# Patient Record
Sex: Male | Born: 1940 | Race: Black or African American | Hispanic: No | Smoking: Never smoker
Health system: Southern US, Community
[De-identification: ages and names within clinical notes are randomized; demographics above are authoritative.]

## PROBLEM LIST (undated history)

## (undated) DIAGNOSIS — N186 End stage renal disease: Secondary | ICD-10-CM

## (undated) DIAGNOSIS — D696 Thrombocytopenia, unspecified: Secondary | ICD-10-CM

## (undated) DIAGNOSIS — I10 Essential (primary) hypertension: Secondary | ICD-10-CM

## (undated) DIAGNOSIS — E119 Type 2 diabetes mellitus without complications: Secondary | ICD-10-CM

## (undated) DIAGNOSIS — C801 Malignant (primary) neoplasm, unspecified: Secondary | ICD-10-CM

## (undated) DIAGNOSIS — N2889 Other specified disorders of kidney and ureter: Secondary | ICD-10-CM

## (undated) DIAGNOSIS — N2581 Secondary hyperparathyroidism of renal origin: Secondary | ICD-10-CM

## (undated) DIAGNOSIS — G4730 Sleep apnea, unspecified: Secondary | ICD-10-CM

## (undated) DIAGNOSIS — M109 Gout, unspecified: Secondary | ICD-10-CM

## (undated) DIAGNOSIS — E782 Mixed hyperlipidemia: Secondary | ICD-10-CM

## (undated) DIAGNOSIS — D649 Anemia, unspecified: Secondary | ICD-10-CM

## (undated) DIAGNOSIS — M1990 Unspecified osteoarthritis, unspecified site: Secondary | ICD-10-CM

## (undated) DIAGNOSIS — E876 Hypokalemia: Secondary | ICD-10-CM

## (undated) DIAGNOSIS — K219 Gastro-esophageal reflux disease without esophagitis: Secondary | ICD-10-CM

## (undated) DIAGNOSIS — N189 Chronic kidney disease, unspecified: Secondary | ICD-10-CM

## (undated) HISTORY — PX: OTHER SURGICAL HISTORY: SHX169

## (undated) HISTORY — DX: Unspecified osteoarthritis, unspecified site: M19.90

## (undated) HISTORY — PX: EYE SURGERY: SHX253

## (undated) HISTORY — PX: APPENDECTOMY: SHX54

## (undated) HISTORY — PX: RETINAL DETACHMENT SURGERY: SHX105

## (undated) HISTORY — PX: TRANSURETHRAL RESECTION OF PROSTATE: SHX73

## (undated) HISTORY — PX: HERNIA REPAIR: SHX51

---

## 2004-10-12 ENCOUNTER — Ambulatory Visit: Payer: Self-pay

## 2005-02-08 ENCOUNTER — Ambulatory Visit: Payer: Self-pay | Admitting: Cardiology

## 2006-09-20 ENCOUNTER — Other Ambulatory Visit: Payer: Self-pay

## 2006-09-26 ENCOUNTER — Ambulatory Visit: Payer: Self-pay | Admitting: Podiatry

## 2008-09-01 ENCOUNTER — Ambulatory Visit: Payer: Self-pay | Admitting: Internal Medicine

## 2008-09-29 ENCOUNTER — Ambulatory Visit: Payer: Self-pay | Admitting: Internal Medicine

## 2010-05-06 ENCOUNTER — Ambulatory Visit: Payer: Self-pay | Admitting: Gastroenterology

## 2010-05-09 LAB — PATHOLOGY REPORT

## 2012-06-06 ENCOUNTER — Ambulatory Visit: Payer: Self-pay | Admitting: Family Medicine

## 2012-10-11 ENCOUNTER — Inpatient Hospital Stay: Payer: Self-pay | Admitting: Surgery

## 2012-10-11 LAB — CBC
MCHC: 32.7 g/dL (ref 32.0–36.0)
MCV: 90 fL (ref 80–100)
Platelet: 107 10*3/uL — ABNORMAL LOW (ref 150–440)
RDW: 15.2 % — ABNORMAL HIGH (ref 11.5–14.5)
WBC: 8.9 10*3/uL (ref 3.8–10.6)

## 2012-10-11 LAB — URINALYSIS, COMPLETE
Bilirubin,UR: NEGATIVE
Glucose,UR: NEGATIVE mg/dL (ref 0–75)
Hyaline Cast: 5
Leukocyte Esterase: NEGATIVE
Ph: 5 (ref 4.5–8.0)
RBC,UR: 5 /HPF (ref 0–5)
Specific Gravity: 1.018 (ref 1.003–1.030)
Squamous Epithelial: 1
WBC UR: 4 /HPF (ref 0–5)

## 2012-10-11 LAB — COMPREHENSIVE METABOLIC PANEL
Albumin: 3.1 g/dL — ABNORMAL LOW (ref 3.4–5.0)
BUN: 38 mg/dL — ABNORMAL HIGH (ref 7–18)
Chloride: 105 mmol/L (ref 98–107)
Co2: 23 mmol/L (ref 21–32)
Creatinine: 3.37 mg/dL — ABNORMAL HIGH (ref 0.60–1.30)
EGFR (African American): 20 — ABNORMAL LOW
Potassium: 4.3 mmol/L (ref 3.5–5.1)
SGPT (ALT): 13 U/L (ref 12–78)
Total Protein: 8.3 g/dL — ABNORMAL HIGH (ref 6.4–8.2)

## 2012-10-11 LAB — LIPASE, BLOOD: Lipase: 84 U/L (ref 73–393)

## 2012-10-12 LAB — BASIC METABOLIC PANEL
BUN: 40 mg/dL — ABNORMAL HIGH (ref 7–18)
Creatinine: 2.84 mg/dL — ABNORMAL HIGH (ref 0.60–1.30)
EGFR (African American): 25 — ABNORMAL LOW
EGFR (Non-African Amer.): 21 — ABNORMAL LOW
Osmolality: 292 (ref 275–301)
Potassium: 4.4 mmol/L (ref 3.5–5.1)

## 2012-10-12 LAB — CBC WITH DIFFERENTIAL/PLATELET
Basophil %: 0.1 %
Eosinophil #: 0 10*3/uL (ref 0.0–0.7)
Eosinophil %: 0 %
HCT: 33.2 % — ABNORMAL LOW (ref 40.0–52.0)
MCH: 28.5 pg (ref 26.0–34.0)
MCHC: 31.5 g/dL — ABNORMAL LOW (ref 32.0–36.0)
MCV: 91 fL (ref 80–100)
Monocyte %: 5.2 %
Neutrophil #: 5.4 10*3/uL (ref 1.4–6.5)
Neutrophil %: 92.8 %
Platelet: 88 10*3/uL — ABNORMAL LOW (ref 150–440)
RBC: 3.67 10*6/uL — ABNORMAL LOW (ref 4.40–5.90)
RDW: 15.1 % — ABNORMAL HIGH (ref 11.5–14.5)
WBC: 5.8 10*3/uL (ref 3.8–10.6)

## 2012-10-12 LAB — MAGNESIUM: Magnesium: 1.3 mg/dL — ABNORMAL LOW

## 2012-10-13 LAB — BASIC METABOLIC PANEL
Anion Gap: 11 (ref 7–16)
BUN: 41 mg/dL — ABNORMAL HIGH (ref 7–18)
Chloride: 109 mmol/L — ABNORMAL HIGH (ref 98–107)
Co2: 19 mmol/L — ABNORMAL LOW (ref 21–32)
EGFR (African American): 26 — ABNORMAL LOW
EGFR (Non-African Amer.): 23 — ABNORMAL LOW
Glucose: 214 mg/dL — ABNORMAL HIGH (ref 65–99)
Osmolality: 294 (ref 275–301)
Potassium: 4.6 mmol/L (ref 3.5–5.1)

## 2012-10-13 LAB — MAGNESIUM: Magnesium: 1.5 mg/dL — ABNORMAL LOW

## 2012-10-13 LAB — CBC WITH DIFFERENTIAL/PLATELET
Basophil #: 0 10*3/uL (ref 0.0–0.1)
Eosinophil %: 0.1 %
MCH: 29.2 pg (ref 26.0–34.0)
MCHC: 32.5 g/dL (ref 32.0–36.0)
MCV: 90 fL (ref 80–100)
Monocyte #: 0.4 x10 3/mm (ref 0.2–1.0)
Platelet: 96 10*3/uL — ABNORMAL LOW (ref 150–440)
WBC: 8.1 10*3/uL (ref 3.8–10.6)

## 2012-10-14 LAB — COMPREHENSIVE METABOLIC PANEL
Alkaline Phosphatase: 84 U/L (ref 50–136)
Anion Gap: 10 (ref 7–16)
BUN: 44 mg/dL — ABNORMAL HIGH (ref 7–18)
Chloride: 111 mmol/L — ABNORMAL HIGH (ref 98–107)
Co2: 20 mmol/L — ABNORMAL LOW (ref 21–32)
Creatinine: 2.54 mg/dL — ABNORMAL HIGH (ref 0.60–1.30)
SGOT(AST): 15 U/L (ref 15–37)

## 2012-10-14 LAB — CBC WITH DIFFERENTIAL/PLATELET
HGB: 10.9 g/dL — ABNORMAL LOW (ref 13.0–18.0)
Lymphocyte #: 0.3 10*3/uL — ABNORMAL LOW (ref 1.0–3.6)
Lymphocyte %: 2.8 %
MCH: 28.2 pg (ref 26.0–34.0)
MCHC: 31.6 g/dL — ABNORMAL LOW (ref 32.0–36.0)
RDW: 15.5 % — ABNORMAL HIGH (ref 11.5–14.5)

## 2012-10-15 LAB — PATHOLOGY REPORT

## 2012-10-17 LAB — CULTURE, BLOOD (SINGLE)

## 2012-12-10 ENCOUNTER — Ambulatory Visit: Payer: Self-pay | Admitting: Internal Medicine

## 2012-12-10 LAB — CBC CANCER CENTER
HCT: 29.6 % — ABNORMAL LOW (ref 40.0–52.0)
HGB: 9.8 g/dL — ABNORMAL LOW (ref 13.0–18.0)
Lymphocytes: 23 %
MCH: 28.2 pg (ref 26.0–34.0)
MCHC: 33 g/dL (ref 32.0–36.0)
MCV: 86 fL (ref 80–100)
Monocytes: 5 %
Platelet: 157 x10 3/mm (ref 150–440)
RBC: 3.45 10*6/uL — ABNORMAL LOW (ref 4.40–5.90)

## 2012-12-10 LAB — IRON AND TIBC
Iron Bind.Cap.(Total): 277 ug/dL (ref 250–450)
Iron Saturation: 21 %
Unbound Iron-Bind.Cap.: 218 ug/dL

## 2012-12-10 LAB — LACTATE DEHYDROGENASE: LDH: 126 U/L (ref 85–241)

## 2012-12-10 LAB — RETICULOCYTES: Reticulocyte: 1.56 %

## 2012-12-10 LAB — FOLATE: Folic Acid: 10.3 ng/mL (ref 3.1–100.0)

## 2012-12-10 LAB — FERRITIN: Ferritin (ARMC): 520 ng/mL — ABNORMAL HIGH (ref 8–388)

## 2012-12-11 LAB — PROT IMMUNOELECTROPHORES(ARMC)

## 2012-12-19 ENCOUNTER — Ambulatory Visit: Payer: Self-pay | Admitting: Internal Medicine

## 2013-01-19 ENCOUNTER — Ambulatory Visit: Payer: Self-pay | Admitting: Internal Medicine

## 2013-03-21 ENCOUNTER — Ambulatory Visit: Payer: Self-pay | Admitting: Internal Medicine

## 2013-03-28 LAB — CBC CANCER CENTER
Basophil %: 0.9 %
Eosinophil #: 0.5 x10 3/mm (ref 0.0–0.7)
Eosinophil %: 7.7 %
HCT: 32.6 % — ABNORMAL LOW (ref 40.0–52.0)
HGB: 11.2 g/dL — ABNORMAL LOW (ref 13.0–18.0)
Lymphocyte #: 1.2 x10 3/mm (ref 1.0–3.6)
Lymphocyte %: 19 %
MCHC: 34.4 g/dL (ref 32.0–36.0)
MCV: 89 fL (ref 80–100)
Monocyte #: 0.6 x10 3/mm (ref 0.2–1.0)
Monocyte %: 9.1 %
Neutrophil %: 63.3 %
RBC: 3.66 10*6/uL — ABNORMAL LOW (ref 4.40–5.90)
RDW: 13.9 % (ref 11.5–14.5)
WBC: 6.2 x10 3/mm (ref 3.8–10.6)

## 2013-04-21 ENCOUNTER — Ambulatory Visit: Payer: Self-pay | Admitting: Internal Medicine

## 2013-06-27 ENCOUNTER — Ambulatory Visit: Payer: Self-pay | Admitting: Oncology

## 2013-06-27 LAB — CBC CANCER CENTER
Basophil #: 0 x10 3/mm (ref 0.0–0.1)
Eosinophil #: 0.3 x10 3/mm (ref 0.0–0.7)
Eosinophil %: 4.3 %
Lymphocyte %: 18.7 %
MCH: 29.8 pg (ref 26.0–34.0)
MCHC: 32.9 g/dL (ref 32.0–36.0)
MCV: 91 fL (ref 80–100)
Neutrophil #: 4.3 x10 3/mm (ref 1.4–6.5)
RBC: 3.4 10*6/uL — ABNORMAL LOW (ref 4.40–5.90)
RDW: 14.4 % (ref 11.5–14.5)

## 2013-07-21 ENCOUNTER — Ambulatory Visit: Payer: Self-pay | Admitting: Oncology

## 2013-09-25 ENCOUNTER — Ambulatory Visit: Payer: Self-pay | Admitting: Internal Medicine

## 2013-09-26 LAB — CBC CANCER CENTER
Basophil #: 0 x10 3/mm (ref 0.0–0.1)
Basophil %: 0.5 %
Eosinophil #: 0.3 x10 3/mm (ref 0.0–0.7)
Eosinophil %: 4 %
HCT: 33.1 % — ABNORMAL LOW (ref 40.0–52.0)
HGB: 10.8 g/dL — AB (ref 13.0–18.0)
LYMPHS ABS: 1.2 x10 3/mm (ref 1.0–3.6)
Lymphocyte %: 19.1 %
MCH: 29.2 pg (ref 26.0–34.0)
MCHC: 32.7 g/dL (ref 32.0–36.0)
MCV: 89 fL (ref 80–100)
MONO ABS: 0.5 x10 3/mm (ref 0.2–1.0)
Monocyte %: 8.6 %
Neutrophil #: 4.2 x10 3/mm (ref 1.4–6.5)
Neutrophil %: 67.8 %
Platelet: 118 x10 3/mm — ABNORMAL LOW (ref 150–440)
RBC: 3.72 10*6/uL — AB (ref 4.40–5.90)
RDW: 14.1 % (ref 11.5–14.5)
WBC: 6.2 x10 3/mm (ref 3.8–10.6)

## 2013-09-26 LAB — FERRITIN: Ferritin (ARMC): 307 ng/mL (ref 8–388)

## 2013-09-26 LAB — IRON AND TIBC
IRON BIND. CAP.(TOTAL): 261 ug/dL (ref 250–450)
IRON SATURATION: 30 %
IRON: 79 ug/dL (ref 65–175)
Unbound Iron-Bind.Cap.: 182 ug/dL

## 2013-10-19 ENCOUNTER — Ambulatory Visit: Payer: Self-pay | Admitting: Internal Medicine

## 2013-12-25 ENCOUNTER — Ambulatory Visit: Payer: Self-pay | Admitting: Internal Medicine

## 2013-12-26 LAB — CBC CANCER CENTER
Basophil #: 0.1 x10 3/mm (ref 0.0–0.1)
Basophil %: 0.9 %
EOS PCT: 7.8 %
Eosinophil #: 0.5 x10 3/mm (ref 0.0–0.7)
HCT: 32.5 % — ABNORMAL LOW (ref 40.0–52.0)
HGB: 10.7 g/dL — AB (ref 13.0–18.0)
LYMPHS ABS: 1.2 x10 3/mm (ref 1.0–3.6)
LYMPHS PCT: 18.3 %
MCH: 29.6 pg (ref 26.0–34.0)
MCHC: 32.9 g/dL (ref 32.0–36.0)
MCV: 90 fL (ref 80–100)
MONOS PCT: 8.9 %
Monocyte #: 0.6 x10 3/mm (ref 0.2–1.0)
Neutrophil #: 4.2 x10 3/mm (ref 1.4–6.5)
Neutrophil %: 64.1 %
Platelet: 142 x10 3/mm — ABNORMAL LOW (ref 150–440)
RBC: 3.61 10*6/uL — ABNORMAL LOW (ref 4.40–5.90)
RDW: 13.9 % (ref 11.5–14.5)
WBC: 6.6 x10 3/mm (ref 3.8–10.6)

## 2013-12-26 LAB — IRON AND TIBC
Iron Bind.Cap.(Total): 234 ug/dL — ABNORMAL LOW (ref 250–450)
Iron Saturation: 33 %
Iron: 77 ug/dL (ref 65–175)
UNBOUND IRON-BIND. CAP.: 157 ug/dL

## 2013-12-26 LAB — FERRITIN: Ferritin (ARMC): 286 ng/mL (ref 8–388)

## 2014-01-19 ENCOUNTER — Ambulatory Visit: Payer: Self-pay | Admitting: Internal Medicine

## 2014-12-11 NOTE — Discharge Summary (Signed)
PATIENT NAME:  [name] [name], [name] [name] MR#:  [id number] DATE OF BIRTH:  01/13/1941  DATE OF ADMISSION:  10/11/2012 DATE OF DISCHARGE:  10/14/2012  DICTATING PHYSICIAN: Harrell Gave A. Lundquist, MD  DISCHARGE DIAGNOSES:  1. Ruptured appendicitis status post laparoscopic appendectomy.  2. History of acute on chronic renal failure.  3. History of umbilical hernia repair.  4. History of transurethral resection of prostate.  5. History of left retinal detachment surgery.  6. History of hypertension.  7. History of morbid obesity.  8. History of gout.  9. History of dyslipidemia.  10. Adult onset diabetes mellitus.  11. History of obstructive sleep apnea.  12. History of pelvic kidney.   DISCHARGE MEDICATIONS: As follows: 1. Metoprolol 50 mg p.o. b.i.d.  2. Glimepiride 2 mg p.o. daily.  3. Dutasteride 0.5 mg p.o. daily.  4. Aspirin 81 p.o. daily. 5. Norvasc 10 mg p.o. daily.  6. Loratadine 10 mg p.o. daily.  7. Lovastatin 20 mg p.o. at bedtime.  8. Vitamin D 2000 units p.o. daily.  9. Febuxostat 40 mg p.o. daily.  10. Victoza 1.8 mcg subcutaneous p.r.n. 11. Vicodin 5/325 mg p.o. q.4 hours  12. Cipro 500 mg p.o. b.i.d.  13. Metronidazole 500 mg p.o. q.8 hours.   INDICATION FOR ADMISSION: As follows: The patient is a pleasant 74 year old male who presented with I believe 4 days of right lower quadrant pain, had a leukocytosis and a CT scan which showed a perforated appendicitis. Thus, he was admitted for management of that.   HOSPITAL COURSE: As follows: The patient was brought to the operating room suite. Laparoscopic appendectomy was performed. Postoperatively, the patient was made n.p.o. As he became hungry, his diet was advanced from clears to regular diet. He also was initially made n.p.o. and given IV fluids. He was again on sliding scale insulin postoperatively and was advanced to his oral hypoglycemics as his diet improved. He also was given initially IV antibiotics, and at the time of  discharge, was tolerating regular diet with good p.o. pain control and was on oral antibiotics.   DISCHARGE INSTRUCTIONS: As follows: The patient is to follow up in approximately 1 week with myself or my colleagues at [facility]. He is to call or return to the ED if has increased pain, nausea, vomiting, redness or drainage from incision.     ____________________________ Glena Norfolk Lundquist, MD cal:OSi D: 10/24/2012 10:50:36 ET T: 10/24/2012 11:13:49 ET JOB#: [id number]  cc: Harrell Gave A. Lundquist, MD, <Dictator> Floyde Parkins MD ELECTRONICALLY SIGNED 10/26/2012 11:09

## 2014-12-11 NOTE — Consult Note (Signed)
PATIENT NAME:  [name] [name], [name] [name] MR#:  [id number] DATE OF BIRTH:  26-Mar-1941  DATE OF CONSULTATION:  10/12/2012  REFERRING PHYSICIAN:  Dr. Molly Maduro  CONSULTING PHYSICIAN:  Ceasar Lund. Anselm Jungling, MD  PRIMARY CARE PHYSICIAN:  Dr. Netty Starring with [facility].    REASON FOR THE MEDICAL CONSULTATION:  Management of diabetes, hypertension, and medical issues.   CHIEF COMPLAINT:  Status post appendectomy.   HISTORY OF PRESENT ILLNESS:  The patient is a 74 year old male with a past medical history of hypertension, hyperlipidemia, diabetes and chronic renal failure was in his usual state of health up to 3 to 4 days ago.  Since then he started having right lower quadrant abdominal pain which was gradually worsening and so severe that he had to come to the Emergency Room yesterday. CT scan of the abdomen and pelvis, which was done in the ER, was finding consistent with appendicitis with rupture.  Surgical consultation recommended.  There is evidence of fluid associated with the finding without definite evidence for focal loculated fluid collection to suggest abscess.  Indeterminate masses within patient's belly and left kidney as described above, likely reactive small bowel ileus.  After having this finding on the CAT scan, patient was admitted for surgical service and he underwent laparoscopic appendectomy today.  Medical consult is called in.  The patient denies any complaint other than mild soreness of the abdomen.    REVIEW OF SYSTEMS:  CONSTITUTIONAL:  Negative for fever, fatigue. Having severe abdominal pain for the last 2 to 3 days, but denies any weight loss or weight gain.  EYES:  No blurring or double vision.  No discharge or redness.  EARS:  No tinnitus, hearing loss.  RESPIRATORY:  No wheezing, shortness of breath or cough.  CARDIOVASCULAR:  No palpitations, chest pain, dizziness or edema of the legs.  ABDOMEN:  Mild abdominal pain, status post laparoscopic appendectomy.  No  diarrhea or vomiting.  SKIN:  No rashes.  JOINTS:  No swelling or pain.  NEUROLOGICAL:  Denies any tingling, numbness, weakness or headache.  PSYCHIATRIC:  Denies any depression, bipolar or insomnia.   PAST MEDICAL HISTORY:  Hypertension, diabetes, hyperlipidemia, gout and chronic renal failure.   PAST SURGICAL HISTORY:  Hernia repair surgery, left toe amputation.   SOCIAL HISTORY:  Denies smoking, drinking alcohol or any drug use.  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   FAMILY HISTORY:  Mother had diabetes.  Father and brother both had leukemia.   HOME MEDICATIONS:  Vitamin D3 2000 international units once a day, Victoza 1.8 mcg subcutaneous once a day as needed, metoprolol 50 mg oral tablet 2 times a day, lovastatin 20 mg oral tablet once a day, loratadine 10 mg oral tablet once a day, glimepiride 4 mg oral tablet tame 0.5 tablet orally once a day, febuxostat 40 mg oral once a day, Enalapril 20 mg oral tablet once a day, budesonide 0.5 mg oral tablet once a day, aspirin enteric-coated 81 mg once a day, amlodipine 10 mg oral tablet once a day.  PHYSICAL EXAMINATION: VITAL SIGNS:  Temperature 98 degrees, pulse 102, respirations 20, blood pressure 144/80 and pulse ox 97% at 1 liter nasal cannula oxygen supplementation.  GENERAL:  The patient is completely alert, oriented to time, place and person.  Does not appear in any acute distress.  He is cooperative with history-taking and physical examination.  HEENT:  Head and neck atraumatic.  Conjunctivae pink.  Oral mucosa moist.  RESPIRATORY:  Bilateral clear and equal air entry.  CARDIOVASCULAR:  S1, S2 present, regular.  No murmur appreciated.  ABDOMEN:  Small dressings present at 3 sites for laparoscopic appendectomy surgery.  Mild tenderness.  No organomegaly.  Bowel sounds present.  SKIN:  No rashes.  LYMPHATIC:  No edema.  NEUROLOGICAL:  Moves all four limbs.  Power 5 out of 5.  No tingling or numbness.   PSYCHIATRIC:  Does not appear in any acute psychiatric illness.     LABORATORY AND IMAGING DATA:  CT scan of the abdomen as mentioned above.  Other lab results:  Glucose 245, BUN 40, creatinine 2.84, which was 3.37 on admission.  Sodium 137, potassium 4.4, chloride 107, bicarb 19, calcium 8.1, magnesium 1.3.  WBC 5.8, hemoglobin 10.5, platelet count 88, MCV 91.  Blood cultures, no growth.  Urinalysis grossly negative with 4 WBCs present in the urine.   ASSESSMENT AND PLAN:  The patient is a 74 year old male with past medical history of hypertension, diabetes, hyperlipidemia and chronic renal failure with pelvic kidney came with acute appendicitis status post appendectomy.  Medical consult for medical issue management.  1.  Diabetes.  The patient is currently nothing by mouth status post surgery.  We will monitor with sliding insulin scale coverage and we may start the home medication once patient starts eating in 1 to 2 days.  2.  Hypertension.  He is on amlodipine, metoprolol.  We held Enalapril due to renal failure.  We will check creatinine in the morning and follow blood pressure.  3.  Hyperlipidemia.  We will continue statin.  4.  Chronic renal failure.  The patient had one pelvic kidney and kidney has some masses also.  We do not have previous renal function for comparison.  We may need to check it.  I spoke to [facility] doctor.  They might have this patient's records from the past and that will help.  Meanwhile, we will recheck the kidney function tomorrow morning.  5.  Hypomagnesemia.  We will replace and recheck in the morning.   CODE STATUS:  FULL CODE.     Primary care physician, Dr. Netty Starring of [facility].  I endorsed the case to Dr. Doy Hutching who is covering for [facility] tonight.   TOTAL TIME SPENT:  50 minutes.    ____________________________ Ceasar Lund Anselm Jungling, MD vgv:ea D: 10/12/2012 23:10:04 ET T: 10/13/2012 02:34:10 ET JOB#: [id number]  cc: Ceasar Lund. Anselm Jungling,  MD, <Dictator> Vaughan Basta MD ELECTRONICALLY SIGNED 10/20/2012 23:06

## 2014-12-11 NOTE — Op Note (Signed)
PATIENT NAME:  [name] [name], [name] [name] MR#:  [id number] DATE OF BIRTH:  [birth year]  DATE OF PROCEDURE:  10/11/2012  PREOPERATIVE DIAGNOSIS: Ruptured appendicitis.   POSTOPERATIVE DIAGNOSIS:  Acute appendicitis rupture.   PROCEDURE PERFORMED:  Laparoscopic appendectomy.   ESTIMATED BLOOD LOSS: 25 mL.   COMPLICATIONS: None.   SPECIMEN: Appendix.   INDICATION FOR SURGERY: The patient is a pleasant 74 year old male who presented with 5 days of right lower quadrant pain and a CT scan which showed ruptured appendix with intra-abdominal phlegmon. He was brought to the operating room for laparoscopic appendectomy.   DETAILS OF PROCEDURE: Informed consent was obtained. The patient brought to the operating room suite. He was induced. Endotracheal tube was placed. General anesthesia was administered. His abdomen was prepped and draped in standard surgical fashion. An upper midline incision was made above his area where there is some firmness likely corresponding to his previous hernia repair. This brought down to the fascia. The fascia was grasped. Fascia was incised. The peritoneum was entered. There were no intraperitoneal adhesions. 2-0 Vicryl stay sutures were placed. Hasson trocar was placed in the abdomen. The abdomen was insufflated. There was a large amount of inflammation with multiple adhesions between adjacent loops of small bowel as well as free purulence. I then placed a left lower quadrant 5 mm trocar and brought the adhesions off the abdominal wall. I encountered multiple pockets of purulence. I then placed a lower abdomen midline trocar 5 mm again and then able to find the appendix. It was severely inflamed, ruptured, not quite at the base, approximately 1 cm from the base. I then placed a right lower quadrant trocar and using a combination of 3 trocars made a hole at the base of the appendix and fired a stapler across the base of the appendix. It was flush with the cecum. I then used a combination of  cautery and  endoscopic staplers to divide mesoappendix. The appendix was then brought out through an Endo Catch bag through the supraumbilical port. I then irrigated the abdomen with 3 to 4 liters of normal saline until everything was clear. I then examined the mesentery as well as the appendiceal stump, both were hemostatic. I then removed my trocars under direct visualization. The supraumbilical Hasson trocar fascia was closed with a figure-of-eight 0 Vicryl suture. The skin was then closed with interrupted deep dermal 4-0 Monocryl. Steri-Strips were placed across the wounds and then Telfa and Tegaderm were used to complete the dressing. The patient was then awoken, extubated and brought to the postanesthesia care unit. There were no immediate complications. Needle, sponge and instrument counts were correct at the end of the procedure.   ____________________________ Glena Norfolk. Briscoe Daniello, MD cal:ja D: 10/11/2012 [id number] ET T: 10/12/2012 11:33:04 ET JOB#: [id number]  cc: Harrell Gave A. Twain Stenseth, MD, <Dictator> Floyde Parkins MD ELECTRONICALLY SIGNED 10/15/2012 8:17

## 2014-12-11 NOTE — H&P (Signed)
Subjective/Chief Complaint progressively worsening abdominal pain   History of Present Illness 71 yobm began having abdominal pain Sunday (5 days ago). It has gotten progressively worse. No fever. Had some nausea one time and vomitied once last PM. Has been eating normally except yesterday and today, when his appetite has decreased. Drinking fluids normally and normal urinary volume by Hx. Urine has turned darker.   Past History HTN CRI (Cr 2.0 this month, followed by [facility]) [place] Hx Gout Dyslipidemia AODM OSA Erectile dysfunction Pelvic (Right) Kidney  s/p UHR - doesn't know about mesh s/p TURP s/p L retinal detachment surgery   ALLERGIES:  No Known Allergies:    Other Allergies cardura    Medications meds not entered for med rec - therefore not done yet - on 11 meds - no anti-coagulants or anti-platelet agents   Family and Social History:  Family History Non-Contributory   Social History negative tobacco, negative ETOH, negative Illicit drugs, married, retired Paediatric nurse of [employer]:  Fever/Chills No   Cough No   Sputum No   Abdominal Pain Yes   Diarrhea No   Constipation No   Nausea/Vomiting Yes   SOB/DOE No   Chest Pain No   Dysuria No   Tolerating PT Yes   Tolerating Diet Yes  Nauseated  Vomiting   Physical Exam:  GEN well developed, obese   HEENT pink conjunctivae, PERRL, hearing intact to voice, moist oral mucosa, Oropharynx clear   NECK supple  thyroid not tender  trachea midline   RESP normal resp effort  clear BS  no use of accessory muscles   CARD regular rate  no murmur  no JVD  no Rub   ABD positive tenderness  exquisite generalized tenderness with rebound and guarding - seems worse on Right   LYMPH negative neck   EXTR negative edema   SKIN normal to palpation, skin turgor good   NEURO cranial nerves intact, negative tremor, follows commands, strength:, motor/sensory function intact    PSYCH alert, A+O to time, place, person, poor insight   Lab Results: Hepatic:  21-Feb-14 14:21   Bilirubin, Total 0.7  Alkaline Phosphatase 62  SGPT (ALT) 13  SGOT (AST)  14  Total Protein, Serum  8.3  Albumin, Serum  3.1  Routine Chem:  21-Feb-14 14:21   Lipase 84 (Result(s) reported on 11 Oct 2012 at 02:50PM.)  Glucose, Serum  257  BUN  38  Creatinine (comp)  3.37  Sodium, Serum 138  Potassium, Serum 4.3  Chloride, Serum 105  CO2, Serum 23  Calcium (Total), Serum 9.2  Osmolality (calc) 294  eGFR (African American)  20  eGFR (Non-African American)  17 (eGFR values <39m/min/1.73 m2 may be an indication of chronic kidney disease (CKD). Calculated eGFR is useful in patients with stable renal function. The eGFR calculation will not be reliable in acutely ill patients when serum creatinine is changing rapidly. It is not useful in  patients on dialysis. The eGFR calculation may not be applicable to patients at the low and high extremes of body sizes, pregnant women, and vegetarians.)  Anion Gap 10  Routine Hem:  21-Feb-14 14:21   WBC (CBC) 8.9  RBC (CBC)  4.10  Hemoglobin (CBC)  12.1  Hematocrit (CBC)  36.9  Platelet Count (CBC)  107 (Result(s) reported on 11 Oct 2012 at 02:46PM.)  MCV 90  MCH 29.4  MCHC 32.7  RDW  15.2   Radiology Results: LabUnknown:  21-Feb-14 16:04, CT Abdomen and Pelvis Without Contrast  PACS Image  CT:  CT Abdomen and Pelvis Without Contrast  REASON FOR EXAM:    (1) severe abdominal pain; (2) severe abdominal pian  COMMENTS:   May transport without cardiac monitor    PROCEDURE: CT  - CT ABDOMEN AND PELVIS W0  - Oct 11 2012  4:04PM     RESULT: CT abdomen pelvis dated 10/11/2012.    Technique: Helical 3 mm sections were obtained from lung bases through   the pubic symphysis status post ministration of oral contrast.    Findings: Areas of increased density project within the lung bases.        Noncontrast evaluation of the liver,  spleen, adrenals, pancreas are     unremarkable. Evaluation of the left kidney demonstrates 3.2 cm masslike   area along the posterior midpole region of the kidney this area is   incompletely characterized on this study demonstrates a Hounsfield units   of 17and may represent a cyst. Further characterization with triphasic   CT and/or MRI [place] ultrasound is recommended. Is no evidence of   abdominal aortic aneurysm.    Evaluation retrocolic gutter region demonstrates fluid within the   paracolic gutterwithout evidence associated of loculated fluid   collections. Fluid is appreciated tracking along the anterior lateral   abdominal wall . A blind-ending tubular focus with small punctate areas   of air is appreciated along the anterior lower abdomen.This has the   appearance of the appendix and measures 0.46 cm in diameter. Within the   proximal portion of this focus a rounded laminated calcified density is   appreciated suspicious for an appendicolith. Small foci of air project     either an adjacent to this structure or possibly along the nondependent   wall of this structure. There is small punctate foci of air within the   surrounding fluid and these findings are concerning for a ruptured   appendix until proven otherwise. There are inflammatory change within the   surrounding mesenteric fat.    A pelvic kidney is identified within the central pelvis. A masslike area   projects laterally within the renal cortex and may simply ribs and a   dromedary hump. A mass cannot be excluded no further evaluation with MRI   the help to further characterize this finding. A second area is   identified along the lower pole region.    IMPRESSION:  Findings consistent with appendicitis as well as rupture.   Surgical consultation recommended. Dr. Reita Cliche of the emergency proximal   these findings at the time of the initial interpretation. There is     evidence of fluid associated with this finding  without definite evidence   of a focal loculated fluid collection to suggest abscess.  2. Indeterminate masses within the patient's pelvic kidney and left   kidney as described above further evaluation recommended.  2. Likely reactive small bowel ileus.        Verified By: Mikki Santee, M.D., MD    Assessment/Admission Diagnosis Ruptured viscus with generalized peritonitis - likely appendix. No fever or WBC, but tachycardic and Cr worse than baseline (3.3 instead of 2.0)   Plan IVF hydration, Ceftriaxone and Clindamycin  Appendectomy and washout   Electronic Signatures: Consuela Mimes (MD)  (Signed 21-Feb-14 17:58)  Authored: CHIEF COMPLAINT and HISTORY, ALLERGIES, Other Allergies, OTHER MEDICATIONS, FAMILY AND SOCIAL HISTORY, REVIEW OF SYSTEMS, PHYSICAL EXAM, LABS, Radiology, ASSESSMENT AND PLAN   Last  Updated: 21-Feb-14 17:58 by Consuela Mimes (MD)

## 2015-06-11 ENCOUNTER — Encounter: Payer: Self-pay | Admitting: *Deleted

## 2015-06-14 ENCOUNTER — Ambulatory Visit: Payer: Medicare Other | Admitting: Certified Registered Nurse Anesthetist

## 2015-06-14 ENCOUNTER — Ambulatory Visit
Admission: RE | Admit: 2015-06-14 | Discharge: 2015-06-14 | Disposition: A | Discharge status: Home / Self Care | Payer: Medicare Other | Source: Ambulatory Visit | Attending: Gastroenterology | Admitting: Gastroenterology

## 2015-06-14 ENCOUNTER — Encounter
Admission: RE | Disposition: A | Discharge status: Home / Self Care | Payer: Self-pay | Source: Ambulatory Visit | Attending: Gastroenterology

## 2015-06-14 ENCOUNTER — Encounter: Payer: Self-pay | Admitting: *Deleted

## 2015-06-14 DIAGNOSIS — N184 Chronic kidney disease, stage 4 (severe): Secondary | ICD-10-CM | POA: Diagnosis not present

## 2015-06-14 DIAGNOSIS — G4730 Sleep apnea, unspecified: Secondary | ICD-10-CM | POA: Insufficient documentation

## 2015-06-14 DIAGNOSIS — M109 Gout, unspecified: Secondary | ICD-10-CM | POA: Insufficient documentation

## 2015-06-14 DIAGNOSIS — Z7982 Long term (current) use of aspirin: Secondary | ICD-10-CM | POA: Insufficient documentation

## 2015-06-14 DIAGNOSIS — E1122 Type 2 diabetes mellitus with diabetic chronic kidney disease: Secondary | ICD-10-CM | POA: Diagnosis not present

## 2015-06-14 DIAGNOSIS — Z79899 Other long term (current) drug therapy: Secondary | ICD-10-CM | POA: Diagnosis not present

## 2015-06-14 DIAGNOSIS — I129 Hypertensive chronic kidney disease with stage 1 through stage 4 chronic kidney disease, or unspecified chronic kidney disease: Secondary | ICD-10-CM | POA: Diagnosis not present

## 2015-06-14 DIAGNOSIS — Z888 Allergy status to other drugs, medicaments and biological substances status: Secondary | ICD-10-CM | POA: Insufficient documentation

## 2015-06-14 DIAGNOSIS — K429 Umbilical hernia without obstruction or gangrene: Secondary | ICD-10-CM | POA: Diagnosis not present

## 2015-06-14 DIAGNOSIS — Z86010 Personal history of colonic polyps: Secondary | ICD-10-CM | POA: Insufficient documentation

## 2015-06-14 DIAGNOSIS — K5730 Diverticulosis of large intestine without perforation or abscess without bleeding: Secondary | ICD-10-CM | POA: Diagnosis not present

## 2015-06-14 DIAGNOSIS — Z1211 Encounter for screening for malignant neoplasm of colon: Secondary | ICD-10-CM | POA: Diagnosis present

## 2015-06-14 DIAGNOSIS — E782 Mixed hyperlipidemia: Secondary | ICD-10-CM | POA: Insufficient documentation

## 2015-06-14 DIAGNOSIS — D124 Benign neoplasm of descending colon: Secondary | ICD-10-CM | POA: Diagnosis not present

## 2015-06-14 HISTORY — DX: Anemia, unspecified: D64.9

## 2015-06-14 HISTORY — DX: Essential (primary) hypertension: I10

## 2015-06-14 HISTORY — PX: COLONOSCOPY WITH PROPOFOL: SHX5780

## 2015-06-14 HISTORY — DX: Gout, unspecified: M10.9

## 2015-06-14 HISTORY — DX: Mixed hyperlipidemia: E78.2

## 2015-06-14 HISTORY — DX: Type 2 diabetes mellitus without complications: E11.9

## 2015-06-14 HISTORY — DX: Sleep apnea, unspecified: G47.30

## 2015-06-14 HISTORY — DX: Chronic kidney disease, unspecified: N18.9

## 2015-06-14 LAB — GLUCOSE, CAPILLARY: Glucose-Capillary: 107 mg/dL — ABNORMAL HIGH (ref 65–99)

## 2015-06-14 SURGERY — COLONOSCOPY WITH PROPOFOL
Anesthesia: General

## 2015-06-14 MED ORDER — SODIUM CHLORIDE 0.9 % IV SOLN
INTRAVENOUS | Status: DC
  Administered 2015-06-14: 11:00:00 via INTRAVENOUS

## 2015-06-14 MED ORDER — PROPOFOL 10 MG/ML IV BOLUS
INTRAVENOUS | Status: DC | PRN
  Administered 2015-06-14: 50 mg via INTRAVENOUS

## 2015-06-14 MED ORDER — FENTANYL CITRATE (PF) 100 MCG/2ML IJ SOLN
INTRAMUSCULAR | Status: DC | PRN
  Administered 2015-06-14: 50 ug via INTRAVENOUS

## 2015-06-14 MED ORDER — LIDOCAINE HCL (CARDIAC) 20 MG/ML IV SOLN
INTRAVENOUS | Status: DC | PRN
Start: 2015-06-14 — End: 2015-06-14
  Administered 2015-06-14: 50 mg via INTRAVENOUS

## 2015-06-14 MED ORDER — PROPOFOL 500 MG/50ML IV EMUL
INTRAVENOUS | Status: DC | PRN
  Administered 2015-06-14: 150 ug/kg/min via INTRAVENOUS

## 2015-06-14 NOTE — Anesthesia Preprocedure Evaluation (Signed)
Anesthesia Evaluation  Patient identified by MRN, date of birth, ID band Patient awake    Reviewed: Allergy & Precautions, H&P , NPO status , Patient's Chart, lab work & pertinent test results, reviewed documented beta blocker date and time   History of Anesthesia Complications Negative for: history of anesthetic complications  Airway Mallampati: IV  TM Distance: >3 FB Neck ROM: full    Dental no notable dental hx. (+) Teeth Intact   Pulmonary neg shortness of breath, sleep apnea , neg COPD, neg recent URI,    Pulmonary exam normal breath sounds clear to auscultation       Cardiovascular Exercise Tolerance: Good hypertension, On Medications (-) angina(-) CAD, (-) Past MI, (-) Cardiac Stents and (-) CABG Normal cardiovascular exam(-) dysrhythmias (-) Valvular Problems/Murmurs Rhythm:regular Rate:Normal     Neuro/Psych negative neurological ROS  negative psych ROS   GI/Hepatic Neg liver ROS, GERD  Controlled,  Endo/Other  negative endocrine ROSdiabetes, Oral Hypoglycemic Agents  Renal/GU CRFRenal disease  negative genitourinary   Musculoskeletal   Abdominal   Peds  Hematology  (+) Blood dyscrasia, anemia ,   Anesthesia Other Findings Past Medical History:   Anemia                                                       Diabetes mellitus without complication (HCC)                 Chronic kidney disease                                         Comment:Stage 4 CKD   Gout                                                         Sleep apnea                                                  Hypertension                                                 Hyperlipidemia, mixed                                        Reproductive/Obstetrics negative OB ROS                             Anesthesia Physical Anesthesia Plan  ASA: III  Anesthesia Plan: General   Post-op Pain Management:    Induction:    Airway Management Planned:   Additional Equipment:   Intra-op Plan:   Post-operative Plan:   Informed Consent: I have reviewed the  patients History and Physical, chart, labs and discussed the procedure including the risks, benefits and alternatives for the proposed anesthesia with the patient or authorized representative who has indicated his/her understanding and acceptance.   Dental Advisory Given  Plan Discussed with: Anesthesiologist, CRNA and Surgeon  Anesthesia Plan Comments:         Anesthesia Quick Evaluation

## 2015-06-14 NOTE — Transfer of Care (Signed)
Immediate Anesthesia Transfer of Care Note  Patient: [name] [name]  Procedure(s) Performed: Procedure(s): COLONOSCOPY WITH PROPOFOL (N/A)  Patient Location: PACU  Anesthesia Type:General  Level of Consciousness: awake, alert , oriented and patient cooperative  Airway & Oxygen Therapy: Patient Spontanous Breathing and Patient connected to nasal cannula oxygen  Post-op Assessment: Report given to RN and Post -op Vital signs reviewed and stable  Post vital signs: Reviewed and stable  Last Vitals:  Filed Vitals:   06/14/15 1206  BP: 116/69  Pulse: 77  Temp: 36 C  Resp: 12    Complications: No apparent anesthesia complications

## 2015-06-14 NOTE — H&P (Signed)
Outpatient short stay form Pre-procedure 06/14/2015 11:20 AM [name] Sails MD  Primary Physician: Dr  [name] [name]  Reason for visit:  Colonoscopy  History of present illness:  Patient is a 74 year old male presenting today for a colonoscopy. His prep well. He takes an 81 mg aspirin no other aspirin products, and no blood thinners. He stopped his aspirin about a week ago. He has a personal history of adenomatous colon polyps.    Current facility-administered medications:  .  0.9 %  sodium chloride infusion, , Intravenous, Continuous, [name] Sails, MD  Prescriptions prior to admission  Medication Sig Dispense Refill Last Dose  . amLODipine (NORVASC) 10 MG tablet Take 10 mg by mouth daily.   06/13/2015 at Unknown time  . aspirin EC 81 MG tablet Take 81 mg by mouth daily.   06/07/2015  . Cholecalciferol 2000 UNITS CAPS Take 2,000 Units by mouth daily.   Past Week at Unknown time  . enalapril (VASOTEC) 20 MG tablet Take 20 mg by mouth daily.   06/13/2015 at Unknown time  . febuxostat (ULORIC) 40 MG tablet Take 40 mg by mouth daily.   06/13/2015 at Unknown time  . finasteride (PROSCAR) 5 MG tablet Take 5 mg by mouth daily.   06/13/2015 at Unknown time  . glimepiride (AMARYL) 4 MG tablet Take 2 mg by mouth daily with breakfast.   06/13/2015 at Unknown time  . hydrALAZINE (APRESOLINE) 100 MG tablet Take 100 mg by mouth 3 (three) times daily.   06/13/2015 at Unknown time  . Liraglutide (VICTOZA [place]) Inject 1.8 mg into the skin daily.   06/13/2015 at Unknown time  . loratadine (CLARITIN) 10 MG tablet Take 10 mg by mouth daily.   Past Week at Unknown time  . lovastatin (MEVACOR) 20 MG tablet Take 20 mg by mouth at bedtime.   06/13/2015 at Unknown time  . metoprolol (LOPRESSOR) 50 MG tablet Take 50 mg by mouth 2 (two) times daily.   06/14/2015 at 0700     Allergies  Allergen Reactions  . Cardura [Doxazosin]      Past Medical History  Diagnosis Date  . Anemia   . Diabetes  mellitus without complication ([place])   . Chronic kidney disease     Stage 4 CKD  . Gout   . Sleep apnea   . Hypertension   . Hyperlipidemia, mixed     Review of systems:      Physical Exam    Heart and lungs: Regular rate and rhythm without rub or gallop, lungs are bilaterally clear    HEENT: Normocephalic atraumatic eyes are anicteric    Other:     Pertinant exam for procedure: Soft protuberant nontender nondistended bowel sounds positive normoactive. There is a midline periumbilical hernia.    Planned proceedures: Colonoscopy and indicated procedures I have discussed the risks benefits and complications of procedures to include not limited to bleeding, infection, perforation and the risk of sedation and the patient wishes to proceed.    [name] Sails, MD Gastroenterology 06/14/2015  11:20 AM

## 2015-06-14 NOTE — Op Note (Signed)
[facility]astroenterology Patient Name: [name] [name] Procedure Date: 06/14/2015 11:36 AM MRN: [id number] Account #: [id number] Date of Birth: [birth year] Admit Type: Outpatient Age: 74 Room: [facility] ENDO ROOM 3 Gender: Male Note Status: Finalized Procedure:         Colonoscopy Indications:       Personal history of colonic polyps Providers:         Lollie Sails, MD Referring MD:      Dion Body (Referring MD) Medicines:         Monitored Anesthesia Care Complications:     No immediate complications. Procedure:         Pre-Anesthesia Assessment:                    - ASA Grade Assessment: III - A patient with severe                     systemic disease.                    After obtaining informed consent, the colonoscope was                     passed under direct vision. Throughout the procedure, the                     patient's blood pressure, pulse, and oxygen saturations                     were monitored continuously. The Colonoscope was                     introduced through the anus and advanced to the the cecum,                     identified by appendiceal orifice and ileocecal valve. The                     colonoscopy was performed without difficulty. The patient                     tolerated the procedure well. The quality of the bowel                     preparation was good. Findings:      A 2 mm polyp was found in the descending colon. The polyp was sessile.       The polyp was removed with a cold biopsy forceps. Resection and       retrieval were complete.      Multiple small to medium diverticula were found in the sigmoid colon, in       the descending colon and in the transverse colon.      Biopsies for histology were taken with a cold forceps from the right       colon and left colon for evaluation of microscopic colitis.      A patchy area of mildly friable (with spontaneous bleeding) mucosa was       found in the proximal  transverse colon, in the ascending colon, in the       proximal ascending colon and in the cecum, possible mucosal-associated       barotrauma. Biopsies were taken with a cold forceps for histology.       Biopsies for histology were taken with a cold forceps  from the right       colon and left colon for evaluation of microscopic colitis.      The digital rectal exam was normal. Impression:        - One 2 mm polyp in the descending colon. Resected and                     retrieved.                    - Diverticulosis in the sigmoid colon, in the descending                     colon and in the transverse colon.                    Dionisio David (with spontaneous bleeding) mucosa in the                     proximal transverse colon, in the ascending colon, in the                     proximal ascending colon and in the cecum. Biopsied.                    - Biopsies were taken with a cold forceps from the right                     colon and left colon for evaluation of microscopic colitis. Recommendation:    - Discharge patient to home.                    - Await pathology results.                    - Telephone GI clinic for pathology results in 1 week. Procedure Code(s): --- Professional ---                    [id number], Colonoscopy, flexible; with biopsy, single or                     multiple Diagnosis Code(s): --- Professional ---                    211.3, Benign neoplasm of colon                    578.9, Hemorrhage of gastrointestinal tract, unspecified                    V12.72, Personal history of colonic polyps                    562.10, Diverticulosis of colon (without mention of                     hemorrhage) CPT copyright 2014 American Medical Association. All rights reserved. The codes documented in this report are preliminary and upon coder review may  be revised to meet current compliance requirements. Lollie Sails, MD 06/14/2015 12:03:58 PM This report has been signed  electronically. Number of Addenda: 0 Note Initiated On: 06/14/2015 11:36 AM Scope Withdrawal Time: 0 hours 10 minutes 42 seconds  Total Procedure Duration: 0 hours 16 minutes 8 seconds       [facility]

## 2015-06-15 NOTE — Anesthesia Postprocedure Evaluation (Signed)
  Anesthesia Post-op Note  Patient: [name] [name]  Procedure(s) Performed: Procedure(s): COLONOSCOPY WITH PROPOFOL (N/A)  Anesthesia type:General  Patient location: PACU  Post pain: Pain level controlled  Post assessment: Post-op Vital signs reviewed, Patient's Cardiovascular Status Stable, Respiratory Function Stable, Patent Airway and No signs of Nausea or vomiting  Post vital signs: Reviewed and stable  Last Vitals:  Filed Vitals:   06/14/15 1230  BP: 143/67  Pulse: 66  Temp:   Resp: 14    Level of consciousness: awake, alert  and patient cooperative  Complications: No apparent anesthesia complications

## 2015-06-16 LAB — SURGICAL PATHOLOGY

## 2015-06-17 ENCOUNTER — Encounter: Payer: Self-pay | Admitting: Gastroenterology

## 2015-08-11 ENCOUNTER — Other Ambulatory Visit: Payer: Self-pay | Admitting: Urology

## 2015-08-11 ENCOUNTER — Other Ambulatory Visit (HOSPITAL_COMMUNITY): Payer: Self-pay | Admitting: Urology

## 2015-08-11 DIAGNOSIS — R972 Elevated prostate specific antigen [PSA]: Secondary | ICD-10-CM

## 2015-09-01 ENCOUNTER — Ambulatory Visit (HOSPITAL_COMMUNITY)
Admission: RE | Admit: 2015-09-01 | Discharge: 2015-09-01 | Disposition: A | Discharge status: Home / Self Care | Payer: Medicare Other | Source: Ambulatory Visit | Attending: Urology | Admitting: Urology

## 2015-09-01 ENCOUNTER — Other Ambulatory Visit (HOSPITAL_COMMUNITY): Payer: Self-pay | Admitting: Urology

## 2015-09-01 DIAGNOSIS — N429 Disorder of prostate, unspecified: Secondary | ICD-10-CM | POA: Diagnosis not present

## 2015-09-01 DIAGNOSIS — R972 Elevated prostate specific antigen [PSA]: Secondary | ICD-10-CM

## 2015-09-01 LAB — POCT I-STAT CREATININE: CREATININE: 2.6 mg/dL — AB (ref 0.61–1.24)

## 2016-05-07 DIAGNOSIS — N184 Chronic kidney disease, stage 4 (severe): Secondary | ICD-10-CM | POA: Insufficient documentation

## 2016-05-07 DIAGNOSIS — N189 Chronic kidney disease, unspecified: Secondary | ICD-10-CM

## 2016-05-07 DIAGNOSIS — D631 Anemia in chronic kidney disease: Secondary | ICD-10-CM

## 2016-05-07 DIAGNOSIS — D696 Thrombocytopenia, unspecified: Secondary | ICD-10-CM | POA: Insufficient documentation

## 2016-05-07 NOTE — Progress Notes (Signed)
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  INTERVAL HISTORY: Patient is a 75 year old male with a long-standing history of chronic renal insufficiency. He previously required Procrit injections, but reports not having any for several years. Routine blood work revealed a declining hemoglobin. Patient also states he is more weak and fatigued than usual. He otherwise feels well. He has no neurologic complaints. He denies any recent fevers or illnesses. He has a good appetite and denies weight loss. He denies any chest pain or shortness of breath. He denies any nausea, vomiting, constipation, or diarrhea. He denies any melena or hematochezia.  He has no urinary complaints. Patient otherwise feels well and offers no further specific complaints.  REVIEW OF SYSTEMS:   Review of Systems  Constitutional: Positive for malaise/fatigue. Negative for fever and weight loss.  Respiratory: Negative.  Negative for cough and shortness of breath.   Cardiovascular: Negative.  Negative for chest pain and leg swelling.  Gastrointestinal: Negative.  Negative for abdominal pain, blood in stool and melena.  Genitourinary: Negative.   Musculoskeletal: Negative.   Neurological: Positive for weakness.  Psychiatric/Behavioral: Negative.  The patient is not nervous/anxious.     As per HPI. Otherwise, a complete review of systems is negative.  PAST MEDICAL HISTORY: Past Medical History:  Diagnosis Date  . Anemia   . Arthritis   . Chronic kidney disease    Stage 4 CKD  . Diabetes mellitus without complication ([place])   . Gout   . Hyperlipidemia, mixed   . Hypertension   . Sleep apnea     PAST SURGICAL HISTORY: Past Surgical History:  Procedure  Laterality Date  . APPENDECTOMY    . Bunion Removal Right   . COLONOSCOPY WITH PROPOFOL N/A 06/14/2015   Procedure: COLONOSCOPY WITH PROPOFOL;  Surgeon: Lollie Sails, MD;  Location: [facility] ENDOSCOPY;  Service: Endoscopy;  Laterality: N/A;  . HERNIA REPAIR    . RETINAL DETACHMENT SURGERY Left   . TRANSURETHRAL RESECTION OF PROSTATE      FAMILY HISTORY: Family History  Problem Relation Age of Onset  . Leukemia Father   . Leukemia Brother     ADVANCED DIRECTIVES (Y/N):  N  HEALTH MAINTENANCE: Social History  Substance Use Topics  . Smoking status: Never Smoker  . Smokeless tobacco: Never Used  . Alcohol use Yes     Colonoscopy:  PAP:  Bone density:  Lipid panel:  Allergies  Allergen Reactions  . Cardura [Doxazosin] Other (See Comments)  . Other Swelling    Fish- facial swelling    Current Outpatient Prescriptions  Medication Sig Dispense Refill  . amLODipine (NORVASC) 10 MG tablet Take 10 mg by mouth daily.    Marland Kitchen aspirin EC 81 MG tablet Take 81 mg by mouth daily.    . Cholecalciferol 2000 UNITS CAPS Take 2,000 Units by mouth daily.    . enalapril (VASOTEC) 20 MG tablet Take 20 mg by mouth daily.    . finasteride (PROSCAR) 5 MG tablet Take 5 mg by mouth daily.    Marland Kitchen glimepiride (AMARYL) 4 MG tablet Take 2 mg by mouth daily with breakfast.    . hydrALAZINE (APRESOLINE) 100 MG tablet Take 100 mg by mouth 3 (three) times daily.    Marland Kitchen loratadine (CLARITIN) 10 MG tablet Take 10  mg by mouth daily.    Marland Kitchen lovastatin (MEVACOR) 20 MG tablet Take 20 mg by mouth at bedtime.    . metoprolol (LOPRESSOR) 50 MG tablet Take 50 mg by mouth 2 (two) times daily.     No current facility-administered medications for this visit.     OBJECTIVE: Vitals:   05/08/16 1052 05/08/16 1101  BP: (!) 185/83 (!) 181/77  Pulse: (!) 58 (!) 54  Resp: 18   Temp: (!) 95.4 F (35.2 C)      Body mass index is 37.31 kg/m.    ECOG FS:1 - Symptomatic but completely ambulatory  General:  Well-developed, well-nourished, no acute distress. Eyes: Pink conjunctiva, anicteric sclera. HEENT: Normocephalic, moist mucous membranes, clear oropharnyx. Lungs: Clear to auscultation bilaterally. Heart: Regular rate and rhythm. No rubs, murmurs, or gallops. Abdomen: Soft, nontender, nondistended. No organomegaly noted, normoactive bowel sounds. Musculoskeletal: No edema, cyanosis, or clubbing. Neuro: Alert, answering all questions appropriately. Cranial nerves grossly intact. Skin: No rashes or petechiae noted. Psych: Normal affect. Lymphatics: No cervical, calvicular, axillary or inguinal LAD.   LAB RESULTS:  Lab Results  Component Value Date   NA 141 10/14/2012   K 4.0 10/14/2012   CL 111 (H) 10/14/2012   CO2 20 (L) 10/14/2012   GLUCOSE 171 (H) 10/14/2012   BUN 44 (H) 10/14/2012   CREATININE 2.60 (H) 09/01/2015   CALCIUM 9.2 10/14/2012   PROT 7.3 10/14/2012   ALBUMIN 2.4 (L) 10/14/2012   AST 15 10/14/2012   ALT 15 10/14/2012   ALKPHOS 84 10/14/2012   BILITOT 0.3 10/14/2012   GFRNONAA 25 (L) 10/14/2012   GFRAA 29 (L) 10/14/2012    Lab Results  Component Value Date   WBC 6.5 05/08/2016   NEUTROABS 4.2 12/26/2013   HGB 10.2 (L) 05/08/2016   HCT 30.3 (L) 05/08/2016   MCV 90.3 05/08/2016   PLT 130 (L) 05/08/2016     STUDIES: No results found.  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  PLAN:    1. 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 The remainder of his laboratory work including iron stores is pending at time of dictation.  Currently, his hemoglobin is greater than 10.0 therefore he does not require additional Procrit today. Return to clinic monthly for repeat laboratory work and consideration of 40,000 units Procrit if his hemoglobin falls below 10.0. Patient will then return to clinic in 4 months for further evaluation. nemia:  Likely secondary to patient's chronic renal insufficiency. 2. Thrombocytopenia: Mild and relatively unchanged. The remainder of his laboratory work is pending from today. No intervention is needed at this time. Return to clinic as above for further evaluation and discussion of his laboratory results.  Patient expressed understanding and was in agreement with this plan. He also understands that He can call clinic at any time with any questions, concerns, or complaints.   Lloyd Huger, MD   05/08/2016 12:33 PM

## 2016-05-08 ENCOUNTER — Inpatient Hospital Stay: Payer: Medicare Other | Attending: Oncology | Admitting: Oncology

## 2016-05-08 ENCOUNTER — Encounter (INDEPENDENT_AMBULATORY_CARE_PROVIDER_SITE_OTHER): Payer: Self-pay

## 2016-05-08 ENCOUNTER — Inpatient Hospital Stay: Payer: Medicare Other

## 2016-05-08 ENCOUNTER — Encounter: Payer: Self-pay | Admitting: Oncology

## 2016-05-08 VITALS — BP 181/77 | HR 54 | Temp 95.4°F | Resp 18 | Ht 69.0 in | Wt 252.6 lb

## 2016-05-08 DIAGNOSIS — N184 Chronic kidney disease, stage 4 (severe): Secondary | ICD-10-CM | POA: Insufficient documentation

## 2016-05-08 DIAGNOSIS — G4730 Sleep apnea, unspecified: Secondary | ICD-10-CM | POA: Insufficient documentation

## 2016-05-08 DIAGNOSIS — M109 Gout, unspecified: Secondary | ICD-10-CM | POA: Diagnosis not present

## 2016-05-08 DIAGNOSIS — E785 Hyperlipidemia, unspecified: Secondary | ICD-10-CM | POA: Insufficient documentation

## 2016-05-08 DIAGNOSIS — I10 Essential (primary) hypertension: Secondary | ICD-10-CM | POA: Diagnosis not present

## 2016-05-08 DIAGNOSIS — D696 Thrombocytopenia, unspecified: Secondary | ICD-10-CM

## 2016-05-08 DIAGNOSIS — Z7984 Long term (current) use of oral hypoglycemic drugs: Secondary | ICD-10-CM | POA: Diagnosis not present

## 2016-05-08 DIAGNOSIS — D631 Anemia in chronic kidney disease: Secondary | ICD-10-CM | POA: Diagnosis not present

## 2016-05-08 DIAGNOSIS — N183 Chronic kidney disease, stage 3 unspecified: Secondary | ICD-10-CM

## 2016-05-08 DIAGNOSIS — Z7982 Long term (current) use of aspirin: Secondary | ICD-10-CM | POA: Insufficient documentation

## 2016-05-08 DIAGNOSIS — M129 Arthropathy, unspecified: Secondary | ICD-10-CM | POA: Diagnosis not present

## 2016-05-08 DIAGNOSIS — I129 Hypertensive chronic kidney disease with stage 1 through stage 4 chronic kidney disease, or unspecified chronic kidney disease: Secondary | ICD-10-CM | POA: Insufficient documentation

## 2016-05-08 DIAGNOSIS — Z79899 Other long term (current) drug therapy: Secondary | ICD-10-CM | POA: Diagnosis not present

## 2016-05-08 DIAGNOSIS — Z806 Family history of leukemia: Secondary | ICD-10-CM | POA: Insufficient documentation

## 2016-05-08 DIAGNOSIS — R5383 Other fatigue: Secondary | ICD-10-CM | POA: Diagnosis not present

## 2016-05-08 DIAGNOSIS — E119 Type 2 diabetes mellitus without complications: Secondary | ICD-10-CM | POA: Diagnosis not present

## 2016-05-08 DIAGNOSIS — R531 Weakness: Secondary | ICD-10-CM | POA: Diagnosis not present

## 2016-05-08 LAB — IRON AND TIBC
IRON: 60 ug/dL (ref 45–182)
Saturation Ratios: 22 % (ref 17.9–39.5)
TIBC: 268 ug/dL (ref 250–450)
UIBC: 208 ug/dL

## 2016-05-08 LAB — LACTATE DEHYDROGENASE: LDH: 143 U/L (ref 98–192)

## 2016-05-08 LAB — CBC
HEMATOCRIT: 30.3 % — AB (ref 40.0–52.0)
Hemoglobin: 10.2 g/dL — ABNORMAL LOW (ref 13.0–18.0)
MCH: 30.3 pg (ref 26.0–34.0)
MCHC: 33.5 g/dL (ref 32.0–36.0)
MCV: 90.3 fL (ref 80.0–100.0)
PLATELETS: 130 10*3/uL — AB (ref 150–440)
RBC: 3.35 MIL/uL — AB (ref 4.40–5.90)
RDW: 14.4 % (ref 11.5–14.5)
WBC: 6.5 10*3/uL (ref 3.8–10.6)

## 2016-05-08 LAB — FERRITIN: FERRITIN: 227 ng/mL (ref 24–336)

## 2016-05-08 LAB — FOLATE: Folate: 15.9 ng/mL (ref >5.9)

## 2016-05-08 LAB — VITAMIN B12: VITAMIN B 12: 194 pg/mL (ref 180–914)

## 2016-05-08 NOTE — Progress Notes (Signed)
Patient here today as new evaluation regarding anemia/thrombocytopenia.  Referred by Dr. Holley Raring.  Patient states he is "tired and run down feeling".  Also has a lot of "nagging headaches".  Also states he has frequent "dizzy spells".   Has SOB with exertion.  Family history of leukemia - father, brother and sister.

## 2016-05-09 LAB — PROTEIN ELECTROPHORESIS, SERUM
A/G RATIO SPE: 1.1 (ref 0.7–1.7)
Albumin ELP: 3.8 g/dL (ref 2.9–4.4)
Alpha-1-Globulin: 0.2 g/dL (ref 0.0–0.4)
Alpha-2-Globulin: 0.6 g/dL (ref 0.4–1.0)
BETA GLOBULIN: 1.1 g/dL (ref 0.7–1.3)
GAMMA GLOBULIN: 1.6 g/dL (ref 0.4–1.8)
Globulin, Total: 3.5 g/dL (ref 2.2–3.9)
Total Protein ELP: 7.3 g/dL (ref 6.0–8.5)

## 2016-05-09 LAB — PLATELET ANTIBODY PROFILE, SERUM
HLA AB SER QL EIA: NEGATIVE
IA/IIA Antibody: NEGATIVE
IB/IX Antibody: NEGATIVE
IIB/IIIA ANTIBODY: NEGATIVE

## 2016-05-09 LAB — HAPTOGLOBIN: Haptoglobin: 110 mg/dL (ref 34–200)

## 2016-05-11 ENCOUNTER — Inpatient Hospital Stay: Payer: Medicare Other

## 2016-06-02 ENCOUNTER — Other Ambulatory Visit: Payer: Self-pay | Admitting: *Deleted

## 2016-06-02 DIAGNOSIS — D696 Thrombocytopenia, unspecified: Secondary | ICD-10-CM

## 2016-06-05 ENCOUNTER — Inpatient Hospital Stay: Payer: Medicare Other | Attending: Oncology

## 2016-06-05 ENCOUNTER — Inpatient Hospital Stay: Payer: Medicare Other

## 2016-06-05 ENCOUNTER — Other Ambulatory Visit: Payer: Self-pay

## 2016-06-05 DIAGNOSIS — D696 Thrombocytopenia, unspecified: Secondary | ICD-10-CM | POA: Diagnosis not present

## 2016-06-05 LAB — CBC WITH DIFFERENTIAL/PLATELET
BASOS ABS: 0 10*3/uL (ref 0–0.1)
BASOS PCT: 1 %
EOS ABS: 0.4 10*3/uL (ref 0–0.7)
EOS PCT: 6 %
HEMATOCRIT: 30.3 % — AB (ref 40.0–52.0)
Hemoglobin: 10.4 g/dL — ABNORMAL LOW (ref 13.0–18.0)
Lymphocytes Relative: 17 %
Lymphs Abs: 1.1 10*3/uL (ref 1.0–3.6)
MCH: 31 pg (ref 26.0–34.0)
MCHC: 34.4 g/dL (ref 32.0–36.0)
MCV: 90.1 fL (ref 80.0–100.0)
MONO ABS: 0.5 10*3/uL (ref 0.2–1.0)
MONOS PCT: 8 %
NEUTROS ABS: 4.6 10*3/uL (ref 1.4–6.5)
Neutrophils Relative %: 68 %
PLATELETS: 115 10*3/uL — AB (ref 150–440)
RBC: 3.36 MIL/uL — ABNORMAL LOW (ref 4.40–5.90)
RDW: 14.2 % (ref 11.5–14.5)
WBC: 6.6 10*3/uL (ref 3.8–10.6)

## 2016-06-28 DIAGNOSIS — D693 Immune thrombocytopenic purpura: Secondary | ICD-10-CM | POA: Insufficient documentation

## 2016-07-03 ENCOUNTER — Inpatient Hospital Stay: Payer: Medicare Other

## 2016-07-03 ENCOUNTER — Inpatient Hospital Stay: Payer: Medicare Other | Attending: Oncology | Admitting: *Deleted

## 2016-07-03 ENCOUNTER — Other Ambulatory Visit: Payer: Self-pay | Admitting: *Deleted

## 2016-07-03 VITALS — BP 146/65 | HR 65

## 2016-07-03 DIAGNOSIS — I129 Hypertensive chronic kidney disease with stage 1 through stage 4 chronic kidney disease, or unspecified chronic kidney disease: Secondary | ICD-10-CM | POA: Insufficient documentation

## 2016-07-03 DIAGNOSIS — D631 Anemia in chronic kidney disease: Secondary | ICD-10-CM

## 2016-07-03 DIAGNOSIS — N183 Chronic kidney disease, stage 3 unspecified: Secondary | ICD-10-CM

## 2016-07-03 DIAGNOSIS — Z79899 Other long term (current) drug therapy: Secondary | ICD-10-CM | POA: Insufficient documentation

## 2016-07-03 DIAGNOSIS — N189 Chronic kidney disease, unspecified: Secondary | ICD-10-CM

## 2016-07-03 LAB — HEMOGLOBIN: Hemoglobin: 10 g/dL — ABNORMAL LOW (ref 13.0–18.0)

## 2016-07-03 MED ORDER — EPOETIN ALFA 40000 UNIT/ML IJ SOLN
40000.0000 [IU] | Freq: Once | INTRAMUSCULAR | Status: AC
  Administered 2016-07-03: 40000 [IU] via SUBCUTANEOUS
  Filled 2016-07-03: qty 1

## 2016-07-03 NOTE — Progress Notes (Signed)
Parameters for Procrit are hold if hemoglobin is greater than 10.  Hemoglobin today is 10.0 and patient reports symptoms increase in fatigue.  Discussed with Dr. Grayland Ormond and [place] for patient to receive Procrit injection today.

## 2016-07-31 ENCOUNTER — Inpatient Hospital Stay: Payer: Medicare Other

## 2016-07-31 ENCOUNTER — Other Ambulatory Visit: Payer: Self-pay

## 2016-07-31 ENCOUNTER — Inpatient Hospital Stay: Payer: Medicare Other | Attending: Oncology

## 2016-07-31 DIAGNOSIS — D696 Thrombocytopenia, unspecified: Secondary | ICD-10-CM | POA: Diagnosis not present

## 2016-07-31 DIAGNOSIS — D631 Anemia in chronic kidney disease: Secondary | ICD-10-CM

## 2016-07-31 DIAGNOSIS — N189 Chronic kidney disease, unspecified: Principal | ICD-10-CM

## 2016-07-31 LAB — CBC WITH DIFFERENTIAL/PLATELET
BASOS ABS: 0 10*3/uL (ref 0–0.1)
Basophils Relative: 1 %
Eosinophils Absolute: 0.1 10*3/uL (ref 0–0.7)
Eosinophils Relative: 3 %
HCT: 32.8 % — ABNORMAL LOW (ref 40.0–52.0)
Hemoglobin: 10.8 g/dL — ABNORMAL LOW (ref 13.0–18.0)
LYMPHS PCT: 22 %
Lymphs Abs: 1.2 10*3/uL (ref 1.0–3.6)
MCH: 29.7 pg (ref 26.0–34.0)
MCHC: 33 g/dL (ref 32.0–36.0)
MCV: 89.9 fL (ref 80.0–100.0)
Monocytes Absolute: 0.4 10*3/uL (ref 0.2–1.0)
Monocytes Relative: 7 %
NEUTROS ABS: 3.7 10*3/uL (ref 1.4–6.5)
Neutrophils Relative %: 67 %
PLATELETS: 129 10*3/uL — AB (ref 150–440)
RBC: 3.64 MIL/uL — AB (ref 4.40–5.90)
RDW: 15 % — AB (ref 11.5–14.5)
WBC: 5.5 10*3/uL (ref 3.8–10.6)

## 2016-08-25 ENCOUNTER — Other Ambulatory Visit: Payer: Self-pay | Admitting: *Deleted

## 2016-08-25 DIAGNOSIS — D649 Anemia, unspecified: Secondary | ICD-10-CM

## 2016-08-28 ENCOUNTER — Inpatient Hospital Stay: Payer: Medicare Other

## 2016-08-28 ENCOUNTER — Inpatient Hospital Stay: Payer: Medicare Other | Attending: Oncology | Admitting: Oncology

## 2016-08-28 VITALS — BP 158/88 | HR 68 | Temp 97.7°F | Resp 18 | Wt 250.2 lb

## 2016-08-28 DIAGNOSIS — Z7982 Long term (current) use of aspirin: Secondary | ICD-10-CM | POA: Diagnosis not present

## 2016-08-28 DIAGNOSIS — E785 Hyperlipidemia, unspecified: Secondary | ICD-10-CM | POA: Insufficient documentation

## 2016-08-28 DIAGNOSIS — G4730 Sleep apnea, unspecified: Secondary | ICD-10-CM | POA: Insufficient documentation

## 2016-08-28 DIAGNOSIS — D696 Thrombocytopenia, unspecified: Secondary | ICD-10-CM | POA: Diagnosis not present

## 2016-08-28 DIAGNOSIS — E1122 Type 2 diabetes mellitus with diabetic chronic kidney disease: Secondary | ICD-10-CM | POA: Insufficient documentation

## 2016-08-28 DIAGNOSIS — N184 Chronic kidney disease, stage 4 (severe): Secondary | ICD-10-CM | POA: Insufficient documentation

## 2016-08-28 DIAGNOSIS — M109 Gout, unspecified: Secondary | ICD-10-CM | POA: Insufficient documentation

## 2016-08-28 DIAGNOSIS — I129 Hypertensive chronic kidney disease with stage 1 through stage 4 chronic kidney disease, or unspecified chronic kidney disease: Secondary | ICD-10-CM | POA: Diagnosis not present

## 2016-08-28 DIAGNOSIS — Z9049 Acquired absence of other specified parts of digestive tract: Secondary | ICD-10-CM | POA: Insufficient documentation

## 2016-08-28 DIAGNOSIS — Z79899 Other long term (current) drug therapy: Secondary | ICD-10-CM | POA: Diagnosis not present

## 2016-08-28 DIAGNOSIS — M129 Arthropathy, unspecified: Secondary | ICD-10-CM | POA: Diagnosis not present

## 2016-08-28 DIAGNOSIS — Z806 Family history of leukemia: Secondary | ICD-10-CM | POA: Insufficient documentation

## 2016-08-28 DIAGNOSIS — D631 Anemia in chronic kidney disease: Secondary | ICD-10-CM | POA: Diagnosis not present

## 2016-08-28 DIAGNOSIS — N183 Chronic kidney disease, stage 3 (moderate): Secondary | ICD-10-CM

## 2016-08-28 DIAGNOSIS — D649 Anemia, unspecified: Secondary | ICD-10-CM

## 2016-08-28 LAB — CBC WITH DIFFERENTIAL/PLATELET
Basophils Absolute: 0.1 10*3/uL (ref 0–0.1)
Basophils Relative: 1 %
EOS PCT: 3 %
Eosinophils Absolute: 0.2 10*3/uL (ref 0–0.7)
HCT: 32.4 % — ABNORMAL LOW (ref 40.0–52.0)
Hemoglobin: 10.7 g/dL — ABNORMAL LOW (ref 13.0–18.0)
LYMPHS PCT: 20 %
Lymphs Abs: 1.3 10*3/uL (ref 1.0–3.6)
MCH: 29.5 pg (ref 26.0–34.0)
MCHC: 33 g/dL (ref 32.0–36.0)
MCV: 89.5 fL (ref 80.0–100.0)
Monocytes Absolute: 0.6 10*3/uL (ref 0.2–1.0)
Monocytes Relative: 9 %
NEUTROS ABS: 4.4 10*3/uL (ref 1.4–6.5)
Neutrophils Relative %: 67 %
PLATELETS: 119 10*3/uL — AB (ref 150–440)
RBC: 3.62 MIL/uL — AB (ref 4.40–5.90)
RDW: 14.7 % — ABNORMAL HIGH (ref 11.5–14.5)
WBC: 6.5 10*3/uL (ref 3.8–10.6)

## 2016-08-28 NOTE — Progress Notes (Signed)
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  INTERVAL HISTORY: Patient returns to clinic today for repeat laboratory work and further evaluation. He currently feels well and is asymptomatic. He does not complain of weakness or fatigue today. He has no neurologic complaints. He denies any recent fevers or illnesses. He has a good appetite and denies weight loss. He denies any chest pain or shortness of breath. He denies any nausea, vomiting, constipation, or diarrhea. He denies any melena or hematochezia.  He has no urinary complaints. Patient offers no specific complaints today.  REVIEW OF SYSTEMS:   Review of Systems  Constitutional: Negative for fever, malaise/fatigue and weight loss.  Respiratory: Negative.  Negative for cough and shortness of breath.   Cardiovascular: Negative.  Negative for chest pain and leg swelling.  Gastrointestinal: Negative.  Negative for abdominal pain, blood in stool and melena.  Genitourinary: Negative.   Musculoskeletal: Negative.   Neurological: Negative.  Negative for weakness.  Psychiatric/Behavioral: Negative.  The patient is not nervous/anxious.     As per HPI. Otherwise, a complete review of systems is negative.  PAST MEDICAL HISTORY: Past Medical History:  Diagnosis Date  . Anemia   . Arthritis   . Chronic kidney disease    Stage 4 CKD  . Diabetes mellitus without complication ([place])   . Gout   . Hyperlipidemia, mixed   . Hypertension   . Sleep apnea     PAST SURGICAL HISTORY: Past Surgical History:  Procedure Laterality Date  . APPENDECTOMY    . Bunion Removal Right   . COLONOSCOPY WITH PROPOFOL N/A 06/14/2015   Procedure: COLONOSCOPY WITH PROPOFOL;  Surgeon: Lollie Sails, MD;  Location: [facility] ENDOSCOPY;  Service: Endoscopy;  Laterality: N/A;  . HERNIA REPAIR    . RETINAL DETACHMENT SURGERY Left   . TRANSURETHRAL RESECTION OF PROSTATE      FAMILY HISTORY: Family History  Problem Relation Age of Onset  . Leukemia Father   . Leukemia Brother     ADVANCED DIRECTIVES (Y/N):  N  HEALTH MAINTENANCE: Social History  Substance Use Topics  . Smoking status: Never Smoker  . Smokeless tobacco: Never Used  . Alcohol use Yes     Colonoscopy:  PAP:  Bone density:  Lipid panel:  Allergies  Allergen Reactions  . Cardura [Doxazosin] Other (See Comments)  . Other Swelling    Fish- facial swelling    Current Outpatient Prescriptions  Medication Sig Dispense Refill  . amLODipine (NORVASC) 10 MG tablet Take 10 mg by mouth daily.    Marland Kitchen aspirin EC 81 MG tablet Take 81 mg by mouth daily.    . Cholecalciferol 2000 UNITS CAPS Take 2,000 Units by mouth daily.    . enalapril (VASOTEC) 20 MG tablet Take 20 mg by mouth daily.    . finasteride (PROSCAR) 5 MG tablet Take 5 mg by mouth daily.    Marland Kitchen glimepiride (AMARYL) 4 MG tablet Take 2 mg by mouth daily with breakfast.    . hydrALAZINE (APRESOLINE) 100 MG tablet Take 100 mg by mouth 3 (three) times daily.    Marland Kitchen loratadine (CLARITIN) 10 MG tablet Take 10 mg by mouth daily.    Marland Kitchen lovastatin (MEVACOR) 20 MG tablet Take 20 mg by mouth at bedtime.    Marland Kitchen  metoprolol (LOPRESSOR) 50 MG tablet Take 50 mg by mouth 2 (two) times daily.     No current facility-administered medications for this visit.     OBJECTIVE: Vitals:   08/28/16 1103  BP: (!) 158/88  Pulse: 68  Resp: 18  Temp: 97.7 F (36.5 C)     Body mass index is 36.95 kg/m.    ECOG FS:1 - Symptomatic but completely ambulatory  General: Well-developed, well-nourished, no acute distress. Eyes: Pink conjunctiva, anicteric sclera. Lungs: Clear to auscultation bilaterally. Heart: Regular rate and rhythm. No rubs, murmurs, or gallops. Abdomen: Soft,  nontender, nondistended. No organomegaly noted, normoactive bowel sounds. Musculoskeletal: No edema, cyanosis, or clubbing. Neuro: Alert, answering all questions appropriately. Cranial nerves grossly intact. Skin: No rashes or petechiae noted. Psych: Normal affect.   LAB RESULTS:  Lab Results  Component Value Date   NA 141 10/14/2012   K 4.0 10/14/2012   CL 111 (H) 10/14/2012   CO2 20 (L) 10/14/2012   GLUCOSE 171 (H) 10/14/2012   BUN 44 (H) 10/14/2012   CREATININE 2.60 (H) 09/01/2015   CALCIUM 9.2 10/14/2012   PROT 7.3 10/14/2012   ALBUMIN 2.4 (L) 10/14/2012   AST 15 10/14/2012   ALT 15 10/14/2012   ALKPHOS 84 10/14/2012   BILITOT 0.3 10/14/2012   GFRNONAA 25 (L) 10/14/2012   GFRAA 29 (L) 10/14/2012    Lab Results  Component Value Date   WBC 6.5 08/28/2016   NEUTROABS 4.4 08/28/2016   HGB 10.7 (L) 08/28/2016   HCT 32.4 (L) 08/28/2016   MCV 89.5 08/28/2016   PLT 119 (L) 08/28/2016   Lab Results  Component Value Date   IRON 60 05/08/2016   TIBC 268 05/08/2016   IRONPCTSAT 22 05/08/2016   Lab Results  Component Value Date   FERRITIN 227 05/08/2016     STUDIES: No results found.  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  PLAN:    1. 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 Patient's hemoglobin is 10.7 today, therefore he does not require additional Procrit. Previously, the remainder of his laboratory work including iron stores was either negative or within normal limits. Patient last received Procrit in November 2017. Return to clinic in 3 months for laboratory work and consideration of Procrit and then in 6 months for further evaluation.  2. Thrombocytopenia: Mild and relatively unchanged. Previously, the remainder of his laboratory work was either negative or within normal limits. No intervention is needed at this time. Patient  does not require bone marrow biopsy. 3. Chronic renal failure: Patient's creatinine appears to be at his baseline. Continue monitor per nephrology.   Patient expressed understanding and was in agreement with this plan. He also understands that He can call clinic at any time with any questions, concerns, or complaints.   Lloyd Huger, MD   08/28/2016 11:24 AM

## 2016-08-28 NOTE — Progress Notes (Signed)
Offers no complaints. Feeling well. 

## 2016-11-27 ENCOUNTER — Inpatient Hospital Stay: Payer: Medicare Other

## 2016-12-05 ENCOUNTER — Other Ambulatory Visit (HOSPITAL_COMMUNITY): Payer: Self-pay | Admitting: Urology

## 2016-12-05 DIAGNOSIS — R972 Elevated prostate specific antigen [PSA]: Secondary | ICD-10-CM

## 2016-12-13 ENCOUNTER — Other Ambulatory Visit (HOSPITAL_COMMUNITY): Payer: Self-pay | Admitting: Urology

## 2016-12-13 ENCOUNTER — Ambulatory Visit (HOSPITAL_COMMUNITY)
Admission: RE | Admit: 2016-12-13 | Discharge: 2016-12-13 | Disposition: A | Discharge status: Home / Self Care | Payer: Medicare Other | Source: Ambulatory Visit | Attending: Urology | Admitting: Urology

## 2016-12-13 DIAGNOSIS — R972 Elevated prostate specific antigen [PSA]: Secondary | ICD-10-CM

## 2016-12-13 DIAGNOSIS — N4289 Other specified disorders of prostate: Secondary | ICD-10-CM | POA: Insufficient documentation

## 2016-12-13 LAB — POCT I-STAT, CHEM 8
BUN: 66 mg/dL — AB (ref 6–20)
CALCIUM ION: 1.33 mmol/L (ref 1.15–1.40)
CHLORIDE: 114 mmol/L — AB (ref 101–111)
CREATININE: 3 mg/dL — AB (ref 0.61–1.24)
GLUCOSE: 127 mg/dL — AB (ref 65–99)
HCT: 30 % — ABNORMAL LOW (ref 39.0–52.0)
Hemoglobin: 10.2 g/dL — ABNORMAL LOW (ref 13.0–17.0)
Potassium: 5 mmol/L (ref 3.5–5.1)
Sodium: 141 mmol/L (ref 135–145)
TCO2: 22 mmol/L (ref 0–100)

## 2016-12-13 MED ORDER — LIDOCAINE HCL 2 % EX GEL: 1.0000 "application " | Freq: Once | CUTANEOUS | Status: DC

## 2016-12-13 MED ORDER — LIDOCAINE HCL 2 % EX GEL
CUTANEOUS | Status: AC
  Filled 2016-12-13: qty 30

## 2016-12-13 MED ORDER — GADOBENATE DIMEGLUMINE 529 MG/ML IV SOLN: 20.0000 mL | Freq: Once | INTRAVENOUS | Status: DC | PRN

## 2017-02-23 NOTE — Progress Notes (Signed)
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  INTERVAL HISTORY: Patient returns to clinic today for repeat laboratory work, further evaluation, and consideration of additional Procrit. He continues to feel well and is asymptomatic. He does not complain of weakness or fatigue today. He has no neurologic complaints. He denies any recent fevers or illnesses. He has a good appetite and denies weight loss. He denies any chest pain or shortness of breath. He denies any nausea, vomiting, constipation, or diarrhea. He denies any melena or hematochezia.  He has no urinary complaints. Patient offers no specific complaints today.  REVIEW OF SYSTEMS:   Review of Systems  Constitutional: Negative for fever, malaise/fatigue and weight loss.  Respiratory: Negative.  Negative for cough and shortness of breath.   Cardiovascular: Negative.  Negative for chest pain and leg swelling.  Gastrointestinal: Negative.  Negative for abdominal pain, blood in stool and melena.  Genitourinary: Negative.   Musculoskeletal: Negative.   Skin: Negative.  Negative for rash.  Neurological: Negative.  Negative for sensory change and weakness.  Psychiatric/Behavioral: Negative.  The patient is not nervous/anxious.     As per HPI. Otherwise, a complete review of systems is negative.  PAST MEDICAL HISTORY: Past Medical History:  Diagnosis Date  . Anemia   . Arthritis   . Chronic kidney disease    Stage 4 CKD  . Diabetes mellitus without complication ([place])   . Gout   . Hyperlipidemia, mixed   . Hypertension   . Sleep apnea     PAST SURGICAL HISTORY: Past Surgical History:  Procedure Laterality Date  . APPENDECTOMY    . Bunion Removal Right   .  COLONOSCOPY WITH PROPOFOL N/A 06/14/2015   Procedure: COLONOSCOPY WITH PROPOFOL;  Surgeon: Lollie Sails, MD;  Location: [facility] ENDOSCOPY;  Service: Endoscopy;  Laterality: N/A;  . HERNIA REPAIR    . RETINAL DETACHMENT SURGERY Left   . TRANSURETHRAL RESECTION OF PROSTATE      FAMILY HISTORY: Family History  Problem Relation Age of Onset  . Leukemia Father   . Leukemia Brother     ADVANCED DIRECTIVES (Y/N):  N  HEALTH MAINTENANCE: Social History  Substance Use Topics  . Smoking status: Never Smoker  . Smokeless tobacco: Never Used  . Alcohol use Yes     Colonoscopy:  PAP:  Bone density:  Lipid panel:  Allergies  Allergen Reactions  . Cardura [Doxazosin] Other (See Comments)  . Other Swelling    Fish- facial swelling    Current Outpatient Prescriptions  Medication Sig Dispense Refill  . amLODipine (NORVASC) 10 MG tablet Take 10 mg by mouth daily.    Marland Kitchen aspirin EC 81 MG tablet Take 81 mg by mouth daily.    . Cholecalciferol 2000 UNITS CAPS Take 2,000 Units by mouth daily.    . enalapril (VASOTEC) 20 MG tablet Take 20 mg by mouth daily.    . finasteride (PROSCAR) 5 MG tablet Take 5 mg by mouth daily.    Marland Kitchen glimepiride (AMARYL) 4 MG tablet Take 2 mg by mouth daily with breakfast.    . hydrALAZINE (APRESOLINE) 100 MG tablet Take 100 mg by mouth 3 (three) times daily.    Marland Kitchen loratadine (CLARITIN) 10 MG tablet Take 10 mg by mouth daily.    Marland Kitchen lovastatin (  MEVACOR) 20 MG tablet Take 20 mg by mouth at bedtime.    . metoprolol (LOPRESSOR) 50 MG tablet Take 50 mg by mouth 2 (two) times daily.     No current facility-administered medications for this visit.     OBJECTIVE: Vitals:   02/26/17 1021  BP: (!) 166/68  Pulse: 68  Resp: 20  Temp: (!) 96 F (35.6 C)     Body mass index is 36.9 kg/m.    ECOG FS:0 - Asymptomatic  General: Well-developed, well-nourished, no acute distress. Eyes: Pink conjunctiva, anicteric sclera. Lungs: Clear to auscultation bilaterally. Heart:  Regular rate and rhythm. No rubs, murmurs, or gallops. Abdomen: Soft, nontender, nondistended. No organomegaly noted, normoactive bowel sounds. Musculoskeletal: No edema, cyanosis, or clubbing. Neuro: Alert, answering all questions appropriately. Cranial nerves grossly intact. Skin: No rashes or petechiae noted. Psych: Normal affect.   LAB RESULTS:  Lab Results  Component Value Date   NA 141 12/13/2016   K 5.0 12/13/2016   CL 114 (H) 12/13/2016   CO2 20 (L) 10/14/2012   GLUCOSE 127 (H) 12/13/2016   BUN 66 (H) 12/13/2016   CREATININE 3.00 (H) 12/13/2016   CALCIUM 9.2 10/14/2012   PROT 7.3 10/14/2012   ALBUMIN 2.4 (L) 10/14/2012   AST 15 10/14/2012   ALT 15 10/14/2012   ALKPHOS 84 10/14/2012   BILITOT 0.3 10/14/2012   GFRNONAA 25 (L) 10/14/2012   GFRAA 29 (L) 10/14/2012    Lab Results  Component Value Date   WBC 5.9 02/26/2017   NEUTROABS 4.0 02/26/2017   HGB 9.7 (L) 02/26/2017   HCT 28.6 (L) 02/26/2017   MCV 89.7 02/26/2017   PLT 131 (L) 02/26/2017   Lab Results  Component Value Date   IRON 60 05/08/2016   TIBC 268 05/08/2016   IRONPCTSAT 22 05/08/2016   Lab Results  Component Value Date   FERRITIN 227 05/08/2016     STUDIES: No results found.  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  PLAN:    1. 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 Patient's hemoglobin is 9.7 today, therefore he Will receive 40,000 units subcutaneous Procrit today. Previously, the remainder of his laboratory work including iron stores was either negative or within normal limits. Patient last received Procrit in November 2017. It appears patient only requires injections every 6 months, therefore return to clinic in 6 months for repeat laboratory work and further evaluation.  2. Thrombocytopenia: Mild and relatively unchanged. Previously, the remainder of his  laboratory work was either negative or within normal limits. No intervention is needed at this time. Patient does not require bone marrow biopsy. 3. Chronic renal failure: Patient's creatinine appears to be at his baseline. Continue monitor per nephrology.  4. Hypertension: Patient's blood pressure is elevated today. Continue current medications and monitoring by PCP.  Patient expressed understanding and was in agreement with this plan. He also understands that He can call clinic at any time with any questions, concerns, or complaints.   Lloyd Huger, MD   02/26/2017 10:44 AM

## 2017-02-26 ENCOUNTER — Other Ambulatory Visit: Payer: Self-pay | Admitting: *Deleted

## 2017-02-26 ENCOUNTER — Inpatient Hospital Stay: Payer: Medicare Other | Attending: Oncology

## 2017-02-26 ENCOUNTER — Inpatient Hospital Stay: Payer: Medicare Other

## 2017-02-26 ENCOUNTER — Inpatient Hospital Stay (HOSPITAL_BASED_OUTPATIENT_CLINIC_OR_DEPARTMENT_OTHER): Payer: Medicare Other | Admitting: Oncology

## 2017-02-26 VITALS — BP 166/68 | HR 68 | Temp 96.0°F | Resp 20 | Wt 249.9 lb

## 2017-02-26 DIAGNOSIS — I129 Hypertensive chronic kidney disease with stage 1 through stage 4 chronic kidney disease, or unspecified chronic kidney disease: Secondary | ICD-10-CM | POA: Diagnosis not present

## 2017-02-26 DIAGNOSIS — D696 Thrombocytopenia, unspecified: Secondary | ICD-10-CM

## 2017-02-26 DIAGNOSIS — D631 Anemia in chronic kidney disease: Secondary | ICD-10-CM

## 2017-02-26 DIAGNOSIS — N183 Chronic kidney disease, stage 3 unspecified: Secondary | ICD-10-CM

## 2017-02-26 DIAGNOSIS — N184 Chronic kidney disease, stage 4 (severe): Secondary | ICD-10-CM | POA: Diagnosis not present

## 2017-02-26 DIAGNOSIS — Z79899 Other long term (current) drug therapy: Secondary | ICD-10-CM

## 2017-02-26 DIAGNOSIS — E785 Hyperlipidemia, unspecified: Secondary | ICD-10-CM | POA: Insufficient documentation

## 2017-02-26 DIAGNOSIS — Z7982 Long term (current) use of aspirin: Secondary | ICD-10-CM

## 2017-02-26 DIAGNOSIS — E119 Type 2 diabetes mellitus without complications: Secondary | ICD-10-CM

## 2017-02-26 DIAGNOSIS — D649 Anemia, unspecified: Secondary | ICD-10-CM

## 2017-02-26 DIAGNOSIS — G4730 Sleep apnea, unspecified: Secondary | ICD-10-CM | POA: Diagnosis not present

## 2017-02-26 DIAGNOSIS — Z7984 Long term (current) use of oral hypoglycemic drugs: Secondary | ICD-10-CM | POA: Diagnosis not present

## 2017-02-26 LAB — CBC WITH DIFFERENTIAL/PLATELET
Basophils Absolute: 0 10*3/uL (ref 0–0.1)
Basophils Relative: 1 %
EOS ABS: 0.5 10*3/uL (ref 0–0.7)
Eosinophils Relative: 9 %
HCT: 28.6 % — ABNORMAL LOW (ref 40.0–52.0)
HEMOGLOBIN: 9.7 g/dL — AB (ref 13.0–18.0)
LYMPHS ABS: 1 10*3/uL (ref 1.0–3.6)
LYMPHS PCT: 17 %
MCH: 30.6 pg (ref 26.0–34.0)
MCHC: 34.1 g/dL (ref 32.0–36.0)
MCV: 89.7 fL (ref 80.0–100.0)
MONOS PCT: 6 %
Monocytes Absolute: 0.4 10*3/uL (ref 0.2–1.0)
NEUTROS PCT: 67 %
Neutro Abs: 4 10*3/uL (ref 1.4–6.5)
Platelets: 131 10*3/uL — ABNORMAL LOW (ref 150–440)
RBC: 3.18 MIL/uL — ABNORMAL LOW (ref 4.40–5.90)
RDW: 14 % (ref 11.5–14.5)
WBC: 5.9 10*3/uL (ref 3.8–10.6)

## 2017-02-26 LAB — FERRITIN: Ferritin: 173 ng/mL (ref 24–336)

## 2017-02-26 LAB — IRON AND TIBC
IRON: 52 ug/dL (ref 45–182)
Saturation Ratios: 21 % (ref 17.9–39.5)
TIBC: 244 ug/dL — AB (ref 250–450)
UIBC: 192 ug/dL

## 2017-02-26 MED ORDER — EPOETIN ALFA 40000 UNIT/ML IJ SOLN
40000.0000 [IU] | Freq: Once | INTRAMUSCULAR | Status: AC
  Administered 2017-02-26: 40000 [IU] via SUBCUTANEOUS

## 2017-02-26 NOTE — Progress Notes (Signed)
Patient denies any concerns today.  

## 2017-03-19 ENCOUNTER — Encounter
Admission: RE | Admit: 2017-03-19 | Discharge: 2017-03-19 | Disposition: A | Discharge status: Home / Self Care | Payer: Medicare Other | Source: Ambulatory Visit | Attending: Urology | Admitting: Urology

## 2017-03-19 DIAGNOSIS — R001 Bradycardia, unspecified: Secondary | ICD-10-CM | POA: Insufficient documentation

## 2017-03-19 DIAGNOSIS — Z01810 Encounter for preprocedural cardiovascular examination: Secondary | ICD-10-CM | POA: Insufficient documentation

## 2017-03-19 DIAGNOSIS — I10 Essential (primary) hypertension: Secondary | ICD-10-CM | POA: Insufficient documentation

## 2017-03-19 HISTORY — DX: Gastro-esophageal reflux disease without esophagitis: K21.9

## 2017-03-19 NOTE — Pre-Procedure Instructions (Signed)
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  INTERVAL HISTORY: Patient returns to clinic today for repeat laboratory work, further evaluation, and consideration of additional Procrit. He continues to feel well and is asymptomatic. He does not complain of weakness or fatigue today. He has no neurologic complaints. He denies any recent fevers or illnesses. He has a good appetite and denies weight loss. He denies any chest pain or shortness of breath. He denies any nausea, vomiting, constipation, or diarrhea. He denies any melena or hematochezia.  He has no urinary complaints. Patient offers no specific complaints today.  REVIEW OF SYSTEMS:   Review of Systems  Constitutional: Negative for fever, malaise/fatigue and weight loss.  Respiratory: Negative.  Negative for cough and shortness of breath.   Cardiovascular: Negative.  Negative for chest pain and leg swelling.  Gastrointestinal: Negative.  Negative for abdominal pain, blood in stool and melena.  Genitourinary: Negative.   Musculoskeletal: Negative.   Skin: Negative.  Negative for rash.  Neurological: Negative.  Negative for sensory change and weakness.  Psychiatric/Behavioral: Negative.  The patient is not nervous/anxious.     As per HPI. Otherwise, a complete review of systems is negative.  PAST MEDICAL HISTORY:     Past Medical History:  Diagnosis Date  . Anemia   . Arthritis   . Chronic kidney disease    Stage 4 CKD  . Diabetes mellitus without complication ([place])   . Gout   . Hyperlipidemia, mixed   . Hypertension   .  Sleep apnea     PAST SURGICAL HISTORY:      Past Surgical History:  Procedure Laterality Date  . APPENDECTOMY    . Bunion Removal Right   . COLONOSCOPY WITH PROPOFOL N/A 06/14/2015   Procedure: COLONOSCOPY WITH PROPOFOL;  Surgeon: Lollie Sails, MD;  Location: [facility] ENDOSCOPY;  Service: Endoscopy;  Laterality: N/A;  . HERNIA REPAIR    . RETINAL DETACHMENT SURGERY Left   . TRANSURETHRAL RESECTION OF PROSTATE      FAMILY HISTORY:      Family History  Problem Relation Age of Onset  . Leukemia Father   . Leukemia Brother     ADVANCED DIRECTIVES (Y/N):  N  HEALTH MAINTENANCE:     Social History  Substance Use Topics  . Smoking status: Never Smoker  . Smokeless tobacco: Never Used  . Alcohol use Yes                Colonoscopy:             PAP:             Bone density:             Lipid panel:       Allergies  Allergen Reactions  . Cardura [Doxazosin] Other (See Comments)  . Other Swelling    Fish- facial swelling          Current Outpatient Prescriptions  Medication Sig Dispense Refill  . amLODipine (NORVASC) 10 MG tablet Take 10 mg by mouth daily.    Marland Kitchen aspirin EC 81 MG tablet Take 81 mg by mouth daily.    Marland Kitchen  Cholecalciferol 2000 UNITS CAPS Take 2,000 Units by mouth daily.    . enalapril (VASOTEC) 20 MG tablet Take 20 mg by mouth daily.    . finasteride (PROSCAR) 5 MG tablet Take 5 mg by mouth daily.    Marland Kitchen glimepiride (AMARYL) 4 MG tablet Take 2 mg by mouth daily with breakfast.    . hydrALAZINE (APRESOLINE) 100 MG tablet Take 100 mg by mouth 3 (three) times daily.    Marland Kitchen loratadine (CLARITIN) 10 MG tablet Take 10 mg by mouth daily.    Marland Kitchen lovastatin (MEVACOR) 20 MG tablet Take 20 mg by mouth at bedtime.    . metoprolol (LOPRESSOR) 50 MG tablet Take 50 mg by mouth 2 (two) times daily.     No current facility-administered medications for this visit.     OBJECTIVE:    Vitals:   02/26/17 1021  BP: (!) 166/68    Pulse: 68  Resp: 20  Temp: (!) 96 F (35.6 C)     Body mass index is 36.9 kg/m.    ECOG FS:0 - Asymptomatic  General: Well-developed, well-nourished, no acute distress. Eyes: Pink conjunctiva, anicteric sclera. Lungs: Clear to auscultation bilaterally. Heart: Regular rate and rhythm. No rubs, murmurs, or gallops. Abdomen: Soft, nontender, nondistended. No organomegaly noted, normoactive bowel sounds. Musculoskeletal: No edema, cyanosis, or clubbing. Neuro: Alert, answering all questions appropriately. Cranial nerves grossly intact. Skin: No rashes or petechiae noted. Psych: Normal affect.   LAB RESULTS:  RecentLabs       Lab Results  Component Value Date   NA 141 12/13/2016   K 5.0 12/13/2016   CL 114 (H) 12/13/2016   CO2 20 (L) 10/14/2012   GLUCOSE 127 (H) 12/13/2016   BUN 66 (H) 12/13/2016   CREATININE 3.00 (H) 12/13/2016   CALCIUM 9.2 10/14/2012   PROT 7.3 10/14/2012   ALBUMIN 2.4 (L) 10/14/2012   AST 15 10/14/2012   ALT 15 10/14/2012   ALKPHOS 84 10/14/2012   BILITOT 0.3 10/14/2012   GFRNONAA 25 (L) 10/14/2012   GFRAA 29 (L) 10/14/2012      RecentLabs       Lab Results  Component Value Date   WBC 5.9 02/26/2017   NEUTROABS 4.0 02/26/2017   HGB 9.7 (L) 02/26/2017   HCT 28.6 (L) 02/26/2017   MCV 89.7 02/26/2017   PLT 131 (L) 02/26/2017     RecentLabs       Lab Results  Component Value Date   IRON 60 05/08/2016   TIBC 268 05/08/2016   IRONPCTSAT 22 05/08/2016     RecentLabs       Lab Results  Component Value Date   FERRITIN 227 05/08/2016       STUDIES: ImagingResults  No results found.    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  PLAN:    1. 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 Patient's hemoglobin is 9.7 today, therefore he Will receive 40,000 units  subcutaneous Procrit today. Previously, the remainder of his laboratory work including iron stores was either negative or within normal limits. Patient last received Procrit in November 2017. It appears patient only requires injections every 6 months, therefore return to clinic in 6 months for repeat laboratory work and further evaluation.  2. Thrombocytopenia: Mild and relatively unchanged. Previously, the remainder of his laboratory work was either negative or within normal limits. No intervention is needed at this time. Patient does not require  bone marrow biopsy. 3. Chronic renal failure: Patient's creatinine appears to be at his baseline. Continue monitor per nephrology.  4. Hypertension: Patient's blood pressure is elevated today. Continue current medications and monitoring by PCP.  Patient expressed understanding and was in agreement with this plan. He also understands that He can call clinic at any time with any questions, concerns, or complaints.   Lloyd Huger, MD   02/26/2017 10:44 AM        Electronically signed by Lloyd Huger, MD at 02/26/2017 10:46 AM      Office Visit on 02/26/2017        Detailed Report

## 2017-03-19 NOTE — Patient Instructions (Addendum)
  Your procedure is scheduled on: 03-27-17 TUESDAY Report to Same Day Surgery 2nd floor medical mall [facility] Entrance-take elevator on left to 2nd floor.  Check in with surgery information desk.) To find out your arrival time please call [phone] between 1PM - 3PM on 03-26-17 MONDAY  Remember: Instructions that are not followed completely may result in serious medical risk, up to and including death, or upon the discretion of your surgeon and anesthesiologist your surgery may need to be rescheduled.    _x___ 1. Do not eat food or drink liquids after midnight. No gum chewing or hard candies.     __x__ 2. No Alcohol for 24 hours before or after surgery.   __x__3. No Smoking for 24 prior to surgery.   ____  4. Bring all medications with you on the day of surgery if instructed.    __x__ 5. Notify your doctor if there is any change in your medical condition     (cold, fever, infections).     Do not wear jewelry, make-up, hairpins, clips or nail polish.  Do not wear lotions, powders, or perfumes. You may wear deodorant.  Do not shave 48 hours prior to surgery. Men may shave face and neck.  Do not bring valuables to the hospital.    [facility] is not responsible for any belongings or valuables.               Contacts, dentures or bridgework may not be worn into surgery.  Leave your suitcase in the car. After surgery it may be brought to your room.  For patients admitted to the hospital, discharge time is determined by your treatment team.   Patients discharged the day of surgery will not be allowed to drive home.  You will need someone to drive you home and stay with you the night of your procedure.    Please read over the following fact sheets that you were given:     _x___ Hardy WITH A SMALL SIP OF WATER. These include:  1. HYDRALAZINE  2. METOPROLOL  3.  4.  5.  6.  _X___Fleets enema or Magnesium Citrate as directed-DO  FLEETS ENEMA AT [place] 1 HOUR PRIOR TO ARRIVAL TIME TO HOSPITAL   ____ Use CHG Soap or sage wipes as directed on instruction sheet   ____ Use inhalers on the day of surgery and bring to hospital day of surgery  ____ Stop Metformin and Janumet 2 days prior to surgery.    ____ Take 1/2 of usual insulin dose the night before surgery and none on the morning surgery.   _x___ Follow recommendations from Cardiologist, Pulmonologist or PCP regarding stopping Aspirin, Coumadin, Pllavix ,Eliquis, Effient, or Pradaxa, and Pletal-STOP ASPIRIN NOW  X____Stop Anti-inflammatories such as Advil, Aleve, Ibuprofen, Motrin, Naproxen, Naprosyn, Goodies powders or aspirin products NOW-OK to take Tylenol   ____ Stop supplements until after surgery.   ____ Bring C-Pap to the hospital.

## 2017-03-20 ENCOUNTER — Encounter
Admission: RE | Admit: 2017-03-20 | Discharge: 2017-03-20 | Disposition: A | Discharge status: Home / Self Care | Payer: Medicare Other | Source: Ambulatory Visit | Attending: Urology | Admitting: Urology

## 2017-03-20 DIAGNOSIS — I10 Essential (primary) hypertension: Secondary | ICD-10-CM | POA: Diagnosis not present

## 2017-03-20 DIAGNOSIS — Z01810 Encounter for preprocedural cardiovascular examination: Secondary | ICD-10-CM | POA: Diagnosis not present

## 2017-03-20 DIAGNOSIS — R001 Bradycardia, unspecified: Secondary | ICD-10-CM | POA: Diagnosis not present

## 2017-03-26 MED ORDER — GENTAMICIN SULFATE 40 MG/ML IJ SOLN
80.0000 mg | Freq: Once | INTRAVENOUS | Status: DC
  Filled 2017-03-26: qty 2

## 2017-03-27 ENCOUNTER — Ambulatory Visit
Admission: RE | Admit: 2017-03-27 | Discharge: 2017-03-27 | Disposition: A | Discharge status: Home / Self Care | Payer: Medicare Other | Source: Ambulatory Visit | Attending: Urology | Admitting: Urology

## 2017-03-27 ENCOUNTER — Ambulatory Visit: Payer: Medicare Other | Admitting: Anesthesiology

## 2017-03-27 ENCOUNTER — Encounter
Admission: RE | Disposition: A | Discharge status: Home / Self Care | Payer: Self-pay | Source: Ambulatory Visit | Attending: Urology

## 2017-03-27 ENCOUNTER — Encounter: Payer: Self-pay | Admitting: *Deleted

## 2017-03-27 DIAGNOSIS — Z888 Allergy status to other drugs, medicaments and biological substances status: Secondary | ICD-10-CM | POA: Insufficient documentation

## 2017-03-27 DIAGNOSIS — E1122 Type 2 diabetes mellitus with diabetic chronic kidney disease: Secondary | ICD-10-CM | POA: Insufficient documentation

## 2017-03-27 DIAGNOSIS — K219 Gastro-esophageal reflux disease without esophagitis: Secondary | ICD-10-CM | POA: Insufficient documentation

## 2017-03-27 DIAGNOSIS — Z7982 Long term (current) use of aspirin: Secondary | ICD-10-CM | POA: Diagnosis not present

## 2017-03-27 DIAGNOSIS — M109 Gout, unspecified: Secondary | ICD-10-CM | POA: Insufficient documentation

## 2017-03-27 DIAGNOSIS — Z7984 Long term (current) use of oral hypoglycemic drugs: Secondary | ICD-10-CM | POA: Insufficient documentation

## 2017-03-27 DIAGNOSIS — E785 Hyperlipidemia, unspecified: Secondary | ICD-10-CM | POA: Insufficient documentation

## 2017-03-27 DIAGNOSIS — G4730 Sleep apnea, unspecified: Secondary | ICD-10-CM | POA: Insufficient documentation

## 2017-03-27 DIAGNOSIS — Z79899 Other long term (current) drug therapy: Secondary | ICD-10-CM | POA: Diagnosis not present

## 2017-03-27 DIAGNOSIS — M1990 Unspecified osteoarthritis, unspecified site: Secondary | ICD-10-CM | POA: Insufficient documentation

## 2017-03-27 DIAGNOSIS — N411 Chronic prostatitis: Secondary | ICD-10-CM | POA: Insufficient documentation

## 2017-03-27 DIAGNOSIS — I129 Hypertensive chronic kidney disease with stage 1 through stage 4 chronic kidney disease, or unspecified chronic kidney disease: Secondary | ICD-10-CM | POA: Diagnosis not present

## 2017-03-27 DIAGNOSIS — N184 Chronic kidney disease, stage 4 (severe): Secondary | ICD-10-CM | POA: Diagnosis not present

## 2017-03-27 DIAGNOSIS — R972 Elevated prostate specific antigen [PSA]: Secondary | ICD-10-CM | POA: Insufficient documentation

## 2017-03-27 HISTORY — PX: PROSTATE BIOPSY: SHX241

## 2017-03-27 LAB — GLUCOSE, CAPILLARY
Glucose-Capillary: 124 mg/dL — ABNORMAL HIGH (ref 65–99)
Glucose-Capillary: 108 mg/dL — ABNORMAL HIGH (ref 65–99)

## 2017-03-27 SURGERY — BIOPSY, PROSTATE
Anesthesia: General | Site: Prostate | Wound class: Dirty or Infected

## 2017-03-27 MED ORDER — FAMOTIDINE 20 MG PO TABS
20.0000 mg | ORAL_TABLET | Freq: Once | ORAL | Status: AC
  Administered 2017-03-27: 20 mg via ORAL

## 2017-03-27 MED ORDER — PROPOFOL 10 MG/ML IV BOLUS
INTRAVENOUS | Status: DC | PRN
  Administered 2017-03-27: 160 mg via INTRAVENOUS

## 2017-03-27 MED ORDER — FENTANYL CITRATE (PF) 100 MCG/2ML IJ SOLN: 25.0000 ug | INTRAMUSCULAR | Status: DC | PRN

## 2017-03-27 MED ORDER — CIPROFLOXACIN HCL 500 MG PO TABS: 500.0000 mg | ORAL_TABLET | Freq: Two times a day (BID) | ORAL | 0 refills | Status: DC

## 2017-03-27 MED ORDER — MIDAZOLAM HCL 2 MG/2ML IJ SOLN
INTRAMUSCULAR | Status: AC
  Filled 2017-03-27: qty 2

## 2017-03-27 MED ORDER — GLYCOPYRROLATE 0.2 MG/ML IJ SOLN
0.2000 mg | Freq: Once | INTRAMUSCULAR | Status: AC
  Administered 2017-03-27: 0.2 mg via INTRAVENOUS

## 2017-03-27 MED ORDER — ONDANSETRON HCL 4 MG/2ML IJ SOLN: 4.0000 mg | Freq: Once | INTRAMUSCULAR | Status: DC | PRN

## 2017-03-27 MED ORDER — GENTAMICIN IN SALINE 1.6-0.9 MG/ML-% IV SOLN
80.0000 mg | Freq: Once | INTRAVENOUS | Status: AC
Start: 2017-03-27 — End: 2017-03-27
  Administered 2017-03-27: 80 mg via INTRAVENOUS
  Filled 2017-03-27: qty 50

## 2017-03-27 MED ORDER — ONDANSETRON HCL 4 MG/2ML IJ SOLN
INTRAMUSCULAR | Status: DC | PRN
  Administered 2017-03-27: 4 mg via INTRAVENOUS

## 2017-03-27 MED ORDER — EPHEDRINE SULFATE 50 MG/ML IJ SOLN
INTRAMUSCULAR | Status: DC | PRN
  Administered 2017-03-27: 10 mg via INTRAVENOUS

## 2017-03-27 MED ORDER — FAMOTIDINE 20 MG PO TABS
ORAL_TABLET | ORAL | Status: AC
  Administered 2017-03-27: 20 mg via ORAL
  Filled 2017-03-27: qty 1

## 2017-03-27 MED ORDER — ONDANSETRON HCL 4 MG/2ML IJ SOLN
INTRAMUSCULAR | Status: AC
Start: 2017-03-27 — End: ?
  Filled 2017-03-27: qty 2

## 2017-03-27 MED ORDER — LIDOCAINE HCL (CARDIAC) 20 MG/ML IV SOLN
INTRAVENOUS | Status: DC | PRN
  Administered 2017-03-27: 65 mg via INTRAVENOUS

## 2017-03-27 MED ORDER — FLEET ENEMA 7-19 GM/118ML RE ENEM: 1.0000 | ENEMA | Freq: Once | RECTAL | Status: DC

## 2017-03-27 MED ORDER — SODIUM CHLORIDE 0.9 % IV SOLN
INTRAVENOUS | Status: DC
  Administered 2017-03-27 (×2): via INTRAVENOUS

## 2017-03-27 MED ORDER — FENTANYL CITRATE (PF) 100 MCG/2ML IJ SOLN
INTRAMUSCULAR | Status: AC
  Filled 2017-03-27: qty 2

## 2017-03-27 MED ORDER — MIDAZOLAM HCL 2 MG/2ML IJ SOLN
INTRAMUSCULAR | Status: DC | PRN
  Administered 2017-03-27: 2 mg via INTRAVENOUS

## 2017-03-27 MED ORDER — GLYCOPYRROLATE 0.2 MG/ML IJ SOLN
INTRAMUSCULAR | Status: AC
  Filled 2017-03-27: qty 1

## 2017-03-27 MED ORDER — LACTATED RINGERS IV SOLN: INTRAVENOUS | Status: DC | PRN

## 2017-03-27 MED ORDER — FENTANYL CITRATE (PF) 100 MCG/2ML IJ SOLN
INTRAMUSCULAR | Status: DC | PRN
Start: 2017-03-27 — End: 2017-03-27
  Administered 2017-03-27: 25 ug via INTRAVENOUS
  Administered 2017-03-27: 50 ug via INTRAVENOUS
  Administered 2017-03-27: 25 ug via INTRAVENOUS

## 2017-03-27 MED ORDER — PROPOFOL 10 MG/ML IV BOLUS
INTRAVENOUS | Status: AC
  Filled 2017-03-27: qty 20

## 2017-03-27 SURGICAL SUPPLY — 22 items
BAG URO DRAIN 2000ML W/SPOUT (MISCELLANEOUS) IMPLANT
BLADE CLIPPER SURG (BLADE) IMPLANT
DRAPE SHEET LG 3/4 BI-LAMINATE (DRAPES) IMPLANT
DRSG TELFA 3X8 NADH (GAUZE/BANDAGES/DRESSINGS) ×3 IMPLANT
DRSG TELFA 4X3 1S NADH ST (GAUZE/BANDAGES/DRESSINGS) IMPLANT
GAUZE SPONGE 4X4 12PLY STRL (GAUZE/BANDAGES/DRESSINGS) IMPLANT
GLOVE BIO SURGEON STRL SZ 6.5 (GLOVE) ×2 IMPLANT
GLOVE BIO SURGEON STRL SZ7 (GLOVE) ×3 IMPLANT
GLOVE BIO SURGEONS STRL SZ 6.5 (GLOVE) ×1
GUIDE NEEDLE ENDOCAV 16-18 CVR (NEEDLE) ×3 IMPLANT
INST BIOPSY MAXCORE 18GX25 (NEEDLE) ×3 IMPLANT
KIT RM TURNOVER CYSTO AR (KITS) ×3 IMPLANT
NDL DEFLUX 3.7X23X350 (MISCELLANEOUS)
NDL SAFETY 18GX1.5 (NEEDLE) ×3 IMPLANT
NDL SAFETY 22GX1.5 (NEEDLE) IMPLANT
NEEDLE DEFLUX 3.7X23X350 (MISCELLANEOUS) IMPLANT
NEEDLE GUIDE BIOPSY 644068 (NEEDLE) ×3 IMPLANT
SYRINGE 10CC LL (SYRINGE) IMPLANT
SYRINGE IRR TOOMEY STRL 70CC (SYRINGE) IMPLANT
SYS ULTRASOUND URONAV MRI FUS (MISCELLANEOUS) ×3
SYSTEM ULTRASND URONAV MRI FUS (MISCELLANEOUS) ×1 IMPLANT
WATER STERILE IRR 1000ML POUR (IV SOLUTION) IMPLANT

## 2017-03-27 NOTE — Anesthesia Preprocedure Evaluation (Signed)
Anesthesia Evaluation  Patient identified by MRN, date of birth, ID band Patient awake    Reviewed: Allergy & Precautions, H&P , NPO status , Patient's Chart, lab work & pertinent test results, reviewed documented beta blocker date and time   Airway Mallampati: II  TM Distance: >3 FB Neck ROM: full    Dental  (+) Teeth Intact   Pulmonary neg pulmonary ROS, sleep apnea ,    Pulmonary exam normal        Cardiovascular Exercise Tolerance: Good hypertension, negative cardio ROS Normal cardiovascular exam Rate:Normal     Neuro/Psych negative neurological ROS  negative psych ROS   GI/Hepatic negative GI ROS, Neg liver ROS, GERD  Medicated,  Endo/Other  negative endocrine ROSdiabetes  Renal/GU CRFRenal diseasenegative Renal ROS  negative genitourinary   Musculoskeletal   Abdominal   Peds  Hematology negative hematology ROS (+) anemia ,   Anesthesia Other Findings   Reproductive/Obstetrics negative OB ROS                             Anesthesia Physical Anesthesia Plan  ASA: III  Anesthesia Plan: General LMA   Post-op Pain Management:    Induction:   PONV Risk Score and Plan: 3 and Ondansetron, Dexamethasone, Midazolam and Propofol infusion  Airway Management Planned:   Additional Equipment:   Intra-op Plan:   Post-operative Plan:   Informed Consent: I have reviewed the patients History and Physical, chart, labs and discussed the procedure including the risks, benefits and alternatives for the proposed anesthesia with the patient or authorized representative who has indicated his/her understanding and acceptance.     Plan Discussed with: CRNA  Anesthesia Plan Comments:         Anesthesia Quick Evaluation

## 2017-03-27 NOTE — Discharge Instructions (Signed)
Transrectal Ultrasound-Guided Biopsy °A transrectal ultrasound-guided biopsy is a procedure to remove samples of tissue from your prostate using ultrasound images to guide the procedure. The procedure is usually done to evaluate the prostate gland of men who have an elevated prostate-specific antigen (PSA). PSA is a blood test to screen for prostate cancer. The biopsy samples are taken to check for prostate cancer. °Tell a health care provider about: °· Any allergies you have. °· All medicines you are taking, including vitamins, herbs, eye drops, creams, and over-the-counter medicines. °· Any problems you or family members have had with anesthetic medicines. °· Any blood disorders you have. °· Any surgeries you have had. °· Any medical conditions you have. °What are the risks? °Generally, this is a safe procedure. However, as with any procedure, problems can occur. Possible problems include: °· Infection of your prostate. °· Bleeding from your rectum or blood in your urine. °· Difficulty urinating. °· Nerve damage (this is usually temporary). °· Damage to surrounding structures such as blood vessels, organs, and muscles, which would require other procedures. ° °What happens before the procedure? °· Do not eat or drink anything after midnight on the night before the procedure or as directed by your health care provider. °· Take medicines only as directed by your health care provider. °· Your health care provider may have you stop taking certain medicines 5-7 days before the procedure. °· You will be given an enema before the procedure. During an enema, a liquid is injected into your rectum to clear out waste. °· You may have lab tests the day of your procedure. °· Plan to have someone take you home after the procedure. °What happens during the procedure? °· You will be given medicine to help you relax (sedative) before the procedure. An IV tube will be inserted into one of your veins and used to give fluids and  medicine. °· You will be given antibiotic medicine to reduce the risk of an infection. °· You will be placed on your side for the procedure. °· A probe with lubricated gel will be placed into your rectum, and images will be taken of your prostate and surrounding structures. °· Numbing medicine will be injected into the prostate before the biopsy samples are taken. °· A biopsy needle will then be inserted and guided to your prostate with the use of the ultrasound images. °· Samples of prostate tissue will be taken, and the needle will then be removed. °· The biopsy samples will be sent to a lab to be analyzed. Results are usually back in 2-3 days. °What happens after the procedure? °· You will be taken to a recovery area where you will be monitored. °· You may have some discomfort in the rectal area. You will be given pain medicines to control this. °· You may be allowed to go home the same day, or you may need to stay in the hospital overnight. °This information is not intended to replace advice given to you by your health care provider. Make sure you discuss any questions you have with your health care provider. °Document Released: 12/22/2013 Document Revised: 01/13/2016 Document Reviewed: 03/26/2013 °Elsevier Interactive Patient Education © 2018 [employer] ° °

## 2017-03-27 NOTE — H&P (Signed)
Date of Initial H&P: 03/26/17  History reviewed, patient examined, no change in status, stable for surgery.

## 2017-03-27 NOTE — Op Note (Signed)
Preoperative diagnosis: 1. Elevated PSA                                             2.  PI-RADS  grade 3 lesion left lateral mid peripheral gland                                               Postoperative diagnosis: Same  Procedure: Targeted transrectal ultrasound prostate biopsy with MRI ultrasound fusion using Uro-Nav    Surgeon: Otelia Limes. Yves Dill MD  Anesthesia: General  Indications:See the history and physical. After informed consent the above procedure(s) were requested     Technique and findings: After adequate general anesthesia been obtained patient was placed into lateral decubitus position. The transrectal ultrasound probe was advanced into the rectal canal and prostate gland was imaged. Transverse and sagittal images were acquired. The ultrasound images were overlaid with the MRI images. The PI-RADS 3 lesion was identified and 3 core biopsies obtained from this lesion. At this point standard 12 core biopsies were obtained. Transrectal ultrasound probe was removed. The procedure was then terminated. Estimated blood loss was less than 10 cc. The patient was then transferred to the recovery room in stable condition.

## 2017-03-27 NOTE — Transfer of Care (Signed)
Immediate Anesthesia Transfer of Care Note  Patient: [name] [name]  Procedure(s) Performed: Procedure(s): PROSTATE BIOPSY-URO NAV (N/A)  Patient Location: PACU  Anesthesia Type:General  Level of Consciousness: sedated  Airway & Oxygen Therapy: Patient Spontanous Breathing and Patient connected to face mask oxygen  Post-op Assessment: Report given to RN and Post -op Vital signs reviewed and stable  Post vital signs: Reviewed and stable  Last Vitals:  Vitals:   03/27/17 1151 03/27/17 1412  BP: (!) 177/81   Pulse: 63 (P) 60  Resp: 18   Temp: (!) 36.3 C (!) (P) 36.1 C    Last Pain:  Vitals:   03/27/17 1151  TempSrc: Tympanic  PainSc: 0-No pain         Complications: No apparent anesthesia complications

## 2017-03-27 NOTE — Anesthesia Procedure Notes (Signed)
Procedure Name: LMA Insertion Date/Time: 03/27/2017 1:10 PM Performed by: Connie Lasater Pre-anesthesia Checklist: Patient identified, Emergency Drugs available, Suction available, Patient being monitored and Timeout performed Patient Re-evaluated:Patient Re-evaluated prior to induction Oxygen Delivery Method: Circle system utilized Preoxygenation: Pre-oxygenation with 100% oxygen Induction Type: IV induction Ventilation: Mask ventilation without difficulty LMA: LMA inserted LMA Size: 5.0 Placement Confirmation: positive ETCO2 Tube secured with: Tape Dental Injury: Teeth and Oropharynx as per pre-operative assessment

## 2017-03-27 NOTE — OR Nursing (Signed)
All specimens taken during procedure were properly labeled and left the OR with Dr.Wolff.

## 2017-03-27 NOTE — Anesthesia Post-op Follow-up Note (Signed)
Anesthesia QCDR form completed.        

## 2017-03-27 NOTE — H&P (Signed)
[name] [name], [name] [name] NO.:  [id number]  MEDICAL RECORD NO.:  [id number]  LOCATION:                                 FACILITY:  PHYSICIAN:  Maryan Puls          DATE OF BIRTH:  1941/04/26  DATE OF ADMISSION:  03/27/2017 DATE OF DISCHARGE:                            HISTORY AND PHYSICAL   SAME DAY SURGERY:  March 27, 2017.  CHIEF COMPLAINT:  Elevated PSA.  HISTORY OF PRESENT ILLNESS:  [name] [name] is a 76 year old African American male with an elevated PSA of 15.7 ng/mL on November 28, 2016.  He was evaluated with prostate MRI scan on December 13, 2016, which indicated a PI-RADS grade 3, 5 x 8 mm lesion in the left lateral mid peripheral gland.  The patient comes in now for targeted biopsy using the [employer] system.  He has a history of 2 prior negative biopsies.  ALLERGIES:  THE PATIENT ALLERGIC TO CARDURA.  CURRENT MEDICATIONS:  Glimepiride, vitamin D, metoprolol, amlodipine, Uloric, aspirin, lovastatin, enalapril, Claritin, hydralazine, and finasteride.  PAST SURGICAL HISTORY: 1. Appendectomy. 2. Colonoscopy. 3. Inguinal herniorrhaphy. 4. Repair of retinal detachment. 5. TUMT.  PAST MEDICAL HISTORY: 1. Stage 4 chronic renal disease. 2. Anemia. 3. Arthritis. 4. Diabetes. 5. Gout. 6. Hyperlipidemia. 7. Hypertension. 8. Sleep apnea.  REVIEW OF SYSTEMS:  The patient denies chest pain, shortness of breath, or stroke.  He denies history of coronary artery disease.  SOCIAL HISTORY:  The patient had tobacco use or alcohol use.  FAMILY HISTORY:  Father died with leukemia.  He also has a brother with leukemia.  PHYSICAL EXAMINATION:  GENERAL:  Obese African American male in no distress. HEENT:  Sclerae were clear. NECK:  Supple.  No palpable cervical adenopathy. LUNGS:  Clear to auscultation. CARDIOVASCULAR:  Regular rhythm and rate without audible murmurs. ABDOMEN:  Soft, nontender abdomen. GU:  Uncircumcised testes, smooth, nontender. RECTAL:  40  g, smooth, nontender prostate. NEUROMUSCULAR:  Alert and oriented x3.  IMPRESSION: 1. Elevated prostate specific antigen. 2. PI-RADS class 3 left mid peripheral gland lesion on MRI.  PLAN:  Targeted biopsy using [employer] system.    ______________________________ Maryan Puls   ______________________________ Maryan Puls    MW/MEDQ  D:  03/26/2017  T:  03/26/2017  Job:  [id number]

## 2017-03-28 ENCOUNTER — Encounter: Payer: Self-pay | Admitting: Urology

## 2017-04-12 NOTE — Anesthesia Postprocedure Evaluation (Signed)
Anesthesia Post Note  Patient: [name] [name]  Procedure(s) Performed: Procedure(s) (LRB): PROSTATE BIOPSY-URO NAV (N/A)  Patient location during evaluation: PACU Anesthesia Type: General Level of consciousness: awake and alert Pain management: pain level controlled Vital Signs Assessment: post-procedure vital signs reviewed and stable Respiratory status: spontaneous breathing, nonlabored ventilation, respiratory function stable and patient connected to nasal cannula oxygen Cardiovascular status: blood pressure returned to baseline and stable Postop Assessment: no signs of nausea or vomiting Anesthetic complications: no     Last Vitals:  Vitals:   03/27/17 1540 03/27/17 1619  BP: (!) 140/95 (!) 149/82  Pulse: 70 69  Resp: 16 17  Temp: (!) 35.7 C   SpO2: 99% 100%    Last Pain:  Vitals:   03/28/17 0815  TempSrc:   PainSc: 0-No pain                 Molli Barrows

## 2017-08-26 NOTE — Progress Notes (Signed)
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  INTERVAL HISTORY: Patient returns to clinic today for repeat laboratory work, further evaluation, and consideration of additional Procrit. He continues to feel well and is asymptomatic. He does not complain of weakness or fatigue today. He has no neurologic complaints. He denies any recent fevers or illnesses. He has a good appetite and denies weight loss. He denies any chest pain or shortness of breath. He denies any nausea, vomiting, constipation, or diarrhea. He denies any melena or hematochezia.  He has no urinary complaints. Patient offers no specific complaints today.  REVIEW OF SYSTEMS:   Review of Systems  Constitutional: Negative for fever, malaise/fatigue and weight loss.  Respiratory: Negative.  Negative for cough and shortness of breath.   Cardiovascular: Negative.  Negative for chest pain and leg swelling.  Gastrointestinal: Negative.  Negative for abdominal pain, blood in stool and melena.  Genitourinary: Negative.  Negative for hematuria.  Musculoskeletal: Negative.   Skin: Negative.  Negative for rash.  Neurological: Negative.  Negative for sensory change and weakness.  Psychiatric/Behavioral: Negative.  The patient is not nervous/anxious.     As per HPI. Otherwise, a complete review of systems is negative.  PAST MEDICAL HISTORY: Past Medical History:  Diagnosis Date  . Anemia   . Arthritis   . Chronic kidney disease    Stage 4 CKD  . Diabetes mellitus without complication ([place])   . GERD (gastroesophageal reflux disease)    h/o  . Gout   . Hyperlipidemia, mixed   . Hypertension   . Sleep apnea    h/o no cpap    PAST SURGICAL HISTORY: Past Surgical  History:  Procedure Laterality Date  . APPENDECTOMY    . Bunion Removal Right   . COLONOSCOPY WITH PROPOFOL N/A 06/14/2015   Procedure: COLONOSCOPY WITH PROPOFOL;  Surgeon: Lollie Sails, MD;  Location: [facility] ENDOSCOPY;  Service: Endoscopy;  Laterality: N/A;  . HERNIA REPAIR    . PROSTATE BIOPSY N/A 03/27/2017   Procedure: PROSTATE BIOPSY-URO NAV;  Surgeon: Royston Cowper, MD;  Location: [facility] ORS;  Service: Urology;  Laterality: N/A;  . RETINAL DETACHMENT SURGERY Left   . TRANSURETHRAL RESECTION OF PROSTATE      FAMILY HISTORY: Family History  Problem Relation Age of Onset  . Leukemia Father   . Leukemia Brother     ADVANCED DIRECTIVES (Y/N):  N  HEALTH MAINTENANCE: Social History   Tobacco Use  . Smoking status: Never Smoker  . Smokeless tobacco: Never Used  Substance Use Topics  . Alcohol use: Yes    Comment: occ  . Drug use: No     Colonoscopy:  PAP:  Bone density:  Lipid panel:  Allergies  Allergen Reactions  . Cardura [Doxazosin] Other (See Comments)    unknown  . Shellfish Allergy Swelling    Current Outpatient Medications  Medication Sig Dispense Refill  . acetaminophen (TYLENOL) 500 MG tablet Take 500 mg by mouth every 8 (eight) hours as needed for mild pain or headache.    Marland Kitchen amLODipine (NORVASC) 10 MG tablet Take 10 mg by mouth at bedtime.     Marland Kitchen aspirin EC 81 MG tablet Take 81 mg by mouth daily.    . bicalutamide (CASODEX) 50 MG tablet Take 50 mg by  mouth daily.     . calcitRIOL (ROCALTROL) 0.25 MCG capsule Take 0.25 mcg by mouth daily.     . Cholecalciferol 2000 UNITS CAPS Take 2,000 Units by mouth daily.    . enalapril (VASOTEC) 20 MG tablet Take 20 mg by mouth at bedtime.     . finasteride (PROSCAR) 5 MG tablet Take 5 mg by mouth at bedtime.     . fluticasone (FLONASE) 50 MCG/ACT nasal spray Place 1 spray into both nostrils at bedtime.    Marland Kitchen glimepiride (AMARYL) 4 MG tablet Take 2 mg by mouth daily with breakfast.    . glucose blood (ONE TOUCH  ULTRA TEST) test strip Use as directed. Check CBG's fasting daily. Dx: E11.22, N18.4    . hydrALAZINE (APRESOLINE) 100 MG tablet Take 100 mg by mouth 3 (three) times daily.    . Insulin Pen Needle (NOVOTWIST) 32G X 5 MM MISC Use 1 Units once daily. Dx: E11.9    . loratadine (CLARITIN) 10 MG tablet Take 10 mg by mouth daily as needed for allergies.     Marland Kitchen lovastatin (MEVACOR) 20 MG tablet Take 20 mg by mouth at bedtime.    . metoprolol (LOPRESSOR) 50 MG tablet Take 50 mg by mouth 2 (two) times daily.    . sodium bicarbonate 650 MG tablet Take 1,300 mg by mouth 2 (two) times daily.    Marland Kitchen trolamine salicylate (ASPERCREME) 10 % cream Apply 1 application topically as needed for muscle pain.    Marland Kitchen ULORIC 40 MG tablet Take 40 mg by mouth daily.     . ciprofloxacin (CIPRO) 500 MG tablet Take 1 tablet (500 mg total) by mouth 2 (two) times daily. (Patient not taking: Reported on 08/27/2017) 6 tablet 0   No current facility-administered medications for this visit.     OBJECTIVE: Vitals:   08/27/17 1101  BP: (!) 171/86  Pulse: 64  Resp: 18  Temp: 97.9 F (36.6 C)  SpO2: 99%     Body mass index is 36.29 kg/m.    ECOG FS:0 - Asymptomatic  General: Well-developed, well-nourished, no acute distress. Eyes: Pink conjunctiva, anicteric sclera. Lungs: Clear to auscultation bilaterally. Heart: Regular rate and rhythm. No rubs, murmurs, or gallops. Abdomen: Soft, nontender, nondistended. No organomegaly noted, normoactive bowel sounds. Musculoskeletal: No edema, cyanosis, or clubbing. Neuro: Alert, answering all questions appropriately. Cranial nerves grossly intact. Skin: No rashes or petechiae noted. Psych: Normal affect.   LAB RESULTS:  Lab Results  Component Value Date   NA 141 12/13/2016   K 5.0 12/13/2016   CL 114 (H) 12/13/2016   CO2 20 (L) 10/14/2012   GLUCOSE 127 (H) 12/13/2016   BUN 66 (H) 12/13/2016   CREATININE 3.00 (H) 12/13/2016   CALCIUM 9.2 10/14/2012   PROT 7.3 10/14/2012    ALBUMIN 2.4 (L) 10/14/2012   AST 15 10/14/2012   ALT 15 10/14/2012   ALKPHOS 84 10/14/2012   BILITOT 0.3 10/14/2012   GFRNONAA 25 (L) 10/14/2012   GFRAA 29 (L) 10/14/2012    Lab Results  Component Value Date   WBC 6.2 08/27/2017   NEUTROABS 4.1 08/27/2017   HGB 9.1 (L) 08/27/2017   HCT 27.2 (L) 08/27/2017   MCV 92.3 08/27/2017   PLT 122 (L) 08/27/2017   Lab Results  Component Value Date   IRON 52 02/26/2017   TIBC 244 (L) 02/26/2017   IRONPCTSAT 21 02/26/2017   Lab Results  Component Value Date   FERRITIN 173 02/26/2017     STUDIES:  No results found.  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  PLAN:    1. 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  Proceed with 40,000 units subcutaneous Procrit today.  Previously the remainder of his laboratory work, including iron stores, was either negative or within normal limits.  Patient was receiving Procrit injections approximately every 6 months, but given his worsening renal function and declining hemoglobin he likely will benefit from more frequent injections.  Return to clinic in 3 months for laboratory work and Procrit only if his hemoglobin is below 10.0.  Patient will then return to clinic in 6 months for laboratory work and further evaluation.   2. Thrombocytopenia: Mild and relatively unchanged. Previously, the remainder of his laboratory work was either negative or within normal limits. No intervention is needed at this time. Patient does not require bone marrow biopsy. 3. Chronic renal failure: Patient's creatinine appears to be slightly worse.  He reports that dialysis has been discussed, but not necessary at this point.  Continue follow-up with nephrology as scheduled.   4. Hypertension: Patient's blood pressure is elevated today. Continue current medications and monitoring by PCP.  Okay to proceed with Procrit.  Patient  expressed understanding and was in agreement with this plan. He also understands that He can call clinic at any time with any questions, concerns, or complaints.   Lloyd Huger, MD   08/27/2017 12:56 PM

## 2017-08-27 ENCOUNTER — Inpatient Hospital Stay: Payer: Medicare Other

## 2017-08-27 ENCOUNTER — Inpatient Hospital Stay (HOSPITAL_BASED_OUTPATIENT_CLINIC_OR_DEPARTMENT_OTHER): Payer: Medicare Other | Admitting: Oncology

## 2017-08-27 ENCOUNTER — Inpatient Hospital Stay: Payer: Medicare Other | Attending: Oncology

## 2017-08-27 VITALS — BP 171/70

## 2017-08-27 VITALS — BP 171/86 | HR 64 | Temp 97.9°F | Resp 18 | Wt 238.7 lb

## 2017-08-27 DIAGNOSIS — N183 Chronic kidney disease, stage 3 (moderate): Secondary | ICD-10-CM | POA: Insufficient documentation

## 2017-08-27 DIAGNOSIS — D631 Anemia in chronic kidney disease: Secondary | ICD-10-CM

## 2017-08-27 DIAGNOSIS — I10 Essential (primary) hypertension: Secondary | ICD-10-CM | POA: Diagnosis not present

## 2017-08-27 DIAGNOSIS — D696 Thrombocytopenia, unspecified: Secondary | ICD-10-CM | POA: Diagnosis not present

## 2017-08-27 DIAGNOSIS — D649 Anemia, unspecified: Secondary | ICD-10-CM

## 2017-08-27 LAB — CBC WITH DIFFERENTIAL/PLATELET
Basophils Absolute: 0 10*3/uL (ref 0–0.1)
Basophils Relative: 1 %
EOS ABS: 0.5 10*3/uL (ref 0–0.7)
Eosinophils Relative: 8 %
HEMATOCRIT: 27.2 % — AB (ref 40.0–52.0)
HEMOGLOBIN: 9.1 g/dL — AB (ref 13.0–18.0)
LYMPHS ABS: 1 10*3/uL (ref 1.0–3.6)
LYMPHS PCT: 17 %
MCH: 30.8 pg (ref 26.0–34.0)
MCHC: 33.3 g/dL (ref 32.0–36.0)
MCV: 92.3 fL (ref 80.0–100.0)
Monocytes Absolute: 0.5 10*3/uL (ref 0.2–1.0)
Monocytes Relative: 8 %
NEUTROS PCT: 66 %
Neutro Abs: 4.1 10*3/uL (ref 1.4–6.5)
Platelets: 122 10*3/uL — ABNORMAL LOW (ref 150–440)
RBC: 2.95 MIL/uL — AB (ref 4.40–5.90)
RDW: 14.3 % (ref 11.5–14.5)
WBC: 6.2 10*3/uL (ref 3.8–10.6)

## 2017-08-27 MED ORDER — EPOETIN ALFA 40000 UNIT/ML IJ SOLN
40000.0000 [IU] | Freq: Once | INTRAMUSCULAR | Status: AC
  Administered 2017-08-27: 40000 [IU] via SUBCUTANEOUS
  Filled 2017-08-27: qty 1

## 2017-08-27 NOTE — Progress Notes (Signed)
MD approves patient getting Procrit today with BP 171/70

## 2017-11-26 ENCOUNTER — Inpatient Hospital Stay: Payer: Medicare Other

## 2017-11-26 ENCOUNTER — Other Ambulatory Visit: Payer: Self-pay

## 2017-11-26 ENCOUNTER — Inpatient Hospital Stay: Payer: Medicare Other | Attending: Oncology

## 2017-11-26 VITALS — BP 159/70

## 2017-11-26 DIAGNOSIS — D631 Anemia in chronic kidney disease: Secondary | ICD-10-CM | POA: Insufficient documentation

## 2017-11-26 DIAGNOSIS — D696 Thrombocytopenia, unspecified: Secondary | ICD-10-CM

## 2017-11-26 DIAGNOSIS — N183 Chronic kidney disease, stage 3 unspecified: Secondary | ICD-10-CM

## 2017-11-26 LAB — IRON AND TIBC
Iron: 56 ug/dL (ref 45–182)
Saturation Ratios: 24 % (ref 17.9–39.5)
TIBC: 235 ug/dL — AB (ref 250–450)
UIBC: 179 ug/dL

## 2017-11-26 LAB — CBC WITH DIFFERENTIAL/PLATELET
BASOS ABS: 0 10*3/uL (ref 0–0.1)
BASOS PCT: 1 %
EOS ABS: 0.2 10*3/uL (ref 0–0.7)
EOS PCT: 4 %
HCT: 26 % — ABNORMAL LOW (ref 40.0–52.0)
HEMOGLOBIN: 8.9 g/dL — AB (ref 13.0–18.0)
Lymphocytes Relative: 21 %
Lymphs Abs: 1.3 10*3/uL (ref 1.0–3.6)
MCH: 31.1 pg (ref 26.0–34.0)
MCHC: 34.4 g/dL (ref 32.0–36.0)
MCV: 90.5 fL (ref 80.0–100.0)
Monocytes Absolute: 0.6 10*3/uL (ref 0.2–1.0)
Monocytes Relative: 10 %
NEUTROS PCT: 64 %
Neutro Abs: 3.8 10*3/uL (ref 1.4–6.5)
PLATELETS: 125 10*3/uL — AB (ref 150–440)
RBC: 2.87 MIL/uL — ABNORMAL LOW (ref 4.40–5.90)
RDW: 14.7 % — ABNORMAL HIGH (ref 11.5–14.5)
WBC: 5.9 10*3/uL (ref 3.8–10.6)

## 2017-11-26 LAB — FERRITIN: Ferritin: 192 ng/mL (ref 24–336)

## 2017-11-26 MED ORDER — EPOETIN ALFA 40000 UNIT/ML IJ SOLN
40000.0000 [IU] | Freq: Once | INTRAMUSCULAR | Status: AC
  Administered 2017-11-26: 40000 [IU] via SUBCUTANEOUS

## 2017-11-26 NOTE — Progress Notes (Unsigned)
Patient is here for injection, checked bp it was 163/66 rechecked and it was 159/70.

## 2018-02-24 NOTE — Progress Notes (Deleted)
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  INTERVAL HISTORY: Patient returns to clinic today for repeat laboratory work, further evaluation, and consideration of additional Procrit. He continues to feel well and is asymptomatic. He does not complain of weakness or fatigue today. He has no neurologic complaints. He denies any recent fevers or illnesses. He has a good appetite and denies weight loss. He denies any chest pain or shortness of breath. He denies any nausea, vomiting, constipation, or diarrhea. He denies any melena or hematochezia.  He has no urinary complaints. Patient offers no specific complaints today.  REVIEW OF SYSTEMS:   Review of Systems  Constitutional: Negative for fever, malaise/fatigue and weight loss.  Respiratory: Negative.  Negative for cough and shortness of breath.   Cardiovascular: Negative.  Negative for chest pain and leg swelling.  Gastrointestinal: Negative.  Negative for abdominal pain, blood in stool and melena.  Genitourinary: Negative.  Negative for hematuria.  Musculoskeletal: Negative.   Skin: Negative.  Negative for rash.  Neurological: Negative.  Negative for sensory change and weakness.  Psychiatric/Behavioral: Negative.  The patient is not nervous/anxious.     As per HPI. Otherwise, a complete review of systems is negative.  PAST MEDICAL HISTORY: Past Medical History:  Diagnosis Date  . Anemia   . Arthritis   . Chronic kidney disease    Stage 4 CKD  . Diabetes mellitus without complication ([place])   . GERD (gastroesophageal reflux disease)    h/o  . Gout   . Hyperlipidemia, mixed   . Hypertension   . Sleep apnea    h/o no cpap    PAST SURGICAL HISTORY: Past Surgical  History:  Procedure Laterality Date  . APPENDECTOMY    . Bunion Removal Right   . COLONOSCOPY WITH PROPOFOL N/A 06/14/2015   Procedure: COLONOSCOPY WITH PROPOFOL;  Surgeon: Lollie Sails, MD;  Location: [facility] ENDOSCOPY;  Service: Endoscopy;  Laterality: N/A;  . HERNIA REPAIR    . PROSTATE BIOPSY N/A 03/27/2017   Procedure: PROSTATE BIOPSY-URO NAV;  Surgeon: Royston Cowper, MD;  Location: [facility] ORS;  Service: Urology;  Laterality: N/A;  . RETINAL DETACHMENT SURGERY Left   . TRANSURETHRAL RESECTION OF PROSTATE      FAMILY HISTORY: Family History  Problem Relation Age of Onset  . Leukemia Father   . Leukemia Brother     ADVANCED DIRECTIVES (Y/N):  N  HEALTH MAINTENANCE: Social History   Tobacco Use  . Smoking status: Never Smoker  . Smokeless tobacco: Never Used  Substance Use Topics  . Alcohol use: Yes    Comment: occ  . Drug use: No     Colonoscopy:  PAP:  Bone density:  Lipid panel:  Allergies  Allergen Reactions  . Cardura [Doxazosin] Other (See Comments)    unknown  . Shellfish Allergy Swelling    Current Outpatient Medications  Medication Sig Dispense Refill  . acetaminophen (TYLENOL) 500 MG tablet Take 500 mg by mouth every 8 (eight) hours as needed for mild pain or headache.    Marland Kitchen amLODipine (NORVASC) 10 MG tablet Take 10 mg by mouth at bedtime.     Marland Kitchen aspirin EC 81 MG tablet Take 81 mg by mouth daily.    . bicalutamide (CASODEX) 50 MG tablet Take 50 mg by  mouth daily.     . calcitRIOL (ROCALTROL) 0.25 MCG capsule Take 0.25 mcg by mouth daily.     . Cholecalciferol 2000 UNITS CAPS Take 2,000 Units by mouth daily.    . ciprofloxacin (CIPRO) 500 MG tablet Take 1 tablet (500 mg total) by mouth 2 (two) times daily. (Patient not taking: Reported on 08/27/2017) 6 tablet 0  . enalapril (VASOTEC) 20 MG tablet Take 20 mg by mouth at bedtime.     . finasteride (PROSCAR) 5 MG tablet Take 5 mg by mouth at bedtime.     . fluticasone (FLONASE) 50 MCG/ACT nasal spray  Place 1 spray into both nostrils at bedtime.    Marland Kitchen glimepiride (AMARYL) 4 MG tablet Take 2 mg by mouth daily with breakfast.    . glucose blood (ONE TOUCH ULTRA TEST) test strip Use as directed. Check CBG's fasting daily. Dx: E11.22, N18.4    . hydrALAZINE (APRESOLINE) 100 MG tablet Take 100 mg by mouth 3 (three) times daily.    . Insulin Pen Needle (NOVOTWIST) 32G X 5 MM MISC Use 1 Units once daily. Dx: E11.9    . loratadine (CLARITIN) 10 MG tablet Take 10 mg by mouth daily as needed for allergies.     Marland Kitchen lovastatin (MEVACOR) 20 MG tablet Take 20 mg by mouth at bedtime.    . metoprolol (LOPRESSOR) 50 MG tablet Take 50 mg by mouth 2 (two) times daily.    . sodium bicarbonate 650 MG tablet Take 1,300 mg by mouth 2 (two) times daily.    Marland Kitchen trolamine salicylate (ASPERCREME) 10 % cream Apply 1 application topically as needed for muscle pain.    Marland Kitchen ULORIC 40 MG tablet Take 40 mg by mouth daily.      No current facility-administered medications for this visit.     OBJECTIVE: There were no vitals filed for this visit.   There is no height or weight on file to calculate BMI.    ECOG FS:0 - Asymptomatic  General: Well-developed, well-nourished, no acute distress. Eyes: Pink conjunctiva, anicteric sclera. Lungs: Clear to auscultation bilaterally. Heart: Regular rate and rhythm. No rubs, murmurs, or gallops. Abdomen: Soft, nontender, nondistended. No organomegaly noted, normoactive bowel sounds. Musculoskeletal: No edema, cyanosis, or clubbing. Neuro: Alert, answering all questions appropriately. Cranial nerves grossly intact. Skin: No rashes or petechiae noted. Psych: Normal affect.   LAB RESULTS:  Lab Results  Component Value Date   NA 141 12/13/2016   K 5.0 12/13/2016   CL 114 (H) 12/13/2016   CO2 20 (L) 10/14/2012   GLUCOSE 127 (H) 12/13/2016   BUN 66 (H) 12/13/2016   CREATININE 3.00 (H) 12/13/2016   CALCIUM 9.2 10/14/2012   PROT 7.3 10/14/2012   ALBUMIN 2.4 (L) 10/14/2012   AST 15  10/14/2012   ALT 15 10/14/2012   ALKPHOS 84 10/14/2012   BILITOT 0.3 10/14/2012   GFRNONAA 25 (L) 10/14/2012   GFRAA 29 (L) 10/14/2012    Lab Results  Component Value Date   WBC 5.9 11/26/2017   NEUTROABS 3.8 11/26/2017   HGB 8.9 (L) 11/26/2017   HCT 26.0 (L) 11/26/2017   MCV 90.5 11/26/2017   PLT 125 (L) 11/26/2017   Lab Results  Component Value Date   IRON 56 11/26/2017   TIBC 235 (L) 11/26/2017   IRONPCTSAT 24 11/26/2017   Lab Results  Component Value Date   FERRITIN 192 11/26/2017     STUDIES: No results found.  ASSESSMENT: Thrombocytopenia, anemia secondary to stage III chronic renal  failure.  PLAN:    1. 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  Proceed with 40,000 units subcutaneous Procrit today.  Previously the remainder of his laboratory work, including iron stores, was either negative or within normal limits.  Patient was receiving Procrit injections approximately every 6 months, but given his worsening renal function and declining hemoglobin he likely will benefit from more frequent injections.  Return to clinic in 3 months for laboratory work and Procrit only if his hemoglobin is below 10.0.  Patient will then return to clinic in 6 months for laboratory work and further evaluation.   2. Thrombocytopenia: Mild and relatively unchanged. Previously, the remainder of his laboratory work was either negative or within normal limits. No intervention is needed at this time. Patient does not require bone marrow biopsy. 3. Chronic renal failure: Patient's creatinine appears to be slightly worse.  He reports that dialysis has been discussed, but not necessary at this point.  Continue follow-up with nephrology as scheduled.   4. Hypertension: Patient's blood pressure is elevated today. Continue current medications and monitoring by PCP.  Okay to proceed with Procrit.  Patient expressed understanding and was in  agreement with this plan. He also understands that He can call clinic at any time with any questions, concerns, or complaints.   Lloyd Huger, MD   02/24/2018 7:41 AM

## 2018-02-25 ENCOUNTER — Inpatient Hospital Stay: Payer: Medicare Other | Admitting: Oncology

## 2018-02-25 ENCOUNTER — Inpatient Hospital Stay: Payer: Medicare Other

## 2018-03-03 NOTE — Progress Notes (Signed)
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  INTERVAL HISTORY: Patient returns to clinic today for routine evaluation, repeat laboratory work, and consideration of additional Procrit.  He continues to feel well and remains asymptomatic.  He denies any weakness or fatigue. He has no neurologic complaints. He denies any recent fevers or illnesses. He has a good appetite and denies weight loss. He denies any chest pain or shortness of breath. He denies any nausea, vomiting, constipation, or diarrhea. He denies any melena or hematochezia.  He has no urinary complaints.  Patient feels at his baseline offers no specific complaints today.  REVIEW OF SYSTEMS:   Review of Systems  Constitutional: Negative.  Negative for fever, malaise/fatigue and weight loss.  Respiratory: Negative.  Negative for cough and shortness of breath.   Cardiovascular: Negative.  Negative for chest pain and leg swelling.  Gastrointestinal: Negative.  Negative for abdominal pain, blood in stool and melena.  Genitourinary: Negative.  Negative for hematuria.  Musculoskeletal: Negative.  Negative for back pain.  Skin: Negative.  Negative for rash.  Neurological: Negative.  Negative for sensory change, focal weakness, weakness and headaches.  Psychiatric/Behavioral: Negative.  The patient is not nervous/anxious.     As per HPI. Otherwise, a complete review of systems is negative.  PAST MEDICAL HISTORY: Past Medical History:  Diagnosis Date  . Anemia   . Arthritis   . Chronic kidney disease    Stage 4 CKD  . Diabetes mellitus without complication ([place])   . GERD (gastroesophageal reflux disease)    h/o  . Gout   . Hyperlipidemia, mixed   . Hypertension     . Sleep apnea    h/o no cpap    PAST SURGICAL HISTORY: Past Surgical History:  Procedure Laterality Date  . APPENDECTOMY    . Bunion Removal Right   . COLONOSCOPY WITH PROPOFOL N/A 06/14/2015   Procedure: COLONOSCOPY WITH PROPOFOL;  Surgeon: Lollie Sails, MD;  [facility];  Service: Endoscopy;  Laterality: N/A;  . HERNIA REPAIR    . PROSTATE BIOPSY N/A 03/27/2017   Procedure: PROSTATE BIOPSY-URO NAV;  Surgeon: Royston Cowper, MD;  Location: [facility] ORS;  Service: Urology;  Laterality: N/A;  . RETINAL DETACHMENT SURGERY Left   . TRANSURETHRAL RESECTION OF PROSTATE      FAMILY HISTORY: Family History  Problem Relation Age of Onset  . Leukemia Father   . Leukemia Brother     ADVANCED DIRECTIVES (Y/N):  N  HEALTH MAINTENANCE: Social History   Tobacco Use  . Smoking status: Never Smoker  . Smokeless tobacco: Never Used  Substance Use Topics  . Alcohol use: Yes    Comment: occ  . Drug use: No     Colonoscopy:  PAP:  Bone density:  Lipid panel:  Allergies  Allergen Reactions  . Cardura [Doxazosin] Other (See Comments)    unknown  . Shellfish Allergy Swelling    Current Outpatient Medications  Medication Sig Dispense Refill  . acetaminophen (TYLENOL) 500 MG tablet Take 500 mg by mouth every 8 (eight) hours as needed for mild pain or headache.    Marland Kitchen amLODipine (NORVASC) 10 MG tablet Take 10 mg by mouth at bedtime.     Marland Kitchen aspirin EC 81 MG tablet Take 81 mg by mouth  daily.    . bicalutamide (CASODEX) 50 MG tablet Take 50 mg by mouth daily.     . calcitRIOL (ROCALTROL) 0.25 MCG capsule Take 0.25 mcg by mouth daily.     . Cholecalciferol 2000 UNITS CAPS Take 2,000 Units by mouth daily.    . enalapril (VASOTEC) 20 MG tablet Take 20 mg by mouth at bedtime.     . finasteride (PROSCAR) 5 MG tablet Take 5 mg by mouth at bedtime.     . fluticasone (FLONASE) 50 MCG/ACT nasal spray Place 1 spray into both nostrils at bedtime.    Marland Kitchen glimepiride (AMARYL) 4 MG  tablet Take 2 mg by mouth daily with breakfast.    . glucose blood (ONE TOUCH ULTRA TEST) test strip Use as directed. Check CBG's fasting daily. Dx: E11.22, N18.4    . hydrALAZINE (APRESOLINE) 100 MG tablet Take 100 mg by mouth 3 (three) times daily.    . Insulin Pen Needle (NOVOTWIST) 32G X 5 MM MISC Use 1 Units once daily. Dx: E11.9    . loratadine (CLARITIN) 10 MG tablet Take 10 mg by mouth daily as needed for allergies.     Marland Kitchen lovastatin (MEVACOR) 20 MG tablet Take 20 mg by mouth at bedtime.    . metoprolol (LOPRESSOR) 50 MG tablet Take 50 mg by mouth 2 (two) times daily.    . sodium bicarbonate 650 MG tablet Take 1,300 mg by mouth 2 (two) times daily.    Marland Kitchen trolamine salicylate (ASPERCREME) 10 % cream Apply 1 application topically as needed for muscle pain.    Marland Kitchen ULORIC 40 MG tablet Take 40 mg by mouth daily.     . ciprofloxacin (CIPRO) 500 MG tablet Take 1 tablet (500 mg total) by mouth 2 (two) times daily. (Patient not taking: Reported on 08/27/2017) 6 tablet 0   No current facility-administered medications for this visit.     OBJECTIVE: Vitals:   03/06/18 0935  BP: (!) 160/72  Pulse: 62  Resp: 18  Temp: (!) 96.4 F (35.8 C)     Body mass index is 38.27 kg/m.    ECOG FS:0 - Asymptomatic  General: Well-developed, well-nourished, no acute distress. Eyes: Pink conjunctiva, anicteric sclera. HEENT: Normocephalic, moist mucous membranes. Lungs: Clear to auscultation bilaterally. Heart: Regular rate and rhythm. No rubs, murmurs, or gallops. Abdomen: Soft, nontender, nondistended. No organomegaly noted, normoactive bowel sounds. Musculoskeletal: No edema, cyanosis, or clubbing. Neuro: Alert, answering all questions appropriately. Cranial nerves grossly intact. Skin: No rashes or petechiae noted. Psych: Normal affect.  LAB RESULTS:  Lab Results  Component Value Date   NA 141 12/13/2016   K 5.0 12/13/2016   CL 114 (H) 12/13/2016   CO2 20 (L) 10/14/2012   GLUCOSE 127 (H)  12/13/2016   BUN 66 (H) 12/13/2016   CREATININE 3.00 (H) 12/13/2016   CALCIUM 9.2 10/14/2012   PROT 7.3 10/14/2012   ALBUMIN 2.4 (L) 10/14/2012   AST 15 10/14/2012   ALT 15 10/14/2012   ALKPHOS 84 10/14/2012   BILITOT 0.3 10/14/2012   GFRNONAA 25 (L) 10/14/2012   GFRAA 29 (L) 10/14/2012    Lab Results  Component Value Date   WBC 6.3 03/06/2018   NEUTROABS 4.0 03/06/2018   HGB 9.1 (L) 03/06/2018   HCT 26.7 (L) 03/06/2018   MCV 92.5 03/06/2018   PLT 125 (L) 03/06/2018   Lab Results  Component Value Date   IRON 56 03/06/2018   TIBC 223 (L) 03/06/2018   IRONPCTSAT 25 03/06/2018  Lab Results  Component Value Date   FERRITIN 169 03/06/2018     STUDIES: No results found.  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  PLAN:    1. 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  Iron stores from March 06, 2018 continue to be within normal limits.  Previously the remainder of his laboratory work was either negative or within normal limits.  Proceed with 40,000 units subcutaneous Procrit today.  Given his worsening renal function, patient will likely require more frequent Procrit injections.  Return to clinic in 3 months for laboratory work and Procrit only if his hemoglobin is below 10.0.  Patient will then return to clinic in 6 months for further evaluation and continuation of treatment. 2. Thrombocytopenia: Patient's platelet count is 125 which is approximately his baseline.  No intervention is needed.  Patient does not require bone marrow biopsy.  Follow-up as above.   3. Chronic renal failure: Patient's creatinine appears to be getting worse and he has close follow-up with nephrology.  He reports that dialysis has been discussed, but not necessary at this point.   4. Hypertension: Patient's blood pressure remains chronically elevated.  Continue current medications and  monitoring by PCP.  Proceed with Procrit as above.  Patient expressed understanding and was in agreement with this plan. He also understands that He can call clinic at any time with any questions, concerns, or complaints.   Lloyd Huger, MD   03/10/2018 10:08 AM

## 2018-03-05 ENCOUNTER — Other Ambulatory Visit: Payer: Self-pay | Admitting: *Deleted

## 2018-03-05 DIAGNOSIS — D649 Anemia, unspecified: Secondary | ICD-10-CM

## 2018-03-06 ENCOUNTER — Inpatient Hospital Stay: Payer: Medicare Other | Attending: Oncology

## 2018-03-06 ENCOUNTER — Inpatient Hospital Stay (HOSPITAL_BASED_OUTPATIENT_CLINIC_OR_DEPARTMENT_OTHER): Payer: Medicare Other | Admitting: Oncology

## 2018-03-06 ENCOUNTER — Encounter: Payer: Self-pay | Admitting: Oncology

## 2018-03-06 ENCOUNTER — Inpatient Hospital Stay: Payer: Medicare Other

## 2018-03-06 ENCOUNTER — Other Ambulatory Visit: Payer: Self-pay

## 2018-03-06 VITALS — BP 160/72 | HR 62 | Temp 96.4°F | Resp 18 | Wt 251.7 lb

## 2018-03-06 DIAGNOSIS — N183 Chronic kidney disease, stage 3 unspecified: Secondary | ICD-10-CM

## 2018-03-06 DIAGNOSIS — D631 Anemia in chronic kidney disease: Secondary | ICD-10-CM | POA: Diagnosis not present

## 2018-03-06 DIAGNOSIS — D696 Thrombocytopenia, unspecified: Secondary | ICD-10-CM | POA: Insufficient documentation

## 2018-03-06 DIAGNOSIS — I129 Hypertensive chronic kidney disease with stage 1 through stage 4 chronic kidney disease, or unspecified chronic kidney disease: Secondary | ICD-10-CM | POA: Insufficient documentation

## 2018-03-06 DIAGNOSIS — D649 Anemia, unspecified: Secondary | ICD-10-CM

## 2018-03-06 LAB — CBC WITH DIFFERENTIAL/PLATELET
BASOS ABS: 0 10*3/uL (ref 0–0.1)
BASOS PCT: 1 %
Eosinophils Absolute: 0.5 10*3/uL (ref 0–0.7)
Eosinophils Relative: 8 %
HEMATOCRIT: 26.7 % — AB (ref 40.0–52.0)
Hemoglobin: 9.1 g/dL — ABNORMAL LOW (ref 13.0–18.0)
Lymphocytes Relative: 20 %
Lymphs Abs: 1.3 10*3/uL (ref 1.0–3.6)
MCH: 31.7 pg (ref 26.0–34.0)
MCHC: 34.3 g/dL (ref 32.0–36.0)
MCV: 92.5 fL (ref 80.0–100.0)
Monocytes Absolute: 0.5 10*3/uL (ref 0.2–1.0)
Monocytes Relative: 8 %
NEUTROS ABS: 4 10*3/uL (ref 1.4–6.5)
NEUTROS PCT: 63 %
Platelets: 125 10*3/uL — ABNORMAL LOW (ref 150–440)
RBC: 2.88 MIL/uL — AB (ref 4.40–5.90)
RDW: 13.9 % (ref 11.5–14.5)
WBC: 6.3 10*3/uL (ref 3.8–10.6)

## 2018-03-06 LAB — IRON AND TIBC
Iron: 56 ug/dL (ref 45–182)
Saturation Ratios: 25 % (ref 17.9–39.5)
TIBC: 223 ug/dL — ABNORMAL LOW (ref 250–450)
UIBC: 167 ug/dL

## 2018-03-06 LAB — FERRITIN: FERRITIN: 169 ng/mL (ref 24–336)

## 2018-03-06 MED ORDER — EPOETIN ALFA 40000 UNIT/ML IJ SOLN
40000.0000 [IU] | Freq: Once | INTRAMUSCULAR | Status: AC
  Administered 2018-03-06: 40000 [IU] via SUBCUTANEOUS
  Filled 2018-03-06: qty 1

## 2018-03-06 NOTE — Progress Notes (Signed)
BP is 160/72, MD approves to proceed with Procrit

## 2018-03-06 NOTE — Progress Notes (Signed)
Patient here for follow up. No concerns voiced.  °

## 2018-06-06 ENCOUNTER — Inpatient Hospital Stay: Payer: Medicare Other | Attending: Oncology

## 2018-06-06 ENCOUNTER — Inpatient Hospital Stay: Payer: Medicare Other

## 2018-06-06 ENCOUNTER — Other Ambulatory Visit: Payer: Self-pay

## 2018-06-06 VITALS — BP 137/66 | HR 60

## 2018-06-06 DIAGNOSIS — N183 Chronic kidney disease, stage 3 unspecified: Secondary | ICD-10-CM

## 2018-06-06 DIAGNOSIS — D631 Anemia in chronic kidney disease: Secondary | ICD-10-CM | POA: Diagnosis present

## 2018-06-06 LAB — CBC WITH DIFFERENTIAL/PLATELET
Abs Immature Granulocytes: 0.02 10*3/uL (ref 0.00–0.07)
BASOS ABS: 0.1 10*3/uL (ref 0.0–0.1)
BASOS PCT: 1 %
EOS ABS: 0.6 10*3/uL — AB (ref 0.0–0.5)
EOS PCT: 9 %
HCT: 24.9 % — ABNORMAL LOW (ref 39.0–52.0)
Hemoglobin: 8.1 g/dL — ABNORMAL LOW (ref 13.0–17.0)
IMMATURE GRANULOCYTES: 0 %
LYMPHS ABS: 1.6 10*3/uL (ref 0.7–4.0)
Lymphocytes Relative: 24 %
MCH: 30.1 pg (ref 26.0–34.0)
MCHC: 32.5 g/dL (ref 30.0–36.0)
MCV: 92.6 fL (ref 80.0–100.0)
Monocytes Absolute: 0.4 10*3/uL (ref 0.1–1.0)
Monocytes Relative: 7 %
NEUTROS PCT: 59 %
NRBC: 0 % (ref 0.0–0.2)
Neutro Abs: 3.8 10*3/uL (ref 1.7–7.7)
PLATELETS: 113 10*3/uL — AB (ref 150–400)
RBC: 2.69 MIL/uL — AB (ref 4.22–5.81)
RDW: 13.6 % (ref 11.5–15.5)
WBC: 6.4 10*3/uL (ref 4.0–10.5)

## 2018-06-06 LAB — FERRITIN: Ferritin: 316 ng/mL (ref 24–336)

## 2018-06-06 LAB — IRON AND TIBC
Iron: 56 ug/dL (ref 45–182)
SATURATION RATIOS: 24 % (ref 17.9–39.5)
TIBC: 237 ug/dL — ABNORMAL LOW (ref 250–450)
UIBC: 181 ug/dL

## 2018-06-06 MED ORDER — EPOETIN ALFA 40000 UNIT/ML IJ SOLN
40000.0000 [IU] | Freq: Once | INTRAMUSCULAR | Status: AC
  Administered 2018-06-06: 40000 [IU] via SUBCUTANEOUS

## 2018-06-17 ENCOUNTER — Other Ambulatory Visit: Payer: Self-pay

## 2018-06-17 ENCOUNTER — Emergency Department: Payer: Medicare Other

## 2018-06-17 ENCOUNTER — Emergency Department
Admission: EM | Admit: 2018-06-17 | Discharge: 2018-06-17 | Disposition: A | Discharge status: Home / Self Care | Payer: Medicare Other | Attending: Emergency Medicine | Admitting: Emergency Medicine

## 2018-06-17 DIAGNOSIS — R079 Chest pain, unspecified: Secondary | ICD-10-CM | POA: Diagnosis present

## 2018-06-17 DIAGNOSIS — Z7982 Long term (current) use of aspirin: Secondary | ICD-10-CM | POA: Diagnosis not present

## 2018-06-17 DIAGNOSIS — Z79899 Other long term (current) drug therapy: Secondary | ICD-10-CM | POA: Diagnosis not present

## 2018-06-17 DIAGNOSIS — N184 Chronic kidney disease, stage 4 (severe): Secondary | ICD-10-CM | POA: Diagnosis not present

## 2018-06-17 DIAGNOSIS — Z7984 Long term (current) use of oral hypoglycemic drugs: Secondary | ICD-10-CM | POA: Diagnosis not present

## 2018-06-17 DIAGNOSIS — E1122 Type 2 diabetes mellitus with diabetic chronic kidney disease: Secondary | ICD-10-CM | POA: Insufficient documentation

## 2018-06-17 DIAGNOSIS — R0789 Other chest pain: Secondary | ICD-10-CM | POA: Diagnosis not present

## 2018-06-17 DIAGNOSIS — I129 Hypertensive chronic kidney disease with stage 1 through stage 4 chronic kidney disease, or unspecified chronic kidney disease: Secondary | ICD-10-CM | POA: Insufficient documentation

## 2018-06-17 LAB — BASIC METABOLIC PANEL
Anion gap: 7 (ref 5–15)
BUN: 76 mg/dL — ABNORMAL HIGH (ref 8–23)
CO2: 21 mmol/L — ABNORMAL LOW (ref 22–32)
Calcium: 11.3 mg/dL — ABNORMAL HIGH (ref 8.9–10.3)
Chloride: 112 mmol/L — ABNORMAL HIGH (ref 98–111)
Creatinine, Ser: 5.56 mg/dL — ABNORMAL HIGH (ref 0.61–1.24)
GFR calc Af Amer: 10 mL/min — ABNORMAL LOW (ref >60)
GFR, EST NON AFRICAN AMERICAN: 9 mL/min — AB (ref >60)
Glucose, Bld: 186 mg/dL — ABNORMAL HIGH (ref 70–99)
POTASSIUM: 4.2 mmol/L (ref 3.5–5.1)
Sodium: 140 mmol/L (ref 135–145)

## 2018-06-17 LAB — CBC
HCT: 30.5 % — ABNORMAL LOW (ref 39.0–52.0)
HEMOGLOBIN: 9.6 g/dL — AB (ref 13.0–17.0)
MCH: 30 pg (ref 26.0–34.0)
MCHC: 31.5 g/dL (ref 30.0–36.0)
MCV: 95.3 fL (ref 80.0–100.0)
Platelets: 115 10*3/uL — ABNORMAL LOW (ref 150–400)
RBC: 3.2 MIL/uL — ABNORMAL LOW (ref 4.22–5.81)
RDW: 15.7 % — ABNORMAL HIGH (ref 11.5–15.5)
WBC: 5.8 10*3/uL (ref 4.0–10.5)
nRBC: 0 % (ref 0.0–0.2)

## 2018-06-17 LAB — TROPONIN I: Troponin I: <0.03 ng/mL (ref <0.03)

## 2018-06-17 MED ORDER — FAMOTIDINE 20 MG PO TABS
20.0000 mg | ORAL_TABLET | Freq: Once | ORAL | Status: AC
  Administered 2018-06-17: 20 mg via ORAL
  Filled 2018-06-17: qty 1

## 2018-06-17 NOTE — ED Notes (Signed)
Pt called out c/o chest pain in the center of his chest. Pt stated there was a sharp pain in center of chest that subsided quickly. This RN performed a repeat EKG and MD Aynor notified. This RN will continue to monitor.

## 2018-06-17 NOTE — ED Notes (Signed)
ED Provider at bedside. 

## 2018-06-17 NOTE — Discharge Instructions (Addendum)
Return to the ER immediately for new, worsening, or persistent chest pain especially pain that does not resolve within a few minutes, difficulty breathing, weakness or lightheadedness, or any other new or worsening symptoms that concern you.  Follow-up with your primary care and kidney doctors within the next 1 to 2 weeks.

## 2018-06-17 NOTE — ED Provider Notes (Addendum)
[facility] Emergency Department Provider Note ____________________________________________   First MD Initiated Contact with Patient 06/17/18 1512     (approximate)  I have reviewed the triage vital signs and the nursing notes.   HISTORY  Chief Complaint Chest Pain    HPI Elisandro Jarrett is a 77 y.o. male with PMH as noted below including chronic kidney disease but no significant cardiac history who presents with chest pain, acute onset this morning around 10 AM when the patient was sitting, nonexertional, described as a pounding, and now resolved.  He states he has been somewhat short of breath over the last month but denies any acute shortness of breath, nausea or vomiting, or lightheadedness.  He reports some tingling in the left arm.  Past Medical History:  Diagnosis Date  . Anemia   . Arthritis   . Chronic kidney disease    Stage 4 CKD  . Diabetes mellitus without complication ([place])   . GERD (gastroesophageal reflux disease)    h/o  . Gout   . Hyperlipidemia, mixed   . Hypertension   . Sleep apnea    h/o no cpap    Patient Active Problem List   Diagnosis Date Noted  . Thrombocytopenia ([place]) 05/07/2016  . Anemia of chronic kidney failure 05/07/2016    Past Surgical History:  Procedure Laterality Date  . APPENDECTOMY    . Bunion Removal Right   . COLONOSCOPY WITH PROPOFOL N/A 06/14/2015   Procedure: COLONOSCOPY WITH PROPOFOL;  Surgeon: Lollie Sails, MD;  Location: [facility] ENDOSCOPY;  Service: Endoscopy;  Laterality: N/A;  . HERNIA REPAIR    . PROSTATE BIOPSY N/A 03/27/2017   Procedure: PROSTATE BIOPSY-URO NAV;  Surgeon: Royston Cowper, MD;  Location: [facility] ORS;  Service: Urology;  Laterality: N/A;  . RETINAL DETACHMENT SURGERY Left   . TRANSURETHRAL RESECTION OF PROSTATE      Prior to Admission medications   Medication Sig Start Date End Date Taking? Authorizing Provider  acetaminophen (TYLENOL) 500 MG tablet Take 500 mg by mouth  every 8 (eight) hours as needed for mild pain or headache.    [provider]  amLODipine (NORVASC) 10 MG tablet Take 10 mg by mouth at bedtime.     [provider]  aspirin EC 81 MG tablet Take 81 mg by mouth daily.    [provider]  bicalutamide (CASODEX) 50 MG tablet Take 50 mg by mouth daily.     [provider]  calcitRIOL (ROCALTROL) 0.25 MCG capsule Take 0.25 mcg by mouth daily.  03/01/17   [provider]  Cholecalciferol 2000 UNITS CAPS Take 2,000 Units by mouth daily.    [provider]  ciprofloxacin (CIPRO) 500 MG tablet Take 1 tablet (500 mg total) by mouth 2 (two) times daily. Patient not taking: Reported on 08/27/2017 03/27/17   Royston Cowper, MD  enalapril (VASOTEC) 20 MG tablet Take 20 mg by mouth at bedtime.     [provider]  finasteride (PROSCAR) 5 MG tablet Take 5 mg by mouth at bedtime.     [provider]  fluticasone (FLONASE) 50 MCG/ACT nasal spray Place 1 spray into both nostrils at bedtime.    [provider]  glimepiride (AMARYL) 4 MG tablet Take 2 mg by mouth daily with breakfast.    [provider]  glucose blood (ONE TOUCH ULTRA TEST) test strip Use as directed. Check CBG's fasting daily. Dx: E11.22, N18.4 07/26/16   [provider]  hydrALAZINE (  APRESOLINE) 100 MG tablet Take 100 mg by mouth 3 (three) times daily.    [provider]  Insulin Pen Needle (NOVOTWIST) 32G X 5 MM MISC Use 1 Units once daily. Dx: E11.9 05/25/14   [provider]  loratadine (CLARITIN) 10 MG tablet Take 10 mg by mouth daily as needed for allergies.     [provider]  lovastatin (MEVACOR) 20 MG tablet Take 20 mg by mouth at bedtime.    [provider]  metoprolol (LOPRESSOR) 50 MG tablet Take 50 mg by mouth 2 (two) times daily.    [provider]  sodium bicarbonate 650 MG tablet Take 1,300 mg by mouth 2 (two) times daily.    [provider]  trolamine salicylate (ASPERCREME) 10 % cream Apply 1 application topically as needed for muscle pain.    [provider]  ULORIC 40 MG tablet Take 40 mg by mouth daily.  03/09/17   [provider]    Allergies Cardura [doxazosin] and Shellfish allergy  Family History  Problem Relation Age of Onset  . Leukemia Father   . Leukemia Brother     Social History Social History   Tobacco Use  . Smoking status: Never Smoker  . Smokeless tobacco: Never Used  Substance Use Topics  . Alcohol use: Yes    Comment: occ  . Drug use: No    Review of Systems  Constitutional: No fever. Eyes: No redness. ENT: No sore throat. Cardiovascular: Positive for chest pain. Respiratory: Positive for shortness of breath. Gastrointestinal: No vomiting.  Genitourinary: Negative for flank pain.  Musculoskeletal: Negative for back pain. Skin: Negative for rash. Neurological: Negative for headache.   ____________________________________________   PHYSICAL EXAM:  VITAL SIGNS: ED Triage Vitals  Enc Vitals Group     BP 06/17/18 1132 (!) 150/67     Pulse Rate 06/17/18 1132 70     Resp 06/17/18 1132 18     Temp 06/17/18 1132 97.9 F (36.6 C)     Temp src --      SpO2 06/17/18 1132 100 %     Weight 06/17/18 1130 228 lb (103.4 kg)     Height 06/17/18 1130 5\' 8"  (1.727 m)     Head Circumference --      Peak Flow --      Pain Score 06/17/18 1130 0     Pain Loc --      Pain Edu? --      Excl. in [place]? --     Constitutional: Alert and oriented.  Relatively well appearing and in no acute distress. Eyes: Conjunctivae are normal.  Head: Atraumatic. Nose: No congestion/rhinnorhea. Mouth/Throat: Mucous membranes are moist.   Neck: Normal range of motion.  Cardiovascular: Normal rate, regular rhythm. Grossly normal heart sounds.  Good peripheral circulation. Respiratory: Normal respiratory effort.  No retractions. Lungs CTAB. Gastrointestinal: No distention.  Musculoskeletal:  No lower extremity edema.  Extremities warm and well perfused.  Neurologic:  Normal speech and language.  Motor and sensory intact in all extremities.  No gross focal neurologic deficits are appreciated.  Skin:  Skin is warm and dry. No rash noted. Psychiatric: Mood and affect are normal. Speech and behavior are normal.  ____________________________________________   LABS (all labs ordered are listed, but only abnormal results are displayed)  Labs Reviewed  BASIC METABOLIC PANEL - Abnormal; Notable for the following components:      Result Value   Chloride 112 (*)    CO2 21 (*)  Glucose, Bld 186 (*)    BUN 76 (*)    Creatinine, Ser 5.56 (*)    Calcium 11.3 (*)    GFR calc non Af Amer 9 (*)    GFR calc Af Amer 10 (*)    All other components within normal limits  CBC - Abnormal; Notable for the following components:   RBC 3.20 (*)    Hemoglobin 9.6 (*)    HCT 30.5 (*)    RDW 15.7 (*)    Platelets 115 (*)    All other components within normal limits  TROPONIN I  TROPONIN I   ____________________________________________  EKG  ED ECG REPORT I, Arta Silence, the attending physician, personally viewed and interpreted this ECG.  Date: 06/17/2018 EKG Time: 1125 Rate: 68 Rhythm: normal sinus rhythm QRS Axis: normal Intervals: normal ST/T Wave abnormalities: normal Narrative Interpretation: no evidence of acute ischemia; no significant change compared to EKG of 03/20/2017  ED ECG REPORT I, Arta Silence, the attending physician, personally viewed and interpreted this ECG.  Date: 06/17/2018 EKG Time: 1603 Rate: 72 Rhythm: normal sinus rhythm QRS Axis: normal Intervals: normal ST/T Wave abnormalities: normal Narrative Interpretation: no evidence of acute ischemia; no dynamic changes when compared to EKG of 1125 today  ____________________________________________  RADIOLOGY  CXR: No focal infiltrate or other acute  abnormality  ____________________________________________   PROCEDURES  Procedure(s) performed: No  Procedures  Critical Care performed: No ____________________________________________   INITIAL IMPRESSION / ASSESSMENT AND PLAN / ED COURSE  Pertinent labs & imaging results that were available during my care of the patient were reviewed by me and considered in my medical decision making (see chart for details).  77 year old male with history of kidney disease and anemia as well as thrombocytopenia presents with atypical and nonexertional chest pain since this morning, which has now resolved.  He reports some subacute shortness of breath due to a sinus infection for which he has been treated.  He denies other acute symptoms.  I reviewed the past medical records in Epic; the patient has had no recent prior ED visits or admissions.  I do not see any record of her recent echocardiogram or stress test.  On exam the patient's vital signs are normal.  He is relatively well appearing.  The exam is otherwise unremarkable.  There is no peripheral edema and the lungs are clear.  His EKG is normal and is unchanged from prior.  Overall the presentation is not consistent with ACS.  I suspect most likely musculoskeletal pain, GERD, neuropathic pain, or other benign etiology.  Given that the symptoms resolved spontaneously and especially with the normal vital signs there is no clinical evidence for aortic dissection or other vascular cause.  Similarly there is no clinical evidence for DVT or PE.    On labs obtained from triage the patient's renal function has worsened from the most recent labs I have access to a year ago, with GFR going from approximately 20 to 10.  However this is likely a chronic process and there is no evidence of acute renal insufficiency.  First troponin is negative.  Will obtain a second troponin.  I anticipate discharge home with close PMD follow-up if it is  negative.  ----------------------------------------- 4:28 PM on 06/17/2018 -----------------------------------------  On further history, the patient states that he saw his kidney doctor within the last week and had labs drawn in order to monitor his kidney function.  Therefore the somewhat worsened creatinine is likely being monitored and does not  appear to be of clinical significance today.  The patient had another episode of the pain while in the ED although it lasted just a few seconds and has now resolved again.  He points to the epigastric region, and I suspect that it may actually be GERD.  Repeat EKG obtained during this time shows no change, and the second troponin is negative.  Overall at this time there is no evidence of ACS or other concerning cause of the patient's atypical pain.  He very strongly would like to go home and based on the current work-up and presentation, he is safe for discharge at this time.  I instructed him to follow-up closely with his primary care and kidney specialists.  I gave him thorough return precautions, and he expressed understanding.  ____________________________________________   FINAL CLINICAL IMPRESSION(S) / ED DIAGNOSES  Final diagnoses:  Atypical chest pain      NEW MEDICATIONS STARTED DURING THIS VISIT:  New Prescriptions   No medications on file     Note:  This document was prepared using Dragon voice recognition software and may include unintentional dictation errors.      Arta Silence, MD 06/17/18 [phone]

## 2018-06-17 NOTE — ED Triage Notes (Signed)
Pt c/o chest pain since this morning with numbness in left arm.

## 2018-08-30 ENCOUNTER — Other Ambulatory Visit: Payer: Self-pay

## 2018-08-30 DIAGNOSIS — D631 Anemia in chronic kidney disease: Secondary | ICD-10-CM

## 2018-08-30 DIAGNOSIS — N183 Chronic kidney disease, stage 3 (moderate): Principal | ICD-10-CM

## 2018-08-30 NOTE — Progress Notes (Signed)
c 

## 2018-08-31 NOTE — Progress Notes (Signed)
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  INTERVAL HISTORY: Patient returns to clinic today for repeat laboratory work, further evaluation, and consideration of Retacrit.  He continues to feel well and remains asymptomatic.  He does not complain of any weakness or fatigue today.  He has no neurologic complaints. He denies any recent fevers or illnesses. He has a good appetite and denies weight loss. He denies any chest pain or shortness of breath. He denies any nausea, vomiting, constipation, or diarrhea. He denies any melena or hematochezia.  He has no urinary complaints.  Patient feels at his baseline offers no specific complaints today.  REVIEW OF SYSTEMS:   Review of Systems  Constitutional: Negative.  Negative for fever, malaise/fatigue and weight loss.  Respiratory: Negative.  Negative for cough and shortness of breath.   Cardiovascular: Negative.  Negative for chest pain and leg swelling.  Gastrointestinal: Negative.  Negative for abdominal pain, blood in stool and melena.  Genitourinary: Negative.  Negative for hematuria.  Musculoskeletal: Negative.  Negative for back pain.  Skin: Negative.  Negative for rash.  Neurological: Negative.  Negative for sensory change, focal weakness, weakness and headaches.  Psychiatric/Behavioral: Negative.  The patient is not nervous/anxious.     As per HPI. Otherwise, a complete review of systems is negative.  PAST MEDICAL HISTORY: Past Medical History:  Diagnosis Date  . Anemia   . Arthritis   . Chronic kidney disease    Stage 4 CKD  . Diabetes mellitus without complication ([place])   . GERD (gastroesophageal reflux disease)    h/o  . Gout   . Hyperlipidemia, mixed   .  Hypertension   . Sleep apnea    h/o no cpap    PAST SURGICAL HISTORY: Past Surgical History:  Procedure Laterality Date  . APPENDECTOMY    . Bunion Removal Right   . COLONOSCOPY WITH PROPOFOL N/A 06/14/2015   Procedure: COLONOSCOPY WITH PROPOFOL;  Surgeon: Lollie Sails, MD;  Location: [facility] ENDOSCOPY;  Service: Endoscopy;  Laterality: N/A;  . HERNIA REPAIR    . PROSTATE BIOPSY N/A 03/27/2017   Procedure: PROSTATE BIOPSY-URO NAV;  Surgeon: Royston Cowper, MD;  Location: [facility] ORS;  Service: Urology;  Laterality: N/A;  . RETINAL DETACHMENT SURGERY Left   . TRANSURETHRAL RESECTION OF PROSTATE      FAMILY HISTORY: Family History  Problem Relation Age of Onset  . Leukemia Father   . Leukemia Brother     ADVANCED DIRECTIVES (Y/N):  N  HEALTH MAINTENANCE: Social History   Tobacco Use  . Smoking status: Never Smoker  . Smokeless tobacco: Never Used  Substance Use Topics  . Alcohol use: Yes    Comment: occ  . Drug use: No     Colonoscopy:  PAP:  Bone density:  Lipid panel:  Allergies  Allergen Reactions  . Cardura [Doxazosin] Other (See Comments)    unknown  . Shellfish Allergy Swelling    Current Outpatient Medications  Medication Sig Dispense Refill  . acetaminophen (TYLENOL) 500 MG tablet Take 500 mg by mouth every 8 (eight) hours as needed for mild pain or headache.    Marland Kitchen amLODipine (NORVASC) 10 MG tablet Take 10 mg by mouth at bedtime.     Marland Kitchen aspirin EC 81 MG tablet Take 81  mg by mouth daily.    . bicalutamide (CASODEX) 50 MG tablet Take 50 mg by mouth daily.     . calcitRIOL (ROCALTROL) 0.25 MCG capsule Take 0.25 mcg by mouth daily.     . Cholecalciferol 2000 UNITS CAPS Take 2,000 Units by mouth daily.    . ciprofloxacin (CIPRO) 500 MG tablet Take 1 tablet (500 mg total) by mouth 2 (two) times daily. 6 tablet 0  . enalapril (VASOTEC) 20 MG tablet Take 20 mg by mouth at bedtime.     . finasteride (PROSCAR) 5 MG tablet Take 5 mg by mouth at bedtime.     .  fluticasone (FLONASE) 50 MCG/ACT nasal spray Place 1 spray into both nostrils at bedtime.    Marland Kitchen glimepiride (AMARYL) 4 MG tablet Take 2 mg by mouth daily with breakfast.    . glucose blood (ONE TOUCH ULTRA TEST) test strip Use as directed. Check CBG's fasting daily. Dx: E11.22, N18.4    . hydrALAZINE (APRESOLINE) 100 MG tablet Take 100 mg by mouth 3 (three) times daily.    . Insulin Pen Needle (NOVOTWIST) 32G X 5 MM MISC Use 1 Units once daily. Dx: E11.9    . loratadine (CLARITIN) 10 MG tablet Take 10 mg by mouth daily as needed for allergies.     Marland Kitchen lovastatin (MEVACOR) 20 MG tablet Take 20 mg by mouth at bedtime.    . metoprolol (LOPRESSOR) 50 MG tablet Take 50 mg by mouth 2 (two) times daily.    . sodium bicarbonate 650 MG tablet Take 1,300 mg by mouth 2 (two) times daily.    Marland Kitchen trolamine salicylate (ASPERCREME) 10 % cream Apply 1 application topically as needed for muscle pain.    Marland Kitchen ULORIC 40 MG tablet Take 40 mg by mouth daily.      No current facility-administered medications for this visit.     OBJECTIVE: Vitals:   09/05/18 1028  BP: (!) 153/74  Pulse: 63  Resp: 18  Temp: 97.6 F (36.4 C)     Body mass index is 34.96 kg/m.    ECOG FS:0 - Asymptomatic  General: Well-developed, well-nourished, no acute distress. Eyes: Pink conjunctiva, anicteric sclera. HEENT: Normocephalic, moist mucous membranes. Lungs: Clear to auscultation bilaterally. Heart: Regular rate and rhythm. No rubs, murmurs, or gallops. Abdomen: Soft, nontender, nondistended. No organomegaly noted, normoactive bowel sounds. Musculoskeletal: No edema, cyanosis, or clubbing. Neuro: Alert, answering all questions appropriately. Cranial nerves grossly intact. Skin: No rashes or petechiae noted. Psych: Normal affect.  LAB RESULTS:  Lab Results  Component Value Date   NA 140 06/17/2018   K 4.2 06/17/2018   CL 112 (H) 06/17/2018   CO2 21 (L) 06/17/2018   GLUCOSE 186 (H) 06/17/2018   BUN 76 (H) 06/17/2018    CREATININE 5.56 (H) 06/17/2018   CALCIUM 11.3 (H) 06/17/2018   PROT 7.3 10/14/2012   ALBUMIN 2.4 (L) 10/14/2012   AST 15 10/14/2012   ALT 15 10/14/2012   ALKPHOS 84 10/14/2012   BILITOT 0.3 10/14/2012   GFRNONAA 9 (L) 06/17/2018   GFRAA 10 (L) 06/17/2018    Lab Results  Component Value Date   WBC 5.1 09/05/2018   NEUTROABS 3.2 09/05/2018   HGB 7.6 (L) 09/05/2018   HCT 24.2 (L) 09/05/2018   MCV 94.9 09/05/2018   PLT 112 (L) 09/05/2018   Lab Results  Component Value Date   IRON 32 (L) 09/05/2018   TIBC 205 (L) 09/05/2018   IRONPCTSAT 16 (L) 09/05/2018   Lab  Results  Component Value Date   FERRITIN 194 09/05/2018     STUDIES: No results found.  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  PLAN:    1. 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  His iron stores are trending down as well, but he does not yet need IV Feraheme.  Previously the remainder of his laboratory work was either negative or within normal limits.  Proceed with 40,000 units subcutaneous Retacrit today.  Patient appears to require more frequent reticulocyte injections, therefore will return to clinic every 6 weeks for laboratory work and treatment if his hemoglobin remains below 10.0.  Patient will then return to clinic in 6 months with repeat laboratory work and further evaluation.   2. Thrombocytopenia: Patient's platelet count is 112.  No intervention is needed.  Patient does not require bone marrow biopsy.  Follow-up as above.   3. Chronic renal failure: Patient's creatinine is significantly worse.  Continue close follow-up with nephrology.  He has not initiated dialysis yet. 4. Hypertension: Patient's blood pressure remains chronically elevated.  Continue current medications and monitoring by PCP.  Proceed with Retacrit as above.  Patient expressed understanding and was in agreement with this plan. He  also understands that He can call clinic at any time with any questions, concerns, or complaints.   Lloyd Huger, MD   09/06/2018 6:53 AM

## 2018-09-04 ENCOUNTER — Encounter: Payer: Self-pay | Admitting: Oncology

## 2018-09-04 ENCOUNTER — Other Ambulatory Visit: Payer: Self-pay | Admitting: *Deleted

## 2018-09-04 ENCOUNTER — Other Ambulatory Visit: Payer: Self-pay | Admitting: Oncology

## 2018-09-05 ENCOUNTER — Inpatient Hospital Stay (HOSPITAL_BASED_OUTPATIENT_CLINIC_OR_DEPARTMENT_OTHER): Payer: Medicare Other | Admitting: Oncology

## 2018-09-05 ENCOUNTER — Encounter: Payer: Self-pay | Admitting: Oncology

## 2018-09-05 ENCOUNTER — Other Ambulatory Visit: Payer: Self-pay

## 2018-09-05 ENCOUNTER — Inpatient Hospital Stay: Payer: Medicare Other | Attending: Oncology

## 2018-09-05 ENCOUNTER — Inpatient Hospital Stay: Payer: Medicare Other

## 2018-09-05 VITALS — BP 153/74 | HR 63 | Temp 97.6°F | Resp 18 | Wt 229.9 lb

## 2018-09-05 DIAGNOSIS — N183 Chronic kidney disease, stage 3 unspecified: Secondary | ICD-10-CM

## 2018-09-05 DIAGNOSIS — D631 Anemia in chronic kidney disease: Secondary | ICD-10-CM

## 2018-09-05 DIAGNOSIS — D696 Thrombocytopenia, unspecified: Secondary | ICD-10-CM | POA: Insufficient documentation

## 2018-09-05 DIAGNOSIS — I129 Hypertensive chronic kidney disease with stage 1 through stage 4 chronic kidney disease, or unspecified chronic kidney disease: Secondary | ICD-10-CM | POA: Diagnosis not present

## 2018-09-05 LAB — CBC WITH DIFFERENTIAL/PLATELET
Abs Immature Granulocytes: 0.01 10*3/uL (ref 0.00–0.07)
BASOS PCT: 1 %
Basophils Absolute: 0 10*3/uL (ref 0.0–0.1)
EOS ABS: 0.4 10*3/uL (ref 0.0–0.5)
EOS PCT: 7 %
HCT: 24.2 % — ABNORMAL LOW (ref 39.0–52.0)
Hemoglobin: 7.6 g/dL — ABNORMAL LOW (ref 13.0–17.0)
Immature Granulocytes: 0 %
LYMPHS PCT: 20 %
Lymphs Abs: 1 10*3/uL (ref 0.7–4.0)
MCH: 29.8 pg (ref 26.0–34.0)
MCHC: 31.4 g/dL (ref 30.0–36.0)
MCV: 94.9 fL (ref 80.0–100.0)
MONO ABS: 0.5 10*3/uL (ref 0.1–1.0)
Monocytes Relative: 10 %
Neutro Abs: 3.2 10*3/uL (ref 1.7–7.7)
Neutrophils Relative %: 62 %
PLATELETS: 112 10*3/uL — AB (ref 150–400)
RBC: 2.55 MIL/uL — AB (ref 4.22–5.81)
RDW: 14.5 % (ref 11.5–15.5)
WBC: 5.1 10*3/uL (ref 4.0–10.5)
nRBC: 0 % (ref 0.0–0.2)

## 2018-09-05 LAB — IRON AND TIBC
IRON: 32 ug/dL — AB (ref 45–182)
Saturation Ratios: 16 % — ABNORMAL LOW (ref 17.9–39.5)
TIBC: 205 ug/dL — AB (ref 250–450)
UIBC: 173 ug/dL

## 2018-09-05 LAB — FERRITIN: FERRITIN: 194 ng/mL (ref 24–336)

## 2018-09-05 MED ORDER — EPOETIN ALFA-EPBX 40000 UNIT/ML IJ SOLN
40000.0000 [IU] | Freq: Once | INTRAMUSCULAR | Status: AC
  Administered 2018-09-05: 40000 [IU] via SUBCUTANEOUS
  Filled 2018-09-05: qty 1

## 2018-09-05 NOTE — Progress Notes (Signed)
Patient denies any concerns today.  

## 2018-10-17 ENCOUNTER — Inpatient Hospital Stay: Payer: Medicare Other | Attending: Oncology

## 2018-10-17 ENCOUNTER — Inpatient Hospital Stay: Payer: Medicare Other

## 2018-10-17 VITALS — BP 156/75 | HR 60

## 2018-10-17 DIAGNOSIS — D631 Anemia in chronic kidney disease: Secondary | ICD-10-CM | POA: Diagnosis present

## 2018-10-17 DIAGNOSIS — N183 Chronic kidney disease, stage 3 (moderate): Secondary | ICD-10-CM | POA: Insufficient documentation

## 2018-10-17 LAB — CBC WITH DIFFERENTIAL/PLATELET
Abs Immature Granulocytes: 0.01 10*3/uL (ref 0.00–0.07)
Basophils Absolute: 0 10*3/uL (ref 0.0–0.1)
Basophils Relative: 1 %
EOS ABS: 0.4 10*3/uL (ref 0.0–0.5)
EOS PCT: 6 %
HEMATOCRIT: 26.3 % — AB (ref 39.0–52.0)
HEMOGLOBIN: 8.2 g/dL — AB (ref 13.0–17.0)
Immature Granulocytes: 0 %
LYMPHS ABS: 1.4 10*3/uL (ref 0.7–4.0)
Lymphocytes Relative: 23 %
MCH: 29.3 pg (ref 26.0–34.0)
MCHC: 31.2 g/dL (ref 30.0–36.0)
MCV: 93.9 fL (ref 80.0–100.0)
MONO ABS: 0.5 10*3/uL (ref 0.1–1.0)
Monocytes Relative: 9 %
NRBC: 0 % (ref 0.0–0.2)
Neutro Abs: 3.6 10*3/uL (ref 1.7–7.7)
Neutrophils Relative %: 61 %
Platelets: 113 10*3/uL — ABNORMAL LOW (ref 150–400)
RBC: 2.8 MIL/uL — ABNORMAL LOW (ref 4.22–5.81)
RDW: 15.3 % (ref 11.5–15.5)
WBC: 5.9 10*3/uL (ref 4.0–10.5)

## 2018-10-17 LAB — IRON AND TIBC
IRON: 40 ug/dL — AB (ref 45–182)
Saturation Ratios: 19 % (ref 17.9–39.5)
TIBC: 206 ug/dL — AB (ref 250–450)
UIBC: 166 ug/dL

## 2018-10-17 LAB — FERRITIN: Ferritin: 158 ng/mL (ref 24–336)

## 2018-10-17 MED ORDER — EPOETIN ALFA-EPBX 40000 UNIT/ML IJ SOLN
40000.0000 [IU] | Freq: Once | INTRAMUSCULAR | Status: AC
  Administered 2018-10-17: 40000 [IU] via SUBCUTANEOUS
  Filled 2018-10-17: qty 1

## 2018-11-27 ENCOUNTER — Other Ambulatory Visit: Payer: Self-pay

## 2018-11-28 ENCOUNTER — Inpatient Hospital Stay: Payer: Medicare Other | Attending: Oncology

## 2018-11-28 ENCOUNTER — Other Ambulatory Visit: Payer: Self-pay

## 2018-11-28 ENCOUNTER — Inpatient Hospital Stay: Payer: Medicare Other

## 2018-11-28 VITALS — BP 160/68 | HR 62

## 2018-11-28 DIAGNOSIS — N183 Chronic kidney disease, stage 3 unspecified: Secondary | ICD-10-CM

## 2018-11-28 DIAGNOSIS — D631 Anemia in chronic kidney disease: Secondary | ICD-10-CM | POA: Insufficient documentation

## 2018-11-28 LAB — CBC WITH DIFFERENTIAL/PLATELET
Abs Immature Granulocytes: 0.01 10*3/uL (ref 0.00–0.07)
Basophils Absolute: 0 10*3/uL (ref 0.0–0.1)
Basophils Relative: 1 %
Eosinophils Absolute: 0.3 10*3/uL (ref 0.0–0.5)
Eosinophils Relative: 4 %
HCT: 28.2 % — ABNORMAL LOW (ref 39.0–52.0)
Hemoglobin: 8.7 g/dL — ABNORMAL LOW (ref 13.0–17.0)
Immature Granulocytes: 0 %
Lymphocytes Relative: 22 %
Lymphs Abs: 1.3 10*3/uL (ref 0.7–4.0)
MCH: 29.3 pg (ref 26.0–34.0)
MCHC: 30.9 g/dL (ref 30.0–36.0)
MCV: 94.9 fL (ref 80.0–100.0)
Monocytes Absolute: 0.5 10*3/uL (ref 0.1–1.0)
Monocytes Relative: 8 %
Neutro Abs: 3.8 10*3/uL (ref 1.7–7.7)
Neutrophils Relative %: 65 %
Platelets: 98 10*3/uL — ABNORMAL LOW (ref 150–400)
RBC: 2.97 MIL/uL — ABNORMAL LOW (ref 4.22–5.81)
RDW: 15.3 % (ref 11.5–15.5)
WBC: 5.8 10*3/uL (ref 4.0–10.5)
nRBC: 0 % (ref 0.0–0.2)

## 2018-11-28 LAB — FERRITIN: Ferritin: 183 ng/mL (ref 24–336)

## 2018-11-28 LAB — IRON AND TIBC
Iron: 53 ug/dL (ref 45–182)
Saturation Ratios: 25 % (ref 17.9–39.5)
TIBC: 215 ug/dL — ABNORMAL LOW (ref 250–450)
UIBC: 162 ug/dL

## 2018-11-28 MED ORDER — EPOETIN ALFA-EPBX 40000 UNIT/ML IJ SOLN
40000.0000 [IU] | Freq: Once | INTRAMUSCULAR | Status: AC
  Administered 2018-11-28: 40000 [IU] via SUBCUTANEOUS
  Filled 2018-11-28: qty 1

## 2019-01-09 ENCOUNTER — Inpatient Hospital Stay: Payer: Medicare Other

## 2019-01-09 ENCOUNTER — Inpatient Hospital Stay: Payer: Medicare Other | Attending: Oncology

## 2019-03-06 ENCOUNTER — Inpatient Hospital Stay: Payer: Medicare Other

## 2019-03-06 ENCOUNTER — Inpatient Hospital Stay: Payer: Medicare Other | Admitting: Oncology

## 2019-03-23 NOTE — Progress Notes (Signed)
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  INTERVAL HISTORY: Patient returns to clinic today for repeat laboratory work, further evaluation, and consideration of Retacrit.  He continues to feel well and remains asymptomatic.  He denies any weakness or fatigue. He has no neurologic complaints. He denies any recent fevers or illnesses. He has a good appetite and denies weight loss.  He denies any chest pain, shortness of breath, cough, or hemoptysis.  He denies any nausea, vomiting, constipation, or diarrhea. He denies any melena or hematochezia.  He has no urinary complaints.  Patient feels at his baseline offers no specific complaints today.  REVIEW OF SYSTEMS:   Review of Systems  Constitutional: Negative.  Negative for fever, malaise/fatigue and weight loss.  Respiratory: Negative.  Negative for cough and shortness of breath.   Cardiovascular: Negative.  Negative for chest pain and leg swelling.  Gastrointestinal: Negative.  Negative for abdominal pain, blood in stool and melena.  Genitourinary: Negative.  Negative for hematuria.  Musculoskeletal: Negative.  Negative for back pain.  Skin: Negative.  Negative for rash.  Neurological: Negative.  Negative for sensory change, focal weakness, weakness and headaches.  Psychiatric/Behavioral: Negative.  The patient is not nervous/anxious.     As per HPI. Otherwise, a complete review of systems is negative.  PAST MEDICAL HISTORY: Past Medical History:  Diagnosis Date  . Anemia   . Arthritis   . Chronic kidney disease    Stage 4 CKD  . Diabetes mellitus without complication ([place])   . GERD (gastroesophageal reflux disease)    h/o  . Gout   . Hyperlipidemia, mixed   . Hypertension    . Sleep apnea    h/o no cpap    PAST SURGICAL HISTORY: Past Surgical History:  Procedure Laterality Date  . APPENDECTOMY    . Bunion Removal Right   . COLONOSCOPY WITH PROPOFOL N/A 06/14/2015   Procedure: COLONOSCOPY WITH PROPOFOL;  Surgeon: Lollie Sails, MD;  Location: [facility] ENDOSCOPY;  Service: Endoscopy;  Laterality: N/A;  . HERNIA REPAIR    . PROSTATE BIOPSY N/A 03/27/2017   Procedure: PROSTATE BIOPSY-URO NAV;  Surgeon: Royston Cowper, MD;  Location: [facility] ORS;  Service: Urology;  Laterality: N/A;  . RETINAL DETACHMENT SURGERY Left   . TRANSURETHRAL RESECTION OF PROSTATE      FAMILY HISTORY: Family History  Problem Relation Age of Onset  . Leukemia Father   . Leukemia Brother     ADVANCED DIRECTIVES (Y/N):  N  HEALTH MAINTENANCE: Social History   Tobacco Use  . Smoking status: Never Smoker  . Smokeless tobacco: Never Used  Substance Use Topics  . Alcohol use: Yes    Comment: occ  . Drug use: No     Colonoscopy:  PAP:  Bone density:  Lipid panel:  Allergies  Allergen Reactions  . Cardura [Doxazosin] Other (See Comments)    unknown  . Shellfish Allergy Swelling    Current Outpatient Medications  Medication Sig Dispense Refill  . acetaminophen (TYLENOL) 500 MG tablet Take 500 mg by mouth every 8 (eight) hours as needed for mild pain or headache.    Marland Kitchen amLODipine (NORVASC) 10 MG tablet Take 10 mg by mouth at bedtime.     Marland Kitchen aspirin EC 81 MG tablet Take 81 mg by mouth  daily.    . bicalutamide (CASODEX) 50 MG tablet Take 50 mg by mouth daily.     . calcitRIOL (ROCALTROL) 0.25 MCG capsule Take 0.25 mcg by mouth daily.     . Cholecalciferol 2000 UNITS CAPS Take 2,000 Units by mouth daily.    . ciprofloxacin (CIPRO) 500 MG tablet Take 1 tablet (500 mg total) by mouth 2 (two) times daily. 6 tablet 0  . enalapril (VASOTEC) 20 MG tablet Take 20 mg by mouth at bedtime.     . finasteride (PROSCAR) 5 MG tablet Take 5 mg by mouth at bedtime.     . fluticasone  (FLONASE) 50 MCG/ACT nasal spray Place 1 spray into both nostrils at bedtime.    Marland Kitchen glimepiride (AMARYL) 4 MG tablet Take 2 mg by mouth daily with breakfast.    . glucose blood (ONE TOUCH ULTRA TEST) test strip Use as directed. Check CBG's fasting daily. Dx: E11.22, N18.4    . hydrALAZINE (APRESOLINE) 100 MG tablet Take 100 mg by mouth 3 (three) times daily.    . Insulin Pen Needle (NOVOTWIST) 32G X 5 MM MISC Use 1 Units once daily. Dx: E11.9    . loratadine (CLARITIN) 10 MG tablet Take 10 mg by mouth daily as needed for allergies.     Marland Kitchen lovastatin (MEVACOR) 20 MG tablet Take 20 mg by mouth at bedtime.    . metoprolol (LOPRESSOR) 50 MG tablet Take 50 mg by mouth 2 (two) times daily.    . sodium bicarbonate 650 MG tablet Take 1,300 mg by mouth 2 (two) times daily.    Marland Kitchen trolamine salicylate (ASPERCREME) 10 % cream Apply 1 application topically as needed for muscle pain.    Marland Kitchen ULORIC 40 MG tablet Take 40 mg by mouth daily.      No current facility-administered medications for this visit.     OBJECTIVE: Vitals:   03/28/19 0852  BP: 138/66  Pulse: 64  Temp: (!) 97 F (36.1 C)     Body mass index is 30.52 kg/m.    ECOG FS:0 - Asymptomatic  General: Well-developed, well-nourished, no acute distress. Eyes: Pink conjunctiva, anicteric sclera. HEENT: Normocephalic, moist mucous membranes. Lungs: Clear to auscultation bilaterally. Heart: Regular rate and rhythm. No rubs, murmurs, or gallops. Abdomen: Soft, nontender, nondistended. No organomegaly noted, normoactive bowel sounds. Musculoskeletal: No edema, cyanosis, or clubbing. Neuro: Alert, answering all questions appropriately. Cranial nerves grossly intact. Skin: No rashes or petechiae noted. Psych: Normal affect.  LAB RESULTS:  Lab Results  Component Value Date   NA 140 06/17/2018   K 4.2 06/17/2018   CL 112 (H) 06/17/2018   CO2 21 (L) 06/17/2018   GLUCOSE 186 (H) 06/17/2018   BUN 76 (H) 06/17/2018   CREATININE 5.56 (H)  06/17/2018   CALCIUM 11.3 (H) 06/17/2018   PROT 7.3 10/14/2012   ALBUMIN 2.4 (L) 10/14/2012   AST 15 10/14/2012   ALT 15 10/14/2012   ALKPHOS 84 10/14/2012   BILITOT 0.3 10/14/2012   GFRNONAA 9 (L) 06/17/2018   GFRAA 10 (L) 06/17/2018    Lab Results  Component Value Date   WBC 6.0 03/28/2019   NEUTROABS 3.5 03/28/2019   HGB 7.6 (L) 03/28/2019   HCT 23.8 (L) 03/28/2019   MCV 95.2 03/28/2019   PLT 122 (L) 03/28/2019   Lab Results  Component Value Date   IRON 45 03/28/2019   TIBC 226 (L) 03/28/2019   IRONPCTSAT 20 03/28/2019   Lab Results  Component Value Date   FERRITIN  238 03/28/2019     STUDIES: No results found.  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  PLAN:    1. Anemia secondary to chronic renal failure: Patient's hemoglobin remained significantly decreased at 7.6, but his iron stores continue to be within normal limits.  He does not require IV Feraheme, but will benefit from 40,000 units subcutaneous Retacrit given his chronic renal insufficiency that appears to be getting worse.  Patient will return to clinic every 4 weeks for laboratory work and reticulocyte rate if his hemoglobin falls below 10.0.  He would then return to clinic in 4 months for further evaluation and continuation of treatment. 2. Thrombocytopenia: Platelet count decreased, but stable at 122.  No intervention is needed.  Patient does not require bone marrow biopsy.  Follow-up as above.   3. Chronic renal failure: Patient's creatinine continues to get worse.  Continue close follow-up with nephrology.  He has not initiated dialysis yet. 4. Hypertension: Patient's blood pressure is within normal limits today.  Patient expressed understanding and was in agreement with this plan. He also understands that He can call clinic at any time with any questions, concerns, or complaints.   Lloyd Huger, MD   03/29/2019 7:30 AM

## 2019-03-27 ENCOUNTER — Other Ambulatory Visit: Payer: Self-pay

## 2019-03-28 ENCOUNTER — Inpatient Hospital Stay: Payer: Medicare Other | Attending: Oncology

## 2019-03-28 ENCOUNTER — Encounter: Payer: Self-pay | Admitting: Oncology

## 2019-03-28 ENCOUNTER — Inpatient Hospital Stay: Payer: Medicare Other

## 2019-03-28 ENCOUNTER — Other Ambulatory Visit: Payer: Self-pay

## 2019-03-28 ENCOUNTER — Inpatient Hospital Stay (HOSPITAL_BASED_OUTPATIENT_CLINIC_OR_DEPARTMENT_OTHER): Payer: Medicare Other | Admitting: Oncology

## 2019-03-28 VITALS — BP 138/66 | HR 64 | Temp 97.0°F | Ht 72.0 in | Wt 225.0 lb

## 2019-03-28 DIAGNOSIS — D638 Anemia in other chronic diseases classified elsewhere: Secondary | ICD-10-CM | POA: Insufficient documentation

## 2019-03-28 DIAGNOSIS — E782 Mixed hyperlipidemia: Secondary | ICD-10-CM | POA: Insufficient documentation

## 2019-03-28 DIAGNOSIS — D631 Anemia in chronic kidney disease: Secondary | ICD-10-CM | POA: Diagnosis present

## 2019-03-28 DIAGNOSIS — E1122 Type 2 diabetes mellitus with diabetic chronic kidney disease: Secondary | ICD-10-CM | POA: Insufficient documentation

## 2019-03-28 DIAGNOSIS — N183 Chronic kidney disease, stage 3 unspecified: Secondary | ICD-10-CM

## 2019-03-28 DIAGNOSIS — N189 Chronic kidney disease, unspecified: Secondary | ICD-10-CM | POA: Insufficient documentation

## 2019-03-28 DIAGNOSIS — N184 Chronic kidney disease, stage 4 (severe): Secondary | ICD-10-CM | POA: Insufficient documentation

## 2019-03-28 DIAGNOSIS — Z6834 Body mass index (BMI) 34.0-34.9, adult: Secondary | ICD-10-CM | POA: Insufficient documentation

## 2019-03-28 DIAGNOSIS — I10 Essential (primary) hypertension: Secondary | ICD-10-CM | POA: Insufficient documentation

## 2019-03-28 DIAGNOSIS — E119 Type 2 diabetes mellitus without complications: Secondary | ICD-10-CM | POA: Insufficient documentation

## 2019-03-28 DIAGNOSIS — Z8739 Personal history of other diseases of the musculoskeletal system and connective tissue: Secondary | ICD-10-CM | POA: Insufficient documentation

## 2019-03-28 LAB — IRON AND TIBC
Iron: 45 ug/dL (ref 45–182)
Saturation Ratios: 20 % (ref 17.9–39.5)
TIBC: 226 ug/dL — ABNORMAL LOW (ref 250–450)
UIBC: 181 ug/dL

## 2019-03-28 LAB — CBC WITH DIFFERENTIAL/PLATELET
Abs Immature Granulocytes: 0.01 10*3/uL (ref 0.00–0.07)
Basophils Absolute: 0 10*3/uL (ref 0.0–0.1)
Basophils Relative: 1 %
Eosinophils Absolute: 0.4 10*3/uL (ref 0.0–0.5)
Eosinophils Relative: 7 %
HCT: 23.8 % — ABNORMAL LOW (ref 39.0–52.0)
Hemoglobin: 7.6 g/dL — ABNORMAL LOW (ref 13.0–17.0)
Immature Granulocytes: 0 %
Lymphocytes Relative: 25 %
Lymphs Abs: 1.5 10*3/uL (ref 0.7–4.0)
MCH: 30.4 pg (ref 26.0–34.0)
MCHC: 31.9 g/dL (ref 30.0–36.0)
MCV: 95.2 fL (ref 80.0–100.0)
Monocytes Absolute: 0.5 10*3/uL (ref 0.1–1.0)
Monocytes Relative: 8 %
Neutro Abs: 3.5 10*3/uL (ref 1.7–7.7)
Neutrophils Relative %: 59 %
Platelets: 122 10*3/uL — ABNORMAL LOW (ref 150–400)
RBC: 2.5 MIL/uL — ABNORMAL LOW (ref 4.22–5.81)
RDW: 14.4 % (ref 11.5–15.5)
WBC: 6 10*3/uL (ref 4.0–10.5)
nRBC: 0 % (ref 0.0–0.2)

## 2019-03-28 LAB — FERRITIN: Ferritin: 238 ng/mL (ref 24–336)

## 2019-03-28 MED ORDER — EPOETIN ALFA-EPBX 40000 UNIT/ML IJ SOLN
40000.0000 [IU] | Freq: Once | INTRAMUSCULAR | Status: AC
  Administered 2019-03-28: 40000 [IU] via SUBCUTANEOUS
  Filled 2019-03-28: qty 1

## 2019-03-28 NOTE — Progress Notes (Signed)
Patient stated that he had been doing well with no complaints. 

## 2019-05-05 ENCOUNTER — Inpatient Hospital Stay: Payer: Medicare Other | Attending: Oncology

## 2019-05-05 ENCOUNTER — Inpatient Hospital Stay: Payer: Medicare Other

## 2019-05-23 ENCOUNTER — Other Ambulatory Visit: Payer: Self-pay

## 2019-05-26 ENCOUNTER — Inpatient Hospital Stay: Payer: Medicare Other

## 2019-05-26 ENCOUNTER — Other Ambulatory Visit: Payer: Self-pay

## 2019-05-26 ENCOUNTER — Inpatient Hospital Stay: Payer: Medicare Other | Attending: Oncology

## 2019-05-26 VITALS — BP 132/66

## 2019-05-26 DIAGNOSIS — N183 Chronic kidney disease, stage 3 unspecified: Secondary | ICD-10-CM | POA: Diagnosis not present

## 2019-05-26 DIAGNOSIS — N184 Chronic kidney disease, stage 4 (severe): Secondary | ICD-10-CM

## 2019-05-26 DIAGNOSIS — D631 Anemia in chronic kidney disease: Secondary | ICD-10-CM | POA: Diagnosis present

## 2019-05-26 LAB — CBC WITH DIFFERENTIAL/PLATELET
Abs Immature Granulocytes: 0.02 10*3/uL (ref 0.00–0.07)
Basophils Absolute: 0 10*3/uL (ref 0.0–0.1)
Basophils Relative: 1 %
Eosinophils Absolute: 0.3 10*3/uL (ref 0.0–0.5)
Eosinophils Relative: 5 %
HCT: 27 % — ABNORMAL LOW (ref 39.0–52.0)
Hemoglobin: 8.4 g/dL — ABNORMAL LOW (ref 13.0–17.0)
Immature Granulocytes: 0 %
Lymphocytes Relative: 26 %
Lymphs Abs: 1.5 10*3/uL (ref 0.7–4.0)
MCH: 29.9 pg (ref 26.0–34.0)
MCHC: 31.1 g/dL (ref 30.0–36.0)
MCV: 96.1 fL (ref 80.0–100.0)
Monocytes Absolute: 0.5 10*3/uL (ref 0.1–1.0)
Monocytes Relative: 8 %
Neutro Abs: 3.5 10*3/uL (ref 1.7–7.7)
Neutrophils Relative %: 60 %
Platelets: 122 10*3/uL — ABNORMAL LOW (ref 150–400)
RBC: 2.81 MIL/uL — ABNORMAL LOW (ref 4.22–5.81)
RDW: 13.9 % (ref 11.5–15.5)
WBC: 5.8 10*3/uL (ref 4.0–10.5)
nRBC: 0 % (ref 0.0–0.2)

## 2019-05-26 LAB — IRON AND TIBC
Iron: 46 ug/dL (ref 45–182)
Saturation Ratios: 21 % (ref 17.9–39.5)
TIBC: 215 ug/dL — ABNORMAL LOW (ref 250–450)
UIBC: 169 ug/dL

## 2019-05-26 LAB — FERRITIN: Ferritin: 150 ng/mL (ref 24–336)

## 2019-05-26 MED ORDER — EPOETIN ALFA-EPBX 40000 UNIT/ML IJ SOLN
40000.0000 [IU] | Freq: Once | INTRAMUSCULAR | Status: AC
  Administered 2019-05-26: 40000 [IU] via SUBCUTANEOUS
  Filled 2019-05-26: qty 1

## 2019-06-14 DIAGNOSIS — N2581 Secondary hyperparathyroidism of renal origin: Secondary | ICD-10-CM | POA: Insufficient documentation

## 2019-06-14 DIAGNOSIS — R809 Proteinuria, unspecified: Secondary | ICD-10-CM | POA: Insufficient documentation

## 2019-06-20 ENCOUNTER — Other Ambulatory Visit: Payer: Self-pay

## 2019-06-23 ENCOUNTER — Inpatient Hospital Stay: Payer: Medicare Other | Attending: Oncology

## 2019-06-23 ENCOUNTER — Inpatient Hospital Stay: Payer: Medicare Other

## 2019-06-23 ENCOUNTER — Other Ambulatory Visit: Payer: Self-pay

## 2019-06-23 VITALS — BP 149/66 | HR 66

## 2019-06-23 DIAGNOSIS — D631 Anemia in chronic kidney disease: Secondary | ICD-10-CM | POA: Diagnosis present

## 2019-06-23 DIAGNOSIS — N183 Chronic kidney disease, stage 3 unspecified: Secondary | ICD-10-CM | POA: Diagnosis not present

## 2019-06-23 DIAGNOSIS — N184 Chronic kidney disease, stage 4 (severe): Secondary | ICD-10-CM

## 2019-06-23 LAB — CBC WITH DIFFERENTIAL/PLATELET
Abs Immature Granulocytes: 0.01 10*3/uL (ref 0.00–0.07)
Basophils Absolute: 0 10*3/uL (ref 0.0–0.1)
Basophils Relative: 1 %
Eosinophils Absolute: 0.3 10*3/uL (ref 0.0–0.5)
Eosinophils Relative: 5 %
HCT: 29.6 % — ABNORMAL LOW (ref 39.0–52.0)
Hemoglobin: 9.4 g/dL — ABNORMAL LOW (ref 13.0–17.0)
Immature Granulocytes: 0 %
Lymphocytes Relative: 24 %
Lymphs Abs: 1.6 10*3/uL (ref 0.7–4.0)
MCH: 30.5 pg (ref 26.0–34.0)
MCHC: 31.8 g/dL (ref 30.0–36.0)
MCV: 96.1 fL (ref 80.0–100.0)
Monocytes Absolute: 0.5 10*3/uL (ref 0.1–1.0)
Monocytes Relative: 7 %
Neutro Abs: 4.2 10*3/uL (ref 1.7–7.7)
Neutrophils Relative %: 63 %
Platelets: 141 10*3/uL — ABNORMAL LOW (ref 150–400)
RBC: 3.08 MIL/uL — ABNORMAL LOW (ref 4.22–5.81)
RDW: 14 % (ref 11.5–15.5)
WBC: 6.6 10*3/uL (ref 4.0–10.5)
nRBC: 0 % (ref 0.0–0.2)

## 2019-06-23 LAB — IRON AND TIBC
Iron: 61 ug/dL (ref 45–182)
Saturation Ratios: 27 % (ref 17.9–39.5)
TIBC: 223 ug/dL — ABNORMAL LOW (ref 250–450)
UIBC: 162 ug/dL

## 2019-06-23 LAB — FERRITIN: Ferritin: 126 ng/mL (ref 24–336)

## 2019-06-23 MED ORDER — EPOETIN ALFA-EPBX 40000 UNIT/ML IJ SOLN
40000.0000 [IU] | Freq: Once | INTRAMUSCULAR | Status: AC
  Administered 2019-06-23: 11:00:00 40000 [IU] via SUBCUTANEOUS
  Filled 2019-06-23: qty 1

## 2019-07-27 NOTE — Progress Notes (Signed)
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  INTERVAL HISTORY: Patient returns to clinic today for repeat laboratory work, further evaluation, and continuation of Retacrit.  He continues to feel well and remains asymptomatic.  He denies any weakness or fatigue.  He has no neurologic complaints. He denies any recent fevers or illnesses. He has a good appetite and denies weight loss.  He denies any chest pain, shortness of breath, cough, or hemoptysis.  He denies any nausea, vomiting, constipation, or diarrhea. He denies any melena or hematochezia.  He has no urinary complaints.  Patient feels at his baseline and offers no specific complaints today.  REVIEW OF SYSTEMS:   Review of Systems  Constitutional: Negative.  Negative for fever, malaise/fatigue and weight loss.  Respiratory: Negative.  Negative for cough and shortness of breath.   Cardiovascular: Negative.  Negative for chest pain and leg swelling.  Gastrointestinal: Negative.  Negative for abdominal pain, blood in stool and melena.  Genitourinary: Negative.  Negative for hematuria.  Musculoskeletal: Negative.  Negative for back pain.  Skin: Negative.  Negative for rash.  Neurological: Negative.  Negative for sensory change, focal weakness, weakness and headaches.  Psychiatric/Behavioral: Negative.  The patient is not nervous/anxious.     As per HPI. Otherwise, a complete review of systems is negative.  PAST MEDICAL HISTORY: Past Medical History:  Diagnosis Date  . Anemia   . Arthritis   . Chronic kidney disease    Stage 4 CKD  . Diabetes mellitus without complication ([place])   . GERD (gastroesophageal reflux disease)    h/o  . Gout   . Hyperlipidemia, mixed   .  Hypertension   . Sleep apnea    h/o no cpap    PAST SURGICAL HISTORY: Past Surgical History:  Procedure Laterality Date  . APPENDECTOMY    . Bunion Removal Right   . COLONOSCOPY WITH PROPOFOL N/A 06/14/2015   Procedure: COLONOSCOPY WITH PROPOFOL;  Surgeon: Lollie Sails, MD;  Location: [facility] ENDOSCOPY;  Service: Endoscopy;  Laterality: N/A;  . HERNIA REPAIR    . PROSTATE BIOPSY N/A 03/27/2017   Procedure: PROSTATE BIOPSY-URO NAV;  Surgeon: Royston Cowper, MD;  Location: [facility] ORS;  Service: Urology;  Laterality: N/A;  . RETINAL DETACHMENT SURGERY Left   . TRANSURETHRAL RESECTION OF PROSTATE      FAMILY HISTORY: Family History  Problem Relation Age of Onset  . Leukemia Father   . Leukemia Brother     ADVANCED DIRECTIVES (Y/N):  N  HEALTH MAINTENANCE: Social History   Tobacco Use  . Smoking status: Never Smoker  . Smokeless tobacco: Never Used  Substance Use Topics  . Alcohol use: Yes    Comment: occ  . Drug use: No     Colonoscopy:  PAP:  Bone density:  Lipid panel:  Allergies  Allergen Reactions  . Doxazosin Other (See Comments)    unknown Other reaction(s): Other (See Comments), Other (See Comments), Unknown unknown unknown  . Shellfish Allergy Swelling    Current Outpatient Medications  Medication Sig Dispense Refill  . acetaminophen (TYLENOL) 500 MG tablet Take 500 mg by mouth every 8 (eight) hours as needed for mild pain or headache.    Marland Kitchen amLODipine (NORVASC) 10 MG tablet Take 10 mg by mouth at bedtime.     Marland Kitchen  aspirin EC 81 MG tablet Take 81 mg by mouth daily.    . bicalutamide (CASODEX) 50 MG tablet Take 50 mg by mouth daily.     . calcitRIOL (ROCALTROL) 0.25 MCG capsule Take 0.25 mcg by mouth daily.     . Cholecalciferol 2000 UNITS CAPS Take 2,000 Units by mouth daily.    . ciprofloxacin (CIPRO) 500 MG tablet Take 1 tablet (500 mg total) by mouth 2 (two) times daily. 6 tablet 0  . enalapril (VASOTEC) 20 MG tablet Take 20 mg by mouth at bedtime.      . finasteride (PROSCAR) 5 MG tablet Take 5 mg by mouth at bedtime.     . fluticasone (FLONASE) 50 MCG/ACT nasal spray Place 1 spray into both nostrils at bedtime.    Marland Kitchen glimepiride (AMARYL) 4 MG tablet Take 2 mg by mouth daily with breakfast.    . glucose blood (ONE TOUCH ULTRA TEST) test strip Use as directed. Check CBG's fasting daily. Dx: E11.22, N18.4    . glucose blood test strip USE TO CHECK FASTING BLOOD SUGAR ONCE DAILY    . hydrALAZINE (APRESOLINE) 100 MG tablet Take 100 mg by mouth 3 (three) times daily.    Marland Kitchen loratadine (CLARITIN) 10 MG tablet Take 10 mg by mouth daily as needed for allergies.     Marland Kitchen lovastatin (MEVACOR) 20 MG tablet Take 20 mg by mouth at bedtime.    . metoprolol (LOPRESSOR) 50 MG tablet Take 50 mg by mouth 2 (two) times daily.    . sodium bicarbonate 650 MG tablet Take 1,300 mg by mouth 2 (two) times daily.    Marland Kitchen trolamine salicylate (ASPERCREME) 10 % cream Apply 1 application topically as needed for muscle pain.    . Insulin Pen Needle (NOVOTWIST) 32G X 5 MM MISC Use 1 Units once daily. Dx: E11.9    . ULORIC 40 MG tablet Take 40 mg by mouth daily.      No current facility-administered medications for this visit.     OBJECTIVE: Vitals:   07/28/19 1005  BP: (!) 161/70  Pulse: 66  Resp: 18  Temp: (!) 96.6 F (35.9 C)  SpO2: 100%     Body mass index is 30.43 kg/m.    ECOG FS:0 - Asymptomatic  General: Well-developed, well-nourished, no acute distress. Eyes: Pink conjunctiva, anicteric sclera. HEENT: Normocephalic, moist mucous membranes. Lungs: Clear to auscultation bilaterally. Heart: Regular rate and rhythm. No rubs, murmurs, or gallops. Abdomen: Soft, nontender, nondistended. No organomegaly noted, normoactive bowel sounds. Musculoskeletal: No edema, cyanosis, or clubbing. Neuro: Alert, answering all questions appropriately. Cranial nerves grossly intact. Skin: No rashes or petechiae noted. Psych: Normal affect.  LAB RESULTS:  Lab Results   Component Value Date   NA 140 06/17/2018   K 4.2 06/17/2018   CL 112 (H) 06/17/2018   CO2 21 (L) 06/17/2018   GLUCOSE 186 (H) 06/17/2018   BUN 76 (H) 06/17/2018   CREATININE 5.56 (H) 06/17/2018   CALCIUM 11.3 (H) 06/17/2018   PROT 7.3 10/14/2012   ALBUMIN 2.4 (L) 10/14/2012   AST 15 10/14/2012   ALT 15 10/14/2012   ALKPHOS 84 10/14/2012   BILITOT 0.3 10/14/2012   GFRNONAA 9 (L) 06/17/2018   GFRAA 10 (L) 06/17/2018    Lab Results  Component Value Date   WBC 6.1 07/28/2019   NEUTROABS 3.7 07/28/2019   HGB 8.9 (L) 07/28/2019   HCT 29.4 (L) 07/28/2019   MCV 97.0 07/28/2019   PLT 121 (L) 07/28/2019  Lab Results  Component Value Date   IRON 70 07/28/2019   TIBC 211 (L) 07/28/2019   IRONPCTSAT 33 07/28/2019   Lab Results  Component Value Date   FERRITIN 154 07/28/2019     STUDIES: No results found.  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  PLAN:    1. 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  Iron stores continue to be within normal limits.  He does not require additional Feraheme today.  Proceed with 40,000 units subcutaneous Retacrit.  Return to clinic every 4 weeks for laboratory work and Retacrit if his hemoglobin remains below 10.0.  Patient will then return to clinic in 4 months for repeat laboratory work and further evaluation.   2. Thrombocytopenia: Chronic and unchanged.  No intervention is needed.  Patient does not require bone marrow biopsy.  Follow-up as above.   3. Chronic renal failure: Chronic and unchanged.  Continue close follow-up with nephrology.  He has not initiated dialysis yet. 4. Hypertension: Patient's blood pressure is mildly elevated.  Proceed with treatment as above.  Patient expressed understanding and was in agreement with this plan. He also understands that He can call clinic at any time with any questions, concerns, or complaints.   Lloyd Huger, MD   07/28/2019 2:45 PM

## 2019-07-28 ENCOUNTER — Inpatient Hospital Stay: Payer: Medicare Other

## 2019-07-28 ENCOUNTER — Other Ambulatory Visit: Payer: Self-pay

## 2019-07-28 ENCOUNTER — Encounter: Payer: Self-pay | Admitting: Oncology

## 2019-07-28 ENCOUNTER — Inpatient Hospital Stay (HOSPITAL_BASED_OUTPATIENT_CLINIC_OR_DEPARTMENT_OTHER): Payer: Medicare Other | Admitting: Oncology

## 2019-07-28 ENCOUNTER — Inpatient Hospital Stay: Payer: Medicare Other | Attending: Oncology

## 2019-07-28 VITALS — BP 161/70 | HR 66 | Temp 96.6°F | Resp 18 | Wt 224.4 lb

## 2019-07-28 DIAGNOSIS — N183 Chronic kidney disease, stage 3 unspecified: Secondary | ICD-10-CM

## 2019-07-28 DIAGNOSIS — N184 Chronic kidney disease, stage 4 (severe): Secondary | ICD-10-CM

## 2019-07-28 DIAGNOSIS — D631 Anemia in chronic kidney disease: Secondary | ICD-10-CM | POA: Insufficient documentation

## 2019-07-28 LAB — CBC WITH DIFFERENTIAL/PLATELET
Abs Immature Granulocytes: 0.01 10*3/uL (ref 0.00–0.07)
Basophils Absolute: 0 10*3/uL (ref 0.0–0.1)
Basophils Relative: 1 %
Eosinophils Absolute: 0.3 10*3/uL (ref 0.0–0.5)
Eosinophils Relative: 5 %
HCT: 29.4 % — ABNORMAL LOW (ref 39.0–52.0)
Hemoglobin: 8.9 g/dL — ABNORMAL LOW (ref 13.0–17.0)
Immature Granulocytes: 0 %
Lymphocytes Relative: 25 %
Lymphs Abs: 1.5 10*3/uL (ref 0.7–4.0)
MCH: 29.4 pg (ref 26.0–34.0)
MCHC: 30.3 g/dL (ref 30.0–36.0)
MCV: 97 fL (ref 80.0–100.0)
Monocytes Absolute: 0.5 10*3/uL (ref 0.1–1.0)
Monocytes Relative: 8 %
Neutro Abs: 3.7 10*3/uL (ref 1.7–7.7)
Neutrophils Relative %: 61 %
Platelets: 121 10*3/uL — ABNORMAL LOW (ref 150–400)
RBC: 3.03 MIL/uL — ABNORMAL LOW (ref 4.22–5.81)
RDW: 14.6 % (ref 11.5–15.5)
WBC: 6.1 10*3/uL (ref 4.0–10.5)
nRBC: 0 % (ref 0.0–0.2)

## 2019-07-28 LAB — FERRITIN: Ferritin: 154 ng/mL (ref 24–336)

## 2019-07-28 LAB — IRON AND TIBC
Iron: 70 ug/dL (ref 45–182)
Saturation Ratios: 33 % (ref 17.9–39.5)
TIBC: 211 ug/dL — ABNORMAL LOW (ref 250–450)
UIBC: 141 ug/dL

## 2019-07-28 MED ORDER — EPOETIN ALFA-EPBX 40000 UNIT/ML IJ SOLN
40000.0000 [IU] | Freq: Once | INTRAMUSCULAR | Status: AC
  Administered 2019-07-28: 40000 [IU] via SUBCUTANEOUS
  Filled 2019-07-28: qty 1

## 2019-07-28 NOTE — Progress Notes (Signed)
Patient here for a follow-up with no concerns but does complain of runny nose and sinuses pressure.

## 2019-08-01 ENCOUNTER — Other Ambulatory Visit: Payer: Self-pay | Admitting: Urology

## 2019-08-01 DIAGNOSIS — R972 Elevated prostate specific antigen [PSA]: Secondary | ICD-10-CM

## 2019-08-06 ENCOUNTER — Other Ambulatory Visit: Payer: Self-pay | Admitting: Urology

## 2019-08-06 ENCOUNTER — Other Ambulatory Visit: Payer: Self-pay

## 2019-08-06 ENCOUNTER — Ambulatory Visit
Admission: RE | Admit: 2019-08-06 | Discharge: 2019-08-06 | Disposition: A | Discharge status: Home / Self Care | Payer: Medicare Other | Source: Ambulatory Visit | Attending: Urology | Admitting: Urology

## 2019-08-06 DIAGNOSIS — R972 Elevated prostate specific antigen [PSA]: Secondary | ICD-10-CM | POA: Diagnosis present

## 2019-08-06 LAB — POCT I-STAT CREATININE
Creatinine, Ser: 4 mg/dL — ABNORMAL HIGH (ref 0.61–1.24)
Creatinine, Ser: 4.1 mg/dL — ABNORMAL HIGH (ref 0.61–1.24)

## 2019-08-22 NOTE — H&P (Signed)
[name] [name], [facility] MEDICAL RECORD WU:[id number] ACCOUNT [id number] DATE OF BIRTH:Dec 17, 1940 FACILITY: [facility] LOCATION:  PHYSICIAN:Guerry Covington Farrel Conners, MD  HISTORY AND PHYSICAL  DATE OF ADMISSION:  09/10/2018  CHIEF COMPLAINT:  Elevated and rising PSA.  HISTORY OF PRESENT ILLNESS:  The patient is a 79 year old African-American male with an elevated PSA of 42.8 ng/mL on 12/02.  He underwent MRI scan on 12/16, which indicated a 66.6 cc prostate with a 1.4 cm PI-RADS 4 lesion of the left posterior zone and  a 2.9 cm PI-RADS 3 lesion on the left central zone area.  He comes in now for UroNav fusion biopsy of the prostate.  The patient underwent image-guided fusion biopsy with the UroNav system back in 2018 and all biopsies were benign.  ALLERGIES:  PROQUIN AND CARDURA.  CURRENT MEDICATIONS:  Included enalapril, lovastatin, hydralazine, amlodipine, glimepiride, and finasteride.  PAST SURGICAL HISTORY: 1.  Appendectomy. 2.  Colonoscopy. 3.  Inguinal herniorrhaphy. 4.  Repair of retinal detachment. 5.  Transurethral microwave thermotherapy.  PAST AND CURRENT MEDICAL CONDITIONS: 1.  Stage IV chronic renal disease. 2.  Anemia. 3.  Arthritis. 4.  Diabetes. 5.  Gout. 6.  Hyperlipidemia. 7.  Hypertension. 8.  Sleep apnea. 9.  BPH with lower urinary tract symptoms.  REVIEW OF SYSTEMS:  The patient denies chest pain, shortness of breath, or stroke.  He also denies history of heart disease.  SOCIAL HISTORY:  The patient denied tobacco or alcohol use.  FAMILY HISTORY:  Father died of leukemia.  He has a brother with leukemia.  There is no family history of prostate cancer.  PHYSICAL EXAMINATION: VITAL SIGNS:  Height 5 feet 11 inches, weight 226 pounds, BMI 32. GENERAL:  A well-nourished African-American male in no distress. HEENT:  Sclerae were clear.  Pupils are equally round, reactive to light and accommodation.  Extraocular movements are intact. NECK:  No palpable cervical  adenopathy. PULMONARY:  Lungs clear to auscultation. CARDIOVASCULAR:  Regular rhythm and rate without audible murmurs. ABDOMEN:  Soft, nontender abdomen. GENITOURINARY:  He was circumcised.  Testes were smooth, nontender. RECTAL:  40 g, smooth, nontender prostate. NEUROMUSCULAR:  Alert and oriented x3.  IMPRESSION: 1.  Elevated PSA. 2.  PI-RADS 4 and PI-RADS 3 lesions on MRI scan.  PLAN:  UroNav fusion biopsy of the prostate.  CN/NUANCE  D:08/20/2019 T:08/20/2019 JOB:[id number]

## 2019-08-25 ENCOUNTER — Inpatient Hospital Stay: Payer: Medicare Other | Attending: Oncology

## 2019-08-25 ENCOUNTER — Inpatient Hospital Stay: Payer: Medicare Other

## 2019-08-25 ENCOUNTER — Other Ambulatory Visit: Payer: Self-pay

## 2019-08-25 VITALS — BP 143/66 | HR 63 | Resp 17

## 2019-08-25 DIAGNOSIS — N184 Chronic kidney disease, stage 4 (severe): Secondary | ICD-10-CM

## 2019-08-25 DIAGNOSIS — D631 Anemia in chronic kidney disease: Secondary | ICD-10-CM | POA: Insufficient documentation

## 2019-08-25 DIAGNOSIS — N183 Chronic kidney disease, stage 3 unspecified: Secondary | ICD-10-CM

## 2019-08-25 LAB — CBC WITH DIFFERENTIAL/PLATELET
Abs Immature Granulocytes: 0.01 10*3/uL (ref 0.00–0.07)
Basophils Absolute: 0 10*3/uL (ref 0.0–0.1)
Basophils Relative: 1 %
Eosinophils Absolute: 0.3 10*3/uL (ref 0.0–0.5)
Eosinophils Relative: 5 %
HCT: 30.9 % — ABNORMAL LOW (ref 39.0–52.0)
Hemoglobin: 9.1 g/dL — ABNORMAL LOW (ref 13.0–17.0)
Immature Granulocytes: 0 %
Lymphocytes Relative: 22 %
Lymphs Abs: 1.2 10*3/uL (ref 0.7–4.0)
MCH: 28.7 pg (ref 26.0–34.0)
MCHC: 29.4 g/dL — ABNORMAL LOW (ref 30.0–36.0)
MCV: 97.5 fL (ref 80.0–100.0)
Monocytes Absolute: 0.4 10*3/uL (ref 0.1–1.0)
Monocytes Relative: 6 %
Neutro Abs: 3.9 10*3/uL (ref 1.7–7.7)
Neutrophils Relative %: 66 %
Platelets: 134 10*3/uL — ABNORMAL LOW (ref 150–400)
RBC: 3.17 MIL/uL — ABNORMAL LOW (ref 4.22–5.81)
RDW: 14.8 % (ref 11.5–15.5)
WBC: 5.8 10*3/uL (ref 4.0–10.5)
nRBC: 0 % (ref 0.0–0.2)

## 2019-08-25 LAB — IRON AND TIBC
Iron: 68 ug/dL (ref 45–182)
Saturation Ratios: 32 % (ref 17.9–39.5)
TIBC: 215 ug/dL — ABNORMAL LOW (ref 250–450)
UIBC: 147 ug/dL

## 2019-08-25 LAB — FERRITIN: Ferritin: 127 ng/mL (ref 24–336)

## 2019-08-25 MED ORDER — EPOETIN ALFA-EPBX 40000 UNIT/ML IJ SOLN
40000.0000 [IU] | Freq: Once | INTRAMUSCULAR | Status: AC
  Administered 2019-08-25: 40000 [IU] via SUBCUTANEOUS
  Filled 2019-08-25: qty 1

## 2019-09-08 ENCOUNTER — Encounter
Admission: RE | Admit: 2019-09-08 | Discharge: 2019-09-08 | Disposition: A | Discharge status: Home / Self Care | Payer: Medicare Other | Source: Ambulatory Visit | Attending: Urology | Admitting: Urology

## 2019-09-08 ENCOUNTER — Other Ambulatory Visit: Payer: Self-pay

## 2019-09-08 NOTE — Patient Instructions (Addendum)
Your procedure is scheduled on: Thursday 09/11/19.  Report to DAY SURGERY DEPARTMENT LOCATED ON 2ND FLOOR MEDICAL MALL ENTRANCE. To find out your arrival time please call [phone] between 1PM - 3PM on Wednesday 09/10/19   Remember: Instructions that are not followed completely may result in serious medical risk, up to and including death, or upon the discretion of your surgeon and anesthesiologist your surgery may need to be rescheduled.      _X__ 1. Do not eat food after midnight the night before your procedure.                 No gum chewing or hard candies. You may drink SUGAR FREE clear liquids up to 2 hours                 before you are scheduled to arrive for your surgery- DO NOT drink clear                 liquids within 2 hours of the start of your surgery.                   __X__2.  On the morning of surgery brush your teeth with toothpaste and water, you may rinse your mouth with mouthwash if you wish.  Do not swallow any toothpaste or mouthwash.      _X__ 3.  No Alcohol for 24 hours before or after surgery.    __X__4.  Notify your doctor if there is any change in your medical condition      (cold, fever, infections).       Do not wear jewelry, make-up, hairpins, clips or nail polish. Do not wear lotions, powders, or perfumes.  Do not shave 48 hours prior to surgery. Men may shave face and neck. Do not bring valuables to the hospital.     [facility] is not responsible for any belongings or valuables.   Contacts, dentures/partials or body piercings may not be worn into surgery. Bring a case for your contacts, glasses or hearing aids, a denture cup will be supplied.     Patients discharged the day of surgery will not be allowed to drive home.     __X__ Take these medicines the morning of surgery with A SIP OF WATER:     1. hydrALAZINE (APRESOLINE) 100 MG tablet  2. metoprolol (LOPRESSOR) 50 MG tablet  3. sodium bicarbonate 650 MG tablet  4. ULORIC 40 MG  tablet     __X__ Fleet Enema (as directed)     __X__ Stop Blood Thinners: Aspirin. You have already stopped taking this.    __X__ Stop Anti-inflammatories 7 days before surgery such as Advil, Ibuprofen, Motrin, BC or Goodies Powder, Naprosyn, Naproxen, Aleve, Aspirin, Meloxicam. May take Tylenol if needed for pain or discomfort.     __X__ Don't start taking any new herbal supplements before your surgery.

## 2019-09-09 ENCOUNTER — Encounter
Admission: RE | Admit: 2019-09-09 | Discharge: 2019-09-09 | Disposition: A | Discharge status: Home / Self Care | Payer: Medicare Other | Source: Ambulatory Visit | Attending: Urology | Admitting: Urology

## 2019-09-09 ENCOUNTER — Other Ambulatory Visit: Admission: RE | Admit: 2019-09-09 | Payer: Medicare Other | Source: Ambulatory Visit

## 2019-09-09 ENCOUNTER — Other Ambulatory Visit: Payer: Medicare Other

## 2019-09-09 DIAGNOSIS — Z01818 Encounter for other preprocedural examination: Secondary | ICD-10-CM | POA: Diagnosis not present

## 2019-09-09 DIAGNOSIS — E118 Type 2 diabetes mellitus with unspecified complications: Secondary | ICD-10-CM | POA: Insufficient documentation

## 2019-09-09 DIAGNOSIS — I10 Essential (primary) hypertension: Secondary | ICD-10-CM | POA: Diagnosis not present

## 2019-09-09 DIAGNOSIS — Z20822 Contact with and (suspected) exposure to covid-19: Secondary | ICD-10-CM | POA: Diagnosis not present

## 2019-09-09 LAB — SARS CORONAVIRUS 2 (TAT 6-24 HRS): SARS Coronavirus 2: NEGATIVE

## 2019-09-10 MED ORDER — CEFAZOLIN SODIUM-DEXTROSE 1-4 GM/50ML-% IV SOLN
1.0000 g | Freq: Once | INTRAVENOUS | Status: AC
  Administered 2019-09-11: 09:00:00 1 g via INTRAVENOUS

## 2019-09-10 MED ORDER — GENTAMICIN SULFATE 40 MG/ML IJ SOLN
80.0000 mg | Freq: Once | INTRAVENOUS | Status: DC
  Filled 2019-09-10: qty 2

## 2019-09-11 ENCOUNTER — Ambulatory Visit: Payer: Medicare Other | Admitting: Anesthesiology

## 2019-09-11 ENCOUNTER — Encounter
Admission: RE | Disposition: A | Discharge status: Home / Self Care | Payer: Self-pay | Source: Home / Self Care | Attending: Urology

## 2019-09-11 ENCOUNTER — Other Ambulatory Visit: Payer: Self-pay

## 2019-09-11 ENCOUNTER — Encounter: Payer: Self-pay | Admitting: Urology

## 2019-09-11 ENCOUNTER — Ambulatory Visit
Admission: RE | Admit: 2019-09-11 | Discharge: 2019-09-11 | Disposition: A | Discharge status: Home / Self Care | Payer: Medicare Other | Attending: Urology | Admitting: Urology

## 2019-09-11 DIAGNOSIS — R972 Elevated prostate specific antigen [PSA]: Secondary | ICD-10-CM | POA: Diagnosis present

## 2019-09-11 DIAGNOSIS — M1990 Unspecified osteoarthritis, unspecified site: Secondary | ICD-10-CM | POA: Insufficient documentation

## 2019-09-11 DIAGNOSIS — E785 Hyperlipidemia, unspecified: Secondary | ICD-10-CM | POA: Insufficient documentation

## 2019-09-11 DIAGNOSIS — M109 Gout, unspecified: Secondary | ICD-10-CM | POA: Diagnosis not present

## 2019-09-11 DIAGNOSIS — N184 Chronic kidney disease, stage 4 (severe): Secondary | ICD-10-CM | POA: Insufficient documentation

## 2019-09-11 DIAGNOSIS — Z806 Family history of leukemia: Secondary | ICD-10-CM | POA: Insufficient documentation

## 2019-09-11 DIAGNOSIS — E782 Mixed hyperlipidemia: Secondary | ICD-10-CM | POA: Diagnosis not present

## 2019-09-11 DIAGNOSIS — K219 Gastro-esophageal reflux disease without esophagitis: Secondary | ICD-10-CM | POA: Diagnosis not present

## 2019-09-11 DIAGNOSIS — E1122 Type 2 diabetes mellitus with diabetic chronic kidney disease: Secondary | ICD-10-CM | POA: Insufficient documentation

## 2019-09-11 DIAGNOSIS — C61 Malignant neoplasm of prostate: Secondary | ICD-10-CM | POA: Insufficient documentation

## 2019-09-11 DIAGNOSIS — D631 Anemia in chronic kidney disease: Secondary | ICD-10-CM | POA: Insufficient documentation

## 2019-09-11 DIAGNOSIS — I129 Hypertensive chronic kidney disease with stage 1 through stage 4 chronic kidney disease, or unspecified chronic kidney disease: Secondary | ICD-10-CM | POA: Diagnosis not present

## 2019-09-11 DIAGNOSIS — N401 Enlarged prostate with lower urinary tract symptoms: Secondary | ICD-10-CM | POA: Insufficient documentation

## 2019-09-11 DIAGNOSIS — Z888 Allergy status to other drugs, medicaments and biological substances status: Secondary | ICD-10-CM | POA: Diagnosis not present

## 2019-09-11 DIAGNOSIS — Z79899 Other long term (current) drug therapy: Secondary | ICD-10-CM | POA: Diagnosis not present

## 2019-09-11 HISTORY — PX: PROSTATE BIOPSY: SHX241

## 2019-09-11 LAB — GLUCOSE, CAPILLARY: Glucose-Capillary: 98 mg/dL (ref 70–99)

## 2019-09-11 SURGERY — BIOPSY, PROSTATE
Anesthesia: General | Site: Prostate

## 2019-09-11 MED ORDER — PROPOFOL 500 MG/50ML IV EMUL
INTRAVENOUS | Status: DC | PRN
  Administered 2019-09-11: 150 ug/kg/min via INTRAVENOUS

## 2019-09-11 MED ORDER — ONDANSETRON HCL 4 MG/2ML IJ SOLN: 4.0000 mg | Freq: Once | INTRAMUSCULAR | Status: DC | PRN

## 2019-09-11 MED ORDER — FAMOTIDINE 20 MG PO TABS
ORAL_TABLET | ORAL | Status: AC
  Administered 2019-09-11: 20 mg via ORAL
  Filled 2019-09-11: qty 1

## 2019-09-11 MED ORDER — ACETAMINOPHEN 325 MG PO TABS: 325.0000 mg | ORAL_TABLET | ORAL | Status: DC | PRN

## 2019-09-11 MED ORDER — SULFAMETHOXAZOLE-TRIMETHOPRIM 800-160 MG PO TABS: 1.0000 | ORAL_TABLET | Freq: Two times a day (BID) | ORAL | 0 refills | Status: DC

## 2019-09-11 MED ORDER — SODIUM CHLORIDE 0.9 % IV SOLN
INTRAVENOUS | Status: DC
  Administered 2019-09-11: 08:00:00 50 mL/h via INTRAVENOUS

## 2019-09-11 MED ORDER — FAMOTIDINE 20 MG PO TABS: 20.0000 mg | ORAL_TABLET | Freq: Once | ORAL | Status: AC

## 2019-09-11 MED ORDER — GENTAMICIN IN SALINE 1.6-0.9 MG/ML-% IV SOLN
80.0000 mg | INTRAVENOUS | Status: AC
  Administered 2019-09-11: 80 mg via INTRAVENOUS
  Filled 2019-09-11: qty 50

## 2019-09-11 MED ORDER — HYDROCODONE-ACETAMINOPHEN 7.5-325 MG PO TABS
1.0000 | ORAL_TABLET | Freq: Once | ORAL | Status: DC | PRN
  Filled 2019-09-11: qty 1

## 2019-09-11 MED ORDER — PROPOFOL 500 MG/50ML IV EMUL
INTRAVENOUS | Status: AC
  Filled 2019-09-11: qty 50

## 2019-09-11 MED ORDER — ACETAMINOPHEN 160 MG/5ML PO SOLN
325.0000 mg | ORAL | Status: DC | PRN
  Filled 2019-09-11: qty 20.3

## 2019-09-11 MED ORDER — PHENYLEPHRINE HCL (PRESSORS) 10 MG/ML IV SOLN
INTRAVENOUS | Status: DC | PRN
  Administered 2019-09-11: 100 ug via INTRAVENOUS

## 2019-09-11 MED ORDER — CEFAZOLIN SODIUM-DEXTROSE 1-4 GM/50ML-% IV SOLN
INTRAVENOUS | Status: AC
  Filled 2019-09-11: qty 50

## 2019-09-11 MED ORDER — FLEET ENEMA 7-19 GM/118ML RE ENEM: 1.0000 | ENEMA | Freq: Once | RECTAL | Status: DC

## 2019-09-11 SURGICAL SUPPLY — 10 items
COVER MAYO STAND REUSABLE (DRAPES) ×3 IMPLANT
COVER WAND RF STERILE (DRAPES) ×3 IMPLANT
GLOVE BIO SURGEON STRL SZ7 (GLOVE) ×6 IMPLANT
GUIDE NDL ENDOCAV 16-18 CVR (NEEDLE) IMPLANT
GUIDE NEEDLE ENDOCAV 16-18 CVR (NEEDLE) IMPLANT
INST BIOPSY MAXCORE 18GX25 (NEEDLE) ×3 IMPLANT
NDL GUIDE BIOPSY 644068 (NEEDLE) IMPLANT
NEEDLE GUIDE BIOPSY 644068 (NEEDLE) IMPLANT
SURGILUBE 2OZ TUBE FLIPTOP (MISCELLANEOUS) ×3 IMPLANT
TOWEL OR 17X26 4PK STRL BLUE (TOWEL DISPOSABLE) ×3 IMPLANT

## 2019-09-11 NOTE — Discharge Instructions (Addendum)
Transrectal Ultrasound-Guided Prostate Biopsy, Care After This sheet gives you information about how to care for yourself after your procedure. Your doctor may also give you more specific instructions. If you have problems or questions, contact your doctor. What can I expect after the procedure? After the procedure, it is common to have:  Pain and discomfort in your butt, especially while sitting.  Pink-colored pee (urine), due to small amounts of blood in the pee.  Burning while peeing (urinating).  Blood in your poop (stool).  Bleeding from your butt.  Blood in your semen. Follow these instructions at home: Medicines  Take over-the-counter and prescription medicines only as told by your doctor.  If you were prescribed antibiotic medicine, take it as told by your doctor. Do not stop taking the antibiotic even if you start to feel better. Activity   Do not drive for 24 hours if you were given a medicine to help you relax (sedative) during your procedure.  Return to your normal activities as told by your doctor. Ask your doctor what activities are safe for you.  Ask your doctor when it is okay for you to have sex.  Do not lift anything that is heavier than 10 lb (4.5 kg), or the limit that you are told, until your doctor says that it is safe. General instructions   Drink enough water to keep your pee pale yellow.  Watch your pee, poop, and semen for new bleeding or bleeding that gets worse.  Keep all follow-up visits as told by your doctor. This is important. Contact a doctor if you:  Have blood clots in your pee or poop.  Notice that your pee smells bad or unusual.  Have very bad belly pain.  Have trouble peeing.  Notice that your lower belly feels firm.  Have blood in your pee for more than 2 weeks after the procedure.  Have blood in your semen for more than 2 months after the procedure.  Have problems getting an erection.  Feel sick to your stomach  (nauseous).  Throw up (vomit).  Have new or worse bleeding in your pee, poop, or semen. Get help right away if you:  Have a fever or chills.  Have bright red pee.  Have very bad pain that does not get better with medicine.  Cannot pee. Summary  After this procedure, it is common to have pain and discomfort around your butt, especially while sitting.  You may have blood in your pee and poop.  It is common to have blood in your semen for 1-2 months.  If you were prescribed antibiotic medicine, take it as told by your doctor. Do not stop taking the antibiotic even if you start to feel better.  Get help right away if you have a fever or chills. This information is not intended to replace advice given to you by your health care provider. Make sure you discuss any questions you have with your health care provider. Document Revised: 11/27/2018 Document Reviewed: 06/05/2017 Elsevier Patient Education  2020 [employer]   AMBULATORY SURGERY  DISCHARGE INSTRUCTIONS   1) The drugs that you were given will stay in your system until tomorrow so for the next 24 hours you should not:  A) Drive an automobile B) Make any legal decisions C) Drink any alcoholic beverage   2) You may resume regular meals tomorrow.  Today it is better to start with liquids and gradually work up to solid foods.  You may eat anything you prefer,   but it is better to start with liquids, then soup and crackers, and gradually work up to solid foods.   3) Please notify your doctor immediately if you have any unusual bleeding, trouble breathing, redness and pain at the surgery site, drainage, fever, or pain not relieved by medication.    4) Additional Instructions:        Please contact your physician with any problems or Same Day Surgery at [phone], Monday through Friday 6 am to 4 pm, or [place] [facility] [place] Main number at [phone]. 

## 2019-09-11 NOTE — Anesthesia Preprocedure Evaluation (Signed)
Anesthesia Evaluation  Patient identified by MRN, date of birth, ID band Patient awake    Reviewed: Allergy & Precautions, H&P , NPO status , reviewed documented beta blocker date and time   Airway Mallampati: II  TM Distance: >3 FB Neck ROM: full    Dental  (+) Missing   Pulmonary neg sleep apnea,  Denies SA, states no longer an issue for him   Pulmonary exam normal        Cardiovascular hypertension, Normal cardiovascular exam     Neuro/Psych    GI/Hepatic GERD  Controlled,  Endo/Other  diabetes  Renal/GU Renal disease     Musculoskeletal  (+) Arthritis ,   Abdominal   Peds  Hematology  (+) Blood dyscrasia, anemia ,   Anesthesia Other Findings Past Medical History: No date: Anemia No date: Arthritis No date: Chronic kidney disease     Comment:  Stage 4 CKD No date: Diabetes mellitus without complication (HCC) No date: GERD (gastroesophageal reflux disease)     Comment:  h/o No date: Gout No date: Hyperlipidemia, mixed No date: Hypertension No date: Sleep apnea     Comment:  h/o no cpap Past Surgical History: No date: APPENDECTOMY No date: Bunion Removal; Right 06/14/2015: COLONOSCOPY WITH PROPOFOL; N/A     Comment:  Procedure: COLONOSCOPY WITH PROPOFOL;  Surgeon: Lollie Sails, MD;  Location: [facility] ENDOSCOPY;  Service:               Endoscopy;  Laterality: N/A; No date: HERNIA REPAIR 03/27/2017: PROSTATE BIOPSY; N/A     Comment:  Procedure: PROSTATE BIOPSY-URO NAV;  Surgeon: Royston Cowper, MD;  Location: [facility] ORS;  Service: Urology;                Laterality: N/A; No date: RETINAL DETACHMENT SURGERY; Left No date: TRANSURETHRAL RESECTION OF PROSTATE BMI    Body Mass Index: 29.84 kg/m     Reproductive/Obstetrics                             Anesthesia Physical Anesthesia Plan  ASA: III  Anesthesia Plan: General and General LMA    Post-op Pain Management:    Induction: Intravenous  PONV Risk Score and Plan: Ondansetron, Treatment may vary due to age or medical condition and TIVA  Airway Management Planned: Nasal Cannula, Natural Airway and LMA  Additional Equipment:   Intra-op Plan:   Post-operative Plan:   Informed Consent: I have reviewed the patients History and Physical, chart, labs and discussed the procedure including the risks, benefits and alternatives for the proposed anesthesia with the patient or authorized representative who has indicated his/her understanding and acceptance.     Dental Advisory Given  Plan Discussed with: CRNA  Anesthesia Plan Comments: (LMA vs [place])        Anesthesia Quick Evaluation

## 2019-09-11 NOTE — Transfer of Care (Signed)
Immediate Anesthesia Transfer of Care Note  Patient: [name] [name]  Procedure(s) Performed: PROSTATE BIOPSY Pearson Forster (N/A Prostate)  Patient Location: PACU  Anesthesia Type:General  Level of Consciousness: awake and drowsy  Airway & Oxygen Therapy: Patient Spontanous Breathing and Patient connected to nasal cannula oxygen  Post-op Assessment: Report given to RN and Post -op Vital signs reviewed and stable  Post vital signs: Reviewed and stable  Last Vitals:  Vitals Value Taken Time  BP 123/67 09/11/19 0916  Temp 35.9 C 09/11/19 0915  Pulse 68 09/11/19 0918  Resp 20 09/11/19 0918  SpO2 100 % 09/11/19 0918  Vitals shown include unvalidated device data.  Last Pain:  Vitals:   09/11/19 0730  TempSrc: Tympanic  PainSc: 0-No pain         Complications: No apparent anesthesia complications

## 2019-09-11 NOTE — H&P (Signed)
Date of Initial H&P: 08/20/19  History reviewed, patient examined, no change in status, stable for surgery.

## 2019-09-11 NOTE — Op Note (Addendum)
Preoperative diagnosis: Elevated PSA  Postoperative diagnosis: Same  Procedure: Image guided fusion Uronav transrectal ultrasound guided biopsy of the prostate  Surgeon: Otelia Limes. Yves Dill MD  Anesthesia: General  Indications:See the history and physical. After informed consent the above procedure(s) were requested     Technique and findings: After adequate general anesthesia been obtained patient was placed into lateral decubitus position.  Finger sweep indicated clear rectal vault.  The ultrasound probe was then placed and images acquired.  The ultrasound images were then fused with the MRI images.  Region of interest #1 was identified and 3 core biopsies taken.  Region of interest #2 was identified and 5 core biopsies taken.  At this point standard 12 core systematic biopsies were performed.  The ultrasound probe was removed.  Blood loss was minimal.  The procedure was then terminated and patient transferred to the recovery room in stable condition.

## 2019-09-11 NOTE — Anesthesia Postprocedure Evaluation (Signed)
Anesthesia Post Note  Patient: Nicholad Kautzman  Procedure(s) Performed: PROSTATE BIOPSY Pearson Forster (N/A Prostate)  Patient location during evaluation: PACU Anesthesia Type: General Level of consciousness: awake and alert Pain management: pain level controlled Vital Signs Assessment: post-procedure vital signs reviewed and stable Respiratory status: spontaneous breathing, nonlabored ventilation and respiratory function stable Cardiovascular status: blood pressure returned to baseline and stable Postop Assessment: no apparent nausea or vomiting Anesthetic complications: no     Last Vitals:  Vitals:   09/11/19 0945 09/11/19 0957  BP: (!) 143/63 (!) 149/87  Pulse: (!) 57 61  Resp: 13 16  Temp: (!) 36.3 C (!) 36.4 C  SpO2: 100% 100%    Last Pain:  Vitals:   09/11/19 0957  TempSrc: Temporal  PainSc: 0-No pain                 Alphonsus Sias

## 2019-09-12 LAB — SURGICAL PATHOLOGY

## 2019-09-22 ENCOUNTER — Inpatient Hospital Stay: Payer: Medicare Other

## 2019-09-22 ENCOUNTER — Inpatient Hospital Stay: Payer: Medicare Other | Attending: Oncology

## 2019-09-29 ENCOUNTER — Other Ambulatory Visit: Payer: Self-pay | Admitting: Urology

## 2019-09-29 DIAGNOSIS — C61 Malignant neoplasm of prostate: Secondary | ICD-10-CM

## 2019-10-07 ENCOUNTER — Ambulatory Visit
Admission: RE | Admit: 2019-10-07 | Discharge: 2019-10-07 | Disposition: A | Discharge status: Home / Self Care | Payer: Medicare Other | Source: Ambulatory Visit | Attending: Urology | Admitting: Urology

## 2019-10-07 ENCOUNTER — Encounter
Admission: RE | Admit: 2019-10-07 | Discharge: 2019-10-07 | Disposition: A | Discharge status: Home / Self Care | Payer: Medicare Other | Source: Ambulatory Visit | Attending: Urology | Admitting: Urology

## 2019-10-07 ENCOUNTER — Other Ambulatory Visit: Payer: Self-pay | Admitting: Urology

## 2019-10-07 ENCOUNTER — Other Ambulatory Visit: Payer: Self-pay

## 2019-10-07 DIAGNOSIS — C61 Malignant neoplasm of prostate: Secondary | ICD-10-CM | POA: Insufficient documentation

## 2019-10-07 MED ORDER — TECHNETIUM TC 99M MEDRONATE IV KIT
20.0000 | PACK | Freq: Once | INTRAVENOUS | Status: AC | PRN
  Administered 2019-10-07: 22.508 via INTRAVENOUS

## 2019-10-20 ENCOUNTER — Inpatient Hospital Stay: Payer: Medicare Other

## 2019-10-20 ENCOUNTER — Inpatient Hospital Stay: Payer: Medicare Other | Attending: Oncology

## 2019-10-20 DIAGNOSIS — N184 Chronic kidney disease, stage 4 (severe): Secondary | ICD-10-CM | POA: Insufficient documentation

## 2019-10-20 DIAGNOSIS — D631 Anemia in chronic kidney disease: Secondary | ICD-10-CM | POA: Insufficient documentation

## 2019-10-21 ENCOUNTER — Other Ambulatory Visit: Payer: Self-pay | Admitting: Urology

## 2019-10-21 DIAGNOSIS — R93429 Abnormal radiologic findings on diagnostic imaging of unspecified kidney: Secondary | ICD-10-CM

## 2019-10-31 ENCOUNTER — Ambulatory Visit
Admission: RE | Admit: 2019-10-31 | Discharge: 2019-10-31 | Disposition: A | Discharge status: Home / Self Care | Payer: Medicare Other | Source: Ambulatory Visit | Attending: Urology | Admitting: Urology

## 2019-10-31 ENCOUNTER — Other Ambulatory Visit: Payer: Self-pay

## 2019-10-31 DIAGNOSIS — R93429 Abnormal radiologic findings on diagnostic imaging of unspecified kidney: Secondary | ICD-10-CM | POA: Diagnosis not present

## 2019-11-17 ENCOUNTER — Inpatient Hospital Stay: Payer: Medicare Other

## 2019-11-17 VITALS — BP 137/65 | HR 57

## 2019-11-17 DIAGNOSIS — N184 Chronic kidney disease, stage 4 (severe): Secondary | ICD-10-CM

## 2019-11-17 DIAGNOSIS — D631 Anemia in chronic kidney disease: Secondary | ICD-10-CM | POA: Diagnosis present

## 2019-11-17 DIAGNOSIS — N183 Chronic kidney disease, stage 3 unspecified: Secondary | ICD-10-CM

## 2019-11-17 LAB — CBC WITH DIFFERENTIAL/PLATELET
Abs Immature Granulocytes: 0.02 10*3/uL (ref 0.00–0.07)
Basophils Absolute: 0 10*3/uL (ref 0.0–0.1)
Basophils Relative: 1 %
Eosinophils Absolute: 0.5 10*3/uL (ref 0.0–0.5)
Eosinophils Relative: 8 %
HCT: 24.6 % — ABNORMAL LOW (ref 39.0–52.0)
Hemoglobin: 7.7 g/dL — ABNORMAL LOW (ref 13.0–17.0)
Immature Granulocytes: 0 %
Lymphocytes Relative: 24 %
Lymphs Abs: 1.4 10*3/uL (ref 0.7–4.0)
MCH: 29.3 pg (ref 26.0–34.0)
MCHC: 31.3 g/dL (ref 30.0–36.0)
MCV: 93.5 fL (ref 80.0–100.0)
Monocytes Absolute: 0.5 10*3/uL (ref 0.1–1.0)
Monocytes Relative: 8 %
Neutro Abs: 3.4 10*3/uL (ref 1.7–7.7)
Neutrophils Relative %: 59 %
Platelets: 103 10*3/uL — ABNORMAL LOW (ref 150–400)
RBC: 2.63 MIL/uL — ABNORMAL LOW (ref 4.22–5.81)
RDW: 14.6 % (ref 11.5–15.5)
WBC: 5.7 10*3/uL (ref 4.0–10.5)
nRBC: 0 % (ref 0.0–0.2)

## 2019-11-17 LAB — IRON AND TIBC
Iron: 64 ug/dL (ref 45–182)
Saturation Ratios: 34 % (ref 17.9–39.5)
TIBC: 190 ug/dL — ABNORMAL LOW (ref 250–450)
UIBC: 126 ug/dL

## 2019-11-17 LAB — FERRITIN: Ferritin: 238 ng/mL (ref 24–336)

## 2019-11-17 MED ORDER — EPOETIN ALFA-EPBX 40000 UNIT/ML IJ SOLN
40000.0000 [IU] | Freq: Once | INTRAMUSCULAR | Status: AC
  Administered 2019-11-17: 40000 [IU] via SUBCUTANEOUS
  Filled 2019-11-17: qty 1

## 2019-11-20 NOTE — Progress Notes (Deleted)
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  INTERVAL HISTORY: Patient returns to clinic today for repeat laboratory work, further evaluation, and continuation of Retacrit.  He continues to feel well and remains asymptomatic.  He denies any weakness or fatigue.  He has no neurologic complaints. He denies any recent fevers or illnesses. He has a good appetite and denies weight loss.  He denies any chest pain, shortness of breath, cough, or hemoptysis.  He denies any nausea, vomiting, constipation, or diarrhea. He denies any melena or hematochezia.  He has no urinary complaints.  Patient feels at his baseline and offers no specific complaints today.  REVIEW OF SYSTEMS:   Review of Systems  Constitutional: Negative.  Negative for fever, malaise/fatigue and weight loss.  Respiratory: Negative.  Negative for cough and shortness of breath.   Cardiovascular: Negative.  Negative for chest pain and leg swelling.  Gastrointestinal: Negative.  Negative for abdominal pain, blood in stool and melena.  Genitourinary: Negative.  Negative for hematuria.  Musculoskeletal: Negative.  Negative for back pain.  Skin: Negative.  Negative for rash.  Neurological: Negative.  Negative for sensory change, focal weakness, weakness and headaches.  Psychiatric/Behavioral: Negative.  The patient is not nervous/anxious.     As per HPI. Otherwise, a complete review of systems is negative.  PAST MEDICAL HISTORY: Past Medical History:  Diagnosis Date  . Anemia   . Arthritis   . Chronic kidney disease    Stage 4 CKD  . Diabetes mellitus without complication ([place])   . GERD  (gastroesophageal reflux disease)    h/o  . Gout   . Hyperlipidemia, mixed   . Hypertension   . Sleep apnea    h/o no cpap    PAST SURGICAL HISTORY: Past Surgical History:  Procedure Laterality Date  . APPENDECTOMY    . Bunion Removal Right   . COLONOSCOPY WITH PROPOFOL N/A 06/14/2015   Procedure: COLONOSCOPY WITH PROPOFOL;  Surgeon: Lollie Sails, MD;  Location: [facility] ENDOSCOPY;  Service: Endoscopy;  Laterality: N/A;  . HERNIA REPAIR    . PROSTATE BIOPSY N/A 03/27/2017   Procedure: PROSTATE BIOPSY-URO NAV;  Surgeon: Royston Cowper, MD;  Location: [facility] ORS;  Service: Urology;  Laterality: N/A;  . PROSTATE BIOPSY N/A 09/11/2019   Procedure: PROSTATE BIOPSY Pearson Forster;  Surgeon: Royston Cowper, MD;  Location: [facility] ORS;  Service: Urology;  Laterality: N/A;  . RETINAL DETACHMENT SURGERY Left   . TRANSURETHRAL RESECTION OF PROSTATE      FAMILY HISTORY: Family History  Problem Relation Age of Onset  . Leukemia Father   . Leukemia Brother     ADVANCED DIRECTIVES (Y/N):  N  HEALTH MAINTENANCE: Social History   Tobacco Use  . Smoking status: Never Smoker  . Smokeless tobacco: Never Used  Substance Use Topics  . Alcohol use: Yes    Comment: occ  . Drug use: No     Colonoscopy:  PAP:  Bone density:  Lipid panel:  Allergies  Allergen Reactions  . Ciprofloxacin   . Doxazosin Other (See Comments)    unknown  . Shellfish Allergy Swelling    Current Outpatient Medications  Medication Sig Dispense Refill  . acetaminophen (TYLENOL) 500 MG tablet Take 1,000 mg  by mouth every 6 (six) hours as needed for moderate pain or headache.     Marland Kitchen amLODipine (NORVASC) 10 MG tablet Take 10 mg by mouth at bedtime.     Marland Kitchen aspirin EC 81 MG tablet Take 81 mg by mouth daily.    . enalapril (VASOTEC) 20 MG tablet Take 20 mg by mouth 2 (two) times daily.     . finasteride (PROSCAR) 5 MG tablet Take 5 mg by mouth at bedtime.     . fluticasone (FLONASE) 50 MCG/ACT nasal spray Place 2  sprays into both nostrils daily as needed for allergies.     Marland Kitchen glimepiride (AMARYL) 4 MG tablet Take 4 mg by mouth daily with breakfast.     . glucose blood (ONE TOUCH ULTRA TEST) test strip Use as directed. Check CBG's fasting daily. Dx: E11.22, N18.4    . glucose blood test strip USE TO CHECK FASTING BLOOD SUGAR ONCE DAILY    . hydrALAZINE (APRESOLINE) 100 MG tablet Take 100 mg by mouth 3 (three) times daily.    . Insulin Pen Needle (NOVOTWIST) 32G X 5 MM MISC Use 1 Units once daily. Dx: E11.9    . loratadine (CLARITIN) 10 MG tablet Take 10 mg by mouth daily as needed for allergies.     Marland Kitchen lovastatin (MEVACOR) 20 MG tablet Take 20 mg by mouth at bedtime.    . metoprolol (LOPRESSOR) 50 MG tablet Take 50 mg by mouth 2 (two) times daily.    . sodium bicarbonate 650 MG tablet Take 650 mg by mouth 2 (two) times daily.     Marland Kitchen sulfamethoxazole-trimethoprim (BACTRIM DS) 800-160 MG tablet Take 1 tablet by mouth 2 (two) times daily. 14 tablet 0  . trolamine salicylate (ASPERCREME) 10 % cream Apply 1 application topically as needed for muscle pain.    Marland Kitchen ULORIC 40 MG tablet Take 40 mg by mouth daily.      No current facility-administered medications for this visit.    OBJECTIVE: There were no vitals filed for this visit.   There is no height or weight on file to calculate BMI.    ECOG FS:0 - Asymptomatic  General: Well-developed, well-nourished, no acute distress. Eyes: Pink conjunctiva, anicteric sclera. HEENT: Normocephalic, moist mucous membranes. Lungs: Clear to auscultation bilaterally. Heart: Regular rate and rhythm. No rubs, murmurs, or gallops. Abdomen: Soft, nontender, nondistended. No organomegaly noted, normoactive bowel sounds. Musculoskeletal: No edema, cyanosis, or clubbing. Neuro: Alert, answering all questions appropriately. Cranial nerves grossly intact. Skin: No rashes or petechiae noted. Psych: Normal affect.  LAB RESULTS:  Lab Results  Component Value Date   NA 140  06/17/2018   K 4.2 06/17/2018   CL 112 (H) 06/17/2018   CO2 21 (L) 06/17/2018   GLUCOSE 186 (H) 06/17/2018   BUN 76 (H) 06/17/2018   CREATININE 4.10 (H) 08/06/2019   CALCIUM 11.3 (H) 06/17/2018   PROT 7.3 10/14/2012   ALBUMIN 2.4 (L) 10/14/2012   AST 15 10/14/2012   ALT 15 10/14/2012   ALKPHOS 84 10/14/2012   BILITOT 0.3 10/14/2012   GFRNONAA 9 (L) 06/17/2018   GFRAA 10 (L) 06/17/2018    Lab Results  Component Value Date   WBC 5.7 11/17/2019   NEUTROABS 3.4 11/17/2019   HGB 7.7 (L) 11/17/2019   HCT 24.6 (L) 11/17/2019   MCV 93.5 11/17/2019   PLT 103 (L) 11/17/2019   Lab Results  Component Value Date   IRON 64 11/17/2019   TIBC 190 (L) 11/17/2019   IRONPCTSAT  34 11/17/2019   Lab Results  Component Value Date   FERRITIN 238 11/17/2019     STUDIES: MR ABDOMEN WO CONTRAST  Result Date: 10/31/2019 CLINICAL DATA:  Indeterminate left renal lesion on recent noncontrast CT. Chronic kidney disease, stage IV. EXAM: MRI ABDOMEN WITHOUT CONTRAST TECHNIQUE: Multiplanar multisequence MR imaging was performed without the administration of intravenous contrast. COMPARISON:  None. FINDINGS: Lower chest: No acute findings. Hepatobiliary: No hepatic masses in visualized portion of liver. A tiny sub-cm benign fluid attenuation cyst is seen in the posterior right hepatic lobe. Gallbladder sludge is noted, however there is no evidence of cholecystitis or biliary ductal dilatation. Pancreas:  No mass or inflammatory changes. Spleen:  Within normal limits in size and appearance. Adrenals/Urinary Tract: Normal adrenal glands. Ectopic location of the right kidney is seen in the right pelvis. Multiple small simple appearing cysts are seen within the right kidney. A tiny subcapsular T1 hyperintense hemorrhagic cyst is also noted in the upper pole the right kidney. No evidence of right renal mass or hydronephrosis. The left kidney is normal in position and multiple small simple appearing cysts are seen.  A few T1 hyperintense hemorrhagic cysts are also noted. A lesion is seen in the anterior midpole of the left kidney which measures 3.4 x 2.7 cm and shows heterogeneous T1 hyperintensity and T2 hypointensity. Given T1 hyperintensity, this likely represents a complex hemorrhagic cyst, although a papillary renal cell carcinoma cannot be excluded. No evidence of hydronephrosis. Stomach/Bowel: Visualized portion unremarkable. Vascular/Lymphatic: No pathologically enlarged lymph nodes identified. No abdominal aortic aneurysm. Other: A moderate midline epigastric ventral hernia is seen containing only omental fat. No evidence of herniated bowel loops. Musculoskeletal:  No suspicious bone lesions identified. IMPRESSION: 1. Multiple small benign bilateral renal cysts. 2. 3.4 cm lesion in midpole of left kidney, suspicious for complex hemorrhagic cyst, although a solid mass such as a papillary renal cell carcinoma cannot be excluded on this unenhanced exam. Recommend continued followup with renal protocol abdomen MRI in 6 months. 3. Right pelvic kidney. 4. Moderate midline epigastric ventral hernia containing only omental fat. No evidence of herniated bowel loops. Electronically Signed   By: Marlaine Hind M.D.   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  PLAN:    1. 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  Iron stores continue to be within normal limits.  He does not require additional Feraheme today.  Proceed with 40,000 units subcutaneous Retacrit.  Return to clinic every 4 weeks for laboratory work and Retacrit if his hemoglobin remains below 10.0.  Patient will then return to clinic in 4 months for repeat laboratory work and further evaluation.   2. Thrombocytopenia: Chronic and unchanged.  No intervention is needed.  Patient does not require bone marrow biopsy.  Follow-up as above.   3. Chronic renal  failure: Chronic and unchanged.  Continue close follow-up with nephrology.  He has not initiated dialysis yet. 4. Hypertension: Patient's blood pressure is mildly elevated.  Proceed with treatment as above.  Patient expressed understanding and was in agreement with this plan. He also understands that He can call clinic at any time with any questions, concerns, or complaints.   Lloyd Huger, MD   11/20/2019 1:32 PM

## 2019-11-26 ENCOUNTER — Inpatient Hospital Stay: Payer: Medicare Other

## 2019-11-26 ENCOUNTER — Inpatient Hospital Stay: Payer: Medicare Other | Admitting: Oncology

## 2019-12-12 ENCOUNTER — Encounter: Payer: Self-pay | Admitting: Oncology

## 2019-12-12 ENCOUNTER — Other Ambulatory Visit: Payer: Self-pay

## 2019-12-12 NOTE — Progress Notes (Signed)
Patient states he is not having any pain or concerns for his pre assessment.

## 2019-12-13 NOTE — Progress Notes (Signed)
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  INTERVAL HISTORY: Patient returns to clinic today for further evaluation, repeat laboratory work, and continuation of Retacrit.  He continues to feel well and remains asymptomatic.  He does not complain of any weakness or fatigue.  He has chronic joint pain from arthritis and occasional constipation.  He has no neurologic complaints. He denies any recent fevers or illnesses. He has a good appetite and denies weight loss.  He denies any chest pain, shortness of breath, cough, or hemoptysis.  He denies any nausea, vomiting, or diarrhea. He denies any melena or hematochezia.  He has no urinary complaints.  Patient offers no further specific complaints today.  REVIEW OF SYSTEMS:   Review of Systems  Constitutional: Negative.  Negative for fever, malaise/fatigue and weight loss.  Respiratory: Negative.  Negative for cough and shortness of breath.   Cardiovascular: Negative.  Negative for chest pain and leg swelling.  Gastrointestinal: Positive for constipation. Negative for abdominal pain, blood in stool and melena.  Genitourinary: Negative.  Negative for hematuria.  Musculoskeletal: Positive for joint pain. Negative for back pain.  Skin: Negative.  Negative for rash.  Neurological: Negative.  Negative for sensory change, focal weakness, weakness and headaches.  Psychiatric/Behavioral: Negative.  The patient is not nervous/anxious.     As per HPI. Otherwise, a complete review of systems is negative.  PAST MEDICAL HISTORY: Past Medical History:  Diagnosis Date  . Anemia   . Arthritis   . Chronic kidney disease     Stage 4 CKD  . Diabetes mellitus without complication ([place])   . GERD (gastroesophageal reflux disease)    h/o  . Gout   . Hyperlipidemia, mixed   . Hypertension   . Sleep apnea    h/o no cpap    PAST SURGICAL HISTORY: Past Surgical History:  Procedure Laterality Date  . APPENDECTOMY    . Bunion Removal Right   . COLONOSCOPY WITH PROPOFOL N/A 06/14/2015   Procedure: COLONOSCOPY WITH PROPOFOL;  Surgeon: Lollie Sails, MD;  Location: [facility] ENDOSCOPY;  Service: Endoscopy;  Laterality: N/A;  . HERNIA REPAIR    . PROSTATE BIOPSY N/A 03/27/2017   Procedure: PROSTATE BIOPSY-URO NAV;  Surgeon: Royston Cowper, MD;  Location: [facility] ORS;  Service: Urology;  Laterality: N/A;  . PROSTATE BIOPSY N/A 09/11/2019   Procedure: PROSTATE BIOPSY Pearson Forster;  Surgeon: Royston Cowper, MD;  Location: [facility] ORS;  Service: Urology;  Laterality: N/A;  . RETINAL DETACHMENT SURGERY Left   . TRANSURETHRAL RESECTION OF PROSTATE      FAMILY HISTORY: Family History  Problem Relation Age of Onset  . Leukemia Father   . Leukemia Brother     ADVANCED DIRECTIVES (Y/N):  N  HEALTH MAINTENANCE: Social History   Tobacco Use  . Smoking status: Never Smoker  . Smokeless tobacco: Never Used  Substance Use Topics  . Alcohol use: Yes    Comment: occ  . Drug use: No     Colonoscopy:  PAP:  Bone density:  Lipid panel:  Allergies  Allergen Reactions  . Ciprofloxacin   . Doxazosin Other (See Comments)    unknown  . Shellfish Allergy Swelling    Current Outpatient Medications  Medication Sig  Dispense Refill  . acetaminophen (TYLENOL) 500 MG tablet Take 1,000 mg by mouth every 6 (six) hours as needed for moderate pain or headache.     Marland Kitchen amLODipine (NORVASC) 10 MG tablet Take 10 mg by mouth at bedtime.     Marland Kitchen aspirin EC 81 MG tablet Take 81 mg by mouth daily.    . bicalutamide (CASODEX) 50 MG tablet Take 50 mg by mouth daily.    . enalapril (VASOTEC) 20 MG tablet Take 20 mg by mouth 2 (two) times  daily.     . finasteride (PROSCAR) 5 MG tablet Take 5 mg by mouth at bedtime.     . fluticasone (FLONASE) 50 MCG/ACT nasal spray Place 2 sprays into both nostrils daily as needed for allergies.     Marland Kitchen glimepiride (AMARYL) 4 MG tablet Take 4 mg by mouth daily with breakfast.     . glucose blood (ONE TOUCH ULTRA TEST) test strip Use as directed. Check CBG's fasting daily. Dx: E11.22, N18.4    . glucose blood test strip USE TO CHECK FASTING BLOOD SUGAR ONCE DAILY    . hydrALAZINE (APRESOLINE) 100 MG tablet Take 100 mg by mouth 3 (three) times daily.    . Insulin Pen Needle (NOVOTWIST) 32G X 5 MM MISC Use 1 Units once daily. Dx: E11.9    . loratadine (CLARITIN) 10 MG tablet Take 10 mg by mouth daily as needed for allergies.     Marland Kitchen lovastatin (MEVACOR) 20 MG tablet Take 20 mg by mouth at bedtime.    Marland Kitchen LUPRON DEPOT, 58-MONTH, 45 MG injection Inject 45 mg into the muscle every 6 (six) months.    . metoprolol (LOPRESSOR) 50 MG tablet Take 50 mg by mouth 2 (two) times daily.    . sodium bicarbonate 650 MG tablet Take 650 mg by mouth 2 (two) times daily.     Marland Kitchen sulfamethoxazole-trimethoprim (BACTRIM DS) 800-160 MG tablet Take 1 tablet by mouth 2 (two) times daily. 14 tablet 0  . trolamine salicylate (ASPERCREME) 10 % cream Apply 1 application topically as needed for muscle pain.    Marland Kitchen ULORIC 40 MG tablet Take 40 mg by mouth daily.      No current facility-administered medications for this visit.    OBJECTIVE: Vitals:   12/15/19 1011  BP: (!) 154/69  Pulse: 60  Resp: 20  Temp: (!) 96 F (35.6 C)  SpO2: 100%     Body mass index is 29.74 kg/m.    ECOG FS:0 - Asymptomatic  General: Well-developed, well-nourished, no acute distress. Eyes: Pink conjunctiva, anicteric sclera. HEENT: Normocephalic, moist mucous membranes. Lungs: No audible wheezing or coughing. Heart: Regular rate and rhythm. Abdomen: Soft, nontender, no obvious distention. Musculoskeletal: No edema, cyanosis, or clubbing. Neuro:  Alert, answering all questions appropriately. Cranial nerves grossly intact. Skin: No rashes or petechiae noted. Psych: Normal affect.  LAB RESULTS:  Lab Results  Component Value Date   NA 140 06/17/2018   K 4.2 06/17/2018   CL 112 (H) 06/17/2018   CO2 21 (L) 06/17/2018   GLUCOSE 186 (H) 06/17/2018   BUN 76 (H) 06/17/2018   CREATININE 4.10 (H) 08/06/2019   CALCIUM 11.3 (H) 06/17/2018   PROT 7.3 10/14/2012   ALBUMIN 2.4 (L) 10/14/2012   AST 15 10/14/2012   ALT 15 10/14/2012   ALKPHOS 84 10/14/2012   BILITOT 0.3 10/14/2012   GFRNONAA 9 (L) 06/17/2018   GFRAA 10 (L) 06/17/2018    Lab Results  Component Value Date  WBC 6.6 12/15/2019   NEUTROABS 3.9 12/15/2019   HGB 8.5 (L) 12/15/2019   HCT 26.4 (L) 12/15/2019   MCV 96.0 12/15/2019   PLT 133 (L) 12/15/2019   Lab Results  Component Value Date   IRON 64 11/17/2019   TIBC 190 (L) 11/17/2019   IRONPCTSAT 34 11/17/2019   Lab Results  Component Value Date   FERRITIN 238 11/17/2019     STUDIES: No results found.  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  PLAN:    1. 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  Iron stores continue to be within normal limits today.  Proceed with 40,000 units subcutaneous Retacrit.  Return to clinic every 4 weeks for laboratory work and Retacrit if his hemoglobin remains below 10.0.  Patient would then return to clinic in 4 months for further evaluation and continuation of treatment.   2. Thrombocytopenia: Mildly improved.  Patient platelet count is 133 today. 3. Chronic renal failure: Chronic and unchanged.  Continue close follow-up with nephrology.  He has not initiated dialysis yet. 4. Hypertension: Chronic and unchanged.  Proceed with treatment as above. 5.  Constipation: Recommended OTC stool softeners.   Patient expressed understanding and was in agreement with this plan. He  also understands that He can call clinic at any time with any questions, concerns, or complaints.   Lloyd Huger, MD   12/15/2019 10:49 AM

## 2019-12-15 ENCOUNTER — Other Ambulatory Visit: Payer: Self-pay

## 2019-12-15 ENCOUNTER — Encounter: Payer: Self-pay | Admitting: Oncology

## 2019-12-15 ENCOUNTER — Inpatient Hospital Stay: Payer: Medicare Other | Attending: Oncology | Admitting: Oncology

## 2019-12-15 ENCOUNTER — Inpatient Hospital Stay: Payer: Medicare Other

## 2019-12-15 VITALS — BP 154/69 | HR 60 | Temp 96.0°F | Resp 20 | Wt 219.3 lb

## 2019-12-15 DIAGNOSIS — N183 Chronic kidney disease, stage 3 unspecified: Secondary | ICD-10-CM | POA: Diagnosis not present

## 2019-12-15 DIAGNOSIS — D696 Thrombocytopenia, unspecified: Secondary | ICD-10-CM | POA: Diagnosis not present

## 2019-12-15 DIAGNOSIS — N184 Chronic kidney disease, stage 4 (severe): Secondary | ICD-10-CM | POA: Diagnosis not present

## 2019-12-15 DIAGNOSIS — D631 Anemia in chronic kidney disease: Secondary | ICD-10-CM | POA: Diagnosis not present

## 2019-12-15 LAB — FERRITIN: Ferritin: 203 ng/mL (ref 24–336)

## 2019-12-15 LAB — CBC WITH DIFFERENTIAL/PLATELET
Abs Immature Granulocytes: 0.02 10*3/uL (ref 0.00–0.07)
Basophils Absolute: 0.1 10*3/uL (ref 0.0–0.1)
Basophils Relative: 1 %
Eosinophils Absolute: 0.5 10*3/uL (ref 0.0–0.5)
Eosinophils Relative: 8 %
HCT: 26.4 % — ABNORMAL LOW (ref 39.0–52.0)
Hemoglobin: 8.5 g/dL — ABNORMAL LOW (ref 13.0–17.0)
Immature Granulocytes: 0 %
Lymphocytes Relative: 23 %
Lymphs Abs: 1.5 10*3/uL (ref 0.7–4.0)
MCH: 30.9 pg (ref 26.0–34.0)
MCHC: 32.2 g/dL (ref 30.0–36.0)
MCV: 96 fL (ref 80.0–100.0)
Monocytes Absolute: 0.6 10*3/uL (ref 0.1–1.0)
Monocytes Relative: 8 %
Neutro Abs: 3.9 10*3/uL (ref 1.7–7.7)
Neutrophils Relative %: 60 %
Platelets: 133 10*3/uL — ABNORMAL LOW (ref 150–400)
RBC: 2.75 MIL/uL — ABNORMAL LOW (ref 4.22–5.81)
RDW: 14.9 % (ref 11.5–15.5)
WBC: 6.6 10*3/uL (ref 4.0–10.5)
nRBC: 0 % (ref 0.0–0.2)

## 2019-12-15 LAB — IRON AND TIBC
Iron: 71 ug/dL (ref 45–182)
Saturation Ratios: 34 % (ref 17.9–39.5)
TIBC: 210 ug/dL — ABNORMAL LOW (ref 250–450)
UIBC: 139 ug/dL

## 2019-12-15 MED ORDER — EPOETIN ALFA-EPBX 40000 UNIT/ML IJ SOLN
40000.0000 [IU] | Freq: Once | INTRAMUSCULAR | Status: AC
  Administered 2019-12-15: 40000 [IU] via SUBCUTANEOUS
  Filled 2019-12-15: qty 1

## 2020-01-12 ENCOUNTER — Inpatient Hospital Stay: Payer: Medicare Other

## 2020-01-12 ENCOUNTER — Other Ambulatory Visit: Payer: Self-pay

## 2020-01-12 ENCOUNTER — Inpatient Hospital Stay: Payer: Medicare Other | Attending: Oncology

## 2020-01-12 VITALS — BP 147/56 | HR 62

## 2020-01-12 DIAGNOSIS — N184 Chronic kidney disease, stage 4 (severe): Secondary | ICD-10-CM | POA: Diagnosis present

## 2020-01-12 DIAGNOSIS — D631 Anemia in chronic kidney disease: Secondary | ICD-10-CM | POA: Insufficient documentation

## 2020-01-12 DIAGNOSIS — N183 Chronic kidney disease, stage 3 unspecified: Secondary | ICD-10-CM

## 2020-01-12 LAB — CBC WITH DIFFERENTIAL/PLATELET
Abs Immature Granulocytes: 0.01 10*3/uL (ref 0.00–0.07)
Basophils Absolute: 0 10*3/uL (ref 0.0–0.1)
Basophils Relative: 1 %
Eosinophils Absolute: 0.5 10*3/uL (ref 0.0–0.5)
Eosinophils Relative: 8 %
HCT: 25.7 % — ABNORMAL LOW (ref 39.0–52.0)
Hemoglobin: 8.2 g/dL — ABNORMAL LOW (ref 13.0–17.0)
Immature Granulocytes: 0 %
Lymphocytes Relative: 27 %
Lymphs Abs: 1.6 10*3/uL (ref 0.7–4.0)
MCH: 31.3 pg (ref 26.0–34.0)
MCHC: 31.9 g/dL (ref 30.0–36.0)
MCV: 98.1 fL (ref 80.0–100.0)
Monocytes Absolute: 0.4 10*3/uL (ref 0.1–1.0)
Monocytes Relative: 7 %
Neutro Abs: 3.4 10*3/uL (ref 1.7–7.7)
Neutrophils Relative %: 57 %
Platelets: 143 10*3/uL — ABNORMAL LOW (ref 150–400)
RBC: 2.62 MIL/uL — ABNORMAL LOW (ref 4.22–5.81)
RDW: 14.6 % (ref 11.5–15.5)
WBC: 5.9 10*3/uL (ref 4.0–10.5)
nRBC: 0 % (ref 0.0–0.2)

## 2020-01-12 LAB — IRON AND TIBC
Iron: 50 ug/dL (ref 45–182)
Saturation Ratios: 25 % (ref 17.9–39.5)
TIBC: 204 ug/dL — ABNORMAL LOW (ref 250–450)
UIBC: 154 ug/dL

## 2020-01-12 LAB — FERRITIN: Ferritin: 159 ng/mL (ref 24–336)

## 2020-01-12 MED ORDER — EPOETIN ALFA-EPBX 40000 UNIT/ML IJ SOLN
40000.0000 [IU] | Freq: Once | INTRAMUSCULAR | Status: AC
  Administered 2020-01-12: 40000 [IU] via SUBCUTANEOUS
  Filled 2020-01-12: qty 1

## 2020-02-09 ENCOUNTER — Inpatient Hospital Stay: Payer: Medicare Other | Attending: Oncology

## 2020-02-09 ENCOUNTER — Inpatient Hospital Stay: Payer: Medicare Other

## 2020-02-09 ENCOUNTER — Other Ambulatory Visit: Payer: Self-pay

## 2020-02-09 VITALS — BP 143/62 | HR 64

## 2020-02-09 DIAGNOSIS — N184 Chronic kidney disease, stage 4 (severe): Secondary | ICD-10-CM | POA: Insufficient documentation

## 2020-02-09 DIAGNOSIS — D631 Anemia in chronic kidney disease: Secondary | ICD-10-CM | POA: Diagnosis present

## 2020-02-09 LAB — CBC WITH DIFFERENTIAL/PLATELET
Abs Immature Granulocytes: 0.01 10*3/uL (ref 0.00–0.07)
Basophils Absolute: 0 10*3/uL (ref 0.0–0.1)
Basophils Relative: 1 %
Eosinophils Absolute: 0.4 10*3/uL (ref 0.0–0.5)
Eosinophils Relative: 6 %
HCT: 27.2 % — ABNORMAL LOW (ref 39.0–52.0)
Hemoglobin: 8.6 g/dL — ABNORMAL LOW (ref 13.0–17.0)
Immature Granulocytes: 0 %
Lymphocytes Relative: 25 %
Lymphs Abs: 1.5 10*3/uL (ref 0.7–4.0)
MCH: 30.6 pg (ref 26.0–34.0)
MCHC: 31.6 g/dL (ref 30.0–36.0)
MCV: 96.8 fL (ref 80.0–100.0)
Monocytes Absolute: 0.5 10*3/uL (ref 0.1–1.0)
Monocytes Relative: 9 %
Neutro Abs: 3.6 10*3/uL (ref 1.7–7.7)
Neutrophils Relative %: 59 %
Platelets: 130 10*3/uL — ABNORMAL LOW (ref 150–400)
RBC: 2.81 MIL/uL — ABNORMAL LOW (ref 4.22–5.81)
RDW: 14.1 % (ref 11.5–15.5)
WBC: 6 10*3/uL (ref 4.0–10.5)
nRBC: 0 % (ref 0.0–0.2)

## 2020-02-09 LAB — FERRITIN: Ferritin: 139 ng/mL (ref 24–336)

## 2020-02-09 LAB — IRON AND TIBC
Iron: 57 ug/dL (ref 45–182)
Saturation Ratios: 28 % (ref 17.9–39.5)
TIBC: 206 ug/dL — ABNORMAL LOW (ref 250–450)
UIBC: 149 ug/dL

## 2020-02-09 MED ORDER — EPOETIN ALFA-EPBX 40000 UNIT/ML IJ SOLN
40000.0000 [IU] | Freq: Once | INTRAMUSCULAR | Status: AC
  Administered 2020-02-09: 40000 [IU] via SUBCUTANEOUS
  Filled 2020-02-09: qty 1

## 2020-03-08 ENCOUNTER — Inpatient Hospital Stay: Payer: Medicare Other | Attending: Oncology

## 2020-03-08 ENCOUNTER — Inpatient Hospital Stay: Payer: Medicare Other

## 2020-04-02 NOTE — Progress Notes (Signed)
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  INTERVAL HISTORY: Patient returns to clinic today for repeat laboratory, further evaluation, and continuation of Retacrit.  He continues to have chronic joint pain from his arthritis, but otherwise feels well.  He denies any weakness or fatigue.  He has no neurologic complaints. He denies any recent fevers or illnesses. He has a good appetite and denies weight loss.  He denies any chest pain, shortness of breath, cough, or hemoptysis.  He denies any nausea, vomiting, or diarrhea. He denies any melena or hematochezia.  He has no urinary complaints.  Patient offers no further specific complaints today.  REVIEW OF SYSTEMS:   Review of Systems  Constitutional: Negative.  Negative for fever, malaise/fatigue and weight loss.  Respiratory: Negative.  Negative for cough and shortness of breath.   Cardiovascular: Negative.  Negative for chest pain and leg swelling.  Gastrointestinal: Negative.  Negative for abdominal pain, blood in stool, constipation and melena.  Genitourinary: Negative.  Negative for hematuria.  Musculoskeletal: Positive for joint pain. Negative for back pain.  Skin: Negative.  Negative for rash.  Neurological: Negative.  Negative for sensory change, focal weakness, weakness and headaches.  Psychiatric/Behavioral: Negative.  The patient is not nervous/anxious.     As per HPI. Otherwise, a complete review of systems is negative.  PAST MEDICAL HISTORY: Past Medical History:  Diagnosis Date  . Anemia   . Arthritis   . Chronic kidney disease    Stage 4 CKD  . Diabetes mellitus without complication ([place])    . GERD (gastroesophageal reflux disease)    h/o  . Gout   . Hyperlipidemia, mixed   . Hypertension   . Sleep apnea    h/o no cpap    PAST SURGICAL HISTORY: Past Surgical History:  Procedure Laterality Date  . APPENDECTOMY    . Bunion Removal Right   . COLONOSCOPY WITH PROPOFOL N/A 06/14/2015   Procedure: COLONOSCOPY WITH PROPOFOL;  Surgeon: Lollie Sails, MD;  Location: [facility] ENDOSCOPY;  Service: Endoscopy;  Laterality: N/A;  . HERNIA REPAIR    . PROSTATE BIOPSY N/A 03/27/2017   Procedure: PROSTATE BIOPSY-URO NAV;  Surgeon: Royston Cowper, MD;  Location: [facility] ORS;  Service: Urology;  Laterality: N/A;  . PROSTATE BIOPSY N/A 09/11/2019   Procedure: PROSTATE BIOPSY Pearson Forster;  Surgeon: Royston Cowper, MD;  Location: [facility] ORS;  Service: Urology;  Laterality: N/A;  . RETINAL DETACHMENT SURGERY Left   . TRANSURETHRAL RESECTION OF PROSTATE      FAMILY HISTORY: Family History  Problem Relation Age of Onset  . Leukemia Father   . Leukemia Brother     ADVANCED DIRECTIVES (Y/N):  N  HEALTH MAINTENANCE: Social History   Tobacco Use  . Smoking status: Never Smoker  . Smokeless tobacco: Never Used  Vaping Use  . Vaping Use: Never used  Substance Use Topics  . Alcohol use: Yes    Comment: occ  . Drug use: No     Colonoscopy:  PAP:  Bone density:  Lipid panel:  Allergies  Allergen Reactions  . Ciprofloxacin   . Doxazosin Other (See Comments)    unknown  . Shellfish Allergy Swelling    Current Outpatient Medications  Medication Sig  Dispense Refill  . acetaminophen (TYLENOL) 500 MG tablet Take 1,000 mg by mouth every 6 (six) hours as needed for moderate pain or headache.     Marland Kitchen amLODipine (NORVASC) 10 MG tablet Take 10 mg by mouth at bedtime.     Marland Kitchen aspirin EC 81 MG tablet Take 81 mg by mouth daily.    . bicalutamide (CASODEX) 50 MG tablet Take 50 mg by mouth daily.    . enalapril (VASOTEC) 20 MG tablet Take 20 mg by mouth 2 (two) times daily.     .  finasteride (PROSCAR) 5 MG tablet Take 5 mg by mouth at bedtime.     . fluticasone (FLONASE) 50 MCG/ACT nasal spray Place 2 sprays into both nostrils daily as needed for allergies.     Marland Kitchen glimepiride (AMARYL) 4 MG tablet Take 4 mg by mouth daily with breakfast.     . glucose blood (ONE TOUCH ULTRA TEST) test strip Use as directed. Check CBG's fasting daily. Dx: E11.22, N18.4    . glucose blood test strip USE TO CHECK FASTING BLOOD SUGAR ONCE DAILY    . hydrALAZINE (APRESOLINE) 100 MG tablet Take 100 mg by mouth 3 (three) times daily.    . Insulin Pen Needle (NOVOTWIST) 32G X 5 MM MISC Use 1 Units once daily. Dx: E11.9    . loratadine (CLARITIN) 10 MG tablet Take 10 mg by mouth daily as needed for allergies.     Marland Kitchen lovastatin (MEVACOR) 20 MG tablet Take 20 mg by mouth at bedtime.    Marland Kitchen LUPRON DEPOT, 73-MONTH, 45 MG injection Inject 45 mg into the muscle every 6 (six) months.    . metoprolol (LOPRESSOR) 50 MG tablet Take 50 mg by mouth 2 (two) times daily.    . sodium bicarbonate 650 MG tablet Take 650 mg by mouth 2 (two) times daily.     Marland Kitchen sulfamethoxazole-trimethoprim (BACTRIM DS) 800-160 MG tablet Take 1 tablet by mouth 2 (two) times daily. 14 tablet 0  . trolamine salicylate (ASPERCREME) 10 % cream Apply 1 application topically as needed for muscle pain.    Marland Kitchen ULORIC 40 MG tablet Take 40 mg by mouth daily.      No current facility-administered medications for this visit.    OBJECTIVE: Vitals:   04/05/20 1041  BP: (!) 135/59  Pulse: (!) 52  Resp: 20  Temp: (!) 97.5 F (36.4 C)  SpO2: 100%     Body mass index is 29 kg/m.    ECOG FS:0 - Asymptomatic  General: Well-developed, well-nourished, no acute distress. Eyes: Pink conjunctiva, anicteric sclera. HEENT: Normocephalic, moist mucous membranes. Lungs: No audible wheezing or coughing. Heart: Regular rate and rhythm. Abdomen: Soft, nontender, no obvious distention. Musculoskeletal: No edema, cyanosis, or clubbing. Neuro: Alert,  answering all questions appropriately. Cranial nerves grossly intact. Skin: No rashes or petechiae noted. Psych: Normal affect.  LAB RESULTS:  Lab Results  Component Value Date   NA 140 06/17/2018   K 4.2 06/17/2018   CL 112 (H) 06/17/2018   CO2 21 (L) 06/17/2018   GLUCOSE 186 (H) 06/17/2018   BUN 76 (H) 06/17/2018   CREATININE 4.10 (H) 08/06/2019   CALCIUM 11.3 (H) 06/17/2018   PROT 7.3 10/14/2012   ALBUMIN 2.4 (L) 10/14/2012   AST 15 10/14/2012   ALT 15 10/14/2012   ALKPHOS 84 10/14/2012   BILITOT 0.3 10/14/2012   GFRNONAA 9 (L) 06/17/2018   GFRAA 10 (L) 06/17/2018    Lab Results  Component Value Date  WBC 6.5 04/05/2020   NEUTROABS 3.7 04/05/2020   HGB 8.1 (L) 04/05/2020   HCT 25.0 (L) 04/05/2020   MCV 91.6 04/05/2020   PLT 119 (L) 04/05/2020   Lab Results  Component Value Date   IRON 65 04/05/2020   TIBC 227 (L) 04/05/2020   IRONPCTSAT 29 04/05/2020   Lab Results  Component Value Date   FERRITIN 212 04/05/2020     STUDIES: No results found.  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  PLAN:    1. 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  Iron stores continue to be within normal limits.  Previously it was noted when patient missed his Retacrit injection his hemoglobin will drop below 8.0.  Proceed with 40,000 units subcutaneous Retacrit.  Return to clinic for every 4 weeks for laboratory work and Retacrit.  Patient will then return to clinic in 4 months for repeat laboratory work, further evaluation, and continuation of treatment. 2. Thrombocytopenia: Chronic and unchanged.  Patient's platelet count is 119. 3. Chronic renal failure: Chronic and unchanged.  Continue close follow-up with nephrology.  He has not initiated dialysis yet. 4. Hypertension: Mildly improved.  Proceed with Retacrit as above. 5.  Constipation: Patient does not complain of this  today.  Continue OTC stool softeners as needed. 6.  Joint pain: Continue follow-up with primary care as indicated.   Patient expressed understanding and was in agreement with this plan. He also understands that He can call clinic at any time with any questions, concerns, or complaints.   Lloyd Huger, MD   04/05/2020 12:36 PM

## 2020-04-05 ENCOUNTER — Inpatient Hospital Stay: Payer: Medicare Other | Attending: Oncology

## 2020-04-05 ENCOUNTER — Encounter: Payer: Self-pay | Admitting: Oncology

## 2020-04-05 ENCOUNTER — Inpatient Hospital Stay (HOSPITAL_BASED_OUTPATIENT_CLINIC_OR_DEPARTMENT_OTHER): Payer: Medicare Other | Admitting: Oncology

## 2020-04-05 ENCOUNTER — Inpatient Hospital Stay: Payer: Medicare Other

## 2020-04-05 VITALS — BP 135/59 | HR 52 | Temp 97.5°F | Resp 20 | Wt 213.8 lb

## 2020-04-05 DIAGNOSIS — D631 Anemia in chronic kidney disease: Secondary | ICD-10-CM | POA: Insufficient documentation

## 2020-04-05 DIAGNOSIS — N184 Chronic kidney disease, stage 4 (severe): Secondary | ICD-10-CM | POA: Diagnosis not present

## 2020-04-05 DIAGNOSIS — N183 Chronic kidney disease, stage 3 unspecified: Secondary | ICD-10-CM

## 2020-04-05 LAB — CBC WITH DIFFERENTIAL/PLATELET
Abs Immature Granulocytes: 0.02 10*3/uL (ref 0.00–0.07)
Basophils Absolute: 0 10*3/uL (ref 0.0–0.1)
Basophils Relative: 1 %
Eosinophils Absolute: 0.5 10*3/uL (ref 0.0–0.5)
Eosinophils Relative: 7 %
HCT: 25 % — ABNORMAL LOW (ref 39.0–52.0)
Hemoglobin: 8.1 g/dL — ABNORMAL LOW (ref 13.0–17.0)
Immature Granulocytes: 0 %
Lymphocytes Relative: 27 %
Lymphs Abs: 1.7 10*3/uL (ref 0.7–4.0)
MCH: 29.7 pg (ref 26.0–34.0)
MCHC: 32.4 g/dL (ref 30.0–36.0)
MCV: 91.6 fL (ref 80.0–100.0)
Monocytes Absolute: 0.5 10*3/uL (ref 0.1–1.0)
Monocytes Relative: 8 %
Neutro Abs: 3.7 10*3/uL (ref 1.7–7.7)
Neutrophils Relative %: 57 %
Platelets: 119 10*3/uL — ABNORMAL LOW (ref 150–400)
RBC: 2.73 MIL/uL — ABNORMAL LOW (ref 4.22–5.81)
RDW: 14.3 % (ref 11.5–15.5)
WBC: 6.5 10*3/uL (ref 4.0–10.5)
nRBC: 0 % (ref 0.0–0.2)

## 2020-04-05 LAB — IRON AND TIBC
Iron: 65 ug/dL (ref 45–182)
Saturation Ratios: 29 % (ref 17.9–39.5)
TIBC: 227 ug/dL — ABNORMAL LOW (ref 250–450)
UIBC: 162 ug/dL

## 2020-04-05 LAB — FERRITIN: Ferritin: 212 ng/mL (ref 24–336)

## 2020-04-05 MED ORDER — EPOETIN ALFA-EPBX 40000 UNIT/ML IJ SOLN
40000.0000 [IU] | Freq: Once | INTRAMUSCULAR | Status: AC
  Administered 2020-04-05: 40000 [IU] via SUBCUTANEOUS
  Filled 2020-04-05: qty 1

## 2020-04-05 NOTE — Progress Notes (Signed)
Patient states he feels very weak. But denies any other concerns or pain at this time.

## 2020-05-03 ENCOUNTER — Other Ambulatory Visit: Payer: Self-pay | Admitting: Urology

## 2020-05-03 ENCOUNTER — Other Ambulatory Visit (HOSPITAL_COMMUNITY): Payer: Self-pay | Admitting: Urology

## 2020-05-03 DIAGNOSIS — N281 Cyst of kidney, acquired: Secondary | ICD-10-CM

## 2020-05-06 ENCOUNTER — Inpatient Hospital Stay: Payer: Medicare Other | Attending: Oncology

## 2020-05-06 ENCOUNTER — Other Ambulatory Visit: Payer: Self-pay

## 2020-05-06 ENCOUNTER — Inpatient Hospital Stay: Payer: Medicare Other

## 2020-05-06 VITALS — BP 131/50 | HR 61

## 2020-05-06 DIAGNOSIS — N183 Chronic kidney disease, stage 3 unspecified: Secondary | ICD-10-CM

## 2020-05-06 DIAGNOSIS — N184 Chronic kidney disease, stage 4 (severe): Secondary | ICD-10-CM

## 2020-05-06 DIAGNOSIS — D631 Anemia in chronic kidney disease: Secondary | ICD-10-CM | POA: Insufficient documentation

## 2020-05-06 LAB — CBC WITH DIFFERENTIAL/PLATELET
Abs Immature Granulocytes: 0.03 10*3/uL (ref 0.00–0.07)
Basophils Absolute: 0 10*3/uL (ref 0.0–0.1)
Basophils Relative: 0 %
Eosinophils Absolute: 0.5 10*3/uL (ref 0.0–0.5)
Eosinophils Relative: 7 %
HCT: 22.6 % — ABNORMAL LOW (ref 39.0–52.0)
Hemoglobin: 7.6 g/dL — ABNORMAL LOW (ref 13.0–17.0)
Immature Granulocytes: 0 %
Lymphocytes Relative: 27 %
Lymphs Abs: 1.9 10*3/uL (ref 0.7–4.0)
MCH: 31.1 pg (ref 26.0–34.0)
MCHC: 33.6 g/dL (ref 30.0–36.0)
MCV: 92.6 fL (ref 80.0–100.0)
Monocytes Absolute: 0.5 10*3/uL (ref 0.1–1.0)
Monocytes Relative: 8 %
Neutro Abs: 4.1 10*3/uL (ref 1.7–7.7)
Neutrophils Relative %: 58 %
Platelets: 143 10*3/uL — ABNORMAL LOW (ref 150–400)
RBC: 2.44 MIL/uL — ABNORMAL LOW (ref 4.22–5.81)
RDW: 14.6 % (ref 11.5–15.5)
WBC: 7 10*3/uL (ref 4.0–10.5)
nRBC: 0 % (ref 0.0–0.2)

## 2020-05-06 LAB — FERRITIN: Ferritin: 224 ng/mL (ref 24–336)

## 2020-05-06 LAB — IRON AND TIBC
Iron: 73 ug/dL (ref 45–182)
Saturation Ratios: 33 % (ref 17.9–39.5)
TIBC: 221 ug/dL — ABNORMAL LOW (ref 250–450)
UIBC: 148 ug/dL

## 2020-05-06 MED ORDER — EPOETIN ALFA-EPBX 40000 UNIT/ML IJ SOLN
40000.0000 [IU] | Freq: Once | INTRAMUSCULAR | Status: AC
  Administered 2020-05-06: 40000 [IU] via SUBCUTANEOUS
  Filled 2020-05-06: qty 1

## 2020-05-21 ENCOUNTER — Ambulatory Visit: Payer: Medicare Other

## 2020-05-24 ENCOUNTER — Other Ambulatory Visit: Payer: Self-pay | Admitting: Urology

## 2020-05-24 ENCOUNTER — Other Ambulatory Visit: Payer: Self-pay

## 2020-05-24 ENCOUNTER — Ambulatory Visit
Admission: RE | Admit: 2020-05-24 | Discharge: 2020-05-24 | Disposition: A | Discharge status: Home / Self Care | Payer: Medicare Other | Source: Ambulatory Visit | Attending: Urology | Admitting: Urology

## 2020-05-24 DIAGNOSIS — N281 Cyst of kidney, acquired: Secondary | ICD-10-CM

## 2020-06-10 ENCOUNTER — Inpatient Hospital Stay: Payer: Medicare Other

## 2020-06-10 ENCOUNTER — Inpatient Hospital Stay: Payer: Medicare Other | Attending: Oncology

## 2020-07-08 ENCOUNTER — Inpatient Hospital Stay: Payer: Medicare Other | Attending: Oncology

## 2020-07-08 ENCOUNTER — Inpatient Hospital Stay: Payer: Medicare Other

## 2020-07-08 ENCOUNTER — Other Ambulatory Visit: Payer: Self-pay

## 2020-07-08 VITALS — BP 137/69 | HR 60

## 2020-07-08 DIAGNOSIS — D631 Anemia in chronic kidney disease: Secondary | ICD-10-CM | POA: Diagnosis present

## 2020-07-08 DIAGNOSIS — N184 Chronic kidney disease, stage 4 (severe): Secondary | ICD-10-CM | POA: Insufficient documentation

## 2020-07-08 DIAGNOSIS — N183 Chronic kidney disease, stage 3 unspecified: Secondary | ICD-10-CM

## 2020-07-08 LAB — IRON AND TIBC
Iron: 42 ug/dL — ABNORMAL LOW (ref 45–182)
Saturation Ratios: 18 % (ref 17.9–39.5)
TIBC: 239 ug/dL — ABNORMAL LOW (ref 250–450)
UIBC: 197 ug/dL

## 2020-07-08 LAB — CBC WITH DIFFERENTIAL/PLATELET
Abs Immature Granulocytes: 0 10*3/uL (ref 0.00–0.07)
Basophils Absolute: 0 10*3/uL (ref 0.0–0.1)
Basophils Relative: 1 %
Eosinophils Absolute: 0.3 10*3/uL (ref 0.0–0.5)
Eosinophils Relative: 5 %
HCT: 22.8 % — ABNORMAL LOW (ref 39.0–52.0)
Hemoglobin: 7.5 g/dL — ABNORMAL LOW (ref 13.0–17.0)
Immature Granulocytes: 0 %
Lymphocytes Relative: 30 %
Lymphs Abs: 2.1 10*3/uL (ref 0.7–4.0)
MCH: 30.5 pg (ref 26.0–34.0)
MCHC: 32.9 g/dL (ref 30.0–36.0)
MCV: 92.7 fL (ref 80.0–100.0)
Monocytes Absolute: 0.5 10*3/uL (ref 0.1–1.0)
Monocytes Relative: 7 %
Neutro Abs: 4 10*3/uL (ref 1.7–7.7)
Neutrophils Relative %: 57 %
Platelets: 125 10*3/uL — ABNORMAL LOW (ref 150–400)
RBC: 2.46 MIL/uL — ABNORMAL LOW (ref 4.22–5.81)
RDW: 13.5 % (ref 11.5–15.5)
WBC: 7 10*3/uL (ref 4.0–10.5)
nRBC: 0 % (ref 0.0–0.2)

## 2020-07-08 LAB — FERRITIN: Ferritin: 186 ng/mL (ref 24–336)

## 2020-07-08 MED ORDER — EPOETIN ALFA-EPBX 40000 UNIT/ML IJ SOLN
40000.0000 [IU] | Freq: Once | INTRAMUSCULAR | Status: AC
  Administered 2020-07-08: 40000 [IU] via SUBCUTANEOUS
  Filled 2020-07-08: qty 1

## 2020-07-31 NOTE — Progress Notes (Deleted)
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  INTERVAL HISTORY: Patient returns to clinic today for repeat laboratory, further evaluation, and continuation of Retacrit.  He continues to have chronic joint pain from his arthritis, but otherwise feels well.  He denies any weakness or fatigue.  He has no neurologic complaints. He denies any recent fevers or illnesses. He has a good appetite and denies weight loss.  He denies any chest pain, shortness of breath, cough, or hemoptysis.  He denies any nausea, vomiting, or diarrhea. He denies any melena or hematochezia.  He has no urinary complaints.  Patient offers no further specific complaints today.  REVIEW OF SYSTEMS:   Review of Systems  Constitutional: Negative.  Negative for fever, malaise/fatigue and weight loss.  Respiratory: Negative.  Negative for cough and shortness of breath.   Cardiovascular: Negative.  Negative for chest pain and leg swelling.  Gastrointestinal: Negative.  Negative for abdominal pain, blood in stool, constipation and melena.  Genitourinary: Negative.  Negative for hematuria.  Musculoskeletal: Positive for joint pain. Negative for back pain.  Skin: Negative.  Negative for rash.  Neurological: Negative.  Negative for sensory change, focal weakness, weakness and headaches.  Psychiatric/Behavioral: Negative.  The patient is not nervous/anxious.     As per HPI. Otherwise, a complete review of systems is negative.  PAST MEDICAL HISTORY: Past Medical History:  Diagnosis Date  . Anemia   . Arthritis   . Chronic kidney disease    Stage 4 CKD  . Diabetes mellitus without complication ([place])    . GERD (gastroesophageal reflux disease)    h/o  . Gout   . Hyperlipidemia, mixed   . Hypertension   . Sleep apnea    h/o no cpap    PAST SURGICAL HISTORY: Past Surgical History:  Procedure Laterality Date  . APPENDECTOMY    . Bunion Removal Right   . COLONOSCOPY WITH PROPOFOL N/A 06/14/2015   Procedure: COLONOSCOPY WITH PROPOFOL;  Surgeon: Lollie Sails, MD;  Location[facility] ENDOSCOPY;  Service: Endoscopy;  Laterality: N/A;  . HERNIA REPAIR    . PROSTATE BIOPSY N/A 03/27/2017   Procedure: PROSTATE BIOPSY-URO NAV;  Surgeon: Royston Cowper, MD;  Location: [facility] ORS;  Service: Urology;  Laterality: N/A;  . PROSTATE BIOPSY N/A 09/11/2019   Procedure: PROSTATE BIOPSY Pearson Forster;  Surgeon: Royston Cowper, MD;  Location: [facility] ORS;  Service: Urology;  Laterality: N/A;  . RETINAL DETACHMENT SURGERY Left   . TRANSURETHRAL RESECTION OF PROSTATE      FAMILY HISTORY: Family History  Problem Relation Age of Onset  . Leukemia Father   . Leukemia Brother     ADVANCED DIRECTIVES (Y/N):  N  HEALTH MAINTENANCE: Social History   Tobacco Use  . Smoking status: Never Smoker  . Smokeless tobacco: Never Used  Vaping Use  . Vaping Use: Never used  Substance Use Topics  . Alcohol use: Yes    Comment: occ  . Drug use: No     Colonoscopy:  PAP:  Bone density:  Lipid panel:  Allergies  Allergen Reactions  . Ciprofloxacin   . Doxazosin Other (See Comments)    unknown  . Shellfish Allergy Swelling    Current Outpatient Medications  Medication Sig  Dispense Refill  . acetaminophen (TYLENOL) 500 MG tablet Take 1,000 mg by mouth every 6 (six) hours as needed for moderate pain or headache.     Marland Kitchen amLODipine (NORVASC) 10 MG tablet Take 10 mg by mouth at bedtime.     Marland Kitchen aspirin EC 81 MG tablet Take 81 mg by mouth daily.    . bicalutamide (CASODEX) 50 MG tablet Take 50 mg by mouth daily.    . enalapril (VASOTEC) 20 MG tablet Take 20 mg by mouth 2 (two) times daily.     .  finasteride (PROSCAR) 5 MG tablet Take 5 mg by mouth at bedtime.     . fluticasone (FLONASE) 50 MCG/ACT nasal spray Place 2 sprays into both nostrils daily as needed for allergies.     Marland Kitchen glimepiride (AMARYL) 4 MG tablet Take 4 mg by mouth daily with breakfast.     . glucose blood (ONE TOUCH ULTRA TEST) test strip Use as directed. Check CBG's fasting daily. Dx: E11.22, N18.4    . glucose blood test strip USE TO CHECK FASTING BLOOD SUGAR ONCE DAILY    . hydrALAZINE (APRESOLINE) 100 MG tablet Take 100 mg by mouth 3 (three) times daily.    . Insulin Pen Needle (NOVOTWIST) 32G X 5 MM MISC Use 1 Units once daily. Dx: E11.9    . loratadine (CLARITIN) 10 MG tablet Take 10 mg by mouth daily as needed for allergies.     Marland Kitchen lovastatin (MEVACOR) 20 MG tablet Take 20 mg by mouth at bedtime.    Marland Kitchen LUPRON DEPOT, 64-MONTH, 45 MG injection Inject 45 mg into the muscle every 6 (six) months.    . metoprolol (LOPRESSOR) 50 MG tablet Take 50 mg by mouth 2 (two) times daily.    . sodium bicarbonate 650 MG tablet Take 650 mg by mouth 2 (two) times daily.     Marland Kitchen sulfamethoxazole-trimethoprim (BACTRIM DS) 800-160 MG tablet Take 1 tablet by mouth 2 (two) times daily. 14 tablet 0  . trolamine salicylate (ASPERCREME) 10 % cream Apply 1 application topically as needed for muscle pain.    Marland Kitchen ULORIC 40 MG tablet Take 40 mg by mouth daily.      No current facility-administered medications for this visit.    OBJECTIVE: There were no vitals filed for this visit.   There is no height or weight on file to calculate BMI.    ECOG FS:0 - Asymptomatic  General: Well-developed, well-nourished, no acute distress. Eyes: Pink conjunctiva, anicteric sclera. HEENT: Normocephalic, moist mucous membranes. Lungs: No audible wheezing or coughing. Heart: Regular rate and rhythm. Abdomen: Soft, nontender, no obvious distention. Musculoskeletal: No edema, cyanosis, or clubbing. Neuro: Alert, answering all questions appropriately. Cranial nerves  grossly intact. Skin: No rashes or petechiae noted. Psych: Normal affect.  LAB RESULTS:  Lab Results  Component Value Date   NA 140 06/17/2018   K 4.2 06/17/2018   CL 112 (H) 06/17/2018   CO2 21 (L) 06/17/2018   GLUCOSE 186 (H) 06/17/2018   BUN 76 (H) 06/17/2018   CREATININE 4.10 (H) 08/06/2019   CALCIUM 11.3 (H) 06/17/2018   PROT 7.3 10/14/2012   ALBUMIN 2.4 (L) 10/14/2012   AST 15 10/14/2012   ALT 15 10/14/2012   ALKPHOS 84 10/14/2012   BILITOT 0.3 10/14/2012   GFRNONAA 9 (L) 06/17/2018   GFRAA 10 (L) 06/17/2018    Lab Results  Component Value Date   WBC 7.0 07/08/2020   NEUTROABS 4.0 07/08/2020   HGB 7.5 (L) 07/08/2020  HCT 22.8 (L) 07/08/2020   MCV 92.7 07/08/2020   PLT 125 (L) 07/08/2020   Lab Results  Component Value Date   IRON 42 (L) 07/08/2020   TIBC 239 (L) 07/08/2020   IRONPCTSAT 18 07/08/2020   Lab Results  Component Value Date   FERRITIN 186 07/08/2020     STUDIES: No results found.  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  PLAN:    1. 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  Iron stores continue to be within normal limits.  Previously it was noted when patient missed his Retacrit injection his hemoglobin will drop below 8.0.  Proceed with 40,000 units subcutaneous Retacrit.  Return to clinic for every 4 weeks for laboratory work and Retacrit.  Patient will then return to clinic in 4 months for repeat laboratory work, further evaluation, and continuation of treatment. 2. Thrombocytopenia: Chronic and unchanged.  Patient's platelet count is 119. 3. Chronic renal failure: Chronic and unchanged.  Continue close follow-up with nephrology.  He has not initiated dialysis yet. 4. Hypertension: Mildly improved.  Proceed with Retacrit as above. 5.  Constipation: Patient does not complain of this today.  Continue OTC stool softeners as needed. 6.   Joint pain: Continue follow-up with primary care as indicated.   Patient expressed understanding and was in agreement with this plan. He also understands that He can call clinic at any time with any questions, concerns, or complaints.   Lloyd Huger, MD   07/31/2020 8:56 AM

## 2020-08-05 ENCOUNTER — Inpatient Hospital Stay: Payer: Medicare Other

## 2020-08-05 ENCOUNTER — Other Ambulatory Visit: Payer: Self-pay | Admitting: *Deleted

## 2020-08-05 ENCOUNTER — Inpatient Hospital Stay: Payer: Medicare Other | Admitting: Oncology

## 2020-08-05 DIAGNOSIS — N183 Chronic kidney disease, stage 3 unspecified: Secondary | ICD-10-CM

## 2020-11-25 ENCOUNTER — Telehealth: Payer: Self-pay | Admitting: Oncology

## 2020-11-29 ENCOUNTER — Other Ambulatory Visit: Payer: Self-pay

## 2020-11-29 ENCOUNTER — Encounter: Payer: Self-pay | Admitting: Oncology

## 2020-11-29 ENCOUNTER — Inpatient Hospital Stay: Payer: Medicare Other | Attending: Oncology

## 2020-11-29 ENCOUNTER — Inpatient Hospital Stay: Payer: Medicare Other | Admitting: Oncology

## 2020-11-29 ENCOUNTER — Ambulatory Visit: Payer: Medicare Other

## 2020-11-29 ENCOUNTER — Inpatient Hospital Stay: Payer: Medicare Other

## 2020-11-29 VITALS — BP 145/69 | HR 68 | Temp 97.2°F | Resp 18 | Wt 198.0 lb

## 2020-11-29 DIAGNOSIS — D631 Anemia in chronic kidney disease: Secondary | ICD-10-CM

## 2020-11-29 DIAGNOSIS — N184 Chronic kidney disease, stage 4 (severe): Secondary | ICD-10-CM | POA: Diagnosis not present

## 2020-11-29 DIAGNOSIS — N183 Chronic kidney disease, stage 3 unspecified: Secondary | ICD-10-CM | POA: Diagnosis not present

## 2020-11-29 DIAGNOSIS — D696 Thrombocytopenia, unspecified: Secondary | ICD-10-CM | POA: Diagnosis not present

## 2020-11-29 LAB — CBC
HCT: 22.4 % — ABNORMAL LOW (ref 39.0–52.0)
Hemoglobin: 7.2 g/dL — ABNORMAL LOW (ref 13.0–17.0)
MCH: 30.3 pg (ref 26.0–34.0)
MCHC: 32.1 g/dL (ref 30.0–36.0)
MCV: 94.1 fL (ref 80.0–100.0)
Platelets: 114 10*3/uL — ABNORMAL LOW (ref 150–400)
RBC: 2.38 MIL/uL — ABNORMAL LOW (ref 4.22–5.81)
RDW: 14.1 % (ref 11.5–15.5)
WBC: 6.4 10*3/uL (ref 4.0–10.5)
nRBC: 0 % (ref 0.0–0.2)

## 2020-11-29 MED ORDER — EPOETIN ALFA-EPBX 40000 UNIT/ML IJ SOLN
40000.0000 [IU] | Freq: Once | INTRAMUSCULAR | Status: AC
  Administered 2020-11-29: 40000 [IU] via SUBCUTANEOUS
  Filled 2020-11-29: qty 1

## 2020-11-29 NOTE — Progress Notes (Signed)
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  INTERVAL HISTORY: Patient returns to clinic today for repeat laboratory, further evaluation, and continuation of Retacrit.  He was last seen in clinic on 04/05/2020.  He last received Retacrit on 07/08/2020.  He has missed several appointments.   He is followed by nephrology Dr. 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  His kidney function appears to be worsening and he is considering peritoneal dialysis.  Most recent lab work from 11/25/2020 shows EGFR of 13 with urine protein creatinine ratio of 0.7.  Hemoglobin 7.4, secondary hyperparathyroidism with PTH of 235, phosphorus 4.1 calcium 10.2 and serum bicarbonate of 21.  Today [name] [name] is doing well.  He reports stable energy levels.  He is currently taking care of his wife who has dementia.  He reports missing appointments with [place] and with his nephrologist secondary to choosing to spend his money to care for his wife.  He reports he knows he will need to start dialysis soon.  He has occasional days where he feels "sluggish".  Reports changing his diet to try to improve his blood sugars for his diabetes and blood pressure.   REVIEW OF SYSTEMS:   Review of Systems  Constitutional: Positive for malaise/fatigue. Negative for fever and weight loss.  Respiratory: Negative.  Negative for cough and shortness of breath.   Cardiovascular: Negative.  Negative for chest pain and leg swelling.  Gastrointestinal: Negative.  Negative for abdominal pain, blood in stool, constipation and melena.  Genitourinary: Negative.  Negative for hematuria.   Musculoskeletal: Positive for joint pain. Negative for back pain.  Skin: Negative.  Negative for rash.  Neurological: Negative.  Negative for sensory change, focal weakness, weakness and headaches.  Psychiatric/Behavioral: Negative.  The patient is not nervous/anxious.     As per HPI. Otherwise, a complete review of systems is negative.  PAST MEDICAL HISTORY: Past Medical History:  Diagnosis Date  . Anemia   . Arthritis   . Chronic kidney disease    Stage 4 CKD  . Diabetes mellitus without complication ([place])   . GERD (gastroesophageal reflux disease)    h/o  . Gout   . Hyperlipidemia, mixed   . Hypertension   . Sleep apnea    h/o no cpap    PAST SURGICAL HISTORY: Past Surgical History:  Procedure Laterality Date  . APPENDECTOMY    . Bunion Removal Right   . COLONOSCOPY WITH PROPOFOL N/A 06/14/2015   Procedure: COLONOSCOPY WITH PROPOFOL;  Surgeon: Lollie Sails, MD;  Location[facility] ENDOSCOPY;  Service: Endoscopy;  Laterality: N/A;  . HERNIA REPAIR    . PROSTATE BIOPSY N/A 03/27/2017   Procedure: PROSTATE BIOPSY-URO NAV;  Surgeon: Royston Cowper, MD;  Location: [facility] ORS;  Service: Urology;  Laterality: N/A;  . PROSTATE BIOPSY N/A 09/11/2019   Procedure: PROSTATE BIOPSY Pearson Forster;  Surgeon: Royston Cowper, MD;  Location: [facility] ORS;  Service: Urology;  Laterality: N/A;  . RETINAL DETACHMENT SURGERY Left   . TRANSURETHRAL RESECTION OF PROSTATE      FAMILY HISTORY: Family History  Problem Relation Age of Onset  . Leukemia Father   . Leukemia Brother     ADVANCED DIRECTIVES (Y/N):  N  HEALTH MAINTENANCE: Social History   Tobacco Use  . Smoking status: Never Smoker  . Smokeless tobacco: Never Used  Vaping Use  . Vaping Use: Never used  Substance Use Topics  . Alcohol use: Yes    Comment: occ  . Drug use: No     Colonoscopy:  PAP:  Bone density:  Lipid panel:  Allergies  Allergen Reactions  . Ciprofloxacin   . Doxazosin Other (See Comments)     unknown  . Shellfish Allergy Swelling    Current Outpatient Medications  Medication Sig Dispense Refill  . acetaminophen (TYLENOL) 500 MG tablet Take 1,000 mg by mouth every 6 (six) hours as needed for moderate pain or headache.     Marland Kitchen amLODipine (NORVASC) 10 MG tablet Take 10 mg by mouth at bedtime.     Marland Kitchen aspirin EC 81 MG tablet Take 81 mg by mouth daily.    . bicalutamide (CASODEX) 50 MG tablet Take 50 mg by mouth daily.    . enalapril (VASOTEC) 20 MG tablet Take 20 mg by mouth 2 (two) times daily.     . finasteride (PROSCAR) 5 MG tablet Take 5 mg by mouth at bedtime.     . fluticasone (FLONASE) 50 MCG/ACT nasal spray Place 2 sprays into both nostrils daily as needed for allergies.     Marland Kitchen glimepiride (AMARYL) 4 MG tablet Take 4 mg by mouth daily with breakfast.     . glucose blood (ONE TOUCH ULTRA TEST) test strip Use as directed. Check CBG's fasting daily. Dx: E11.22, N18.4    . glucose blood test strip USE TO CHECK FASTING BLOOD SUGAR ONCE DAILY    . hydrALAZINE (APRESOLINE) 100 MG tablet Take 100 mg by mouth 3 (three) times daily.    . Insulin Pen Needle (NOVOTWIST) 32G X 5 MM MISC Use 1 Units once daily. Dx: E11.9    . loratadine (CLARITIN) 10 MG tablet Take 10 mg by mouth daily as needed for allergies.     Marland Kitchen lovastatin (MEVACOR) 20 MG tablet Take 20 mg by mouth at bedtime.    Marland Kitchen LUPRON DEPOT, 53-MONTH, 45 MG injection Inject 45 mg into the muscle every 6 (six) months.    . metoprolol (LOPRESSOR) 50 MG tablet Take 50 mg by mouth 2 (two) times daily.    . sodium bicarbonate 650 MG tablet Take 650 mg by mouth 2 (two) times daily.     Marland Kitchen sulfamethoxazole-trimethoprim (BACTRIM DS) 800-160 MG tablet Take 1 tablet by mouth 2 (two) times daily. 14 tablet 0  . trolamine salicylate (ASPERCREME) 10 % cream Apply 1 application topically as needed for muscle pain.    Marland Kitchen ULORIC 40 MG tablet Take 40 mg by mouth daily.      No current facility-administered medications for this visit.     OBJECTIVE: There were no vitals filed for this visit.   There is no height or weight on file to calculate BMI.    ECOG FS:0 - Asymptomatic  Physical Exam Constitutional:      Appearance: Normal appearance.  HENT:     Head: Normocephalic and atraumatic.  Eyes:     Pupils: Pupils are equal, round, and reactive to light.  Cardiovascular:     Rate and Rhythm: Normal rate and regular rhythm.     Heart sounds: Normal heart sounds. No murmur heard.   Pulmonary:     Effort: Pulmonary effort is normal.     Breath sounds: Normal breath sounds. No wheezing.  Abdominal:     General: Bowel sounds are normal. There is no distension.     Palpations: Abdomen is soft.     Tenderness: There is no abdominal tenderness.  Musculoskeletal:        General: Normal range of motion.     Cervical back: Normal range of motion.  Skin:    General: Skin is warm and dry.     Findings: No rash.  Neurological:     Mental Status: He is alert and oriented to person, place, and time.  Psychiatric:        Judgment: Judgment normal.     LAB RESULTS:  Lab Results  Component Value Date   NA 140 06/17/2018   K 4.2 06/17/2018   CL 112 (H) 06/17/2018   CO2 21 (L) 06/17/2018   GLUCOSE 186 (H) 06/17/2018   BUN 76 (H) 06/17/2018   CREATININE 4.10 (H) 08/06/2019   CALCIUM 11.3 (H) 06/17/2018   PROT 7.3 10/14/2012   ALBUMIN 2.4 (L) 10/14/2012   AST 15 10/14/2012   ALT 15 10/14/2012   ALKPHOS 84 10/14/2012   BILITOT 0.3 10/14/2012   GFRNONAA 9 (L) 06/17/2018   GFRAA 10 (L) 06/17/2018    Lab Results  Component Value Date   WBC 7.0 07/08/2020   NEUTROABS 4.0 07/08/2020   HGB 7.5 (L) 07/08/2020   HCT 22.8 (L) 07/08/2020   MCV 92.7 07/08/2020   PLT 125 (L) 07/08/2020   Lab Results  Component Value Date   IRON 42 (L) 07/08/2020   TIBC 239 (L) 07/08/2020   IRONPCTSAT 18 07/08/2020   Lab Results  Component Value Date   FERRITIN 186 07/08/2020     STUDIES: No results found.  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  PLAN:    1. 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 Holley Raring with worsening renal function.  -Is considering peritoneal dialysis in the near future. -Last received Retacrit on 07/08/2020 due to missed appointments. -Most recent lab work from Dr. Elwyn Lade office from 11/25/2020 shows a hemoglobin of 6.9, platelet count 121,000, creatinine 4.66 and BUN 57. -Proceed with 40,000 units Retacrit once approved my insurance.  -RTC in 2 weeks for repeat labs, MD assessment and additional Retacrit.  2. Thrombocytopenia: -Labs from 11/29/2020 showed platelet count of 114,000. -Continue to monitor.  3. Chronic renal failure:  -Followed by Dr. Holley Raring. -Appears to be worsening. -Considering peritoneal dialysis in the near future.  4. Hypertension: -Stable.  5.  Financial strain: -Patient has missed several appointments due to lack of money for co-pay and bills. -States he chooses to care for his wife first and then will use leftover money for his medications and healthcare visits. -We will reach out to Teresa Pelton our social worker to see if we can offer him any assistance.  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 -Iron levels from November 2021 showed iron saturations of 18% with a ferritin of 186. -Iron saturations to be closer to 30% with stage V CKD. -We will recheck iron studies at next visit.  Disposition: -Retacrit today.  -RTC in 2 weeks with repeat labs (CBC), Retacrit and assessment.  Patient expressed understanding and was in agreement with this plan. He also understands that He can call clinic at any time with any questions, concerns, or complaints.   Jacquelin Hawking, NP   11/29/2020 9:15 AM

## 2020-12-07 NOTE — Progress Notes (Signed)
Patient called back stating that he is currently doing fine caring for his wife, but would call back if needed.

## 2020-12-07 NOTE — Progress Notes (Signed)
PSN received a referral that patient wanted information about caring for his wife at home.  PSN left patient a voice mail message, today, asking that he call back to discuss the matter.

## 2020-12-12 NOTE — Progress Notes (Deleted)
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  INTERVAL HISTORY: Patient returns to clinic today for repeat laboratory, further evaluation, and continuation of Retacrit.  He continues to have chronic joint pain from his arthritis, but otherwise feels well.  He denies any weakness or fatigue.  He has no neurologic complaints. He denies any recent fevers or illnesses. He has a good appetite and denies weight loss.  He denies any chest pain, shortness of breath, cough, or hemoptysis.  He denies any nausea, vomiting, or diarrhea. He denies any melena or hematochezia.  He has no urinary complaints.  Patient offers no further specific complaints today.  REVIEW OF SYSTEMS:   Review of Systems  Constitutional: Negative.  Negative for fever, malaise/fatigue and weight loss.  Respiratory: Negative.  Negative for cough and shortness of breath.   Cardiovascular: Negative.  Negative for chest pain and leg swelling.  Gastrointestinal: Negative.  Negative for abdominal pain, blood in stool, constipation and melena.  Genitourinary: Negative.  Negative for hematuria.  Musculoskeletal: Positive for joint pain. Negative for back pain.  Skin: Negative.  Negative for rash.  Neurological: Negative.  Negative for sensory change, focal weakness, weakness and headaches.  Psychiatric/Behavioral: Negative.  The patient is not nervous/anxious.     As per HPI. Otherwise, a complete review of systems is negative.  PAST MEDICAL HISTORY: Past Medical History:  Diagnosis Date  . Anemia   . Arthritis   . Chronic kidney disease    Stage 4 CKD  . Diabetes mellitus without complication ([place])    . GERD (gastroesophageal reflux disease)    h/o  . Gout   . Hyperlipidemia, mixed   . Hypertension   . Sleep apnea    h/o no cpap    PAST SURGICAL HISTORY: Past Surgical History:  Procedure Laterality Date  . APPENDECTOMY    . Bunion Removal Right   . COLONOSCOPY WITH PROPOFOL N/A 06/14/2015   Procedure: COLONOSCOPY WITH PROPOFOL;  Surgeon: Lollie Sails, MD;  [facility] ENDOSCOPY;  Service: Endoscopy;  Laterality: N/A;  . HERNIA REPAIR    . PROSTATE BIOPSY N/A 03/27/2017   Procedure: PROSTATE BIOPSY-URO NAV;  Surgeon: Royston Cowper, MD;  Location: [facility] ORS;  Service: Urology;  Laterality: N/A;  . PROSTATE BIOPSY N/A 09/11/2019   Procedure: PROSTATE BIOPSY Pearson Forster;  Surgeon: Royston Cowper, MD;  Location: [facility] ORS;  Service: Urology;  Laterality: N/A;  . RETINAL DETACHMENT SURGERY Left   . TRANSURETHRAL RESECTION OF PROSTATE      FAMILY HISTORY: Family History  Problem Relation Age of Onset  . Leukemia Father   . Leukemia Brother     ADVANCED DIRECTIVES (Y/N):  N  HEALTH MAINTENANCE: Social History   Tobacco Use  . Smoking status: Never Smoker  . Smokeless tobacco: Never Used  Vaping Use  . Vaping Use: Never used  Substance Use Topics  . Alcohol use: Yes    Comment: occ  . Drug use: No     Colonoscopy:  PAP:  Bone density:  Lipid panel:  Allergies  Allergen Reactions  . Ciprofloxacin   . Doxazosin Other (See Comments)    unknown  . Shellfish Allergy Swelling    Current Outpatient Medications  Medication Sig  Dispense Refill  . acetaminophen (TYLENOL) 500 MG tablet Take 1,000 mg by mouth every 6 (six) hours as needed for moderate pain or headache.     Marland Kitchen amLODipine (NORVASC) 10 MG tablet Take 10 mg by mouth at bedtime.     Marland Kitchen aspirin EC 81 MG tablet Take 81 mg by mouth daily.    . bicalutamide (CASODEX) 50 MG tablet Take 50 mg by mouth daily.    . enalapril (VASOTEC) 20 MG tablet Take 20 mg by mouth 2 (two) times daily.     .  finasteride (PROSCAR) 5 MG tablet Take 5 mg by mouth at bedtime.     . fluticasone (FLONASE) 50 MCG/ACT nasal spray Place 2 sprays into both nostrils daily as needed for allergies.     Marland Kitchen glimepiride (AMARYL) 4 MG tablet Take 4 mg by mouth daily with breakfast.     . glucose blood test strip Use as directed. Check CBG's fasting daily. Dx: E11.22, N18.4    . glucose blood test strip USE TO CHECK FASTING BLOOD SUGAR ONCE DAILY    . hydrALAZINE (APRESOLINE) 100 MG tablet Take 100 mg by mouth 3 (three) times daily.    . Insulin Pen Needle 32G X 5 MM MISC Use 1 Units once daily. Dx: E11.9    . loratadine (CLARITIN) 10 MG tablet Take 10 mg by mouth daily as needed for allergies.     Marland Kitchen lovastatin (MEVACOR) 20 MG tablet Take 20 mg by mouth at bedtime.    Marland Kitchen LUPRON DEPOT, 59-MONTH, 45 MG injection Inject 45 mg into the muscle every 6 (six) months.    . metoprolol (LOPRESSOR) 50 MG tablet Take 50 mg by mouth 2 (two) times daily.    . sodium bicarbonate 650 MG tablet Take 650 mg by mouth 2 (two) times daily.     Marland Kitchen sulfamethoxazole-trimethoprim (BACTRIM DS) 800-160 MG tablet Take 1 tablet by mouth 2 (two) times daily. 14 tablet 0  . trolamine salicylate (ASPERCREME) 10 % cream Apply 1 application topically as needed for muscle pain.    Marland Kitchen ULORIC 40 MG tablet Take 40 mg by mouth daily.      No current facility-administered medications for this visit.    OBJECTIVE: There were no vitals filed for this visit.   There is no height or weight on file to calculate BMI.    ECOG FS:0 - Asymptomatic  General: Well-developed, well-nourished, no acute distress. Eyes: Pink conjunctiva, anicteric sclera. HEENT: Normocephalic, moist mucous membranes. Lungs: No audible wheezing or coughing. Heart: Regular rate and rhythm. Abdomen: Soft, nontender, no obvious distention. Musculoskeletal: No edema, cyanosis, or clubbing. Neuro: Alert, answering all questions appropriately. Cranial nerves grossly intact. Skin: No rashes  or petechiae noted. Psych: Normal affect.  LAB RESULTS:  Lab Results  Component Value Date   NA 140 06/17/2018   K 4.2 06/17/2018   CL 112 (H) 06/17/2018   CO2 21 (L) 06/17/2018   GLUCOSE 186 (H) 06/17/2018   BUN 76 (H) 06/17/2018   CREATININE 4.10 (H) 08/06/2019   CALCIUM 11.3 (H) 06/17/2018   PROT 7.3 10/14/2012   ALBUMIN 2.4 (L) 10/14/2012   AST 15 10/14/2012   ALT 15 10/14/2012   ALKPHOS 84 10/14/2012   BILITOT 0.3 10/14/2012   GFRNONAA 9 (L) 06/17/2018   GFRAA 10 (L) 06/17/2018    Lab Results  Component Value Date   WBC 6.4 11/29/2020   NEUTROABS 4.0 07/08/2020   HGB 7.2 (L) 11/29/2020   HCT 22.4 (L)  11/29/2020   MCV 94.1 11/29/2020   PLT 114 (L) 11/29/2020   Lab Results  Component Value Date   IRON 42 (L) 07/08/2020   TIBC 239 (L) 07/08/2020   IRONPCTSAT 18 07/08/2020   Lab Results  Component Value Date   FERRITIN 186 07/08/2020     STUDIES: No results found.  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  PLAN:    1. 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  Iron stores continue to be within normal limits.  Previously it was noted when patient missed his Retacrit injection his hemoglobin will drop below 8.0.  Proceed with 40,000 units subcutaneous Retacrit.  Return to clinic for every 4 weeks for laboratory work and Retacrit.  Patient will then return to clinic in 4 months for repeat laboratory work, further evaluation, and continuation of treatment. 2. Thrombocytopenia: Chronic and unchanged.  Patient's platelet count is 119. 3. Chronic renal failure: Chronic and unchanged.  Continue close follow-up with nephrology.  He has not initiated dialysis yet. 4. Hypertension: Mildly improved.  Proceed with Retacrit as above. 5.  Constipation: Patient does not complain of this today.  Continue OTC stool softeners as needed. 6.  Joint pain: Continue follow-up  with primary care as indicated.   Patient expressed understanding and was in agreement with this plan. He also understands that He can call clinic at any time with any questions, concerns, or complaints.   Lloyd Huger, MD   12/12/2020 10:00 AM

## 2020-12-13 ENCOUNTER — Inpatient Hospital Stay: Payer: Medicare Other

## 2020-12-13 ENCOUNTER — Inpatient Hospital Stay: Payer: Medicare Other | Admitting: Oncology

## 2020-12-13 DIAGNOSIS — D631 Anemia in chronic kidney disease: Secondary | ICD-10-CM

## 2020-12-15 DIAGNOSIS — J301 Allergic rhinitis due to pollen: Secondary | ICD-10-CM | POA: Insufficient documentation

## 2020-12-15 DIAGNOSIS — K439 Ventral hernia without obstruction or gangrene: Secondary | ICD-10-CM | POA: Insufficient documentation

## 2020-12-18 NOTE — Progress Notes (Signed)
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  INTERVAL HISTORY: Patient returns to clinic today for repeat laboratory work, further evaluation, and continuation of Retacrit.  He currently feels well and is asymptomatic.  He does not complain of any weakness or fatigue.  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 He has no neurologic complaints. He denies any recent fevers or illnesses. He has a good appetite and denies weight loss.  He denies any chest pain, shortness of breath, cough, or hemoptysis.  He denies any nausea, vomiting, or diarrhea. He denies any melena or hematochezia.  He has no urinary complaints.  Patient offers no specific complaints today.  REVIEW OF SYSTEMS:   Review of Systems  Constitutional: Negative.  Negative for fever, malaise/fatigue and weight loss.  Respiratory: Negative.  Negative for cough and shortness of breath.   Cardiovascular: Negative.  Negative for chest pain and leg swelling.  Gastrointestinal: Negative.  Negative for abdominal pain, blood in stool, constipation and melena.  Genitourinary: Negative.  Negative for hematuria.  Musculoskeletal: Positive for joint pain. Negative for back pain.  Skin: Negative.  Negative for rash.  Neurological: Negative.  Negative for sensory change, focal weakness, weakness and headaches.  Psychiatric/Behavioral: Negative.  The patient is not nervous/anxious.     As per HPI. Otherwise, a complete review of systems is negative.  PAST MEDICAL HISTORY: Past Medical History:  Diagnosis Date  . Anemia   . Arthritis   . Chronic kidney disease    Stage 4 CKD  .  Diabetes mellitus without complication ([place])   . GERD (gastroesophageal reflux disease)    h/o  . Gout   . Hyperlipidemia, mixed   . Hypertension   . Sleep apnea    h/o no cpap    PAST SURGICAL HISTORY: Past Surgical History:  Procedure Laterality Date  . APPENDECTOMY    . Bunion Removal Right   . COLONOSCOPY WITH PROPOFOL N/A 06/14/2015   Procedure: COLONOSCOPY WITH PROPOFOL;  Surgeon: Lollie Sails, MD;  Location: [facility] ENDOSCOPY;  Service: Endoscopy;  Laterality: N/A;  . HERNIA REPAIR    . PROSTATE BIOPSY N/A 03/27/2017   Procedure: PROSTATE BIOPSY-URO NAV;  Surgeon: Royston Cowper, MD;  Location: [facility] ORS;  Service: Urology;  Laterality: N/A;  . PROSTATE BIOPSY N/A 09/11/2019   Procedure: PROSTATE BIOPSY Pearson Forster;  Surgeon: Royston Cowper, MD;  Location: [facility] ORS;  Service: Urology;  Laterality: N/A;  . RETINAL DETACHMENT SURGERY Left   . TRANSURETHRAL RESECTION OF PROSTATE      FAMILY HISTORY: Family History  Problem Relation Age of Onset  . Leukemia Father   . Leukemia Brother     ADVANCED DIRECTIVES (Y/N):  N  HEALTH MAINTENANCE: Social History   Tobacco Use  . Smoking status: Never Smoker  . Smokeless tobacco: Never Used  Vaping Use  . Vaping Use: Never used  Substance Use Topics  . Alcohol use: Yes    Comment: occ  . Drug use: No     Colonoscopy:  PAP:  Bone density:  Lipid panel:  Allergies  Allergen Reactions  . Ciprofloxacin   . Doxazosin Other (See Comments)    unknown  . Shellfish Allergy Swelling  Current Outpatient Medications  Medication Sig Dispense Refill  . acetaminophen (TYLENOL) 500 MG tablet Take 1,000 mg by mouth every 6 (six) hours as needed for moderate pain or headache.     Marland Kitchen amLODipine (NORVASC) 10 MG tablet Take 10 mg by mouth at bedtime.     Marland Kitchen aspirin EC 81 MG tablet Take 81 mg by mouth daily.    . bicalutamide (CASODEX) 50 MG tablet Take 50 mg by mouth daily.    . enalapril (VASOTEC) 20 MG tablet Take 20 mg  by mouth 2 (two) times daily.     . finasteride (PROSCAR) 5 MG tablet Take 5 mg by mouth at bedtime.     . fluticasone (FLONASE) 50 MCG/ACT nasal spray Place 2 sprays into both nostrils daily as needed for allergies.     Marland Kitchen glimepiride (AMARYL) 4 MG tablet Take 4 mg by mouth daily with breakfast.     . glucose blood test strip Use as directed. Check CBG's fasting daily. Dx: E11.22, N18.4    . glucose blood test strip USE TO CHECK FASTING BLOOD SUGAR ONCE DAILY    . hydrALAZINE (APRESOLINE) 100 MG tablet Take 100 mg by mouth 3 (three) times daily.    . Insulin Pen Needle 32G X 5 MM MISC Use 1 Units once daily. Dx: E11.9    . loratadine (CLARITIN) 10 MG tablet Take 10 mg by mouth daily as needed for allergies.     Marland Kitchen LUPRON DEPOT, 24-MONTH, 45 MG injection Inject 45 mg into the muscle every 6 (six) months.    . metoprolol (LOPRESSOR) 50 MG tablet Take 50 mg by mouth 2 (two) times daily.    . sodium bicarbonate 650 MG tablet Take 650 mg by mouth 2 (two) times daily.     Marland Kitchen sulfamethoxazole-trimethoprim (BACTRIM DS) 800-160 MG tablet Take 1 tablet by mouth 2 (two) times daily. 14 tablet 0  . trolamine salicylate (ASPERCREME) 10 % cream Apply 1 application topically as needed for muscle pain.    Marland Kitchen ULORIC 40 MG tablet Take 40 mg by mouth daily.     Marland Kitchen lovastatin (MEVACOR) 20 MG tablet Take 20 mg by mouth at bedtime.     No current facility-administered medications for this visit.    OBJECTIVE: Vitals:   12/21/20 1001  BP: (!) 156/63  Pulse: (!) 58  Resp: (!) 51  Temp: (!) 95.6 F (35.3 C)  SpO2: 100%     Body mass index is 28.8 kg/m.    ECOG FS:0 - Asymptomatic  General: Well-developed, well-nourished, no acute distress. Eyes: Pink conjunctiva, anicteric sclera. HEENT: Normocephalic, moist mucous membranes. Lungs: No audible wheezing or coughing. Heart: Regular rate and rhythm. Abdomen: Soft, nontender, no obvious distention. Musculoskeletal: No edema, cyanosis, or clubbing. Neuro: Alert,  answering all questions appropriately. Cranial nerves grossly intact. Skin: No rashes or petechiae noted. Psych: Normal affect.  LAB RESULTS:  Lab Results  Component Value Date   NA 140 06/17/2018   K 4.2 06/17/2018   CL 112 (H) 06/17/2018   CO2 21 (L) 06/17/2018   GLUCOSE 186 (H) 06/17/2018   BUN 76 (H) 06/17/2018   CREATININE 4.10 (H) 08/06/2019   CALCIUM 11.3 (H) 06/17/2018   PROT 7.3 10/14/2012   ALBUMIN 2.4 (L) 10/14/2012   AST 15 10/14/2012   ALT 15 10/14/2012   ALKPHOS 84 10/14/2012   BILITOT 0.3 10/14/2012   GFRNONAA 9 (L) 06/17/2018   GFRAA 10 (L) 06/17/2018    Lab Results  Component Value  Date   WBC 6.1 12/21/2020   NEUTROABS 3.4 12/21/2020   HGB 7.9 (L) 12/21/2020   HCT 24.9 (L) 12/21/2020   MCV 95.0 12/21/2020   PLT 121 (L) 12/21/2020   Lab Results  Component Value Date   IRON 47 12/21/2020   TIBC 248 (L) 12/21/2020   IRONPCTSAT 19 12/21/2020   Lab Results  Component Value Date   FERRITIN 178 12/21/2020     STUDIES: No results found.  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  PLAN:    1. 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  Iron stores continue to be within normal limits.  Previously it was noted when patient missed his Retacrit injection his hemoglobin will drop significantly.  We discussed the possibility of increasing the frequency of injections, but patient does not wish to at this time.  Proceed with 40,000 units subcutaneous Retacrit today.  Return to clinic every 4 weeks for laboratory work and treatment.  Patient will then return to clinic in 4 months for evaluation and continuation of treatment. 2. Thrombocytopenia: Chronic and unchanged.  Patient's platelet count was 121. 3. Chronic renal failure: Chronic and unchanged.  Continue close follow-up with nephrology.  He has not initiated dialysis yet. 4. Hypertension: Chronic and  unchanged.  Proceed with Retacrit as above. 5.  Constipation: Resolved. 6.  Joint pain: Chronic and unchanged.  Continue follow-up with primary care as indicated.   Patient expressed understanding and was in agreement with this plan. He also understands that He can call clinic at any time with any questions, concerns, or complaints.   Lloyd Huger, MD   12/23/2020 6:31 AM

## 2020-12-21 ENCOUNTER — Other Ambulatory Visit: Payer: Self-pay

## 2020-12-21 ENCOUNTER — Inpatient Hospital Stay: Payer: Medicare Other

## 2020-12-21 ENCOUNTER — Encounter: Payer: Self-pay | Admitting: Oncology

## 2020-12-21 ENCOUNTER — Inpatient Hospital Stay: Payer: Medicare Other | Attending: Oncology | Admitting: Oncology

## 2020-12-21 VITALS — BP 156/63 | HR 58 | Temp 95.6°F | Resp 51 | Ht 69.0 in | Wt 195.0 lb

## 2020-12-21 DIAGNOSIS — N184 Chronic kidney disease, stage 4 (severe): Secondary | ICD-10-CM | POA: Insufficient documentation

## 2020-12-21 DIAGNOSIS — N183 Chronic kidney disease, stage 3 unspecified: Secondary | ICD-10-CM

## 2020-12-21 DIAGNOSIS — D631 Anemia in chronic kidney disease: Secondary | ICD-10-CM | POA: Insufficient documentation

## 2020-12-21 DIAGNOSIS — D696 Thrombocytopenia, unspecified: Secondary | ICD-10-CM | POA: Insufficient documentation

## 2020-12-21 LAB — CBC WITH DIFFERENTIAL/PLATELET
Abs Immature Granulocytes: 0.02 10*3/uL (ref 0.00–0.07)
Basophils Absolute: 0 10*3/uL (ref 0.0–0.1)
Basophils Relative: 1 %
Eosinophils Absolute: 0.5 10*3/uL (ref 0.0–0.5)
Eosinophils Relative: 9 %
HCT: 24.9 % — ABNORMAL LOW (ref 39.0–52.0)
Hemoglobin: 7.9 g/dL — ABNORMAL LOW (ref 13.0–17.0)
Immature Granulocytes: 0 %
Lymphocytes Relative: 29 %
Lymphs Abs: 1.8 10*3/uL (ref 0.7–4.0)
MCH: 30.2 pg (ref 26.0–34.0)
MCHC: 31.7 g/dL (ref 30.0–36.0)
MCV: 95 fL (ref 80.0–100.0)
Monocytes Absolute: 0.4 10*3/uL (ref 0.1–1.0)
Monocytes Relative: 6 %
Neutro Abs: 3.4 10*3/uL (ref 1.7–7.7)
Neutrophils Relative %: 55 %
Platelets: 121 10*3/uL — ABNORMAL LOW (ref 150–400)
RBC: 2.62 MIL/uL — ABNORMAL LOW (ref 4.22–5.81)
RDW: 14.1 % (ref 11.5–15.5)
WBC: 6.1 10*3/uL (ref 4.0–10.5)
nRBC: 0 % (ref 0.0–0.2)

## 2020-12-21 LAB — IRON AND TIBC
Iron: 47 ug/dL (ref 45–182)
Saturation Ratios: 19 % (ref 17.9–39.5)
TIBC: 248 ug/dL — ABNORMAL LOW (ref 250–450)
UIBC: 201 ug/dL

## 2020-12-21 LAB — FERRITIN: Ferritin: 178 ng/mL (ref 24–336)

## 2020-12-21 MED ORDER — EPOETIN ALFA-EPBX 40000 UNIT/ML IJ SOLN
40000.0000 [IU] | Freq: Once | INTRAMUSCULAR | Status: AC
  Administered 2020-12-21: 40000 [IU] via SUBCUTANEOUS
  Filled 2020-12-21: qty 1

## 2021-01-21 ENCOUNTER — Inpatient Hospital Stay: Payer: Medicare Other

## 2021-01-21 ENCOUNTER — Other Ambulatory Visit: Payer: Self-pay

## 2021-01-21 ENCOUNTER — Inpatient Hospital Stay: Payer: Medicare Other | Attending: Oncology

## 2021-01-21 VITALS — BP 145/65 | HR 52

## 2021-01-21 DIAGNOSIS — D631 Anemia in chronic kidney disease: Secondary | ICD-10-CM | POA: Insufficient documentation

## 2021-01-21 DIAGNOSIS — N184 Chronic kidney disease, stage 4 (severe): Secondary | ICD-10-CM

## 2021-01-21 DIAGNOSIS — D696 Thrombocytopenia, unspecified: Secondary | ICD-10-CM | POA: Insufficient documentation

## 2021-01-21 LAB — CBC WITH DIFFERENTIAL/PLATELET
Abs Immature Granulocytes: 0.02 10*3/uL (ref 0.00–0.07)
Basophils Absolute: 0 10*3/uL (ref 0.0–0.1)
Basophils Relative: 1 %
Eosinophils Absolute: 0.3 10*3/uL (ref 0.0–0.5)
Eosinophils Relative: 6 %
HCT: 24.2 % — ABNORMAL LOW (ref 39.0–52.0)
Hemoglobin: 7.9 g/dL — ABNORMAL LOW (ref 13.0–17.0)
Immature Granulocytes: 0 %
Lymphocytes Relative: 35 %
Lymphs Abs: 2 10*3/uL (ref 0.7–4.0)
MCH: 30.3 pg (ref 26.0–34.0)
MCHC: 32.6 g/dL (ref 30.0–36.0)
MCV: 92.7 fL (ref 80.0–100.0)
Monocytes Absolute: 0.4 10*3/uL (ref 0.1–1.0)
Monocytes Relative: 7 %
Neutro Abs: 3 10*3/uL (ref 1.7–7.7)
Neutrophils Relative %: 51 %
Platelets: 121 10*3/uL — ABNORMAL LOW (ref 150–400)
RBC: 2.61 MIL/uL — ABNORMAL LOW (ref 4.22–5.81)
RDW: 14.1 % (ref 11.5–15.5)
WBC: 5.8 10*3/uL (ref 4.0–10.5)
nRBC: 0 % (ref 0.0–0.2)

## 2021-01-21 LAB — IRON AND TIBC
Iron: 55 ug/dL (ref 45–182)
Saturation Ratios: 22 % (ref 17.9–39.5)
TIBC: 246 ug/dL — ABNORMAL LOW (ref 250–450)
UIBC: 191 ug/dL

## 2021-01-21 LAB — FERRITIN: Ferritin: 140 ng/mL (ref 24–336)

## 2021-01-21 MED ORDER — EPOETIN ALFA-EPBX 40000 UNIT/ML IJ SOLN
40000.0000 [IU] | Freq: Once | INTRAMUSCULAR | Status: AC
  Administered 2021-01-21: 40000 [IU] via SUBCUTANEOUS
  Filled 2021-01-21: qty 1

## 2021-02-14 ENCOUNTER — Other Ambulatory Visit (HOSPITAL_COMMUNITY): Payer: Self-pay | Admitting: Nephrology

## 2021-02-14 ENCOUNTER — Other Ambulatory Visit: Payer: Self-pay | Admitting: Nephrology

## 2021-02-14 DIAGNOSIS — N2889 Other specified disorders of kidney and ureter: Secondary | ICD-10-CM

## 2021-02-18 ENCOUNTER — Inpatient Hospital Stay: Payer: Medicare Other | Attending: Oncology

## 2021-02-18 ENCOUNTER — Inpatient Hospital Stay: Payer: Medicare Other

## 2021-02-18 VITALS — BP 151/66 | HR 59

## 2021-02-18 DIAGNOSIS — N183 Chronic kidney disease, stage 3 unspecified: Secondary | ICD-10-CM

## 2021-02-18 DIAGNOSIS — N184 Chronic kidney disease, stage 4 (severe): Secondary | ICD-10-CM

## 2021-02-18 DIAGNOSIS — D631 Anemia in chronic kidney disease: Secondary | ICD-10-CM | POA: Insufficient documentation

## 2021-02-18 LAB — CBC WITH DIFFERENTIAL/PLATELET
Abs Immature Granulocytes: 0.01 10*3/uL (ref 0.00–0.07)
Basophils Absolute: 0 10*3/uL (ref 0.0–0.1)
Basophils Relative: 1 %
Eosinophils Absolute: 0.4 10*3/uL (ref 0.0–0.5)
Eosinophils Relative: 6 %
HCT: 26 % — ABNORMAL LOW (ref 39.0–52.0)
Hemoglobin: 8.2 g/dL — ABNORMAL LOW (ref 13.0–17.0)
Immature Granulocytes: 0 %
Lymphocytes Relative: 35 %
Lymphs Abs: 2.2 10*3/uL (ref 0.7–4.0)
MCH: 29.6 pg (ref 26.0–34.0)
MCHC: 31.5 g/dL (ref 30.0–36.0)
MCV: 93.9 fL (ref 80.0–100.0)
Monocytes Absolute: 0.5 10*3/uL (ref 0.1–1.0)
Monocytes Relative: 8 %
Neutro Abs: 3.2 10*3/uL (ref 1.7–7.7)
Neutrophils Relative %: 50 %
Platelets: 119 10*3/uL — ABNORMAL LOW (ref 150–400)
RBC: 2.77 MIL/uL — ABNORMAL LOW (ref 4.22–5.81)
RDW: 14.5 % (ref 11.5–15.5)
WBC: 6.3 10*3/uL (ref 4.0–10.5)
nRBC: 0 % (ref 0.0–0.2)

## 2021-02-18 LAB — IRON AND TIBC
Iron: 51 ug/dL (ref 45–182)
Saturation Ratios: 21 % (ref 17.9–39.5)
TIBC: 245 ug/dL — ABNORMAL LOW (ref 250–450)
UIBC: 194 ug/dL

## 2021-02-18 LAB — FERRITIN: Ferritin: 147 ng/mL (ref 24–336)

## 2021-02-18 MED ORDER — EPOETIN ALFA-EPBX 40000 UNIT/ML IJ SOLN
40000.0000 [IU] | Freq: Once | INTRAMUSCULAR | Status: AC
  Administered 2021-02-18: 40000 [IU] via SUBCUTANEOUS
  Filled 2021-02-18: qty 1

## 2021-02-24 ENCOUNTER — Other Ambulatory Visit: Payer: Self-pay

## 2021-02-24 ENCOUNTER — Ambulatory Visit
Admission: RE | Admit: 2021-02-24 | Discharge: 2021-02-24 | Disposition: A | Discharge status: Home / Self Care | Payer: Medicare Other | Source: Ambulatory Visit | Attending: Nephrology | Admitting: Nephrology

## 2021-02-24 DIAGNOSIS — N2889 Other specified disorders of kidney and ureter: Secondary | ICD-10-CM | POA: Insufficient documentation

## 2021-03-21 ENCOUNTER — Other Ambulatory Visit: Payer: Self-pay

## 2021-03-21 ENCOUNTER — Ambulatory Visit: Payer: Medicare Other | Admitting: Surgery

## 2021-03-21 ENCOUNTER — Encounter: Payer: Self-pay | Admitting: Surgery

## 2021-03-21 VITALS — BP 152/63 | HR 59 | Temp 98.3°F | Ht 69.0 in | Wt 195.2 lb

## 2021-03-21 DIAGNOSIS — N186 End stage renal disease: Secondary | ICD-10-CM | POA: Diagnosis not present

## 2021-03-21 DIAGNOSIS — Z992 Dependence on renal dialysis: Secondary | ICD-10-CM | POA: Diagnosis not present

## 2021-03-21 NOTE — Progress Notes (Signed)
Patient ID: [name], male   DOB: 06/20/1941, 80 y.o.   MRN: [id number]  HPI [name] is a 80 y.o. male seen in consultation at the request of [name] for peritoneal dialysis placement.  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  He does have in addition to these hyperparathyroidism with PTH of 369 and a calcium of 10.4.  His most recent EGFR was 13.  He is currently using sodium bicarbonate for his metabolic acidosis.  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  Of note he did have a history of appendectomy and developed an incisional hernia.  He also had a CT scan that have shown reviewed showing evidence of an incisional hernia in the supraumbilical area.  No evidence of loss of domain.  Most recent labs reveal a sodium of 142 potassium 4.5 chloride of 111 calcium of 10.4 creatinine of 4.48 white count of 7.4 hemoglobin of 8.9 and platelet count is 136. Is the caregiver for his wife who has dementia.  He does have a daughter but he she lives in [place]. He had an extensive discussion about renal replacement therapy different modalities to include hemodialysis and peritoneal dialysis.  He is having second thoughts as he is the primary caregiver of his wife.  He is very concerned about his wife's health. Is active and is able to perform more than 4 METS of activity without any shortness of breath or chest pain.  HPI  Past Medical History:  Diagnosis Date   Anemia    Arthritis    Chronic kidney disease    Stage 4 CKD   Diabetes mellitus without complication (H[facility])    GERD (gastroesophageal reflux disease)    h/o   Gout    Hyperlipidemia, mixed    Hypertension    Sleep apnea    h/o no cpap    Past Surgical History:  Procedure Laterality Date   APPENDECTOMY     Bunion Removal Right    COLONOSCOPY WITH PROPOFOL N/A 06/14/2015   Procedure: COLONOSCOPY WITH PROPOFOL;  Surgeon: [name] Sails, MD;  Location: [name];  Service: Endoscopy;  Laterality: N/A;   HERNIA REPAIR     PROSTATE BIOPSY N/A 03/27/2017   Procedure: PROSTATE BIOPSY-URO NAV;  Surgeon: [name] Cowper, MD;  [facility]: [name];  Service: Urology;  Laterality: N/A;   PROSTATE BIOPSY N/A 09/11/2019   Procedure: PROSTATE BIOPSY [name];  Surgeon: [name] Cowper, MD;  Location: [name];  Service: Urology;  Laterality: N/A;   RETINAL DETACHMENT SURGERY Left    TRANSURETHRAL RESECTION OF PROSTATE      Family History  Problem Relation Age of Onset   Leukemia Father    Leukemia Brother     Social History Social History   Tobacco Use   Smoking status: Never   Smokeless tobacco: Never  Vaping Use   Vaping Use: Never used  Substance Use Topics   Alcohol use: Yes    Comment: occ   Drug use: No    Allergies  Allergen Reactions   Ciprofloxacin    Doxazosin Other (See Comments)    unknown   Shellfish Allergy Swelling    Current Outpatient Medications  Medication Sig Dispense Refill   acetaminophen (TYLENOL) 500 MG tablet Take 1,000 mg by mouth every 6 (six) hours as needed for moderate pain or headache.      amLODipine (NORVASC) 10 MG  tablet Take 10 mg by mouth at bedtime.      aspirin EC 81 MG tablet Take 81 mg by mouth daily.     bicalutamide (CASODEX) 50 MG tablet Take 50 mg by mouth daily.     enalapril (VASOTEC) 20 MG tablet Take 20 mg by mouth 2 (two) times daily.      finasteride (PROSCAR) 5 MG tablet Take 5 mg by mouth at bedtime.      fluticasone (FLONASE) 50 MCG/ACT nasal spray Place 2 sprays into both nostrils daily as needed for allergies.      glimepiride (AMARYL) 4 MG tablet Take 4 mg by mouth daily with breakfast.      glucose blood test strip Use as directed. Check CBG's fasting daily. Dx: E11.22, N18.4     glucose blood test strip USE TO CHECK FASTING BLOOD SUGAR ONCE DAILY     hydrALAZINE (APRESOLINE) 100 MG tablet Take 100 mg by mouth 3 (three) times daily.      Insulin Pen Needle 32G X 5 MM MISC Use 1 Units once daily. Dx: E11.9     loratadine (CLARITIN) 10 MG tablet Take 10 mg by mouth daily as needed for allergies.      lovastatin (MEVACOR) 20 MG tablet Take 20 mg by mouth at bedtime.     LUPRON DEPOT, 78-MONTH, 45 MG injection Inject 45 mg into the muscle every 6 (six) months.     metoprolol (LOPRESSOR) 50 MG tablet Take 50 mg by mouth 2 (two) times daily.     sodium bicarbonate 650 MG tablet Take 650 mg by mouth 2 (two) times daily.      trolamine salicylate (ASPERCREME) 10 % cream Apply 1 application topically as needed for muscle pain.     ULORIC 40 MG tablet Take 40 mg by mouth daily.      No current facility-administered medications for this visit.     Review of Systems Full ROS  was asked and was negative except for the information on the HPI  Physical Exam Blood pressure (!) 152/63, pulse (!) 59, temperature 98.3 F (36.8 C), temperature source Oral, height '5\' 9"'  (1.753 m), weight 195 lb 3.2 oz (88.5 kg), SpO2 98 %. CONSTITUTIONAL: NAd chronically ill EYES: Pupils are equal, round, Sclera are non-icteric. EARS, NOSE, MOUTH AND THROAT: He is wearing a mask. Hearing is intact to voice. LYMPH NODES:  Lymph nodes in the neck are normal. RESPIRATORY:  Lungs are clear. There is normal respiratory effort, with equal breath sounds bilaterally, and without pathologic use of accessory muscles. CARDIOVASCULAR: Heart is regular without murmurs, gallops, or rubs. GI: The abdomen is soft, nontender, and nondistended. There are no palpable masses. Supraumbilical incisional hernia, chronically incarcerated. There is no hepatosplenomegaly. There are normal bowel sounds  GU: Rectal deferred.   MUSCULOSKELETAL: Normal muscle strength and tone. No cyanosis or edema.   SKIN: Turgor is good and there are no pathologic skin lesions or ulcers. NEUROLOGIC: Motor and sensation is grossly normal. Cranial nerves are grossly intact. PSYCH:  Oriented to person,  place and time. Affect is normal.  Data Reviewed  I have personally reviewed the patient's imaging, laboratory findings and medical records.    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  Being evaluated for PD catheter.  Extensive discussion with patient was held regarding peritoneal dialysis.  Also we talked about the procedure of peritoneal catheter placement as well as ventral hernia repair in  the same setting.  He is a caregiver for his wife and he does have second thoughts regarding enrolling into any kind of dialysis.  I had an extensive discussion with the patient regarding the surgery.  Risks, benefits and possible occasions including but not limited to infection, catheter malfunction, hernia recurrence.  He understands.  He wishes to think about it and will give [place] a call when he is ready to proceed with surgical intervention.  I provided extensive counseling.  A copy of this report was sent to the referring provider.  Time spent with the patient was 60 minutes, with more than 50% of the time spent in face-to-face education, counseling and care coordination.     Caroleen Hamman, MD FACS General Surgeon 03/21/2021, 3:29 PM

## 2021-03-21 NOTE — Patient Instructions (Addendum)
If you have any concerns or questions, please feel free to call our office.   Dialysis Dialysis is a procedure that is done when the kidneys have stopped working properly (kidney failure). It may also be done earlier if it may help improve symptoms. During dialysis, wastes, salt, and extra water are removed from the blood, and thelevels of certain minerals in the blood are maintained. Dialysis is done in sessions that are continued until the kidneys get better. If the kidneys cannot get better, such as in end-stage kidney disease, dialysis is continued for life or until you receive a new kidney from a donor (kidney transplant). Types of treatments There are two types of dialysis: hemodialysis and peritoneal dialysis. Both types of dialysis have advantages and disadvantages. Talk with your health care provider about which type of dialysis is best for you. Your lifestyle, preferences, and medical condition will be considered. In some cases, only onetype of dialysis can be chosen. Hemodialysis Hemodialysis is when blood is filtered with a machine and a filter called a dialyzer. This treatment is usually done at a hospital or dialysis center three times per week. Visits last about 3-5 hours. Before you start hemodialysis treatment, you will have minor surgery to create a vascular access point. Your vascular access will be where you'll connect to the dialyzer. Home hemodialysis may also be an option for some patients. This means that hemodialysis can be performed at home with the help of a family member or caregiver who has had special training. Peritoneal dialysis Peritoneal dialysis is when the thin lining of the abdomen (peritoneum) and a fluid called dialysate are used to filter the blood. Before you start peritoneal dialysis, a surgeon places a soft tube, called a catheter, in your belly. You may do peritoneal dialysis at home or at almost any other location. After training, most people can perform  both types of peritoneal dialysis on their own. You may need up to five exchanges a day. Each exchange takes about 30-40 minutes. The amount of time the dialysate is in your body between exchanges is called a dwell. The dwell usually lasts 1.5-3 hours and can vary with each person. You may choose to do exchanges at night while you sleep, using a machine called a cycler. What are the advantages? Advantages of hemodialysis It is done less often than peritoneal dialysis. Someone else can do the dialysis for you. If you go to a dialysis center: Your health care provider can recognize any problems you may be having. You can interact with others who are having dialysis. This can provide you with emotional support. Advantages of peritoneal dialysis It is less likely than hemodialysis to cause cramps and low blood pressure. There are fewer eating restrictions than with hemodialysis. You may do exchanges on your own wherever you are, including when you travel. If you do automated peritoneal dialysis, you can set up your cycler every night. What are the disadvantages Disadvantages of hemodialysis Generally, hemodialysis and peritoneal dialysis are safe treatments. However, problems may occur. Cramps and low blood pressure. It may leave you feeling tired on the days you have the treatment. The need to make weekly appointments and work around the center's schedule, if you go to a dialysis center. The need to take extra care when traveling. If you usually get treatment in a dialysis center, you will need to arrange to visit a dialysis center near your destination. If you are having treatments at home, you will need to take the dialyzer  with you when traveling. More eating restrictions than with peritoneal dialysis. Disadvantages of peritoneal dialysis More frequent treatments than with hemodialysis. The need to have a good use (dexterity) of your hands. You must also be able to lift bags. The need to  learn how to make your equipment free of germs (sterilization techniques). You will need to use these techniques every day to prevent infection. What happens before the treatment? Hemodialysis Before starting hemodialysis, you will have minor surgery to create a site where blood can be removed from your body and returned to your body (vascular access). There are three types of vascular accesses: Arteriovenous (AV) fistula. This type of access is created when an artery and a vein (usually in the arm) are connected during surgery. The AV fistula also has a large diameter that allows your blood to flow out and back into your body quickly. The goal is to allow high blood flow so that the largest amount of blood can pass through the dialyzer. The arteriovenous fistula usually takes 1-6 months to develop after surgery. It may last longer than the other types of vascular accesses and is less likely to become infected or cause blood clots. Arteriovenous graft. If problems with your veins prevent you from having an AV fistula, you may need an AV graft instead. This type of access is created when an artery and a vein in the arm are connected during surgery with a tube. An arteriovenous graft can usually be used within 2-3 weeks of surgery. A venous catheter. To create this type of access, a thin tube (catheter) is placed in a large vein in your neck, chest, or groin. A venous catheter can be used right away. It is usually used as a short-term access when dialysis needs to be started right away. Peritoneal dialysis Before starting peritoneal dialysis, you will have surgery to place a catheter in your abdomen. The catheter will be used to transfer a solution called dialysate to and from your abdomen. What happens during the treatment? Hemodialysis During hemodialysis, blood will leave your body through your vascular access site. Blood will travel through a tube to the dialyzer, where it is filtered.The blood will  then return to your body through a different tube. Peritoneal dialysis        At the start of a dialysis session, your abdomen will be filled with dialysate solution. During dialysis, waste, salt, and extra water in the blood will pass through the peritoneum and into the dialysate solution. The dialysate solution will be drained from the body at the end of the dialysis session. The process of filling and draining the dialysate is called an exchange. Exchanges are repeated until you have used up all of the dialysate solutionfor that day. Follow these instructions at home: Eating and drinking Make changes to your diet as told by your health care provider, such as: Limiting your intake of foods that contain a lot of phosphorus and potassium. Limiting your fluid and salt intake. Add protein to your diet because dialysis removes protein. Work with a diet and nutrition specialist (dietitian) to make a meal plan that can help improve your dialysis and your health and healthy ways to add calories to your diet because you may not have a good appetite. Take vitamins made for people with kidney failure. Activity Rest as told by your health care provider. You may have less energy. You may need to limit some physical activities when your belly is full of dialysis solution. Return to  your normal activities as told by your health care provider. Ask your health care provider what activities are safe for you. General instructions Try to adjust to the dialysis treatment, schedule, and diet. This can take some time. You may need to stop working and may not be able to do some of your normal activities. You may feel anxious or depressed when starting dialysis. Over time, many people feel better overall because of dialysis. You may be able to return to work after making some changes, such as reducing work intensity. Take over-the-counter and prescription medicines only as told by your health care provider. Keep  all follow-up visits. This is important. Where to find more information [place]: www.kidney.org American Association of Kidney Patients: Bomb[employer].gl [employer]: www.kidneyfund.org Summary During dialysis, wastes, salt, and extra water are removed from the blood, and the levels of certain minerals in the blood are maintained. There are two types of dialysis: hemodialysis and peritoneal dialysis. Hemodialysis is when the blood is filtered with a machine and a filter called a dialyzer. Peritoneal dialysis is when the peritoneum is used as a filter. You may do peritoneal dialysis at home or at almost any other location. Both types of dialysis have advantages and disadvantages. Talk with your health care provider about which type of dialysis is best for you. This information is not intended to replace advice given to you by your health care provider. Make sure you discuss any questions you have with your healthcare provider. Document Revised: 03/02/2020 Document Reviewed: 03/02/2020 Elsevier Patient Education  [place].

## 2021-03-25 ENCOUNTER — Inpatient Hospital Stay: Payer: Medicare Other

## 2021-03-25 ENCOUNTER — Inpatient Hospital Stay: Payer: Medicare Other | Attending: Oncology

## 2021-03-25 VITALS — BP 136/55 | HR 65

## 2021-03-25 DIAGNOSIS — N184 Chronic kidney disease, stage 4 (severe): Secondary | ICD-10-CM

## 2021-03-25 DIAGNOSIS — D631 Anemia in chronic kidney disease: Secondary | ICD-10-CM | POA: Insufficient documentation

## 2021-03-25 LAB — CBC
HCT: 25.1 % — ABNORMAL LOW (ref 39.0–52.0)
Hemoglobin: 8 g/dL — ABNORMAL LOW (ref 13.0–17.0)
MCH: 29.7 pg (ref 26.0–34.0)
MCHC: 31.9 g/dL (ref 30.0–36.0)
MCV: 93.3 fL (ref 80.0–100.0)
Platelets: 118 10*3/uL — ABNORMAL LOW (ref 150–400)
RBC: 2.69 MIL/uL — ABNORMAL LOW (ref 4.22–5.81)
RDW: 15.3 % (ref 11.5–15.5)
WBC: 6.8 10*3/uL (ref 4.0–10.5)
nRBC: 0 % (ref 0.0–0.2)

## 2021-03-25 MED ORDER — EPOETIN ALFA-EPBX 40000 UNIT/ML IJ SOLN
40000.0000 [IU] | Freq: Once | INTRAMUSCULAR | Status: AC
  Administered 2021-03-25: 40000 [IU] via SUBCUTANEOUS
  Filled 2021-03-25: qty 1

## 2021-03-25 NOTE — Addendum Note (Signed)
Addended by: Oneida Arenas on: 03/25/2021 11:26 AM   Modules accepted: Orders

## 2021-04-11 ENCOUNTER — Ambulatory Visit: Payer: Medicare Other | Admitting: Surgery

## 2021-04-11 ENCOUNTER — Other Ambulatory Visit: Payer: Self-pay

## 2021-04-11 ENCOUNTER — Encounter: Payer: Self-pay | Admitting: Surgery

## 2021-04-11 VITALS — BP 137/72 | HR 66 | Temp 98.4°F | Ht 69.0 in | Wt 192.4 lb

## 2021-04-11 DIAGNOSIS — Z992 Dependence on renal dialysis: Secondary | ICD-10-CM | POA: Diagnosis not present

## 2021-04-11 DIAGNOSIS — N186 End stage renal disease: Secondary | ICD-10-CM

## 2021-04-11 NOTE — Patient Instructions (Signed)
Our surgery scheduler will call to schedule your surgery within 24-48 hours. Please have the Blue surgery sheet available when speaking with her.  ?

## 2021-04-12 ENCOUNTER — Encounter: Payer: Self-pay | Admitting: Surgery

## 2021-04-12 ENCOUNTER — Telehealth: Payer: Self-pay | Admitting: Surgery

## 2021-04-12 NOTE — Telephone Encounter (Signed)
Patient has been advised of Pre-Admission date/time, COVID Testing date and Surgery date.  Surgery Date: 04/19/21 Preadmission Testing Date: 04/14/21 (phone 1p-5p)  It is explained to the patient the importance of being available for his pre op this day.  Patient verbalized understanding.    Covid Testing Date: Not needed.     Patient has been made aware to call [phone], between 1-3:00pm the day before surgery, to find out what time to arrive for surgery.

## 2021-04-12 NOTE — Progress Notes (Signed)
Outpatient Surgical Follow Up  04/12/2021  [name] [name] is an 80 y.o. male.   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  Patient is interested in now pursuing placement of peritoneal dialysis catheter.  Also has an incisional hernia that I do think that we need to address during the same operative setting.Of note he did have a history of appendectomy and developed an incisional hernia.  He also had a CT scan that have shown reviewed showing evidence of an incisional hernia in the supraumbilical area.   Initially saw him a few weeks ago and he had some agitation regarding peritoneal dialysis because he is the caregiver for his wife that has significant Alzheimer's. He has revisited his thoughts and he is open to proceeding with peritoneal dialysis He now endorses significant bilateral knee pain worse on the right than the left.  There is some swelling.  He has been present for several years.  Past Medical History:  Diagnosis Date   Anemia    Arthritis    Chronic kidney disease    Stage 4 CKD   Diabetes mellitus without complication (HCC)    GERD (gastroesophageal reflux disease)    h/o   Gout    Hyperlipidemia, mixed    Hypertension    Sleep apnea    h/o no cpap    Past Surgical History:  Procedure Laterality Date   APPENDECTOMY     Bunion Removal Right    COLONOSCOPY WITH PROPOFOL N/A 06/14/2015   Procedure: COLONOSCOPY WITH PROPOFOL;  Surgeon: Lollie Sails, MD;  Location[facility] ENDOSCOPY;  Service: Endoscopy;  Laterality: N/A;   HERNIA REPAIR     PROSTATE BIOPSY N/A 03/27/2017   Procedure: PROSTATE BIOPSY-URO NAV;  Surgeon: Royston Cowper, MD;  Location: [facility] ORS;  Service: Urology;  Laterality: N/A;   PROSTATE BIOPSY N/A 09/11/2019   Procedure: PROSTATE BIOPSY Pearson Forster;  Surgeon: Royston Cowper, MD;   Location: [facility] ORS;  Service: Urology;  Laterality: N/A;   RETINAL DETACHMENT SURGERY Left    TRANSURETHRAL RESECTION OF PROSTATE      Family History  Problem Relation Age of Onset   Leukemia Father    Leukemia Brother     Social History:  reports that he has never smoked. He has never used smokeless tobacco. He reports current alcohol use. He reports that he does not use drugs.  Allergies:  Allergies  Allergen Reactions   Ciprofloxacin    Doxazosin Other (See Comments)    unknown   Shellfish Allergy Swelling    Medications reviewed.    ROS Full ROS performed and is otherwise negative other than what is stated in HPI   BP 137/72   Pulse 66   Temp 98.4 F (36.9 C) (Oral)   Ht 5\' 9"  (1.753 m)   Wt 192 lb 6.4 oz (87.3 kg)   SpO2 97%   BMI 28.41 kg/m   Physical Exam Constitutional:      General: He is not in acute distress.    Appearance: Normal appearance. He is normal weight. He is not toxic-appearing or diaphoretic.  Eyes:     General: No scleral icterus.       Right eye: No discharge.        Left eye: No discharge.  Cardiovascular:     Rate and  Rhythm: Normal rate and regular rhythm.  Pulmonary:     Effort: Pulmonary effort is normal. No respiratory distress.     Breath sounds: Normal breath sounds. No stridor.  Abdominal:     General: Abdomen is flat. There is no distension.     Palpations: Abdomen is soft. There is no mass.     Tenderness: There is no abdominal tenderness. There is no guarding or rebound.     Hernia: A hernia is present.     Comments: Reducible supraumbilical incisional hernia  Musculoskeletal:        General: No swelling or tenderness. Normal range of motion.     Cervical back: Normal range of motion and neck supple. No rigidity or tenderness.  Skin:    General: Skin is warm and dry.     Capillary Refill: Capillary refill takes less than 2 seconds.     Coloration: Skin is not jaundiced.  Neurological:     General: No focal deficit  present.     Mental Status: He is alert and oriented to person, place, and time.  Psychiatric:        Mood and Affect: Mood normal.        Behavior: Behavior normal.        Thought Content: Thought content normal.        Judgment: Judgment normal.      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  Discussed with the patient detail once again about different modalities.  He seems to be very interested in peritoneal dialysis.  We talked at length about peritoneal dialysis placement.  He seems to be interested in proceeding with the procedure.  We will tentatively put on the schedule for next week.  Plan for incisional hernia repair and lap PD catheter placement. Procedure discussed with the patient in detail.  Risks, benefits and possible applications including but not limited to: Bleeding, infection malfunctioning, injury to adjacent structures to include bowel and chronic pain.  He understands and wished to proceed.  Extensive counseling provided    Greater than 50% of the 40 minutes  visit was spent in counseling/coordination of care   Caroleen Hamman, MD [facility]

## 2021-04-14 ENCOUNTER — Other Ambulatory Visit: Payer: Self-pay

## 2021-04-14 ENCOUNTER — Encounter
Admission: RE | Admit: 2021-04-14 | Discharge: 2021-04-14 | Disposition: A | Discharge status: Home / Self Care | Payer: Medicare Other | Source: Ambulatory Visit | Attending: Surgery | Admitting: Surgery

## 2021-04-14 NOTE — Patient Instructions (Signed)
Your procedure is scheduled on: Tues 8/30 Report to [facility] registration Desk then to 2nd floor surgery desk To find out your arrival time please call [phone] between 1PM - 3PM on Mon. 8/29.  Remember: Instructions that are not followed completely may result in serious medical risk,  up to and including death, or upon the discretion of your surgeon and anesthesiologist your  surgery may need to be rescheduled.     _X__ 1. Do not eat food after midnight the night before your procedure.                 No chewing gum or hard candies. You may drink clear liquids up to 2 hours                 before you are scheduled to arrive for your surgery- DO not drink clear                 liquids within 2 hours of the start of your surgery.                 Clear Liquids include:  water, apple juice without pulp, clear Gatorade, G2 or                  Gatorade Zero (avoid Red/Purple/Blue), Black Coffee or Tea (Do not add                 anything to coffee or tea). __X__2.  On the morning of surgery brush your teeth with toothpaste and water, you                may rinse your mouth with mouthwash if you wish.  Do not swallow any toothpaste of mouthwash.     _X__ 3.  No Alcohol for 24 hours before or after surgery.   ___ 4.  Do Not Smoke or use e-cigarettes For 24 Hours Prior to Your Surgery.                 Do not use any chewable tobacco products for at least 6 hours prior to                 Surgery.  ___  5.  Do not use any recreational drugs (marijuana, cocaine, heroin, ecstasy,       MDMA or other) For at least one week prior to your surgery.            Combination of these drugs with anesthesia may have life threatening       results.  ____  6.  Bring all medications with you on the day of surgery if instructed.   __x__  7.  Notify your doctor if there is any change in your medical condition      (cold, fever, infections).     Do not wear jewelry, Do not wear  lotions,  You may wear deodorant. Do not shave 48 hours prior to surgery. Men may shave face and neck. Do not bring valuables to the hospital.    [facility] is not responsible for any belongings or valuables.  Contacts, dentures or bridgework may not be worn into surgery. Leave your suitcase in the car. After surgery it may be brought to your room. For patients admitted to the hospital, discharge time is determined by your treatment team.   Patients discharged the day of surgery will not be allowed to drive home.   Make arrangements for someone to  be with you for the first 24 hours of your Same Day Discharge.    Please read over the following fact sheets that you were given:      __x__ Take these medicines the morning of surgery with A SIP OF WATER:    1. amLODipine (NORVASC) 10 MG tablet  2. fluticasone (FLONASE) 50 MCG/ACT nasal spray if needed  3. hydrALAZINE (APRESOLINE) 100 MG tablet  4.metoprolol (LOPRESSOR) 50 MG tablet  5.sodium bicarbonate 650 MG tablet  6.ULORIC 40 MG tablet  ____ Fleet Enema (as directed)   __x__ Use CHG Soap (or wipes) as directed  ____ Use Benzoyl Peroxide Gel as instructed  ____ Use inhalers on the day of surgery  ____ Stop metformin 2 days prior to surgery    ____ Take 1/2 of usual insulin dose the night before surgery. No insulin the morning          of surgery.   __x__ Stop aspirin today  ____ Stop Anti-inflammatories on    ____ Stop supplements until after surgery.    ____ Bring C-Pap to the hospital.    If you have any questions regarding your pre-procedure instructions,  Please call Pre-admit Testing at [phone]

## 2021-04-15 ENCOUNTER — Other Ambulatory Visit: Payer: Self-pay

## 2021-04-15 ENCOUNTER — Encounter: Payer: Self-pay | Admitting: Urgent Care

## 2021-04-15 ENCOUNTER — Encounter
Admission: RE | Admit: 2021-04-15 | Discharge: 2021-04-15 | Disposition: A | Discharge status: Home / Self Care | Payer: Medicare Other | Source: Ambulatory Visit | Attending: Surgery | Admitting: Surgery

## 2021-04-15 DIAGNOSIS — Z01810 Encounter for preprocedural cardiovascular examination: Secondary | ICD-10-CM | POA: Insufficient documentation

## 2021-04-18 ENCOUNTER — Telehealth: Payer: Self-pay | Admitting: Surgery

## 2021-04-18 NOTE — Telephone Encounter (Signed)
Incoming call from the patient.  He has cancelled his surgery for tomorrow (04/19/21).  States he wants to do more research about the PD catheter being placed.  When asking him about the ventral hernia that was also going to be repaired, he states just to cancel everything as his wife has dementia and he is making her priority at this time.  He states wants to be in a better place to have someone there to help with him after surgery and with his wife.  Patient states will call us back when he is ready for rescheduling.  At patient's request surgery for 04/19/21 is cancelled, called the OR to cancel.

## 2021-04-19 ENCOUNTER — Encounter: Admission: RE | Payer: Self-pay | Source: Home / Self Care

## 2021-04-19 ENCOUNTER — Ambulatory Visit: Admission: RE | Admit: 2021-04-19 | Payer: Medicare Other | Source: Home / Self Care | Admitting: Surgery

## 2021-04-19 SURGERY — LAPAROSCOPIC INSERTION CONTINUOUS AMBULATORY PERITONEAL DIALYSIS  (CAPD) CATHETER
Anesthesia: General

## 2021-04-22 ENCOUNTER — Ambulatory Visit: Payer: Medicare Other

## 2021-04-22 ENCOUNTER — Ambulatory Visit: Payer: Medicare Other | Admitting: Oncology

## 2021-04-22 ENCOUNTER — Other Ambulatory Visit: Payer: Medicare Other

## 2021-04-26 ENCOUNTER — Inpatient Hospital Stay: Payer: Medicare Other

## 2021-04-26 ENCOUNTER — Inpatient Hospital Stay: Payer: Medicare Other | Attending: Oncology | Admitting: Oncology

## 2021-04-26 ENCOUNTER — Encounter: Payer: Self-pay | Admitting: Oncology

## 2021-04-26 VITALS — BP 133/57 | HR 58 | Temp 97.8°F | Resp 20 | Wt 194.8 lb

## 2021-04-26 DIAGNOSIS — D631 Anemia in chronic kidney disease: Secondary | ICD-10-CM

## 2021-04-26 DIAGNOSIS — I129 Hypertensive chronic kidney disease with stage 1 through stage 4 chronic kidney disease, or unspecified chronic kidney disease: Secondary | ICD-10-CM | POA: Insufficient documentation

## 2021-04-26 DIAGNOSIS — E1122 Type 2 diabetes mellitus with diabetic chronic kidney disease: Secondary | ICD-10-CM | POA: Insufficient documentation

## 2021-04-26 DIAGNOSIS — N183 Chronic kidney disease, stage 3 unspecified: Secondary | ICD-10-CM

## 2021-04-26 DIAGNOSIS — Z794 Long term (current) use of insulin: Secondary | ICD-10-CM | POA: Diagnosis not present

## 2021-04-26 DIAGNOSIS — Z79899 Other long term (current) drug therapy: Secondary | ICD-10-CM | POA: Insufficient documentation

## 2021-04-26 DIAGNOSIS — Z7982 Long term (current) use of aspirin: Secondary | ICD-10-CM | POA: Diagnosis not present

## 2021-04-26 DIAGNOSIS — D696 Thrombocytopenia, unspecified: Secondary | ICD-10-CM | POA: Diagnosis not present

## 2021-04-26 DIAGNOSIS — N184 Chronic kidney disease, stage 4 (severe): Secondary | ICD-10-CM | POA: Insufficient documentation

## 2021-04-26 LAB — CBC WITH DIFFERENTIAL/PLATELET
Abs Immature Granulocytes: 0.01 10*3/uL (ref 0.00–0.07)
Basophils Absolute: 0 10*3/uL (ref 0.0–0.1)
Basophils Relative: 1 %
Eosinophils Absolute: 0.4 10*3/uL (ref 0.0–0.5)
Eosinophils Relative: 6 %
HCT: 23.7 % — ABNORMAL LOW (ref 39.0–52.0)
Hemoglobin: 7.7 g/dL — ABNORMAL LOW (ref 13.0–17.0)
Immature Granulocytes: 0 %
Lymphocytes Relative: 34 %
Lymphs Abs: 2.2 10*3/uL (ref 0.7–4.0)
MCH: 30.2 pg (ref 26.0–34.0)
MCHC: 32.5 g/dL (ref 30.0–36.0)
MCV: 92.9 fL (ref 80.0–100.0)
Monocytes Absolute: 0.6 10*3/uL (ref 0.1–1.0)
Monocytes Relative: 9 %
Neutro Abs: 3.3 10*3/uL (ref 1.7–7.7)
Neutrophils Relative %: 50 %
Platelets: 121 10*3/uL — ABNORMAL LOW (ref 150–400)
RBC: 2.55 MIL/uL — ABNORMAL LOW (ref 4.22–5.81)
RDW: 15.2 % (ref 11.5–15.5)
WBC: 6.5 10*3/uL (ref 4.0–10.5)
nRBC: 0 % (ref 0.0–0.2)

## 2021-04-26 LAB — FERRITIN: Ferritin: 206 ng/mL (ref 24–336)

## 2021-04-26 LAB — IRON AND TIBC
Iron: 65 ug/dL (ref 45–182)
Saturation Ratios: 26 % (ref 17.9–39.5)
TIBC: 252 ug/dL (ref 250–450)
UIBC: 187 ug/dL

## 2021-04-26 MED ORDER — EPOETIN ALFA-EPBX 40000 UNIT/ML IJ SOLN
40000.0000 [IU] | Freq: Once | INTRAMUSCULAR | Status: AC
  Administered 2021-04-26: 40000 [IU] via SUBCUTANEOUS
  Filled 2021-04-26: qty 1

## 2021-04-26 NOTE — Progress Notes (Signed)
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  INTERVAL HISTORY: [name] [name] is a 80 year old male with past medical history significant for hypertension, type 2 diabetes, CKD stage IV, gout and is followed by Dr. 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  He receives monthly Retacrit 40,000 units last on 04/26/2021.  Baseline hemoglobin is between 7.5-8.5.  Today he presents back for lab work and to discuss additional [place].  He reports doing fairly well since his last visit.  He continues to care for his wife who has dementia.  He reports she is doing better than previous.  Notes feeling much better for 1 week post his injection.  Energy levels tend to decline weeks 2 through 4.  He is unable to come more frequent due to financial and transportation concerns.  He also needs somebody to help sit with his wife when he has an appointment.  REVIEW OF SYSTEMS:   Review of Systems  Constitutional:  Positive for malaise/fatigue. Negative for chills, fever and weight loss.  HENT:  Negative for congestion, ear pain and tinnitus.   Eyes: Negative.  Negative for blurred vision and double vision.  Respiratory: Negative.  Negative for cough, sputum production and shortness of breath.   Cardiovascular: Negative.  Negative for chest pain, palpitations and leg swelling.  Gastrointestinal: Negative.  Negative for abdominal pain, constipation, diarrhea, nausea and vomiting.  Genitourinary:  Negative for dysuria, frequency and urgency.  Musculoskeletal:  Negative for back pain and falls.  Skin: Negative.  Negative for rash.  Neurological:  Positive for weakness. Negative for  headaches.  Endo/Heme/Allergies: Negative.  Does not bruise/bleed easily.  Psychiatric/Behavioral: Negative.  Negative for depression. The patient is not nervous/anxious and does not have insomnia.    As per HPI. Otherwise, a complete review of systems is negative.  PAST MEDICAL HISTORY: Past Medical History:  Diagnosis Date   Anemia    Arthritis    Chronic kidney disease    Stage 4 CKD   Diabetes mellitus without complication (HCC)    GERD (gastroesophageal reflux disease)    h/o   Gout    Hyperlipidemia, mixed    Hypertension    Sleep apnea    h/o no cpap    PAST SURGICAL HISTORY: Past Surgical History:  Procedure Laterality Date   APPENDECTOMY     Bunion Removal Right    COLONOSCOPY WITH PROPOFOL N/A 06/14/2015   Procedure: COLONOSCOPY WITH PROPOFOL;  Surgeon: Lollie Sails, MD;  Location: [facility] ENDOSCOPY;  Service: Endoscopy;  Laterality: N/A;   HERNIA REPAIR     abd   PROSTATE BIOPSY N/A 03/27/2017   Procedure: PROSTATE BIOPSY-URO NAV;  Surgeon: Royston Cowper, MD;  Location: [facility] ORS;  Service: Urology;  Laterality: N/A;   PROSTATE BIOPSY N/A 09/11/2019   Procedure: PROSTATE BIOPSY Pearson Forster;  Surgeon: Royston Cowper, MD;  Location: [facility] ORS;  Service: Urology;  Laterality: N/A;   RETINAL DETACHMENT SURGERY Left    TRANSURETHRAL RESECTION OF PROSTATE      FAMILY HISTORY: Family History  Problem Relation Age of Onset   Leukemia Father    Leukemia Brother     ADVANCED DIRECTIVES (Y/N):  N  HEALTH MAINTENANCE: Social History   Tobacco Use  Smoking status: Never   Smokeless tobacco: Never  Vaping Use   Vaping Use: Never used  Substance Use Topics   Alcohol use: Yes    Comment: rare   Drug use: No     Colonoscopy:  PAP:  Bone density:  Lipid panel:  Allergies  Allergen Reactions   Fish Allergy Swelling   Ciprofloxacin     Unknown reaction   Doxazosin Other (See Comments)    unknown    Current Outpatient Medications  Medication  Sig Dispense Refill   acetaminophen (TYLENOL) 500 MG tablet Take 1,000 mg by mouth every 6 (six) hours as needed for moderate pain or headache.      amLODipine (NORVASC) 10 MG tablet Take 10 mg by mouth at bedtime.      aspirin EC 81 MG tablet Take 81 mg by mouth daily.     bicalutamide (CASODEX) 50 MG tablet Take 50 mg by mouth daily.     enalapril (VASOTEC) 20 MG tablet Take 20 mg by mouth 2 (two) times daily.      fluticasone (FLONASE) 50 MCG/ACT nasal spray Place 2 sprays into both nostrils daily as needed for allergies.      glucose blood test strip USE TO CHECK FASTING BLOOD SUGAR ONCE DAILY     hydrALAZINE (APRESOLINE) 100 MG tablet Take 100 mg by mouth 3 (three) times daily.     Insulin Pen Needle 32G X 5 MM MISC Use 1 Units once daily. Dx: E11.9     Ketotifen Fumarate (ALLERGY EYE DROPS OP) Place 1-2 drops into both eyes daily as needed (allergies/irritation).     lovastatin (MEVACOR) 20 MG tablet Take 20 mg by mouth at bedtime.     LUPRON DEPOT, 37-MONTH, 45 MG injection Inject 45 mg into the muscle every 6 (six) months.     metoprolol (LOPRESSOR) 50 MG tablet Take 50 mg by mouth 2 (two) times daily.     sodium bicarbonate 650 MG tablet Take 650 mg by mouth 2 (two) times daily.      ULORIC 40 MG tablet Take 40 mg by mouth daily.      No current facility-administered medications for this visit.    OBJECTIVE: Vitals:   04/26/21 0940  BP: (!) 133/57  Pulse: (!) 58  Resp: 20  Temp: 97.8 F (36.6 C)  SpO2: 100%     Body mass index is 29.62 kg/m.    ECOG FS:0 - Asymptomatic  Physical Exam Constitutional:      Appearance: Normal appearance.  HENT:     Head: Normocephalic and atraumatic.  Eyes:     Pupils: Pupils are equal, round, and reactive to light.  Cardiovascular:     Rate and Rhythm: Normal rate and regular rhythm.     Heart sounds: Normal heart sounds. No murmur heard. Pulmonary:     Effort: Pulmonary effort is normal.     Breath sounds: Normal breath sounds. No  wheezing.  Abdominal:     General: Bowel sounds are normal. There is no distension.     Palpations: Abdomen is soft.     Tenderness: There is no abdominal tenderness.  Musculoskeletal:        General: Normal range of motion.     Cervical back: Normal range of motion.  Skin:    General: Skin is warm and dry.     Findings: No rash.  Neurological:     Mental Status: He is alert and oriented to person, place, and time.  Psychiatric:  Judgment: Judgment normal.    LAB RESULTS:  Lab Results  Component Value Date   NA 140 06/17/2018   K 4.2 06/17/2018   CL 112 (H) 06/17/2018   CO2 21 (L) 06/17/2018   GLUCOSE 186 (H) 06/17/2018   BUN 76 (H) 06/17/2018   CREATININE 4.10 (H) 08/06/2019   CALCIUM 11.3 (H) 06/17/2018   PROT 7.3 10/14/2012   ALBUMIN 2.4 (L) 10/14/2012   AST 15 10/14/2012   ALT 15 10/14/2012   ALKPHOS 84 10/14/2012   BILITOT 0.3 10/14/2012   GFRNONAA 9 (L) 06/17/2018   GFRAA 10 (L) 06/17/2018    Lab Results  Component Value Date   WBC 6.5 04/26/2021   NEUTROABS 3.3 04/26/2021   HGB 7.7 (L) 04/26/2021   HCT 23.7 (L) 04/26/2021   MCV 92.9 04/26/2021   PLT 121 (L) 04/26/2021   Lab Results  Component Value Date   IRON 51 02/18/2021   TIBC 245 (L) 02/18/2021   IRONPCTSAT 21 02/18/2021   Lab Results  Component Value Date   FERRITIN 147 02/18/2021     STUDIES: No results found.  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  PLAN:    1. 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  Baseline hemoglobin is between 7.5-8.5.  Labs from today show hemoglobin of 7.7.  Proceed with 40,000 units Retacrit.  Dr. Grayland Ormond has discussed more frequent visits but unfortunately he is unable due to financial and transportation concerns.  He also needs someone to sit with his wife while he is gone.  We did discuss potentially increasing his Retacrit from 40,000 units  to 60,000 units at his next appointment.  Patient is in agreement. New orders placed.   2. Thrombocytopenia:  This appears to be chronic.  Platelet count is 121,000 today.  We will continue to monitor.  3.  CKD stage IV- He is followed by nephrology.  Anticipate dialysis in the near future.  Patient would like to hold off as long as possible.  4.  Financial concerns- Patient expresses some concerns about the expense for his injections each month.  He is pain well ever $100 for each injection.  States he has 4-5 doctors and sees them on a monthly which is causing some financial strain.  We will reach out to social worker to see if he qualifies for any assistance.  Patient very grateful.  Disposition- RTC monthly for Retacrit injection.  We will increase to 60,000 units at his next visit due to decreasing hemoglobin.  Discussed with Dr. Grayland Ormond who is in agreement.  We will reach out to social worker to discuss financial concerns.  I spent 25 minutes dedicated to the care of this patient (face-to-face and non-face-to-face) on the date of the encounter to include what is described in the assessment and plan.  Patient expressed understanding and was in agreement with this plan. He also understands that He can call clinic at any time with any questions, concerns, or complaints.   [name] Hawking, NP   04/26/2021 9:58 AM

## 2021-05-23 NOTE — Progress Notes (Signed)
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  INTERVAL HISTORY: Patient returns to clinic today for repeat laboratory work, further evaluation, and continuation of Retacrit.  He continues to feel well and remains asymptomatic.  He denies any weakness or fatigue.  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 He has no neurologic complaints. He denies any recent fevers or illnesses. He has a good appetite and denies weight loss.  He denies any chest pain, shortness of breath, cough, or hemoptysis.  He denies any nausea, vomiting, or diarrhea. He denies any melena or hematochezia.  He has no urinary complaints.  Patient offers no specific complaints today.  REVIEW OF SYSTEMS:   Review of Systems  Constitutional: Negative.  Negative for fever, malaise/fatigue and weight loss.  Respiratory: Negative.  Negative for cough and shortness of breath.   Cardiovascular: Negative.  Negative for chest pain and leg swelling.  Gastrointestinal: Negative.  Negative for abdominal pain, blood in stool, constipation and melena.  Genitourinary: Negative.  Negative for hematuria.  Musculoskeletal: Negative.  Negative for back pain and joint pain.  Skin: Negative.  Negative for rash.  Neurological: Negative.  Negative for sensory change, focal weakness, weakness and headaches.  Psychiatric/Behavioral: Negative.  The patient is not nervous/anxious.    As per HPI. Otherwise, a complete review of systems is negative.  PAST MEDICAL HISTORY: Past Medical History:  Diagnosis Date   Anemia    Arthritis    Chronic kidney disease    Stage 4  CKD   Diabetes mellitus without complication (HCC)    GERD (gastroesophageal reflux disease)    h/o   Gout    Hyperlipidemia, mixed    Hypertension    Sleep apnea    h/o no cpap    PAST SURGICAL HISTORY: Past Surgical History:  Procedure Laterality Date   APPENDECTOMY     Bunion Removal Right    COLONOSCOPY WITH PROPOFOL N/A 06/14/2015   Procedure: COLONOSCOPY WITH PROPOFOL;  Surgeon: Lollie Sails, MD;  Location: [facility] ENDOSCOPY;  Service: Endoscopy;  Laterality: N/A;   HERNIA REPAIR     abd   PROSTATE BIOPSY N/A 03/27/2017   Procedure: PROSTATE BIOPSY-URO NAV;  Surgeon: Royston Cowper, MD;  Location: [facility] ORS;  Service: Urology;  Laterality: N/A;   PROSTATE BIOPSY N/A 09/11/2019   Procedure: PROSTATE BIOPSY Pearson Forster;  Surgeon: Royston Cowper, MD;  Location: [facility] ORS;  Service: Urology;  Laterality: N/A;   RETINAL DETACHMENT SURGERY Left    TRANSURETHRAL RESECTION OF PROSTATE      FAMILY HISTORY: Family History  Problem Relation Age of Onset   Leukemia Father    Leukemia Brother     ADVANCED DIRECTIVES (Y/N):  N  HEALTH MAINTENANCE: Social History   Tobacco Use   Smoking status: Never   Smokeless tobacco: Never  Vaping Use   Vaping Use: Never used  Substance Use Topics   Alcohol use: Yes    Comment: rare   Drug use: No     Colonoscopy:  PAP:  Bone density:  Lipid panel:  Allergies  Allergen Reactions   Fish Allergy Swelling   Ciprofloxacin     Unknown reaction   Doxazosin  Other (See Comments)    unknown    Current Outpatient Medications  Medication Sig Dispense Refill   acetaminophen (TYLENOL) 500 MG tablet Take 1,000 mg by mouth every 6 (six) hours as needed for moderate pain or headache.      amLODipine (NORVASC) 10 MG tablet Take 10 mg by mouth at bedtime.      aspirin EC 81 MG tablet Take 81 mg by mouth daily.     bicalutamide (CASODEX) 50 MG tablet Take 50 mg by mouth daily.     enalapril (VASOTEC) 20 MG tablet Take 20 mg by  mouth 2 (two) times daily.      fluticasone (FLONASE) 50 MCG/ACT nasal spray Place 2 sprays into both nostrils daily as needed for allergies.      glucose blood test strip USE TO CHECK FASTING BLOOD SUGAR ONCE DAILY     hydrALAZINE (APRESOLINE) 100 MG tablet Take 100 mg by mouth 3 (three) times daily.     Insulin Pen Needle 32G X 5 MM MISC Use 1 Units once daily. Dx: E11.9     Ketotifen Fumarate (ALLERGY EYE DROPS OP) Place 1-2 drops into both eyes daily as needed (allergies/irritation).     lovastatin (MEVACOR) 20 MG tablet Take 20 mg by mouth at bedtime.     LUPRON DEPOT, 73-MONTH, 45 MG injection Inject 45 mg into the muscle every 6 (six) months.     metoprolol (LOPRESSOR) 50 MG tablet Take 50 mg by mouth 2 (two) times daily.     sodium bicarbonate 650 MG tablet Take 650 mg by mouth 2 (two) times daily.      ULORIC 40 MG tablet Take 40 mg by mouth daily.      No current facility-administered medications for this visit.    OBJECTIVE: Vitals:   05/24/21 1109  BP: (!) 148/71  Pulse: 98  Temp: (!) 97.5 F (36.4 C)  SpO2: 97%     Body mass index is 29.03 kg/m.    ECOG FS:0 - Asymptomatic  General: Well-developed, well-nourished, no acute distress. Eyes: Pink conjunctiva, anicteric sclera. HEENT: Normocephalic, moist mucous membranes. Lungs: No audible wheezing or coughing. Heart: Regular rate and rhythm. Abdomen: Soft, nontender, no obvious distention. Musculoskeletal: No edema, cyanosis, or clubbing. Neuro: Alert, answering all questions appropriately. Cranial nerves grossly intact. Skin: No rashes or petechiae noted. Psych: Normal affect.   LAB RESULTS:  Lab Results  Component Value Date   NA 140 06/17/2018   K 4.2 06/17/2018   CL 112 (H) 06/17/2018   CO2 21 (L) 06/17/2018   GLUCOSE 186 (H) 06/17/2018   BUN 76 (H) 06/17/2018   CREATININE 4.10 (H) 08/06/2019   CALCIUM 11.3 (H) 06/17/2018   PROT 7.3 10/14/2012   ALBUMIN 2.4 (L) 10/14/2012   AST 15 10/14/2012   ALT 15  10/14/2012   ALKPHOS 84 10/14/2012   BILITOT 0.3 10/14/2012   GFRNONAA 9 (L) 06/17/2018   GFRAA 10 (L) 06/17/2018    Lab Results  Component Value Date   WBC 5.6 05/24/2021   NEUTROABS 2.9 05/24/2021   HGB 7.7 (L) 05/24/2021   HCT 24.0 (L) 05/24/2021   MCV 94.5 05/24/2021   PLT 108 (L) 05/24/2021   Lab Results  Component Value Date   IRON 65 05/24/2021   TIBC 262 05/24/2021   IRONPCTSAT 25 05/24/2021   Lab Results  Component Value Date   FERRITIN 156 05/24/2021     STUDIES: No results found.  ASSESSMENT: Thrombocytopenia, anemia secondary to chronic renal  failure.  PLAN:    1. 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  Iron stores continue to be within normal limits.  Previously it was noted when patient missed his Retacrit injection his hemoglobin drops significantly.  Despite this, patient would like to further reduce his number of visits.  Proceed with 40,000 units subcutaneous Retacrit today.  Return to clinic every 6 weeks for laboratory work and consideration of Retacrit.  Patient will then return to clinic in 6 months for laboratory work, further evaluation, and continuation of treatment. 2. Thrombocytopenia: Chronic and unchanged.  Patient's platelet count is 108. 3. Chronic renal failure: Chronic and unchanged.  Patient reports he has an appointment with nephrology later today.  Dialysis has been discussed, but not yet initiated.   4.  Hypertension: Chronic and unchanged.  Proceed with Retacrit as above.   Patient expressed understanding and was in agreement with this plan. He also understands that He can call clinic at any time with any questions, concerns, or complaints.   Lloyd Huger, MD   05/25/2021 9:44 AM

## 2021-05-24 ENCOUNTER — Other Ambulatory Visit: Payer: Self-pay

## 2021-05-24 ENCOUNTER — Inpatient Hospital Stay (HOSPITAL_BASED_OUTPATIENT_CLINIC_OR_DEPARTMENT_OTHER): Payer: Medicare Other | Admitting: Oncology

## 2021-05-24 ENCOUNTER — Inpatient Hospital Stay: Payer: Medicare Other | Attending: Oncology

## 2021-05-24 ENCOUNTER — Inpatient Hospital Stay: Payer: Medicare Other

## 2021-05-24 VITALS — BP 148/71 | HR 98 | Temp 97.5°F | Wt 190.9 lb

## 2021-05-24 DIAGNOSIS — Z7982 Long term (current) use of aspirin: Secondary | ICD-10-CM | POA: Diagnosis not present

## 2021-05-24 DIAGNOSIS — N184 Chronic kidney disease, stage 4 (severe): Secondary | ICD-10-CM

## 2021-05-24 DIAGNOSIS — Z794 Long term (current) use of insulin: Secondary | ICD-10-CM | POA: Diagnosis not present

## 2021-05-24 DIAGNOSIS — D696 Thrombocytopenia, unspecified: Secondary | ICD-10-CM | POA: Diagnosis not present

## 2021-05-24 DIAGNOSIS — D631 Anemia in chronic kidney disease: Secondary | ICD-10-CM | POA: Diagnosis not present

## 2021-05-24 DIAGNOSIS — I10 Essential (primary) hypertension: Secondary | ICD-10-CM | POA: Insufficient documentation

## 2021-05-24 DIAGNOSIS — Z79899 Other long term (current) drug therapy: Secondary | ICD-10-CM | POA: Insufficient documentation

## 2021-05-24 DIAGNOSIS — Z992 Dependence on renal dialysis: Secondary | ICD-10-CM | POA: Insufficient documentation

## 2021-05-24 DIAGNOSIS — N186 End stage renal disease: Secondary | ICD-10-CM | POA: Diagnosis not present

## 2021-05-24 DIAGNOSIS — N183 Chronic kidney disease, stage 3 unspecified: Secondary | ICD-10-CM | POA: Diagnosis not present

## 2021-05-24 LAB — CBC WITH DIFFERENTIAL/PLATELET
Abs Immature Granulocytes: 0.01 10*3/uL (ref 0.00–0.07)
Basophils Absolute: 0 10*3/uL (ref 0.0–0.1)
Basophils Relative: 0 %
Eosinophils Absolute: 0.6 10*3/uL — ABNORMAL HIGH (ref 0.0–0.5)
Eosinophils Relative: 10 %
HCT: 24 % — ABNORMAL LOW (ref 39.0–52.0)
Hemoglobin: 7.7 g/dL — ABNORMAL LOW (ref 13.0–17.0)
Immature Granulocytes: 0 %
Lymphocytes Relative: 30 %
Lymphs Abs: 1.7 10*3/uL (ref 0.7–4.0)
MCH: 30.3 pg (ref 26.0–34.0)
MCHC: 32.1 g/dL (ref 30.0–36.0)
MCV: 94.5 fL (ref 80.0–100.0)
Monocytes Absolute: 0.4 10*3/uL (ref 0.1–1.0)
Monocytes Relative: 8 %
Neutro Abs: 2.9 10*3/uL (ref 1.7–7.7)
Neutrophils Relative %: 52 %
Platelets: 108 10*3/uL — ABNORMAL LOW (ref 150–400)
RBC: 2.54 MIL/uL — ABNORMAL LOW (ref 4.22–5.81)
RDW: 15.2 % (ref 11.5–15.5)
WBC: 5.6 10*3/uL (ref 4.0–10.5)
nRBC: 0 % (ref 0.0–0.2)

## 2021-05-24 LAB — IRON AND TIBC
Iron: 65 ug/dL (ref 45–182)
Saturation Ratios: 25 % (ref 17.9–39.5)
TIBC: 262 ug/dL (ref 250–450)
UIBC: 197 ug/dL

## 2021-05-24 LAB — FERRITIN: Ferritin: 156 ng/mL (ref 24–336)

## 2021-05-24 MED ORDER — EPOETIN ALFA-EPBX 10000 UNIT/ML IJ SOLN
20000.0000 [IU] | Freq: Once | INTRAMUSCULAR | Status: AC
  Administered 2021-05-24: 20000 [IU] via SUBCUTANEOUS

## 2021-05-24 MED ORDER — EPOETIN ALFA-EPBX 40000 UNIT/ML IJ SOLN: 60000.0000 [IU] | Freq: Once | INTRAMUSCULAR | Status: DC

## 2021-05-24 MED ORDER — EPOETIN ALFA-EPBX 40000 UNIT/ML IJ SOLN
40000.0000 [IU] | Freq: Once | INTRAMUSCULAR | Status: AC
  Administered 2021-05-24: 40000 [IU] via SUBCUTANEOUS
  Filled 2021-05-24: qty 1

## 2021-05-24 MED ORDER — EPOETIN ALFA-EPBX 10000 UNIT/ML IJ SOLN: 20000.0000 [IU] | Freq: Once | INTRAMUSCULAR | Status: DC

## 2021-05-24 NOTE — Progress Notes (Signed)
Pt has no complaints at this time  

## 2021-05-25 ENCOUNTER — Encounter: Payer: Self-pay | Admitting: Oncology

## 2021-06-21 ENCOUNTER — Other Ambulatory Visit: Payer: Medicare Other

## 2021-06-21 ENCOUNTER — Ambulatory Visit: Payer: Medicare Other | Admitting: Oncology

## 2021-06-21 ENCOUNTER — Ambulatory Visit: Payer: Medicare Other

## 2021-07-05 ENCOUNTER — Inpatient Hospital Stay: Payer: Medicare Other | Attending: Internal Medicine

## 2021-07-05 ENCOUNTER — Inpatient Hospital Stay: Payer: Medicare Other

## 2021-07-05 ENCOUNTER — Other Ambulatory Visit: Payer: Self-pay

## 2021-07-05 VITALS — BP 138/65 | HR 54

## 2021-07-05 DIAGNOSIS — N184 Chronic kidney disease, stage 4 (severe): Secondary | ICD-10-CM | POA: Insufficient documentation

## 2021-07-05 DIAGNOSIS — N183 Chronic kidney disease, stage 3 unspecified: Secondary | ICD-10-CM

## 2021-07-05 DIAGNOSIS — D631 Anemia in chronic kidney disease: Secondary | ICD-10-CM | POA: Insufficient documentation

## 2021-07-05 LAB — CBC
HCT: 24.1 % — ABNORMAL LOW (ref 39.0–52.0)
Hemoglobin: 7.8 g/dL — ABNORMAL LOW (ref 13.0–17.0)
MCH: 30.4 pg (ref 26.0–34.0)
MCHC: 32.4 g/dL (ref 30.0–36.0)
MCV: 93.8 fL (ref 80.0–100.0)
Platelets: 116 10*3/uL — ABNORMAL LOW (ref 150–400)
RBC: 2.57 MIL/uL — ABNORMAL LOW (ref 4.22–5.81)
RDW: 14.6 % (ref 11.5–15.5)
WBC: 6.5 10*3/uL (ref 4.0–10.5)
nRBC: 0 % (ref 0.0–0.2)

## 2021-07-05 MED ORDER — EPOETIN ALFA-EPBX 10000 UNIT/ML IJ SOLN
20000.0000 [IU] | Freq: Once | INTRAMUSCULAR | Status: AC
  Administered 2021-07-05: 20000 [IU] via SUBCUTANEOUS
  Filled 2021-07-05: qty 2

## 2021-07-05 MED ORDER — EPOETIN ALFA-EPBX 40000 UNIT/ML IJ SOLN
40000.0000 [IU] | Freq: Once | INTRAMUSCULAR | Status: AC
  Administered 2021-07-05: 40000 [IU] via SUBCUTANEOUS
  Filled 2021-07-05: qty 1

## 2021-07-05 MED ORDER — EPOETIN ALFA-EPBX 40000 UNIT/ML IJ SOLN: 60000.0000 [IU] | Freq: Once | INTRAMUSCULAR | Status: DC

## 2021-08-16 ENCOUNTER — Other Ambulatory Visit: Payer: Self-pay

## 2021-08-16 ENCOUNTER — Inpatient Hospital Stay: Payer: Medicare Other | Attending: Internal Medicine

## 2021-08-16 ENCOUNTER — Inpatient Hospital Stay: Payer: Medicare Other

## 2021-08-16 VITALS — BP 153/69 | HR 50

## 2021-08-16 DIAGNOSIS — D631 Anemia in chronic kidney disease: Secondary | ICD-10-CM | POA: Insufficient documentation

## 2021-08-16 DIAGNOSIS — D696 Thrombocytopenia, unspecified: Secondary | ICD-10-CM | POA: Diagnosis not present

## 2021-08-16 DIAGNOSIS — N184 Chronic kidney disease, stage 4 (severe): Secondary | ICD-10-CM | POA: Insufficient documentation

## 2021-08-16 LAB — CBC
HCT: 24.9 % — ABNORMAL LOW (ref 39.0–52.0)
Hemoglobin: 7.9 g/dL — ABNORMAL LOW (ref 13.0–17.0)
MCH: 29.9 pg (ref 26.0–34.0)
MCHC: 31.7 g/dL (ref 30.0–36.0)
MCV: 94.3 fL (ref 80.0–100.0)
Platelets: 101 10*3/uL — ABNORMAL LOW (ref 150–400)
RBC: 2.64 MIL/uL — ABNORMAL LOW (ref 4.22–5.81)
RDW: 15.2 % (ref 11.5–15.5)
WBC: 6.9 10*3/uL (ref 4.0–10.5)
nRBC: 0 % (ref 0.0–0.2)

## 2021-08-16 MED ORDER — EPOETIN ALFA-EPBX 40000 UNIT/ML IJ SOLN
40000.0000 [IU] | Freq: Once | INTRAMUSCULAR | Status: AC
  Administered 2021-08-16: 11:00:00 40000 [IU] via SUBCUTANEOUS
  Filled 2021-08-16: qty 1

## 2021-08-16 MED ORDER — EPOETIN ALFA-EPBX 40000 UNIT/ML IJ SOLN: 60000.0000 [IU] | Freq: Once | INTRAMUSCULAR | Status: DC

## 2021-08-16 MED ORDER — EPOETIN ALFA-EPBX 10000 UNIT/ML IJ SOLN
20000.0000 [IU] | Freq: Once | INTRAMUSCULAR | Status: AC
  Administered 2021-08-16: 11:00:00 20000 [IU] via SUBCUTANEOUS

## 2021-09-02 ENCOUNTER — Encounter: Payer: Self-pay | Admitting: Oncology

## 2021-09-02 NOTE — Telephone Encounter (Signed)
error 

## 2021-09-20 ENCOUNTER — Ambulatory Visit (INDEPENDENT_AMBULATORY_CARE_PROVIDER_SITE_OTHER): Payer: Medicare Other

## 2021-09-20 ENCOUNTER — Encounter (INDEPENDENT_AMBULATORY_CARE_PROVIDER_SITE_OTHER): Payer: Self-pay | Admitting: Vascular Surgery

## 2021-09-20 ENCOUNTER — Other Ambulatory Visit (INDEPENDENT_AMBULATORY_CARE_PROVIDER_SITE_OTHER): Payer: Self-pay | Admitting: Nurse Practitioner

## 2021-09-20 ENCOUNTER — Ambulatory Visit (INDEPENDENT_AMBULATORY_CARE_PROVIDER_SITE_OTHER): Payer: Medicare Other | Admitting: Vascular Surgery

## 2021-09-20 ENCOUNTER — Other Ambulatory Visit: Payer: Self-pay | Admitting: *Deleted

## 2021-09-20 ENCOUNTER — Other Ambulatory Visit: Payer: Self-pay

## 2021-09-20 VITALS — BP 154/71 | HR 51 | Resp 16 | Ht 73.0 in | Wt 190.0 lb

## 2021-09-20 DIAGNOSIS — N185 Chronic kidney disease, stage 5: Secondary | ICD-10-CM

## 2021-09-20 DIAGNOSIS — D631 Anemia in chronic kidney disease: Secondary | ICD-10-CM

## 2021-09-20 DIAGNOSIS — I10 Essential (primary) hypertension: Secondary | ICD-10-CM

## 2021-09-20 DIAGNOSIS — E782 Mixed hyperlipidemia: Secondary | ICD-10-CM

## 2021-09-20 DIAGNOSIS — E1122 Type 2 diabetes mellitus with diabetic chronic kidney disease: Secondary | ICD-10-CM

## 2021-09-20 NOTE — Assessment & Plan Note (Signed)
Likely an underlying cause of his renal failure and blood glucose control important in reducing the progression of atherosclerotic disease. Also, involved in wound healing. On appropriate medications.  

## 2021-09-20 NOTE — Progress Notes (Signed)
Patient ID: [name] [name], male   DOB: 24-Oct-1940, 81 y.o.   MRN: [id number]  Chief Complaint  Patient presents with   New Patient (Initial Visit)    Ref Holley Raring vein mapping    HPI [name] [name] is a 81 y.o. male.  I am asked to see the patient by Dr. Holley Raring for evaluation of vein mapping for permanent dialysis access options.  The patient has stage V chronic kidney disease with a most recent GFR of about 11.  His nephrologist says he will need to start dialysis in the very near future.  He reports some swelling and some lethargy but overall continues to do fairly well.  He says he has put off dialysis for about 6 months, but knows he needs to start soon.  His vein mapping today showed small cephalic and basilic veins bilaterally that did not appear as if they would be adequate for fistula creation by duplex criteria.  Arterial flow was normal bilaterally.     Past Medical History:  Diagnosis Date   Anemia    Arthritis    Chronic kidney disease    Stage 4 CKD   Diabetes mellitus without complication (HCC)    GERD (gastroesophageal reflux disease)    h/o   Gout    Hyperlipidemia, mixed    Hypertension    Sleep apnea    h/o no cpap    Past Surgical History:  Procedure Laterality Date   APPENDECTOMY     Bunion Removal Right    COLONOSCOPY WITH PROPOFOL N/A 06/14/2015   Procedure: COLONOSCOPY WITH PROPOFOL;  Surgeon: Lollie Sails, MD;  Location: [facility] ENDOSCOPY;  Service: Endoscopy;  Laterality: N/A;   HERNIA REPAIR     abd   PROSTATE BIOPSY N/A 03/27/2017   Procedure: PROSTATE BIOPSY-URO NAV;  Surgeon: Royston Cowper, MD;  Location: [facility] ORS;  Service: Urology;  Laterality: N/A;   PROSTATE BIOPSY N/A 09/11/2019   Procedure: PROSTATE BIOPSY Pearson Forster;  Surgeon: Royston Cowper, MD;  Location: [facility] ORS;  Service: Urology;  Laterality: N/A;   RETINAL DETACHMENT SURGERY Left    TRANSURETHRAL RESECTION OF PROSTATE       Family History  Problem Relation Age of  Onset   Leukemia Father    Leukemia Brother   No bleeding or clotting disorders   Social History   Tobacco Use   Smoking status: Never   Smokeless tobacco: Never  Vaping Use   Vaping Use: Never used  Substance Use Topics   Alcohol use: Yes    Comment: rare   Drug use: No     Allergies  Allergen Reactions   Fish Allergy Swelling   Ciprofloxacin     Unknown reaction   Doxazosin Other (See Comments)    unknown    Current Outpatient Medications  Medication Sig Dispense Refill   acetaminophen (TYLENOL) 500 MG tablet Take 1,000 mg by mouth every 6 (six) hours as needed for moderate pain or headache.      amLODipine (NORVASC) 10 MG tablet Take 10 mg by mouth at bedtime.      aspirin EC 81 MG tablet Take 81 mg by mouth daily.     bicalutamide (CASODEX) 50 MG tablet Take 50 mg by mouth daily.     enalapril (VASOTEC) 20 MG tablet Take 20 mg by mouth 2 (two) times daily.      fluticasone (FLONASE) 50 MCG/ACT nasal spray Place 2 sprays into both nostrils daily as needed for allergies.  glucose blood test strip USE TO CHECK FASTING BLOOD SUGAR ONCE DAILY     hydrALAZINE (APRESOLINE) 100 MG tablet Take 100 mg by mouth 3 (three) times daily.     Insulin Pen Needle 32G X 5 MM MISC Use 1 Units once daily. Dx: E11.9     Ketotifen Fumarate (ALLERGY EYE DROPS OP) Place 1-2 drops into both eyes daily as needed (allergies/irritation).     lovastatin (MEVACOR) 20 MG tablet Take 20 mg by mouth at bedtime.     LUPRON DEPOT, 38-MONTH, 45 MG injection Inject 45 mg into the muscle every 6 (six) months.     metoprolol (LOPRESSOR) 50 MG tablet Take 50 mg by mouth 2 (two) times daily.     sodium bicarbonate 650 MG tablet Take 650 mg by mouth 2 (two) times daily.      ULORIC 40 MG tablet Take 40 mg by mouth daily.      No current facility-administered medications for this visit.      REVIEW OF SYSTEMS (Negative unless checked)  Constitutional: [] Weight loss  [] Fever  [] Chills Cardiac:  [] Chest pain   [] Chest pressure   [] Palpitations   [] Shortness of breath when laying flat   [] Shortness of breath at rest   [] Shortness of breath with exertion. Vascular:  [] Pain in legs with walking   [] Pain in legs at rest   [] Pain in legs when laying flat   [] Claudication   [] Pain in feet when walking  [] Pain in feet at rest  [] Pain in feet when laying flat   [] History of DVT   [] Phlebitis   [x] Swelling in legs   [] Varicose veins   [] Non-healing ulcers Pulmonary:   [] Uses home oxygen   [] Productive cough   [] Hemoptysis   [] Wheeze  [] COPD   [] Asthma Neurologic:  [] Dizziness  [] Blackouts   [] Seizures   [] History of stroke   [] History of TIA  [] Aphasia   [] Temporary blindness   [] Dysphagia   [] Weakness or numbness in arms   [] Weakness or numbness in legs Musculoskeletal:  [] Arthritis   [] Joint swelling   [] Joint pain   [] Low back pain Hematologic:  [] Easy bruising  [] Easy bleeding   [] Hypercoagulable state   [x] Anemic  [] Hepatitis Gastrointestinal:  [] Blood in stool   [] Vomiting blood  [] Gastroesophageal reflux/heartburn   [] Abdominal pain Genitourinary:  [x] Chronic kidney disease   [] Difficult urination  [] Frequent urination  [] Burning with urination   [] Hematuria Skin:  [] Rashes   [] Ulcers   [] Wounds Psychological:  [] History of anxiety   []  History of major depression.    Physical Exam BP (!) 154/71 (BP Location: Right Arm)    Pulse (!) 51    Resp 16    Ht 6\' 1"  (1.854 m)    Wt 190 lb (86.2 kg)    BMI 25.07 kg/m  Gen:  WD/WN, NAD.  Appears younger than stated age Head: [place]/AT, No temporalis wasting.  Ear/Nose/Throat: Hearing grossly intact, nares w/o erythema or drainage, oropharynx w/o Erythema/Exudate Eyes: Conjunctiva clear, sclera non-icteric  Neck: trachea midline.  No JVD.  Pulmonary:  Good air movement, respirations not labored, no use of accessory muscles  Cardiac: RRR, no JVD Vascular:  Vessel Right Left  Radial Palpable Palpable                                    Gastrointestinal:. No masses, surgical incisions, or scars. Musculoskeletal: M/S 5/5 throughout.  Extremities without ischemic  changes.  No deformity or atrophy.  Mild lower extremity edema. Neurologic: Sensation grossly intact in extremities.  Symmetrical.  Speech is fluent. Motor exam as listed above. Psychiatric: Judgment intact, Mood & affect appropriate for pt's clinical situation. Dermatologic: No rashes or ulcers noted.  No cellulitis or open wounds.    Radiology No results found.  Labs Recent Results (from the past 2160 hour(s))  CBC     Status: Abnormal   Collection Time: 07/05/21 10:20 AM  Result Value Ref Range   WBC 6.5 4.0 - 10.5 K/uL   RBC 2.57 (L) 4.22 - 5.81 MIL/uL   Hemoglobin 7.8 (L) 13.0 - 17.0 g/dL   HCT 24.1 (L) 39.0 - 52.0 %   MCV 93.8 80.0 - 100.0 fL   MCH 30.4 26.0 - 34.0 pg   MCHC 32.4 30.0 - 36.0 g/dL   RDW 14.6 11.5 - 15.5 %   Platelets 116 (L) 150 - 400 K/uL   nRBC 0.0 0.0 - 0.2 %    Comment: Performed at [facility], [address] [place], [place] [address]  CBC     Status: Abnormal   Collection Time: 08/16/21 10:54 AM  Result Value Ref Range   WBC 6.9 4.0 - 10.5 K/uL   RBC 2.64 (L) 4.22 - 5.81 MIL/uL   Hemoglobin 7.9 (L) 13.0 - 17.0 g/dL   HCT 24.9 (L) 39.0 - 52.0 %   MCV 94.3 80.0 - 100.0 fL   MCH 29.9 26.0 - 34.0 pg   MCHC 31.7 30.0 - 36.0 g/dL   RDW 15.2 11.5 - 15.5 %   Platelets 101 (L) 150 - 400 K/uL   nRBC 0.0 0.0 - 0.2 %    Comment: Performed at [facility], [address] [place], [place] [address]    Assessment/Plan:  Chronic kidney disease, stage V ([place]) His vein mapping today showed small cephalic and basilic veins bilaterally that did not appear as if they would be adequate for fistula creation by duplex criteria.  Arterial flow was normal bilaterally.   In discussions with his nephrologist, he will need to start dialysis sooner than any access will be ready to use from his extremity.  We will plan on  placing a PermCath in the next week or so and also get him on the schedule for a left arm AV graft.  We discussed that if we did find a usable vein at the time of arm exploration, fistula will be created.  Risks and benefits were discussed.  He is right-hand dominant, so we will plan on a left arm access.  Benign essential hypertension Likely an underlying cause of his renal failure and blood pressure control important in reducing the progression of atherosclerotic disease. On appropriate oral medications.   Type 2 diabetes mellitus with chronic kidney disease ([place]) Likely an underlying cause of his renal failure and blood glucose control important in reducing the progression of atherosclerotic disease. Also, involved in wound healing. On appropriate medications.   Mixed hyperlipidemia lipid control important in reducing the progression of atherosclerotic disease. Continue statin therapy       [name] 09/20/2021, 10:55 AM   This note was created with Dragon medical transcription system.  Any errors from dictation are unintentional.

## 2021-09-20 NOTE — Assessment & Plan Note (Signed)
lipid control important in reducing the progression of atherosclerotic disease. Continue statin therapy  

## 2021-09-20 NOTE — Patient Instructions (Signed)
Vascular Access for Hemodialysis Vascular access is a connection to your blood vessels that allows blood to be removed from the body and returned to the body during hemodialysis. Hemodialysis is a procedure in which a machine and a filter (dialyzer) outside of the body are used to clean the blood of a person whose kidneys are no longer working properly. Types of vascular accesses     There are three types of vascular accesses. Arteriovenous fistula (AVF). This type of access is created when an artery and a vein, often in the arm, are connected through surgery. Blood in the artery flows directly into the vein, causing it to get larger over time. This makes it easier for the vein to be used for hemodialysis. An AVF usually takes 1-6 months to develop after surgery. Arteriovenous graft (AVG). This type is created when a tube is used to connect an artery and a vein in the arm. An AVG can usually be used within 2-3 weeks of surgery. Central venous catheter (CVC). To create this type, a long, thin tube is placed in a large vein in your neck, chest, or groin. A venous catheter can be used right away. Which vascular access is right for me? The type of access that is best for you depends on the size and strength of your veins, your age, and any other health problems that you have, such as diabetes. An ultrasound test may be used to check your veins to help make this decision. Sometimes, only one type of access is available for you. Your health care provider will help you determine which type of access is best for you. AVF An AVF is usually the preferred type of vascular access. It can last several years and is less likely than the other types of accesses to become infected or to cause a blood clot within a blood vessel (thrombosis). However, an AVF may not work for you if your veins are not the right size or if your AVF does not develop properly. AVG An AVG may be used if an AVF is not right for you. An AV  graft provides a solution for small or weak veins. An AV graft can be used as soon as 2-4 weeks after placement. AV graft surgery is usually done on an outpatient basis, under local anesthetic, allowing for a rapid and easy recovery. CVC A CVC may be used if: A fistula or graft cannot be created. You need short-term access for hemodialysis before a fistula or graft has developed. Kidney failure is sudden (acute) and likely to improve without the need for long-term dialysis. Common problems with vascular access Some problems may occur with vascular access. Problems are more likely to occur with a CVC and less likely to occur with an AVF. Problems include: Thrombosis. This can lead to a narrowing of a blood vessel (stenosis). If thrombosis occurs frequently, another access site may be created as a backup. Infection. How is the vascular access used during hemodialysis?  If the access is a fistula or graft: Two needles will be inserted through the skin into the access before each hemodialysis session. Blood will leave the body through one of the needles and will travel through a tube to the hemodialysis machine and dialyzer. Then, blood will flow through another tube and will return to the body through the second needle. If the access is a catheter: Blood will leave the body through one tube where it will be filtered by the hemodialysis machine and dialyzer. Then,  the filtered blood will be returned to the body through the other tube. Follow these instructions at home: If you have a graft or fistula: Feel for a thrill each day. A thrill is a vibration that is felt over the graft or fistula. You will be taught how to feel for the thrill. The presence of a thrill and a noise that is heard with a stethoscope (bruit) indicates that the access is working. If you do not feel a thrill, call your health care provider, as the access may be clotted. Keep your arm straight and raised (elevated) above your heart  while the access site is healing. After your vascular access site heals, you may freely use the arm where the access is located. Be sure to: Avoid pressure on the arm. Avoid lifting heavy objects with the arm. Avoid sleeping on the arm. Avoid wearing tight-sleeved shirts or jewelry around the graft or fistula. Do not allow blood pressure monitoring or needle punctures on the side where the graft or fistula is located. With permission from your health care provider, you may do exercises to help with blood flow through a fistula. Clean and dry your access site according to directions from your health care provider. If you have a venous catheter: Keep the insertion site clean and dry at all times. If you are told you can shower after the site heals, use a protective covering over the catheter to keep it dry. Follow directions from your health care provider for bandage (dressing) changes. Ask your health care provider what activities are safe for you. You may be told to avoid lifting or making repetitive arm movements on the side with the catheter. General instructions Wear a medical alert bracelet. In case of an emergency, this bracelet tells health care providers that you receive dialysis and allows them to care for your veins properly. Contact a health care provider if: Swelling around the graft or fistula gets worse. You develop new pain. Your catheter gets damaged. You have pus or other fluid at the vascular access site. You have a fever or chills. Get help right away if: You have these symptoms on your fistula hand: Pain. Numbness. Unusual pale skin color, or blue fingers or sores at the tips of your fingers. You develop redness or red streaking on the skin around the vascular access. You have bleeding at the vascular access that cannot be easily controlled. Your catheter gets pulled out of place. You feel your heart racing or skipping beats, or you have chest pain. These symptoms  may represent a serious problem that is an emergency. Do not wait to see if the symptoms will go away. Get medical help right away. Call your local emergency services (911 in the [place] Do not drive yourself to the hospital. Summary A vascular access is a connection to the blood inside your blood vessels that allows blood to be easily removed from your body and returned to your body during kidney dialysis (hemodialysis). There are three types of vascular accesses.The type of access that is best for you depends on the size and strength of your veins, your age, and any other health problems that you have, such as diabetes. A fistula is usually the preferred type of access, although it is not an option for everyone. It can last several years and is less likely than the other types of accesses to become infected or to cause a blood clot within a blood vessel (thrombosis). Wear a medical alert bracelet. In case  of an emergency, this tells health care providers that you receive dialysis. This information is not intended to replace advice given to you by your health care provider. Make sure you discuss any questions you have with your health care provider. Document Revised: 03/17/2020 Document Reviewed: 03/17/2020 Elsevier Patient Education  [place].

## 2021-09-20 NOTE — Assessment & Plan Note (Signed)
Likely an underlying cause of his renal failure and blood pressure control important in reducing the progression of atherosclerotic disease. On appropriate oral medications.  

## 2021-09-20 NOTE — Assessment & Plan Note (Signed)
His vein mapping today showed small cephalic and basilic veins bilaterally that did not appear as if they would be adequate for fistula creation by duplex criteria.  Arterial flow was normal bilaterally.   In discussions with his nephrologist, he will need to start dialysis sooner than any access will be ready to use from his extremity.  We will plan on placing a PermCath in the next week or so and also get him on the schedule for a left arm AV graft.  We discussed that if we did find a usable vein at the time of arm exploration, fistula will be created.  Risks and benefits were discussed.  He is right-hand dominant, so we will plan on a left arm access.

## 2021-09-20 NOTE — H&P (View-Only) (Signed)
Patient ID: [name] [name], male   DOB: Sep 27, 1940, 81 y.o.   MRN: [id number]  Chief Complaint  Patient presents with   New Patient (Initial Visit)    Ref Holley Raring vein mapping    HPI [name] [name] is a 81 y.o. male.  I am asked to see the patient by Dr. Holley Raring for evaluation of vein mapping for permanent dialysis access options.  The patient has stage V chronic kidney disease with a most recent GFR of about 11.  His nephrologist says he will need to start dialysis in the very near future.  He reports some swelling and some lethargy but overall continues to do fairly well.  He says he has put off dialysis for about 6 months, but knows he needs to start soon.  His vein mapping today showed small cephalic and basilic veins bilaterally that did not appear as if they would be adequate for fistula creation by duplex criteria.  Arterial flow was normal bilaterally.     Past Medical History:  Diagnosis Date   Anemia    Arthritis    Chronic kidney disease    Stage 4 CKD   Diabetes mellitus without complication (HCC)    GERD (gastroesophageal reflux disease)    h/o   Gout    Hyperlipidemia, mixed    Hypertension    Sleep apnea    h/o no cpap    Past Surgical History:  Procedure Laterality Date   APPENDECTOMY     Bunion Removal Right    COLONOSCOPY WITH PROPOFOL N/A 06/14/2015   Procedure: COLONOSCOPY WITH PROPOFOL;  Surgeon: Lollie Sails, MD;  Location: [facility] ENDOSCOPY;  Service: Endoscopy;  Laterality: N/A;   HERNIA REPAIR     abd   PROSTATE BIOPSY N/A 03/27/2017   Procedure: PROSTATE BIOPSY-URO NAV;  Surgeon: Royston Cowper, MD;  Location: [facility] ORS;  Service: Urology;  Laterality: N/A;   PROSTATE BIOPSY N/A 09/11/2019   Procedure: PROSTATE BIOPSY Pearson Forster;  Surgeon: Royston Cowper, MD;  Location: [facility] ORS;  Service: Urology;  Laterality: N/A;   RETINAL DETACHMENT SURGERY Left    TRANSURETHRAL RESECTION OF PROSTATE       Family History  Problem Relation Age of  Onset   Leukemia Father    Leukemia Brother   No bleeding or clotting disorders   Social History   Tobacco Use   Smoking status: Never   Smokeless tobacco: Never  Vaping Use   Vaping Use: Never used  Substance Use Topics   Alcohol use: Yes    Comment: rare   Drug use: No     Allergies  Allergen Reactions   Fish Allergy Swelling   Ciprofloxacin     Unknown reaction   Doxazosin Other (See Comments)    unknown    Current Outpatient Medications  Medication Sig Dispense Refill   acetaminophen (TYLENOL) 500 MG tablet Take 1,000 mg by mouth every 6 (six) hours as needed for moderate pain or headache.      amLODipine (NORVASC) 10 MG tablet Take 10 mg by mouth at bedtime.      aspirin EC 81 MG tablet Take 81 mg by mouth daily.     bicalutamide (CASODEX) 50 MG tablet Take 50 mg by mouth daily.     enalapril (VASOTEC) 20 MG tablet Take 20 mg by mouth 2 (two) times daily.      fluticasone (FLONASE) 50 MCG/ACT nasal spray Place 2 sprays into both nostrils daily as needed for allergies.  glucose blood test strip USE TO CHECK FASTING BLOOD SUGAR ONCE DAILY     hydrALAZINE (APRESOLINE) 100 MG tablet Take 100 mg by mouth 3 (three) times daily.     Insulin Pen Needle 32G X 5 MM MISC Use 1 Units once daily. Dx: E11.9     Ketotifen Fumarate (ALLERGY EYE DROPS OP) Place 1-2 drops into both eyes daily as needed (allergies/irritation).     lovastatin (MEVACOR) 20 MG tablet Take 20 mg by mouth at bedtime.     LUPRON DEPOT, 69-MONTH, 45 MG injection Inject 45 mg into the muscle every 6 (six) months.     metoprolol (LOPRESSOR) 50 MG tablet Take 50 mg by mouth 2 (two) times daily.     sodium bicarbonate 650 MG tablet Take 650 mg by mouth 2 (two) times daily.      ULORIC 40 MG tablet Take 40 mg by mouth daily.      No current facility-administered medications for this visit.      REVIEW OF SYSTEMS (Negative unless checked)  Constitutional: [] Weight loss  [] Fever  [] Chills Cardiac:  [] Chest pain   [] Chest pressure   [] Palpitations   [] Shortness of breath when laying flat   [] Shortness of breath at rest   [] Shortness of breath with exertion. Vascular:  [] Pain in legs with walking   [] Pain in legs at rest   [] Pain in legs when laying flat   [] Claudication   [] Pain in feet when walking  [] Pain in feet at rest  [] Pain in feet when laying flat   [] History of DVT   [] Phlebitis   [x] Swelling in legs   [] Varicose veins   [] Non-healing ulcers Pulmonary:   [] Uses home oxygen   [] Productive cough   [] Hemoptysis   [] Wheeze  [] COPD   [] Asthma Neurologic:  [] Dizziness  [] Blackouts   [] Seizures   [] History of stroke   [] History of TIA  [] Aphasia   [] Temporary blindness   [] Dysphagia   [] Weakness or numbness in arms   [] Weakness or numbness in legs Musculoskeletal:  [] Arthritis   [] Joint swelling   [] Joint pain   [] Low back pain Hematologic:  [] Easy bruising  [] Easy bleeding   [] Hypercoagulable state   [x] Anemic  [] Hepatitis Gastrointestinal:  [] Blood in stool   [] Vomiting blood  [] Gastroesophageal reflux/heartburn   [] Abdominal pain Genitourinary:  [x] Chronic kidney disease   [] Difficult urination  [] Frequent urination  [] Burning with urination   [] Hematuria Skin:  [] Rashes   [] Ulcers   [] Wounds Psychological:  [] History of anxiety   []  History of major depression.    Physical Exam BP (!) 154/71 (BP Location: Right Arm)    Pulse (!) 51    Resp 16    Ht 6\' 1"  (1.854 m)    Wt 190 lb (86.2 kg)    BMI 25.07 kg/m  Gen:  WD/WN, NAD.  Appears younger than stated age Head: /AT, No temporalis wasting.  Ear/Nose/Throat: Hearing grossly intact, nares w/o erythema or drainage, oropharynx w/o Erythema/Exudate Eyes: Conjunctiva clear, sclera non-icteric  Neck: trachea midline.  No JVD.  Pulmonary:  Good air movement, respirations not labored, no use of accessory muscles  Cardiac: RRR, no JVD Vascular:  Vessel Right Left  Radial Palpable Palpable                                    Gastrointestinal:. No masses, surgical incisions, or scars. Musculoskeletal: M/S 5/5 throughout.  Extremities without ischemic  changes.  No deformity or atrophy.  Mild lower extremity edema. Neurologic: Sensation grossly intact in extremities.  Symmetrical.  Speech is fluent. Motor exam as listed above. Psychiatric: Judgment intact, Mood & affect appropriate for pt's clinical situation. Dermatologic: No rashes or ulcers noted.  No cellulitis or open wounds.    Radiology No results found.  Labs Recent Results (from the past 2160 hour(s))  CBC     Status: Abnormal   Collection Time: 07/05/21 10:20 AM  Result Value Ref Range   WBC 6.5 4.0 - 10.5 K/uL   RBC 2.57 (L) 4.22 - 5.81 MIL/uL   Hemoglobin 7.8 (L) 13.0 - 17.0 g/dL   HCT 24.1 (L) 39.0 - 52.0 %   MCV 93.8 80.0 - 100.0 fL   MCH 30.4 26.0 - 34.0 pg   MCHC 32.4 30.0 - 36.0 g/dL   RDW 14.6 11.5 - 15.5 %   Platelets 116 (L) 150 - 400 K/uL   nRBC 0.0 0.0 - 0.2 %    Comment: Performed at [facility], [address] [place], [place] [address]  CBC     Status: Abnormal   Collection Time: 08/16/21 10:54 AM  Result Value Ref Range   WBC 6.9 4.0 - 10.5 K/uL   RBC 2.64 (L) 4.22 - 5.81 MIL/uL   Hemoglobin 7.9 (L) 13.0 - 17.0 g/dL   HCT 24.9 (L) 39.0 - 52.0 %   MCV 94.3 80.0 - 100.0 fL   MCH 29.9 26.0 - 34.0 pg   MCHC 31.7 30.0 - 36.0 g/dL   RDW 15.2 11.5 - 15.5 %   Platelets 101 (L) 150 - 400 K/uL   nRBC 0.0 0.0 - 0.2 %    Comment: Performed at [facility], [address], [place], [place] [address]    Assessment/Plan:  Chronic kidney disease, stage V ([place]) His vein mapping today showed small cephalic and basilic veins bilaterally that did not appear as if they would be adequate for fistula creation by duplex criteria.  Arterial flow was normal bilaterally.   In discussions with his nephrologist, he will need to start dialysis sooner than any access will be ready to use from his extremity.  We will plan on  placing a PermCath in the next week or so and also get him on the schedule for a left arm AV graft.  We discussed that if we did find a usable vein at the time of arm exploration, fistula will be created.  Risks and benefits were discussed.  He is right-hand dominant, so we will plan on a left arm access.  Benign essential hypertension Likely an underlying cause of his renal failure and blood pressure control important in reducing the progression of atherosclerotic disease. On appropriate oral medications.   Type 2 diabetes mellitus with chronic kidney disease ([place]) Likely an underlying cause of his renal failure and blood glucose control important in reducing the progression of atherosclerotic disease. Also, involved in wound healing. On appropriate medications.   Mixed hyperlipidemia lipid control important in reducing the progression of atherosclerotic disease. Continue statin therapy       [name] 09/20/2021, 10:55 AM   This note was created with Dragon medical transcription system.  Any errors from dictation are unintentional.

## 2021-09-27 ENCOUNTER — Other Ambulatory Visit: Payer: Self-pay

## 2021-09-27 ENCOUNTER — Inpatient Hospital Stay: Payer: Medicare Other | Attending: Internal Medicine

## 2021-09-27 VITALS — BP 159/74 | HR 52

## 2021-09-27 DIAGNOSIS — D631 Anemia in chronic kidney disease: Secondary | ICD-10-CM | POA: Diagnosis present

## 2021-09-27 DIAGNOSIS — N184 Chronic kidney disease, stage 4 (severe): Secondary | ICD-10-CM | POA: Diagnosis not present

## 2021-09-27 DIAGNOSIS — N183 Chronic kidney disease, stage 3 unspecified: Secondary | ICD-10-CM

## 2021-09-27 DIAGNOSIS — D696 Thrombocytopenia, unspecified: Secondary | ICD-10-CM | POA: Diagnosis present

## 2021-09-27 LAB — CBC WITH DIFFERENTIAL/PLATELET
Abs Immature Granulocytes: 0.02 10*3/uL (ref 0.00–0.07)
Basophils Absolute: 0 10*3/uL (ref 0.0–0.1)
Basophils Relative: 1 %
Eosinophils Absolute: 0.6 10*3/uL — ABNORMAL HIGH (ref 0.0–0.5)
Eosinophils Relative: 7 %
HCT: 26.9 % — ABNORMAL LOW (ref 39.0–52.0)
Hemoglobin: 8.4 g/dL — ABNORMAL LOW (ref 13.0–17.0)
Immature Granulocytes: 0 %
Lymphocytes Relative: 36 %
Lymphs Abs: 2.7 10*3/uL (ref 0.7–4.0)
MCH: 29.2 pg (ref 26.0–34.0)
MCHC: 31.2 g/dL (ref 30.0–36.0)
MCV: 93.4 fL (ref 80.0–100.0)
Monocytes Absolute: 0.5 10*3/uL (ref 0.1–1.0)
Monocytes Relative: 6 %
Neutro Abs: 3.7 10*3/uL (ref 1.7–7.7)
Neutrophils Relative %: 50 %
Platelets: 115 10*3/uL — ABNORMAL LOW (ref 150–400)
RBC: 2.88 MIL/uL — ABNORMAL LOW (ref 4.22–5.81)
RDW: 15 % (ref 11.5–15.5)
WBC: 7.4 10*3/uL (ref 4.0–10.5)
nRBC: 0 % (ref 0.0–0.2)

## 2021-09-27 LAB — FERRITIN: Ferritin: 139 ng/mL (ref 24–336)

## 2021-09-27 LAB — IRON AND TIBC
Iron: 83 ug/dL (ref 45–182)
Saturation Ratios: 32 % (ref 17.9–39.5)
TIBC: 263 ug/dL (ref 250–450)
UIBC: 180 ug/dL

## 2021-09-27 MED ORDER — EPOETIN ALFA-EPBX 40000 UNIT/ML IJ SOLN: 60000.0000 [IU] | Freq: Once | INTRAMUSCULAR | Status: DC

## 2021-09-27 MED ORDER — EPOETIN ALFA-EPBX 40000 UNIT/ML IJ SOLN
40000.0000 [IU] | Freq: Once | INTRAMUSCULAR | Status: AC
  Administered 2021-09-27: 40000 [IU] via SUBCUTANEOUS
  Filled 2021-09-27: qty 1

## 2021-09-27 MED ORDER — EPOETIN ALFA-EPBX 10000 UNIT/ML IJ SOLN
20000.0000 [IU] | Freq: Once | INTRAMUSCULAR | Status: AC
  Administered 2021-09-27: 20000 [IU] via SUBCUTANEOUS
  Filled 2021-09-27: qty 2

## 2021-09-28 ENCOUNTER — Telehealth (INDEPENDENT_AMBULATORY_CARE_PROVIDER_SITE_OTHER): Payer: Self-pay

## 2021-09-28 NOTE — Telephone Encounter (Signed)
Spoke with the patient and he is scheduled with Dr. Lucky Cowboy for a permcath insertion on 10/03/21 with a 9:15 am arrival time to the MM. Pre-procedure instructions were discussed and will be mailed.

## 2021-10-03 ENCOUNTER — Ambulatory Visit
Admission: RE | Admit: 2021-10-03 | Discharge: 2021-10-03 | Disposition: A | Discharge status: Home / Self Care | Payer: Medicare Other | Source: Ambulatory Visit | Attending: Vascular Surgery | Admitting: Vascular Surgery

## 2021-10-03 ENCOUNTER — Encounter
Admission: RE | Disposition: A | Discharge status: Home / Self Care | Payer: Self-pay | Source: Ambulatory Visit | Attending: Vascular Surgery

## 2021-10-03 ENCOUNTER — Other Ambulatory Visit: Payer: Self-pay

## 2021-10-03 ENCOUNTER — Encounter: Payer: Self-pay | Admitting: Vascular Surgery

## 2021-10-03 ENCOUNTER — Other Ambulatory Visit (INDEPENDENT_AMBULATORY_CARE_PROVIDER_SITE_OTHER): Payer: Self-pay | Admitting: Nurse Practitioner

## 2021-10-03 DIAGNOSIS — N185 Chronic kidney disease, stage 5: Secondary | ICD-10-CM

## 2021-10-03 DIAGNOSIS — E1122 Type 2 diabetes mellitus with diabetic chronic kidney disease: Secondary | ICD-10-CM | POA: Diagnosis not present

## 2021-10-03 DIAGNOSIS — I120 Hypertensive chronic kidney disease with stage 5 chronic kidney disease or end stage renal disease: Secondary | ICD-10-CM | POA: Insufficient documentation

## 2021-10-03 DIAGNOSIS — Z794 Long term (current) use of insulin: Secondary | ICD-10-CM | POA: Diagnosis not present

## 2021-10-03 DIAGNOSIS — E782 Mixed hyperlipidemia: Secondary | ICD-10-CM | POA: Diagnosis not present

## 2021-10-03 HISTORY — PX: DIALYSIS/PERMA CATHETER INSERTION: CATH118288

## 2021-10-03 SURGERY — DIALYSIS/PERMA CATHETER INSERTION
Anesthesia: Moderate Sedation

## 2021-10-03 MED ORDER — SODIUM CHLORIDE 0.9 % IV SOLN: INTRAVENOUS | Status: DC

## 2021-10-03 MED ORDER — MIDAZOLAM HCL 2 MG/2ML IJ SOLN
INTRAMUSCULAR | Status: DC | PRN
  Administered 2021-10-03 (×2): 1 mg via INTRAVENOUS

## 2021-10-03 MED ORDER — FENTANYL CITRATE (PF) 100 MCG/2ML IJ SOLN
INTRAMUSCULAR | Status: DC | PRN
  Administered 2021-10-03: 50 ug via INTRAVENOUS

## 2021-10-03 MED ORDER — FENTANYL CITRATE PF 50 MCG/ML IJ SOSY
PREFILLED_SYRINGE | INTRAMUSCULAR | Status: AC
  Filled 2021-10-03: qty 1

## 2021-10-03 MED ORDER — FAMOTIDINE 20 MG PO TABS: 40.0000 mg | ORAL_TABLET | Freq: Once | ORAL | Status: DC | PRN

## 2021-10-03 MED ORDER — DIPHENHYDRAMINE HCL 50 MG/ML IJ SOLN: 50.0000 mg | Freq: Once | INTRAMUSCULAR | Status: DC | PRN

## 2021-10-03 MED ORDER — MIDAZOLAM HCL 2 MG/2ML IJ SOLN
INTRAMUSCULAR | Status: AC
  Filled 2021-10-03: qty 2

## 2021-10-03 MED ORDER — ONDANSETRON HCL 4 MG/2ML IJ SOLN: 4.0000 mg | Freq: Four times a day (QID) | INTRAMUSCULAR | Status: DC | PRN

## 2021-10-03 MED ORDER — CEFAZOLIN SODIUM-DEXTROSE 1-4 GM/50ML-% IV SOLN
INTRAVENOUS | Status: AC
  Administered 2021-10-03: 1 g via INTRAVENOUS
  Filled 2021-10-03: qty 50

## 2021-10-03 MED ORDER — HYDROMORPHONE HCL 1 MG/ML IJ SOLN: 1.0000 mg | Freq: Once | INTRAMUSCULAR | Status: DC | PRN

## 2021-10-03 MED ORDER — CEFAZOLIN SODIUM-DEXTROSE 1-4 GM/50ML-% IV SOLN: 1.0000 g | Freq: Once | INTRAVENOUS | Status: AC

## 2021-10-03 MED ORDER — METHYLPREDNISOLONE SODIUM SUCC 125 MG IJ SOLR: 125.0000 mg | Freq: Once | INTRAMUSCULAR | Status: DC | PRN

## 2021-10-03 MED ORDER — MIDAZOLAM HCL 2 MG/ML PO SYRP: 8.0000 mg | ORAL_SOLUTION | Freq: Once | ORAL | Status: DC | PRN

## 2021-10-03 SURGICAL SUPPLY — 7 items
CATH CANNON HEMO 15FR 19 (HEMODIALYSIS SUPPLIES) ×1 IMPLANT
COVER PROBE U/S 5X48 (MISCELLANEOUS) ×1 IMPLANT
DERMABOND ADVANCED (GAUZE/BANDAGES/DRESSINGS) ×1
DERMABOND ADVANCED .7 DNX12 (GAUZE/BANDAGES/DRESSINGS) IMPLANT
PACK ANGIOGRAPHY (CUSTOM PROCEDURE TRAY) ×1 IMPLANT
SUT MNCRL AB 4-0 PS2 18 (SUTURE) ×1 IMPLANT
SUT PROLENE 0 CT 1 30 (SUTURE) ×1 IMPLANT

## 2021-10-03 NOTE — Interval H&P Note (Signed)
History and Physical Interval Note:  10/03/2021 9:54 AM  [name] [name]  has presented today for surgery, with the diagnosis of end stage renal disease.  The various methods of treatment have been discussed with the patient and family. After consideration of risks, benefits and other options for treatment, the patient has consented to  Procedure(s): DIALYSIS/PERMA CATHETER INSERTION (N/A) as a surgical intervention.  The patient's history has been reviewed, patient examined, no change in status, stable for surgery.  I have reviewed the patient's chart and labs.  Questions were answered to the patient's satisfaction.     [name]

## 2021-10-03 NOTE — Op Note (Signed)
OPERATIVE NOTE    PRE-OPERATIVE DIAGNOSIS: 1. ESRD   POST-OPERATIVE DIAGNOSIS: same as above  PROCEDURE: Ultrasound guidance for vascular access to the right internal jugular vein Fluoroscopic guidance for placement of catheter Placement of a 19 cm tip to cuff tunneled hemodialysis catheter via the right internal jugular vein  SURGEON: [name], MD  ANESTHESIA:  Local with Moderate conscious sedation for approximately 22 minutes using 2 mg of Versed and 50 mcg of Fentanyl  ESTIMATED BLOOD LOSS: 3 cc  FLUORO TIME: less than one minute  CONTRAST: none  FINDING(S): 1.  Patent right internal jugular vein  SPECIMEN(S):  None  INDICATIONS:   [name] [name] is a 81 y.o.male who presents with renal failure.  The patient needs long term dialysis access for their ESRD, and a Permcath is necessary.  Risks and benefits are discussed and informed consent is obtained.    DESCRIPTION: After obtaining full informed written consent, the patient was brought back to the vascular suited. The patient's right neck and chest were sterilely prepped and draped in a sterile surgical field was created. Moderate conscious sedation was administered during a face to face encounter with the patient throughout the procedure with my supervision of the RN administering medicines and monitoring the patient's vital signs, pulse oximetry, telemetry and mental status throughout from the start of the procedure until the patient was taken to the recovery room.  The right internal jugular vein was visualized with ultrasound and found to be patent. It was then accessed under direct ultrasound guidance and a permanent image was recorded. A wire was placed. After skin nick and dilatation, the peel-away sheath was placed over the wire. I then turned my attention to an area under the clavicle. Approximately 1-2 fingerbreadths below the clavicle a small counterincision was created and tunneled from the subclavicular incision  to the access site. Using fluoroscopic guidance, a 19 centimeter tip to cuff tunneled hemodialysis catheter was selected, and tunneled from the subclavicular incision to the access site. It was then placed through the peel-away sheath and the peel-away sheath was removed. Using fluoroscopic guidance the catheter tips were parked in the right atrium. The appropriate distal connectors were placed. It withdrew blood well and flushed easily with heparinized saline and a concentrated heparin solution was then placed. It was secured to the chest wall with 2 Prolene sutures. The access incision was closed single 4-0 Monocryl. A 4-0 Monocryl pursestring suture was placed around the exit site. Sterile dressings were placed. The patient tolerated the procedure well and was taken to the recovery room in stable condition.  COMPLICATIONS: None  CONDITION: Stable  [name], MD 10/03/2021 11:14 AM   This note was created with Dragon Medical transcription system. Any errors in dictation are purely unintentional.

## 2021-10-05 ENCOUNTER — Encounter: Payer: Self-pay | Admitting: Vascular Surgery

## 2021-10-11 ENCOUNTER — Other Ambulatory Visit: Payer: Medicare Other

## 2021-10-11 ENCOUNTER — Ambulatory Visit: Payer: Medicare Other

## 2021-10-11 ENCOUNTER — Ambulatory Visit: Payer: Medicare Other | Admitting: Oncology

## 2021-11-01 ENCOUNTER — Other Ambulatory Visit: Payer: Self-pay | Admitting: *Deleted

## 2021-11-01 DIAGNOSIS — D631 Anemia in chronic kidney disease: Secondary | ICD-10-CM

## 2021-11-01 DIAGNOSIS — N184 Chronic kidney disease, stage 4 (severe): Secondary | ICD-10-CM

## 2021-11-08 ENCOUNTER — Encounter: Payer: Self-pay | Admitting: Medical Oncology

## 2021-11-08 ENCOUNTER — Inpatient Hospital Stay: Payer: Medicare Other

## 2021-11-08 ENCOUNTER — Other Ambulatory Visit: Payer: Self-pay

## 2021-11-08 ENCOUNTER — Inpatient Hospital Stay: Payer: Medicare Other | Attending: Internal Medicine | Admitting: Medical Oncology

## 2021-11-08 VITALS — BP 116/60 | HR 56 | Temp 97.0°F | Resp 16 | Wt 187.0 lb

## 2021-11-08 DIAGNOSIS — N183 Chronic kidney disease, stage 3 unspecified: Secondary | ICD-10-CM

## 2021-11-08 DIAGNOSIS — Z992 Dependence on renal dialysis: Secondary | ICD-10-CM | POA: Diagnosis not present

## 2021-11-08 DIAGNOSIS — Z7982 Long term (current) use of aspirin: Secondary | ICD-10-CM | POA: Insufficient documentation

## 2021-11-08 DIAGNOSIS — I10 Essential (primary) hypertension: Secondary | ICD-10-CM | POA: Diagnosis not present

## 2021-11-08 DIAGNOSIS — D696 Thrombocytopenia, unspecified: Secondary | ICD-10-CM

## 2021-11-08 DIAGNOSIS — D631 Anemia in chronic kidney disease: Secondary | ICD-10-CM

## 2021-11-08 DIAGNOSIS — N186 End stage renal disease: Secondary | ICD-10-CM | POA: Insufficient documentation

## 2021-11-08 DIAGNOSIS — N184 Chronic kidney disease, stage 4 (severe): Secondary | ICD-10-CM

## 2021-11-08 DIAGNOSIS — Z79899 Other long term (current) drug therapy: Secondary | ICD-10-CM | POA: Diagnosis not present

## 2021-11-08 LAB — CBC WITH DIFFERENTIAL/PLATELET
Abs Immature Granulocytes: 0.02 10*3/uL (ref 0.00–0.07)
Basophils Absolute: 0 10*3/uL (ref 0.0–0.1)
Basophils Relative: 1 %
Eosinophils Absolute: 0.4 10*3/uL (ref 0.0–0.5)
Eosinophils Relative: 6 %
HCT: 27.5 % — ABNORMAL LOW (ref 39.0–52.0)
Hemoglobin: 8.6 g/dL — ABNORMAL LOW (ref 13.0–17.0)
Immature Granulocytes: 0 %
Lymphocytes Relative: 29 %
Lymphs Abs: 1.8 10*3/uL (ref 0.7–4.0)
MCH: 29.8 pg (ref 26.0–34.0)
MCHC: 31.3 g/dL (ref 30.0–36.0)
MCV: 95.2 fL (ref 80.0–100.0)
Monocytes Absolute: 0.5 10*3/uL (ref 0.1–1.0)
Monocytes Relative: 8 %
Neutro Abs: 3.5 10*3/uL (ref 1.7–7.7)
Neutrophils Relative %: 56 %
Platelets: 117 10*3/uL — ABNORMAL LOW (ref 150–400)
RBC: 2.89 MIL/uL — ABNORMAL LOW (ref 4.22–5.81)
RDW: 15 % (ref 11.5–15.5)
WBC: 6.3 10*3/uL (ref 4.0–10.5)
nRBC: 0 % (ref 0.0–0.2)

## 2021-11-08 LAB — IRON AND TIBC
Iron: 55 ug/dL (ref 45–182)
Saturation Ratios: 20 % (ref 17.9–39.5)
TIBC: 280 ug/dL (ref 250–450)
UIBC: 225 ug/dL

## 2021-11-08 LAB — FERRITIN: Ferritin: 232 ng/mL (ref 24–336)

## 2021-11-08 MED ORDER — EPOETIN ALFA-EPBX 10000 UNIT/ML IJ SOLN
20000.0000 [IU] | Freq: Once | INTRAMUSCULAR | Status: AC
  Administered 2021-11-08: 20000 [IU] via SUBCUTANEOUS
  Filled 2021-11-08: qty 2

## 2021-11-08 MED ORDER — EPOETIN ALFA-EPBX 40000 UNIT/ML IJ SOLN: 60000.0000 [IU] | Freq: Once | INTRAMUSCULAR | Status: DC

## 2021-11-08 MED ORDER — EPOETIN ALFA-EPBX 40000 UNIT/ML IJ SOLN
40000.0000 [IU] | Freq: Once | INTRAMUSCULAR | Status: AC
  Administered 2021-11-08: 40000 [IU] via SUBCUTANEOUS
  Filled 2021-11-08: qty 1

## 2021-11-08 NOTE — Progress Notes (Signed)
?[facility]  ?[phone] [phone] ? ?ID: [name] [name] OB: 03/12/1941  MR#: [id number]  XBM#:[id number] ? ?Patient Care Team: ?Dion Body, MD as PCP - General (Family Medicine) ?Lloyd Huger, MD as Consulting Physician (Hematology and Oncology) ? 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 ? ?INTERVAL HISTORY: Patient returns to our clinic today for laboratory work, further evaluation, and continuation of Retacrit. He reports that he started dialysis about 3 weeks ago. He attends these visits 3 times per week (M, W, F). He reports that since starting dialysis he has been a bit fatigued though he questions if this is more relating to the extra scheduling in his day. He remains the primary caretaker for his wife who has dementia but states that she has been improving which he is happy about. He has no neurologic complaints. He denies any recent fevers or illnesses. He has a good appetite and denies weight loss.  He denies any chest pain, shortness of breath, cough, or hemoptysis.  He denies any nausea, vomiting, or diarrhea. He denies any melena or hematochezia.  He has no urinary complaints.  Patient offers no specific complaints today. ? ?REVIEW OF SYSTEMS:   ?Review of Systems  ?Constitutional: Negative.  Negative for fever, malaise/fatigue and weight loss.  ?Respiratory: Negative.  Negative for cough and shortness of breath.   ?Cardiovascular: Negative.  Negative for chest pain and leg swelling.  ?Gastrointestinal: Negative.  Negative for abdominal pain, blood in stool, constipation and melena.  ?Genitourinary: Negative.  Negative for hematuria.  ?Musculoskeletal: Negative.  Negative for back pain and joint pain.  ?Skin: Negative.  Negative for rash.  ?Neurological: Negative.  Negative for sensory change, focal weakness, weakness and headaches.  ?Psychiatric/Behavioral: Negative.  The patient is not nervous/anxious.    ? ?As per HPI. Otherwise, a complete review of systems is negative. ? ?PAST MEDICAL HISTORY: ?Past Medical History:  ?Diagnosis Date  ? Arthritis   ? Chronic kidney disease   ? Stage 4 CKD  ? GERD (gastroesophageal reflux disease)   ? h/o  ? Gout   ? Hyperlipidemia, mixed   ? Hypertension   ? Sleep apnea   ? h/o no cpap  ? ? ?PAST SURGICAL HISTORY: ?Past Surgical History:  ?Procedure Laterality Date  ? APPENDECTOMY    ? Bunion Removal Right   ? COLONOSCOPY WITH PROPOFOL N/A 06/14/2015  ? Procedure: COLONOSCOPY WITH PROPOFOL;  Surgeon: Lollie Sails, MD;  Location: [facility] ENDOSCOPY;  Service: Endoscopy;  Laterality: N/A;  ? DIALYSIS/PERMA CATHETER INSERTION N/A 10/03/2021  ? Procedure: DIALYSIS/PERMA CATHETER INSERTION;  Surgeon: Algernon Huxley, MD;  Location: [facility];  Service: Cardiovascular;  Laterality: N/A;  ? HERNIA REPAIR    ? abd  ? PROSTATE BIOPSY N/A 03/27/2017  ? Procedure: PROSTATE BIOPSY-URO NAV;  Surgeon: Royston Cowper, MD;  Location: [facility] ORS;  Service: Urology;  Laterality: N/A;  ? PROSTATE BIOPSY N/A 09/11/2019  ? Procedure: PROSTATE BIOPSY Pearson Forster;  Surgeon: Royston Cowper, MD;  Location: [facility] ORS;  Service: Urology;  Laterality: N/A;  ? RETINAL DETACHMENT SURGERY Left   ? TRANSURETHRAL RESECTION OF PROSTATE    ? ? ?FAMILY HISTORY: ?Family History  ?Problem Relation Age of Onset  ? Leukemia Father   ? Leukemia Brother   ? ? ?ADVANCED DIRECTIVES (Y/N):  N ? ?HEALTH MAINTENANCE: ?Social History  ? ?Tobacco Use  ? Smoking status: Never  ? Smokeless tobacco: Never  ?Vaping Use  ? Vaping  Use: Never used  ?Substance Use Topics  ? Alcohol use: Yes  ?  Comment: rare  ? Drug use: No  ? ? ?Allergies  ?Allergen Reactions  ? Fish Allergy Swelling  ? Ciprofloxacin   ?  Unknown reaction  ? Doxazosin Other (See Comments)  ?  unknown  ? ? ?Current Outpatient Medications  ?Medication Sig Dispense Refill  ? acetaminophen (TYLENOL) 500 MG tablet Take 1,000 mg by mouth every 6 (six) hours as  needed for moderate pain or headache.     ? amLODipine (NORVASC) 10 MG tablet Take 10 mg by mouth daily.    ? aspirin EC 81 MG tablet Take 81 mg by mouth daily.    ? enalapril (VASOTEC) 20 MG tablet Take 20 mg by mouth 2 (two) times daily.     ? fluticasone (FLONASE) 50 MCG/ACT nasal spray Place 2 sprays into both nostrils daily as needed for allergies.     ? hydrALAZINE (APRESOLINE) 100 MG tablet Take 100 mg by mouth 3 (three) times daily.    ? Ketotifen Fumarate (ALLERGY EYE DROPS OP) Place 1-2 drops into both eyes daily as needed (allergies/irritation).    ? lovastatin (MEVACOR) 20 MG tablet Take 20 mg by mouth at bedtime.    ? metoprolol (LOPRESSOR) 50 MG tablet Take 50 mg by mouth 2 (two) times daily.    ? sodium bicarbonate 650 MG tablet Take 650 mg by mouth 2 (two) times daily.     ? ULORIC 40 MG tablet Take 40 mg by mouth daily.     ? bicalutamide (CASODEX) 50 MG tablet Take 50 mg by mouth daily.    ? glucose blood test strip USE TO CHECK FASTING BLOOD SUGAR ONCE DAILY (Patient not taking: Reported on 10/03/2021)    ? Insulin Pen Needle 32G X 5 MM MISC Use 1 Units once daily. Dx: E11.9 (Patient not taking: Reported on 10/03/2021)    ? LUPRON DEPOT, 59-MONTH, 45 MG injection Inject 45 mg into the muscle every 6 (six) months. (Patient not taking: Reported on 11/08/2021)    ? ?No current facility-administered medications for this visit.  ? ? ?OBJECTIVE: ?Vitals:  ? 11/08/21 1000  ?BP: 116/60  ?Pulse: (!) 56  ?Resp: 16  ?Temp: (!) 97 ?F (36.1 ?C)  ?   Body mass index is 28.43 kg/m?Marland Kitchen    ECOG FS:0 - Asymptomatic ? ?General: Well-developed, well-nourished, no acute distress. ?Eyes: Pink conjunctiva, anicteric sclera. ?HEENT: Normocephalic, moist mucous membranes. ?Lungs: No audible wheezing or coughing. ?Heart: Regular rate and rhythm. ?Abdomen: Soft, nontender, no obvious distention. ?Musculoskeletal: No edema, cyanosis, or clubbing. ?Neuro: Alert, answering all questions appropriately. Cranial nerves grossly  intact. ?Skin: No rashes or petechiae noted. ?Psych: Normal affect. ? ? ?LAB RESULTS: ? ?Lab Results  ?Component Value Date  ? NA 140 06/17/2018  ? K 4.2 06/17/2018  ? CL 112 (H) 06/17/2018  ? CO2 21 (L) 06/17/2018  ? GLUCOSE 186 (H) 06/17/2018  ? BUN 76 (H) 06/17/2018  ? CREATININE 4.10 (H) 08/06/2019  ? CALCIUM 11.3 (H) 06/17/2018  ? PROT 7.3 10/14/2012  ? ALBUMIN 2.4 (L) 10/14/2012  ? AST 15 10/14/2012  ? ALT 15 10/14/2012  ? ALKPHOS 84 10/14/2012  ? BILITOT 0.3 10/14/2012  ? GFRNONAA 9 (L) 06/17/2018  ? GFRAA 10 (L) 06/17/2018  ? ? ?Lab Results  ?Component Value Date  ? WBC 6.3 11/08/2021  ? NEUTROABS 3.5 11/08/2021  ? HGB 8.6 (L) 11/08/2021  ? HCT 27.5 (L) 11/08/2021  ?  MCV 95.2 11/08/2021  ? PLT 117 (L) 11/08/2021  ? ?Lab Results  ?Component Value Date  ? IRON 83 09/27/2021  ? TIBC 263 09/27/2021  ? IRONPCTSAT 32 09/27/2021  ? ?Lab Results  ?Component Value Date  ? FERRITIN 139 09/27/2021  ? ? ? ?STUDIES: ?No results found. ? 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 ? ?PLAN:   ? ?1. 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 ron stores continue to be within normal limits.  Previously it was noted when patient missed his Retacrit injection his hemoglobin drops significantly.  Proceeding forward with 40,000 units subcutaneous Retacrit today. He wishes to transfer his care to his dialysis clinic which I have discussed with Dr. Grayland Ormond. Will send a message to Dr. Holley Raring who is his nephrologist but patient is also aware that he needs to contact their office as well to ensure this is set up for him so he is not without treatment.  ?2. Thrombocytopenia: Chronic and stable.  Patient's platelet count is 117. No bleeding episodes per patient.  ?3. Chronic renal failure: Chronic and now on dialysis. He will continue follow up with Dr. Holley Raring.  ?4.  Hypertension: Chronic and improved while on dialysis.  Proceed with Retacrit as  above. ? ? ?Patient expressed understanding and was in agreement with this plan. He also understands that He can call clinic at any time with any questions, concerns, or complaints.  ? ?Hughie Closs, PA-C   11/09/18

## 2021-11-08 NOTE — Progress Notes (Signed)
Patient has started Dialysis on Mon, Wed, Fri. ?

## 2021-11-11 ENCOUNTER — Other Ambulatory Visit: Payer: Self-pay

## 2021-11-11 ENCOUNTER — Telehealth: Payer: Self-pay

## 2021-11-11 ENCOUNTER — Ambulatory Visit: Payer: Medicare Other | Admitting: Surgery

## 2021-11-11 ENCOUNTER — Encounter: Payer: Self-pay | Admitting: Surgery

## 2021-11-11 VITALS — BP 127/57 | HR 67 | Temp 98.3°F | Ht 69.0 in | Wt 185.4 lb

## 2021-11-11 DIAGNOSIS — Z992 Dependence on renal dialysis: Secondary | ICD-10-CM | POA: Diagnosis not present

## 2021-11-11 DIAGNOSIS — K430 Incisional hernia with obstruction, without gangrene: Secondary | ICD-10-CM | POA: Diagnosis not present

## 2021-11-11 DIAGNOSIS — N186 End stage renal disease: Secondary | ICD-10-CM

## 2021-11-11 NOTE — Patient Instructions (Signed)
Our surgery scheduler Pamala Hurry will call you within 24-48 hours to get you scheduled. If you have not heard from her after 48 hours, please call our office. You will not need to get Covid tested before surgery and have the blue sheet available when she calls to write down important information. ? ? ? ?If you have any concerns or questions, please feel free to call our office.  ? ?Peritoneal Dialysis Information ?Peritoneal dialysis is a procedure that filters your blood. You may have this procedure if your kidneys are not working well. You can perform peritoneal dialysis yourself, or a machine can do it for you at night when you sleep.  ?Tell a health care provider about: ?Any allergies you have. ?All medicines you are taking, including vitamins, herbs, eye drops, creams, and over-the-counter medicines. ?Any problems you or family members have had with anesthetic medicines. ?Any blood disorders you have. ?Any surgeries you have. ?Any medical conditions you have. ?Whether you are pregnant or may be pregnant. ?What are the risks? ?Generally, this is a safe procedure. However, problems may occur, including: ?Infection in the lining of your abdomen (peritoneum). This is the most common problem. ?Infection in the area where the catheter was inserted. ?Discomfort in the area where the catheter was inserted. ?Weakened abdominal muscles. This may lead to a hernia, which can cause problems if left untreated. ?What happens before the procedure? ?It is important to safely prepare for treatment and take steps to prevent infection. Your health care provider will teach you how to prepare for a dialysis session. Preparation may involve: ?Putting on a mask. ?Closing doors and windows in the room where dialysis will be performed. ?Making sure to wash your hands before and during treatment. Anyone who touches you or the equipment should also wash his or her hands often. ?Making sure that tubing and equipment are germ-free  (sterile). ?Checking the bag of fluid (dialysate) you will use during the session, to make sure it is sealed and free of germs (uncontaminated). ?What happens during the procedure? ?At the start of a session, your abdomen is filled with dialysate. The dialysate pulls waste, salt, and extra water through the peritoneum. At the end of the session, the dialysate, plus all the waste it pulled from your blood, is drained from your body. ?There are two kinds of peritoneal dialysis. You may be treated using: ?Continuous cycling peritoneal dialysis (CCPD). In this type, a machine called a cycler performs an exchange for you by filling and draining your abdomen while you sleep. ?Continuous ambulatory peritoneal dialysis (CAPD). In this type, you perform exchanges for yourself up to 5 times a day. Each exchange takes about 30-40 minutes. The amount of time the dialysate stays in your body (the dwell) usually varies from 1.5-3 hours. You may go about your day normally between exchanges. ?Some people may need to have both CAPD and CCPD. ?What can I expect after procedure? ?You may need to have lab work or other tests done to check on how well the dialysis is working. ?Change the bandage (dressing) around your permanent catheter as directed by your health care provider. Keep the dressing clean and dry. ?Weigh yourself after the treatment and write down your weight. ?Follow these instructions at home: ?Eating and drinking ?Follow your health care provider's instructions about diet. You should follow a diet plan that includes: ?Nutritional counseling with a dietitian. ?Vitamin supplements. ?High-quality proteins, such as meat, poultry, fish, and eggs. Most people on peritoneal dialysis need to eat  a high-protein diet because protein is lost during the dialysis exchange. ?Preventing constipation ?Avoid becoming constipated. Constipation prevents dialysate from draining well. To prevent constipation: ?Eat foods that are high in  fiber, such as beans, whole grains, and fresh fruits and vegetables. ?Limit foods that are high in fat and processed sugars, such as fried or sweet foods. ?Be active. ?Go to the restroom when you feel that you need to. Do not hold it in. ?Take over-the-counter or prescription medicines, such as laxatives, only if your health care provider recommends them. ?General instructions ?Keep a strict schedule. Dialysis must be done every day. Do not skip a day or an exchange. Make time for each exchange. ?Always keep the dialysate bags and other supplies in a cool, clean, and dry place. ?Take over-the-counter and prescription medicines only as told by your health care provider. ?Weigh yourself every day. Sudden weight gain may be a sign of a problem. ?Keep all follow-up visits. This is important. ?Where to find more information ?You can find more information about peritoneal dialysis treatment from: ?[employer]: [employer] ?[place]: www.kidney.org ?Contact a health care provider if: ?You have a fever or chills. ?You feel nauseous or you vomit. ?You have diarrhea. ?You have any problems with an exchange. ?Your blood pressure increases. ?You suddenly gain weight or feel short of breath. ?The catheter seems loose or feels like it is coming out. ?The fluid that has drained from your abdomen is pinkish or reddish. Women having their menstrual period do not need to seek medical care if the fluid is only a little pink or a little red. ?There are white strands in the dialysate that are large enough to get stuck in your tubing or catheter. ?Get help right away if: ?The area around the catheter swells or becomes red, tender, or painful. ?There is pus coming from the catheter area. ?The fluid that has drained from your abdomen is cloudy. ?You feel more abdominal pain or discomfort. ?Summary ?Peritoneal dialysis is a procedure that filters your blood. You  may have this procedure if your kidneys are not working well. ?CAPD and CCPD are the two kinds of peritoneal dialysis. Your health care provider will help you decide which kind is best for you. ?The main risks of peritoneal dialysis are infection and weakened abdominal muscles, which may lead to a hernia. ?This information is not intended to replace advice given to you by your health care provider. Make sure you discuss any questions you have with your health care provider. ?Document Revised: 03/25/2020 Document Reviewed: 03/25/2020 ?Elsevier Patient Education ? [place]. ? ?

## 2021-11-11 NOTE — Telephone Encounter (Signed)
Faxed medical clearance to Dr. Netty Starring at [phone]. ?

## 2021-11-11 NOTE — Progress Notes (Signed)
?11/11/2021 ? ?History of Present Illness: ?[name] [name] is a 81 y.o. 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  He also has a history of an incisional hernia from a prior laparoscopic appendectomy.  The patient was previously seen last year by Dr. Dahlia Byes for the same but at that time, the patient had to cancel surgery as he needed to take care of his wife.  Now he feels that he is in a better place and his wife is doing better and he would like to proceed with surgery.  He has discussed with Dr. Holley Raring with nephrology about a peritoneal dialysis catheter placement as this will allow him to be more at home with his wife.  He also reports that his incisional hernia is stable in size and does not cause him any significant symptoms.  He does feel that it bulges reports that when he is straining it bulges more, but denies any significant pain.  Denies any nausea, vomiting, chest pain, shortness of breath, nausea, vomiting.  Currently he is having hemodialysis via a temporary dialysis catheter placed by Dr. Lucky Cowboy with vascular surgery. ? ?Past Medical History: ?Past Medical History:  ?Diagnosis Date  ? Arthritis   ? Chronic kidney disease   ? Stage 4 CKD  ? GERD (gastroesophageal reflux disease)   ? h/o  ? Gout   ? Hyperlipidemia, mixed   ? Hypertension   ? Sleep apnea   ? h/o no cpap  ?  ? ?Past Surgical History: ?Past Surgical History:  ?Procedure Laterality Date  ? APPENDECTOMY    ? Bunion Removal Right   ? COLONOSCOPY WITH PROPOFOL N/A 06/14/2015  ? Procedure: COLONOSCOPY WITH PROPOFOL;  Surgeon: Lollie Sails, MD;  Location: [facility] ENDOSCOPY;  Service: Endoscopy;  Laterality: N/A;  ? DIALYSIS/PERMA CATHETER INSERTION N/A 10/03/2021  ? Procedure: DIALYSIS/PERMA CATHETER INSERTION;  Surgeon: Algernon Huxley, MD;  Location: [facility];  Service: Cardiovascular;  Laterality: N/A;  ? HERNIA REPAIR    ? abd  ? PROSTATE BIOPSY N/A 03/27/2017  ?  Procedure: PROSTATE BIOPSY-URO NAV;  Surgeon: Royston Cowper, MD;  Location: [facility] ORS;  Service: Urology;  Laterality: N/A;  ? PROSTATE BIOPSY N/A 09/11/2019  ? Procedure: PROSTATE BIOPSY Pearson Forster;  Surgeon: Royston Cowper, MD;  Location: [facility] ORS;  Service: Urology;  Laterality: N/A;  ? RETINAL DETACHMENT SURGERY Left   ? TRANSURETHRAL RESECTION OF PROSTATE    ? ? ?Home Medications: ?Prior to Admission medications   ?Medication Sig Start Date End Date Taking? Authorizing Provider  ?acetaminophen (TYLENOL) 500 MG tablet Take 1,000 mg by mouth every 6 (six) hours as needed for moderate pain or headache.    Yes [provider]  ?amLODipine (NORVASC) 10 MG tablet Take 10 mg by mouth daily.   Yes [provider]  ?aspirin EC 81 MG tablet Take 81 mg by mouth daily.   Yes [provider]  ?bicalutamide (CASODEX) 50 MG tablet Take 50 mg by mouth daily. 10/23/19  Yes [provider]  ?enalapril (VASOTEC) 20 MG tablet Take 20 mg by mouth 2 (two) times daily.    Yes [provider]  ?fluticasone (FLONASE) 50 MCG/ACT nasal spray Place 2 sprays into both nostrils daily as needed for allergies.    Yes [provider]  ?hydrALAZINE (APRESOLINE) 100 MG tablet Take 100 mg by mouth 3 (three) times daily.   Yes [provider]  ?Ketotifen Fumarate (ALLERGY  EYE DROPS OP) Place 1-2 drops into both eyes daily as needed (allergies/irritation).   Yes [provider]  ?lovastatin (MEVACOR) 20 MG tablet Take 20 mg by mouth at bedtime.   Yes [provider]  ?LUPRON DEPOT, 75-MONTH, 45 MG injection Inject 45 mg into the muscle every 6 (six) months. 11/11/19  Yes [provider]  ?metoprolol (LOPRESSOR) 50 MG tablet Take 50 mg by mouth 2 (two) times daily.   Yes [provider]  ?sodium bicarbonate 650 MG tablet Take 650 mg by mouth 2 (two) times daily.    Yes [provider]  ?ULORIC 40 MG tablet Take 40 mg by mouth daily.   03/09/17  Yes [provider]  ? ? ?Allergies: ?Allergies  ?Allergen Reactions  ? Fish Allergy Swelling  ? Ciprofloxacin   ?  Unknown reaction  ? Doxazosin Other (See Comments)  ?  unknown  ? ? ?Review of Systems: ?Review of Systems  ?Constitutional:  Negative for chills and fever.  ?HENT:  Negative for hearing loss.   ?Respiratory:  Negative for shortness of breath.   ?Cardiovascular:  Negative for chest pain.  ?Gastrointestinal:  Negative for abdominal pain, nausea and vomiting.  ?Genitourinary:  Negative for dysuria.  ?Musculoskeletal:  Negative for myalgias.  ?Skin:  Negative for rash.  ?Neurological:  Negative for dizziness.  ?Psychiatric/Behavioral:  Negative for depression.   ? ?Physical Exam ?BP (!) 127/57   Pulse 67   Temp 98.3 ?F (36.8 ?C) (Oral)   Ht '5\' 9"'$  (1.753 m)   Wt 185 lb 6.4 oz (84.1 kg)   SpO2 99%   BMI 27.38 kg/m?  ?CONSTITUTIONAL: No acute distress, well-nourished ?HEENT:  Normocephalic, atraumatic, extraocular motion intact. ?NECK: Trachea is midline, no jugular venous distention. ?RESPIRATORY:  Lungs are clear, and breath sounds are equal bilaterally. Normal respiratory effort without pathologic use of accessory muscles. ?CARDIOVASCULAR: Heart is regular without murmurs, gallops, or rubs. ?GI: The abdomen is soft, nondistended, nontender to palpation.  The patient does have an incisional hernia at the site of a prior laparoscopic port placement which measures about 2.5 cm in size although without much larger hernia sac.  Just inferior to it patient also has a much smaller less than 1 cm hernia, both of which appear to contain fat.  ?MUSCULOSKELETAL: Normal gait, no peripheral edema.  Patient does have a right chest temporary dialysis catheter placed. ?NEUROLOGIC:  Motor and sensation is grossly normal.  Cranial nerves are grossly intact. ?PSYCH:  Alert and oriented to person, place and time. Affect is normal. ? ?Labs/Imaging: ?MRI abdomen on 02/24/2021: ?IMPRESSION: ?1. 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 ?2. Additional simple appearing renal cysts in LEFT kidney. ?3. Pelvic kidney on the RIGHT has similar benign-appearing cysts. ?4. Small ventral hernia. ?5. Cholelithiasis without cholecystitis. ? ?Labs from 11/08/2021: ?WBC 6.3, hemoglobin 8.6, hematocrit 27.5, platelets 117.  Iron 55, TIBC 280, ferritin 232. ? ?Assessment and Plan: ?This is a 81 y.o. male with end-stage renal disease requiring dialysis as well as a incisional hernia. ? ?- Discussed with patient that we can certainly offer him help with placement of his peritoneal dialysis catheter so that he can do dialysis at home.  At the same time, we will also repair the incisional hernia that he has so that there is no complications with his dialysis and the hernia later on.  Reviewed with him that we will be able to do this via robotic approach and the plan for surgery  will be a robotic assisted incisional hernia repair with placement of a peritoneal dialysis catheter.  Discussed with him the surgery at length including the hernia repair and the PD catheter placement, that we would use mesh to reinforce the hernia repair, risks of bleeding, infection, injury to surrounding structures, postoperative activity restrictions, that this is an outpatient surgery, and he is willing to proceed. ?- We will schedule the patient for 11/23/21.  All of his questions have been answered.  We will send for medical clearance and he is aware of stopping his aspirin with the last dose on 11/17/2021. ? ?I spent 40 minutes dedicated to the care of this patient on the date of this encounter to include pre-visit review of records, face-to-face time with the patient discussing diagnosis and management, and any post-visit coordination of care. ? ? ?Melvyn Neth, MD ?[facility] ? ? ?  ?

## 2021-11-11 NOTE — H&P (View-Only) (Signed)
?11/11/2021 ? ?History of Present Illness: ?[name] [name] is a 81 y.o. 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  He also has a history of an incisional hernia from a prior laparoscopic appendectomy.  The patient was previously seen last year by Dr. Dahlia Byes for the same but at that time, the patient had to cancel surgery as he needed to take care of his wife.  Now he feels that he is in a better place and his wife is doing better and he would like to proceed with surgery.  He has discussed with Dr. Holley Raring with nephrology about a peritoneal dialysis catheter placement as this will allow him to be more at home with his wife.  He also reports that his incisional hernia is stable in size and does not cause him any significant symptoms.  He does feel that it bulges reports that when he is straining it bulges more, but denies any significant pain.  Denies any nausea, vomiting, chest pain, shortness of breath, nausea, vomiting.  Currently he is having hemodialysis via a temporary dialysis catheter placed by Dr. Lucky Cowboy with vascular surgery. ? ?Past Medical History: ?Past Medical History:  ?Diagnosis Date  ? Arthritis   ? Chronic kidney disease   ? Stage 4 CKD  ? GERD (gastroesophageal reflux disease)   ? h/o  ? Gout   ? Hyperlipidemia, mixed   ? Hypertension   ? Sleep apnea   ? h/o no cpap  ?  ? ?Past Surgical History: ?Past Surgical History:  ?Procedure Laterality Date  ? APPENDECTOMY    ? Bunion Removal Right   ? COLONOSCOPY WITH PROPOFOL N/A 06/14/2015  ? Procedure: COLONOSCOPY WITH PROPOFOL;  Surgeon: Lollie Sails, MD;  Location: [facility] ENDOSCOPY;  Service: Endoscopy;  Laterality: N/A;  ? DIALYSIS/PERMA CATHETER INSERTION N/A 10/03/2021  ? Procedure: DIALYSIS/PERMA CATHETER INSERTION;  Surgeon: Algernon Huxley, MD;  Location: [facility] [facility];  Service: Cardiovascular;  Laterality: N/A;  ? HERNIA REPAIR    ? abd  ? PROSTATE BIOPSY N/A 03/27/2017  ?  Procedure: PROSTATE BIOPSY-URO NAV;  Surgeon: Royston Cowper, MD;  Location: [facility] ORS;  Service: Urology;  Laterality: N/A;  ? PROSTATE BIOPSY N/A 09/11/2019  ? Procedure: PROSTATE BIOPSY Pearson Forster;  Surgeon: Royston Cowper, MD;  Location: [facility] ORS;  Service: Urology;  Laterality: N/A;  ? RETINAL DETACHMENT SURGERY Left   ? TRANSURETHRAL RESECTION OF PROSTATE    ? ? ?Home Medications: ?Prior to Admission medications   ?Medication Sig Start Date End Date Taking? Authorizing Provider  ?acetaminophen (TYLENOL) 500 MG tablet Take 1,000 mg by mouth every 6 (six) hours as needed for moderate pain or headache.    Yes [provider]  ?amLODipine (NORVASC) 10 MG tablet Take 10 mg by mouth daily.   Yes [provider]  ?aspirin EC 81 MG tablet Take 81 mg by mouth daily.   Yes [provider]  ?bicalutamide (CASODEX) 50 MG tablet Take 50 mg by mouth daily. 10/23/19  Yes [provider]  ?enalapril (VASOTEC) 20 MG tablet Take 20 mg by mouth 2 (two) times daily.    Yes [provider]  ?fluticasone (FLONASE) 50 MCG/ACT nasal spray Place 2 sprays into both nostrils daily as needed for allergies.    Yes [provider]  ?hydrALAZINE (APRESOLINE) 100 MG tablet Take 100 mg by mouth 3 (three) times daily.   Yes [provider]  ?Ketotifen Fumarate (ALLERGY  EYE DROPS OP) Place 1-2 drops into both eyes daily as needed (allergies/irritation).   Yes [provider]  ?lovastatin (MEVACOR) 20 MG tablet Take 20 mg by mouth at bedtime.   Yes [provider]  ?LUPRON DEPOT, 70-MONTH, 45 MG injection Inject 45 mg into the muscle every 6 (six) months. 11/11/19  Yes [provider]  ?metoprolol (LOPRESSOR) 50 MG tablet Take 50 mg by mouth 2 (two) times daily.   Yes [provider]  ?sodium bicarbonate 650 MG tablet Take 650 mg by mouth 2 (two) times daily.    Yes [provider]  ?ULORIC 40 MG tablet Take 40 mg by mouth daily.   03/09/17  Yes [provider]  ? ? ?Allergies: ?Allergies  ?Allergen Reactions  ? Fish Allergy Swelling  ? Ciprofloxacin   ?  Unknown reaction  ? Doxazosin Other (See Comments)  ?  unknown  ? ? ?Review of Systems: ?Review of Systems  ?Constitutional:  Negative for chills and fever.  ?HENT:  Negative for hearing loss.   ?Respiratory:  Negative for shortness of breath.   ?Cardiovascular:  Negative for chest pain.  ?Gastrointestinal:  Negative for abdominal pain, nausea and vomiting.  ?Genitourinary:  Negative for dysuria.  ?Musculoskeletal:  Negative for myalgias.  ?Skin:  Negative for rash.  ?Neurological:  Negative for dizziness.  ?Psychiatric/Behavioral:  Negative for depression.   ? ?Physical Exam ?BP (!) 127/57   Pulse 67   Temp 98.3 ?F (36.8 ?C) (Oral)   Ht '5\' 9"'$  (1.753 m)   Wt 185 lb 6.4 oz (84.1 kg)   SpO2 99%   BMI 27.38 kg/m?  ?CONSTITUTIONAL: No acute distress, well-nourished ?HEENT:  Normocephalic, atraumatic, extraocular motion intact. ?NECK: Trachea is midline, no jugular venous distention. ?RESPIRATORY:  Lungs are clear, and breath sounds are equal bilaterally. Normal respiratory effort without pathologic use of accessory muscles. ?CARDIOVASCULAR: Heart is regular without murmurs, gallops, or rubs. ?GI: The abdomen is soft, nondistended, nontender to palpation.  The patient does have an incisional hernia at the site of a prior laparoscopic port placement which measures about 2.5 cm in size although without much larger hernia sac.  Just inferior to it patient also has a much smaller less than 1 cm hernia, both of which appear to contain fat.  ?MUSCULOSKELETAL: Normal gait, no peripheral edema.  Patient does have a right chest temporary dialysis catheter placed. ?NEUROLOGIC:  Motor and sensation is grossly normal.  Cranial nerves are grossly intact. ?PSYCH:  Alert and oriented to person, place and time. Affect is normal. ? ?Labs/Imaging: ?MRI abdomen on 02/24/2021: ?IMPRESSION: ?1. 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 ?2. Additional simple appearing renal cysts in LEFT kidney. ?3. Pelvic kidney on the RIGHT has similar benign-appearing cysts. ?4. Small ventral hernia. ?5. Cholelithiasis without cholecystitis. ? ?Labs from 11/08/2021: ?WBC 6.3, hemoglobin 8.6, hematocrit 27.5, platelets 117.  Iron 55, TIBC 280, ferritin 232. ? ?Assessment and Plan: ?This is a 81 y.o. male with end-stage renal disease requiring dialysis as well as a incisional hernia. ? ?- Discussed with patient that we can certainly offer him help with placement of his peritoneal dialysis catheter so that he can do dialysis at home.  At the same time, we will also repair the incisional hernia that he has so that there is no complications with his dialysis and the hernia later on.  Reviewed with him that we will be able to do this via robotic approach and the plan for surgery  will be a robotic assisted incisional hernia repair with placement of a peritoneal dialysis catheter.  Discussed with him the surgery at length including the hernia repair and the PD catheter placement, that we would use mesh to reinforce the hernia repair, risks of bleeding, infection, injury to surrounding structures, postoperative activity restrictions, that this is an outpatient surgery, and he is willing to proceed. ?- We will schedule the patient for 11/23/21.  All of his questions have been answered.  We will send for medical clearance and he is aware of stopping his aspirin with the last dose on 11/17/2021. ? ?I spent 40 minutes dedicated to the care of this patient on the date of this encounter to include pre-visit review of records, face-to-face time with the patient discussing diagnosis and management, and any post-visit coordination of care. ? ? ?Melvyn Neth, MD ?[facility] ? ? ?  ?

## 2021-11-14 ENCOUNTER — Telehealth: Payer: Self-pay | Admitting: Surgery

## 2021-11-14 NOTE — Telephone Encounter (Signed)
Patient has been advised of Pre-Admission date/time, COVID Testing date and Surgery date. ? ?Surgery Date: 11/23/21 ?Preadmission Testing Date: 11/17/21 (phone 1p-5p) ?Covid Testing Date: Not needed.    ? ?Patient has been made aware to call [phone], between 1-3:00pm the day before surgery, to find out what time to arrive for surgery.   ? ?

## 2021-11-17 ENCOUNTER — Other Ambulatory Visit: Payer: Self-pay

## 2021-11-17 ENCOUNTER — Other Ambulatory Visit
Admission: RE | Admit: 2021-11-17 | Discharge: 2021-11-17 | Disposition: A | Discharge status: Home / Self Care | Payer: Medicare Other | Source: Ambulatory Visit | Attending: Surgery | Admitting: Surgery

## 2021-11-17 DIAGNOSIS — N185 Chronic kidney disease, stage 5: Secondary | ICD-10-CM

## 2021-11-17 DIAGNOSIS — I10 Essential (primary) hypertension: Secondary | ICD-10-CM

## 2021-11-17 DIAGNOSIS — Z01812 Encounter for preprocedural laboratory examination: Secondary | ICD-10-CM

## 2021-11-17 DIAGNOSIS — D631 Anemia in chronic kidney disease: Secondary | ICD-10-CM

## 2021-11-17 DIAGNOSIS — N184 Chronic kidney disease, stage 4 (severe): Secondary | ICD-10-CM

## 2021-11-17 DIAGNOSIS — D693 Immune thrombocytopenic purpura: Secondary | ICD-10-CM

## 2021-11-17 HISTORY — DX: Malignant (primary) neoplasm, unspecified: C80.1

## 2021-11-17 NOTE — Patient Instructions (Addendum)
Your procedure is scheduled on: 11/23/21 - Wednesday ?Report to the Registration Desk on the 1st floor of the [facility]. ?To find out your arrival time, please call [phone] between 1PM - 3PM on: 11/22/21 - Tuesday ? ?REMEMBER: ?Instructions that are not followed completely may result in serious medical risk, up to and including death; or upon the discretion of your surgeon and anesthesiologist your surgery may need to be rescheduled. ? ?Do not eat food after midnight the night before surgery.  ?No gum chewing, lozengers or hard candies. ? ?You may however, drink CLEAR liquids up to 2 hours before you are scheduled to arrive for your surgery. Do not drink anything within 2 hours of your scheduled arrival time. ? ?Clear liquids include: ?- water  ?- apple juice without pulp ?- gatorade (not RED colors) ?- black coffee or tea (Do NOT add milk or creamers to the coffee or tea) ?Do NOT drink anything that is not on this list. ? ?TAKE THESE MEDICATIONS THE MORNING OF SURGERY WITH A SIP OF WATER: ? ?- amLODipine (NORVASC) 10 MG tablet ?- hydrALAZINE (APRESOLINE) 100 MG tablet ?- metoprolol (LOPRESSOR) 50 MG tablet ?- sodium bicarbonate 650 MG tablet ? ?Follow recommendations from Cardiologist, Pulmonologist or PCP regarding stopping Aspirin, Coumadin, Plavix, Eliquis, Pradaxa, or Pletal. Aspirin '81mg'$  stopped 11/17/21. ? ?One week prior to surgery: ?Stop Anti-inflammatories (NSAIDS) such as Advil, Aleve, Ibuprofen, Motrin, Naproxen, Naprosyn and Aspirin based products such as Excedrin, Goodys Powder, BC Powder. ? ?Stop ANY OVER THE COUNTER supplements until after surgery. ? ?You may however, continue to take Tylenol if needed for pain up until the day of surgery. ? ?No Alcohol for 24 hours before or after surgery. ? ?No Smoking including e-cigarettes for 24 hours prior to surgery.  ?No chewable tobacco products for at least 6 hours prior to surgery.  ?No nicotine patches on the day of surgery. ? ?Do not use any  "recreational" drugs for at least a week prior to your surgery.  ?Please be advised that the combination of cocaine and anesthesia may have negative outcomes, up to and including death. ?If you test positive for cocaine, your surgery will be cancelled. ? ?On the morning of surgery brush your teeth with toothpaste and water, you may rinse your mouth with mouthwash if you wish. ?Do not swallow any toothpaste or mouthwash. ? ?Use CHG Soap or wipes as directed on instruction sheet. ? ?Do not wear jewelry, make-up, hairpins, clips or nail polish. ? ?Do not wear lotions, powders, or perfumes.  ? ?Do not shave body from the neck down 48 hours prior to surgery just in case you cut yourself which could leave a site for infection.  ?Also, freshly shaved skin may become irritated if using the CHG soap. ? ?Contact lenses, hearing aids and dentures may not be worn into surgery. ? ?Do not bring valuables to the hospital. [facility] is not responsible for any missing/lost belongings or valuables.  ? ?notify your doctor if there is any change in your medical condition (cold, fever, infection). ? ?Wear comfortable clothing (specific to your surgery type) to the hospital. ? ?After surgery, you can help prevent lung complications by doing breathing exercises.  ?Take deep breaths and cough every 1-2 hours. Your doctor may order a device called an Incentive Spirometer to help you take deep breaths. ?When coughing or sneezing, hold a pillow firmly against your incision with both hands. This is called ?splinting.? Doing this helps protect your incision. It also  decreases belly discomfort. ? ?If you are being admitted to the hospital overnight, leave your suitcase in the car. ?After surgery it may be brought to your room. ? ?If you are being discharged the day of surgery, you will not be allowed to drive home. ?You will need a responsible adult (18 years or older) to drive you home and stay with you that night.  ? ?If you are taking  public transportation, you will need to have a responsible adult (18 years or older) with you. ?Please confirm with your physician that it is acceptable to use public transportation.  ? ?Please call the [facility] Dept. at [phone] if you have any questions about these instructions. ? ?Surgery Visitation Policy: ? ?Patients undergoing a surgery or procedure may have two family members or support persons with them as long as the person is not COVID-19 positive or experiencing its symptoms.  ? ?Inpatient Visitation:   ? ?Visiting hours are 7 a.m. to 8 p.m. ?Up to four visitors are allowed at one time in a patient room, including children. The visitors may rotate out with other people during the day. One designated support person (adult) may remain overnight.  ?

## 2021-11-21 NOTE — Progress Notes (Signed)
Clearance received from Dr Netty Starring. The patient is cleared at Low risk for surgery. He may hold his Aspirin for 5 days prior to surgery.  ?

## 2021-11-23 ENCOUNTER — Ambulatory Visit
Admission: RE | Admit: 2021-11-23 | Discharge: 2021-11-23 | Disposition: A | Discharge status: Home / Self Care | Payer: Medicare Other | Source: Ambulatory Visit | Attending: Surgery | Admitting: Surgery

## 2021-11-23 ENCOUNTER — Encounter
Admission: RE | Disposition: A | Discharge status: Home / Self Care | Payer: Self-pay | Source: Ambulatory Visit | Attending: Surgery

## 2021-11-23 ENCOUNTER — Ambulatory Visit: Payer: Medicare Other | Admitting: Anesthesiology

## 2021-11-23 ENCOUNTER — Other Ambulatory Visit: Payer: Self-pay

## 2021-11-23 ENCOUNTER — Encounter: Payer: Self-pay | Admitting: Surgery

## 2021-11-23 DIAGNOSIS — E1122 Type 2 diabetes mellitus with diabetic chronic kidney disease: Secondary | ICD-10-CM | POA: Insufficient documentation

## 2021-11-23 DIAGNOSIS — N184 Chronic kidney disease, stage 4 (severe): Secondary | ICD-10-CM

## 2021-11-23 DIAGNOSIS — K219 Gastro-esophageal reflux disease without esophagitis: Secondary | ICD-10-CM | POA: Diagnosis not present

## 2021-11-23 DIAGNOSIS — K430 Incisional hernia with obstruction, without gangrene: Secondary | ICD-10-CM | POA: Diagnosis present

## 2021-11-23 DIAGNOSIS — Z992 Dependence on renal dialysis: Secondary | ICD-10-CM | POA: Insufficient documentation

## 2021-11-23 DIAGNOSIS — I120 Hypertensive chronic kidney disease with stage 5 chronic kidney disease or end stage renal disease: Secondary | ICD-10-CM | POA: Diagnosis not present

## 2021-11-23 DIAGNOSIS — Z01812 Encounter for preprocedural laboratory examination: Secondary | ICD-10-CM

## 2021-11-23 DIAGNOSIS — N186 End stage renal disease: Secondary | ICD-10-CM

## 2021-11-23 DIAGNOSIS — M1990 Unspecified osteoarthritis, unspecified site: Secondary | ICD-10-CM | POA: Diagnosis not present

## 2021-11-23 DIAGNOSIS — G4730 Sleep apnea, unspecified: Secondary | ICD-10-CM | POA: Diagnosis not present

## 2021-11-23 DIAGNOSIS — D631 Anemia in chronic kidney disease: Secondary | ICD-10-CM

## 2021-11-23 DIAGNOSIS — I10 Essential (primary) hypertension: Secondary | ICD-10-CM

## 2021-11-23 DIAGNOSIS — N185 Chronic kidney disease, stage 5: Secondary | ICD-10-CM

## 2021-11-23 DIAGNOSIS — D693 Immune thrombocytopenic purpura: Secondary | ICD-10-CM

## 2021-11-23 HISTORY — PX: CAPD INSERTION: SHX5233

## 2021-11-23 HISTORY — PX: INSERTION OF MESH: SHX5868

## 2021-11-23 HISTORY — PX: XI ROBOTIC ASSISTED VENTRAL HERNIA: SHX6789

## 2021-11-23 LAB — CBC
HCT: 28.6 % — ABNORMAL LOW (ref 39.0–52.0)
Hemoglobin: 9.1 g/dL — ABNORMAL LOW (ref 13.0–17.0)
MCH: 29.7 pg (ref 26.0–34.0)
MCHC: 31.8 g/dL (ref 30.0–36.0)
MCV: 93.5 fL (ref 80.0–100.0)
Platelets: 113 10*3/uL — ABNORMAL LOW (ref 150–400)
RBC: 3.06 MIL/uL — ABNORMAL LOW (ref 4.22–5.81)
RDW: 15.9 % — ABNORMAL HIGH (ref 11.5–15.5)
WBC: 5.8 10*3/uL (ref 4.0–10.5)
nRBC: 0 % (ref 0.0–0.2)

## 2021-11-23 LAB — POCT I-STAT, CHEM 8
BUN: 37 mg/dL — ABNORMAL HIGH (ref 8–23)
Calcium, Ion: 1.19 mmol/L (ref 1.15–1.40)
Chloride: 105 mmol/L (ref 98–111)
Creatinine, Ser: 6.3 mg/dL — ABNORMAL HIGH (ref 0.61–1.24)
Glucose, Bld: 102 mg/dL — ABNORMAL HIGH (ref 70–99)
HCT: 30 % — ABNORMAL LOW (ref 39.0–52.0)
Hemoglobin: 10.2 g/dL — ABNORMAL LOW (ref 13.0–17.0)
Potassium: 3.4 mmol/L — ABNORMAL LOW (ref 3.5–5.1)
Sodium: 142 mmol/L (ref 135–145)
TCO2: 24 mmol/L (ref 22–32)

## 2021-11-23 LAB — BASIC METABOLIC PANEL
Anion gap: 12 (ref 5–15)
BUN: 45 mg/dL — ABNORMAL HIGH (ref 8–23)
CO2: 23 mmol/L (ref 22–32)
Calcium: 9.5 mg/dL (ref 8.9–10.3)
Chloride: 105 mmol/L (ref 98–111)
Creatinine, Ser: 5.6 mg/dL — ABNORMAL HIGH (ref 0.61–1.24)
GFR, Estimated: 10 mL/min — ABNORMAL LOW (ref >60)
Glucose, Bld: 110 mg/dL — ABNORMAL HIGH (ref 70–99)
Potassium: 3.3 mmol/L — ABNORMAL LOW (ref 3.5–5.1)
Sodium: 140 mmol/L (ref 135–145)

## 2021-11-23 LAB — GLUCOSE, CAPILLARY: Glucose-Capillary: 180 mg/dL — ABNORMAL HIGH (ref 70–99)

## 2021-11-23 SURGERY — REPAIR, HERNIA, VENTRAL, ROBOT-ASSISTED
Anesthesia: General

## 2021-11-23 MED ORDER — FENTANYL CITRATE (PF) 100 MCG/2ML IJ SOLN
INTRAMUSCULAR | Status: DC | PRN
Start: 2021-11-23 — End: 2021-11-23
  Administered 2021-11-23: 50 ug via INTRAVENOUS

## 2021-11-23 MED ORDER — ROCURONIUM BROMIDE 100 MG/10ML IV SOLN
INTRAVENOUS | Status: DC | PRN
  Administered 2021-11-23 (×2): 10 mg via INTRAVENOUS
  Administered 2021-11-23: 20 mg via INTRAVENOUS
  Administered 2021-11-23: 50 mg via INTRAVENOUS

## 2021-11-23 MED ORDER — ACETAMINOPHEN 500 MG PO TABS
ORAL_TABLET | ORAL | Status: AC
Start: 2021-11-23 — End: 2021-11-23
  Administered 2021-11-23: 1000 mg via ORAL
  Filled 2021-11-23: qty 2

## 2021-11-23 MED ORDER — ROCURONIUM BROMIDE 10 MG/ML (PF) SYRINGE
PREFILLED_SYRINGE | INTRAVENOUS | Status: AC
  Filled 2021-11-23: qty 10

## 2021-11-23 MED ORDER — CHLORHEXIDINE GLUCONATE 0.12 % MT SOLN
OROMUCOSAL | Status: AC
  Administered 2021-11-23: 15 mL via OROMUCOSAL
  Filled 2021-11-23: qty 15

## 2021-11-23 MED ORDER — SEVOFLURANE IN SOLN
RESPIRATORY_TRACT | Status: AC
  Filled 2021-11-23: qty 250

## 2021-11-23 MED ORDER — GABAPENTIN 100 MG PO CAPS
ORAL_CAPSULE | ORAL | Status: AC
Start: 2021-11-23 — End: 2021-11-23
  Administered 2021-11-23: 200 mg via ORAL
  Filled 2021-11-23: qty 2

## 2021-11-23 MED ORDER — CHLORHEXIDINE GLUCONATE CLOTH 2 % EX PADS
6.0000 | MEDICATED_PAD | Freq: Once | CUTANEOUS | Status: AC
  Administered 2021-11-23: 6 via TOPICAL

## 2021-11-23 MED ORDER — SODIUM CHLORIDE 0.9 % IV SOLN: INTRAVENOUS | Status: DC

## 2021-11-23 MED ORDER — DEXAMETHASONE SODIUM PHOSPHATE 10 MG/ML IJ SOLN
INTRAMUSCULAR | Status: AC
  Filled 2021-11-23: qty 1

## 2021-11-23 MED ORDER — BUPIVACAINE LIPOSOME 1.3 % IJ SUSP
INTRAMUSCULAR | Status: AC
  Filled 2021-11-23: qty 20

## 2021-11-23 MED ORDER — EPHEDRINE 5 MG/ML INJ
INTRAVENOUS | Status: AC
  Filled 2021-11-23: qty 5

## 2021-11-23 MED ORDER — SODIUM CHLORIDE (PF) 0.9 % IJ SOLN
INTRAMUSCULAR | Status: DC | PRN
  Administered 2021-11-23: 60 mL

## 2021-11-23 MED ORDER — ORAL CARE MOUTH RINSE: 15.0000 mL | Freq: Once | OROMUCOSAL | Status: AC

## 2021-11-23 MED ORDER — BUPIVACAINE-EPINEPHRINE (PF) 0.5% -1:200000 IJ SOLN
INTRAMUSCULAR | Status: AC
  Filled 2021-11-23: qty 30

## 2021-11-23 MED ORDER — CEFAZOLIN SODIUM-DEXTROSE 2-4 GM/100ML-% IV SOLN
INTRAVENOUS | Status: AC
  Filled 2021-11-23: qty 100

## 2021-11-23 MED ORDER — OXYCODONE HCL 5 MG PO TABS: 5.0000 mg | ORAL_TABLET | Freq: Once | ORAL | Status: DC | PRN

## 2021-11-23 MED ORDER — BUPIVACAINE LIPOSOME 1.3 % IJ SUSP: 20.0000 mL | Freq: Once | INTRAMUSCULAR | Status: DC

## 2021-11-23 MED ORDER — CEFAZOLIN SODIUM-DEXTROSE 2-3 GM-%(50ML) IV SOLR
INTRAVENOUS | Status: DC | PRN
  Administered 2021-11-23: 2 g via INTRAVENOUS

## 2021-11-23 MED ORDER — ONDANSETRON HCL 4 MG/2ML IJ SOLN
INTRAMUSCULAR | Status: AC
  Filled 2021-11-23: qty 2

## 2021-11-23 MED ORDER — CEFAZOLIN SODIUM-DEXTROSE 2-4 GM/100ML-% IV SOLN
2.0000 g | INTRAVENOUS | Status: AC
  Administered 2021-11-23: 2 g via INTRAVENOUS

## 2021-11-23 MED ORDER — ACETAMINOPHEN 500 MG PO TABS: 1000.0000 mg | ORAL_TABLET | ORAL | Status: AC

## 2021-11-23 MED ORDER — EPHEDRINE SULFATE (PRESSORS) 50 MG/ML IJ SOLN
INTRAMUSCULAR | Status: DC | PRN
  Administered 2021-11-23: 10 mg via INTRAVENOUS
  Administered 2021-11-23 (×4): 5 mg via INTRAVENOUS
  Administered 2021-11-23 (×2): 10 mg via INTRAVENOUS

## 2021-11-23 MED ORDER — GABAPENTIN 100 MG PO CAPS: 200.0000 mg | ORAL_CAPSULE | ORAL | Status: AC

## 2021-11-23 MED ORDER — 0.9 % SODIUM CHLORIDE (POUR BTL) OPTIME
TOPICAL | Status: DC | PRN
  Administered 2021-11-23: 150 mL

## 2021-11-23 MED ORDER — OXYCODONE HCL 5 MG PO TABS: 5.0000 mg | ORAL_TABLET | ORAL | 0 refills | Status: DC | PRN

## 2021-11-23 MED ORDER — HEPARIN SODIUM (PORCINE) 5000 UNIT/ML IJ SOLN
INTRAMUSCULAR | Status: AC
  Filled 2021-11-23: qty 1

## 2021-11-23 MED ORDER — SUGAMMADEX SODIUM 200 MG/2ML IV SOLN
INTRAVENOUS | Status: DC | PRN
  Administered 2021-11-23: 200 mg via INTRAVENOUS

## 2021-11-23 MED ORDER — DEXAMETHASONE SODIUM PHOSPHATE 10 MG/ML IJ SOLN
INTRAMUSCULAR | Status: DC | PRN
Start: 2021-11-23 — End: 2021-11-23
  Administered 2021-11-23: 10 mg via INTRAVENOUS

## 2021-11-23 MED ORDER — ACETAMINOPHEN 500 MG PO TABS: 1000.0000 mg | ORAL_TABLET | Freq: Four times a day (QID) | ORAL | Status: DC | PRN

## 2021-11-23 MED ORDER — OXYCODONE HCL 5 MG/5ML PO SOLN: 5.0000 mg | Freq: Once | ORAL | Status: DC | PRN

## 2021-11-23 MED ORDER — SODIUM CHLORIDE 0.9 % IV SOLN
INTRAVENOUS | Status: DC | PRN
  Administered 2021-11-23: 1001 mL

## 2021-11-23 MED ORDER — FENTANYL CITRATE (PF) 100 MCG/2ML IJ SOLN
INTRAMUSCULAR | Status: AC
  Filled 2021-11-23: qty 2

## 2021-11-23 MED ORDER — CHLORHEXIDINE GLUCONATE CLOTH 2 % EX PADS: 6.0000 | MEDICATED_PAD | Freq: Once | CUTANEOUS | Status: AC

## 2021-11-23 MED ORDER — FAMOTIDINE 20 MG PO TABS: 20.0000 mg | ORAL_TABLET | Freq: Once | ORAL | Status: DC

## 2021-11-23 MED ORDER — LIDOCAINE HCL (CARDIAC) PF 100 MG/5ML IV SOSY
PREFILLED_SYRINGE | INTRAVENOUS | Status: DC | PRN
  Administered 2021-11-23: 50 mg via INTRAVENOUS

## 2021-11-23 MED ORDER — CHLORHEXIDINE GLUCONATE 0.12 % MT SOLN: 15.0000 mL | Freq: Once | OROMUCOSAL | Status: AC

## 2021-11-23 MED ORDER — SODIUM CHLORIDE FLUSH 0.9 % IV SOLN
INTRAVENOUS | Status: AC
  Filled 2021-11-23: qty 10

## 2021-11-23 MED ORDER — PROPOFOL 10 MG/ML IV BOLUS
INTRAVENOUS | Status: AC
  Filled 2021-11-23: qty 20

## 2021-11-23 MED ORDER — FENTANYL CITRATE (PF) 100 MCG/2ML IJ SOLN: 25.0000 ug | INTRAMUSCULAR | Status: DC | PRN

## 2021-11-23 MED ORDER — LIDOCAINE HCL (PF) 2 % IJ SOLN
INTRAMUSCULAR | Status: AC
  Filled 2021-11-23: qty 5

## 2021-11-23 MED ORDER — PHENYLEPHRINE HCL (PRESSORS) 10 MG/ML IV SOLN
INTRAVENOUS | Status: DC | PRN
  Administered 2021-11-23: 80 ug via INTRAVENOUS

## 2021-11-23 MED ORDER — DEXMEDETOMIDINE HCL IN NACL 200 MCG/50ML IV SOLN
INTRAVENOUS | Status: DC | PRN
  Administered 2021-11-23: 4 ug via INTRAVENOUS

## 2021-11-23 MED ORDER — PROPOFOL 10 MG/ML IV BOLUS
INTRAVENOUS | Status: DC | PRN
  Administered 2021-11-23: 120 mg via INTRAVENOUS

## 2021-11-23 SURGICAL SUPPLY — 80 items
ADAPTER CATH DIALYSIS 4X8 IT L (MISCELLANEOUS) ×3 IMPLANT
ADH SKN CLS APL DERMABOND .7 (GAUZE/BANDAGES/DRESSINGS) ×2
BAG RETRIEVAL 10 (BASKET) ×1
BIOPATCH WHT 1IN DISK W/4.0 H (GAUZE/BANDAGES/DRESSINGS) ×1 IMPLANT
BLADE SURG 11 STRL SS SAFETY (MISCELLANEOUS) ×3 IMPLANT
BLADE SURG SZ11 CARB STEEL (BLADE) ×3 IMPLANT
CANNULA REDUC XI 12-8 STAPL (CANNULA) ×1
CANNULA REDUCER 12-8 DVNC XI (CANNULA) ×2 IMPLANT
CATH COUDE FOLEY 2W 5CC 18FR (CATHETERS) ×1 IMPLANT
CATH EXTENDED DIALYSIS (CATHETERS) ×3 IMPLANT
COVER TIP SHEARS 8 DVNC (MISCELLANEOUS) ×2 IMPLANT
COVER TIP SHEARS 8MM DA VINCI (MISCELLANEOUS) ×1
DERMABOND ADVANCED (GAUZE/BANDAGES/DRESSINGS) ×1
DERMABOND ADVANCED .7 DNX12 (GAUZE/BANDAGES/DRESSINGS) ×2 IMPLANT
DRAPE ARM DVNC X/XI (DISPOSABLE) ×6 IMPLANT
DRAPE COLUMN DVNC XI (DISPOSABLE) ×2 IMPLANT
DRAPE DA VINCI XI ARM (DISPOSABLE) ×3
DRAPE DA VINCI XI COLUMN (DISPOSABLE) ×1
ELECT CAUTERY BLADE 6.4 (BLADE) ×3 IMPLANT
ELECT CAUTERY BLADE TIP 2.5 (TIP) ×3
ELECT REM PT RETURN 9FT ADLT (ELECTROSURGICAL) ×3
ELECTRODE CAUTERY BLDE TIP 2.5 (TIP) ×2 IMPLANT
ELECTRODE REM PT RTRN 9FT ADLT (ELECTROSURGICAL) ×2 IMPLANT
GAUZE SPONGE 4X4 12PLY STRL (GAUZE/BANDAGES/DRESSINGS) ×1 IMPLANT
GLOVE SURG SYN 7.0 (GLOVE) ×12 IMPLANT
GLOVE SURG SYN 7.0 PF PI (GLOVE) ×4 IMPLANT
GLOVE SURG SYN 7.5  E (GLOVE) ×4
GLOVE SURG SYN 7.5 E (GLOVE) ×8 IMPLANT
GLOVE SURG SYN 7.5 PF PI (GLOVE) ×4 IMPLANT
GOWN STRL REUS W/ TWL LRG LVL3 (GOWN DISPOSABLE) ×6 IMPLANT
GOWN STRL REUS W/TWL LRG LVL3 (GOWN DISPOSABLE) ×12
GRASPER SUT TROCAR 14GX15 (MISCELLANEOUS) ×3 IMPLANT
IV NS 1000ML (IV SOLUTION) ×3
IV NS 1000ML BAXH (IV SOLUTION) ×2 IMPLANT
KIT PINK PAD W/HEAD ARE REST (MISCELLANEOUS) ×3
KIT PINK PAD W/HEAD ARM REST (MISCELLANEOUS) ×2 IMPLANT
KIT TURNOVER KIT A (KITS) ×3 IMPLANT
L-HOOK LAP DISP 36CM (ELECTROSURGICAL) ×3
LABEL OR SOLS (LABEL) ×3 IMPLANT
LHOOK LAP DISP 36CM (ELECTROSURGICAL) IMPLANT
MANIFOLD NEPTUNE II (INSTRUMENTS) ×3 IMPLANT
MESH VENT LT ST 15CM CRL ECHO2 (Mesh General) ×1 IMPLANT
MINICAP W/POVIDONE IODINE SOL (MISCELLANEOUS) ×3 IMPLANT
NDL INSUFFLATION 14GA 120MM (NEEDLE) ×2 IMPLANT
NEEDLE HYPO 22GX1.5 SAFETY (NEEDLE) ×3 IMPLANT
NEEDLE INSUFFLATION 14GA 120MM (NEEDLE) ×3 IMPLANT
NS IRRIG 500ML POUR BTL (IV SOLUTION) ×3 IMPLANT
OBTURATOR OPTICAL STANDARD 8MM (TROCAR) ×1
OBTURATOR OPTICAL STND 8 DVNC (TROCAR) ×2
OBTURATOR OPTICALSTD 8 DVNC (TROCAR) ×2 IMPLANT
PACK LAP CHOLECYSTECTOMY (MISCELLANEOUS) ×3 IMPLANT
PENCIL ELECTRO HAND CTR (MISCELLANEOUS) ×3 IMPLANT
SEAL CANN UNIV 5-8 DVNC XI (MISCELLANEOUS) ×4 IMPLANT
SEAL XI 5MM-8MM UNIVERSAL (MISCELLANEOUS) ×2
SET CYSTO W/LG BORE CLAMP LF (SET/KITS/TRAYS/PACK) ×3 IMPLANT
SET TRANSFER 6 W/TWIST CLAMP 5 (SET/KITS/TRAYS/PACK) ×3 IMPLANT
SET TUBE SMOKE EVAC HIGH FLOW (TUBING) ×3 IMPLANT
SOLUTION ELECTROLUBE (MISCELLANEOUS) ×3 IMPLANT
SPONGE DRAIN TRACH 4X4 STRL 2S (GAUZE/BANDAGES/DRESSINGS) ×3 IMPLANT
STAPLER CANNULA SEAL DVNC XI (STAPLE) ×2 IMPLANT
STAPLER CANNULA SEAL XI (STAPLE) ×1
STYLET FALLER (MISCELLANEOUS) ×1 IMPLANT
STYLET FALLER MEDIONICS (MISCELLANEOUS) ×1 IMPLANT
SUT ETHILON 2 0 FS 18 (SUTURE) ×3 IMPLANT
SUT MNCRL 4-0 (SUTURE) ×6
SUT MNCRL 4-0 27XMFL (SUTURE) ×4
SUT PROLENE 2 0 FS (SUTURE) ×1 IMPLANT
SUT STRATAFIX PDS 30 CT-1 (SUTURE) ×3 IMPLANT
SUT VIC AB 3-0 SH 27 (SUTURE) ×3
SUT VIC AB 3-0 SH 27X BRD (SUTURE) ×2 IMPLANT
SUT VICRYL 0 AB UR-6 (SUTURE) ×7 IMPLANT
SUT VLOC 90 2/L VL 12 GS22 (SUTURE) ×6 IMPLANT
SUT VLOC 90 S/L VL9 GS22 (SUTURE) ×4 IMPLANT
SUTURE MNCRL 4-0 27XMF (SUTURE) ×2 IMPLANT
SYS BAG RETRIEVAL 10MM (BASKET) ×2
SYSTEM BAG RETRIEVAL 10MM (BASKET) IMPLANT
TAPE CLOTH SURG 4X10 WHT LF (GAUZE/BANDAGES/DRESSINGS) ×1 IMPLANT
TRAY FOLEY SLVR 16FR LF STAT (SET/KITS/TRAYS/PACK) ×3 IMPLANT
TROCAR 5M 150ML BLDLS (TROCAR) ×1 IMPLANT
WAND RF SURG SPNG DETECT SYS (INSTRUMENTS) ×3 IMPLANT

## 2021-11-23 NOTE — Anesthesia Preprocedure Evaluation (Signed)
Anesthesia Evaluation  ?Patient identified by MRN, date of birth, ID band ?Patient awake ? ? ? ?Reviewed: ?Allergy & Precautions, NPO status , Patient's Chart, lab work & pertinent test results ? ?History of Anesthesia Complications ?Negative for: history of anesthetic complications ? ?Airway ?Mallampati: III ? ?TM Distance: <3 FB ?Neck ROM: full ? ? ? Dental ? ?(+) Chipped, Poor Dentition, Missing ?  ?Pulmonary ?neg shortness of breath, sleep apnea ,  ?  ?Pulmonary exam normal ? ? ? ? ? ? ? Cardiovascular ?Exercise Tolerance: Good ?hypertension, (-) angina(-) Past MI Normal cardiovascular exam ? ? ?  ?Neuro/Psych ?negative neurological ROS ? negative psych ROS  ? GI/Hepatic ?Neg liver ROS, GERD  Controlled,  ?Endo/Other  ?diabetes, Type 2 ? Renal/GU ?Renal disease  ? ?  ?Musculoskeletal ? ?(+) Arthritis ,  ? Abdominal ?  ?Peds ? Hematology ?negative hematology ROS ?(+)   ?Anesthesia Other Findings ?Past Medical History: ?No date: Anemia ?No date: Arthritis ?No date: Cancer [facility]) ?No date: Chronic kidney disease ?    Comment:  Stage 4 CKD ?No date: GERD (gastroesophageal reflux disease) ?    Comment:  h/o ?No date: Gout ?No date: Hyperlipidemia, mixed ?No date: Hypertension ?No date: Sleep apnea ?    Comment:  h/o no cpap ? ?Past Surgical History: ?No date: APPENDECTOMY ?No date: Bunion Removal; Right ?06/14/2015: COLONOSCOPY WITH PROPOFOL; N/A ?    Comment:  Procedure: COLONOSCOPY WITH PROPOFOL;  Surgeon: Billie Ruddy ?             Gustavo Lah, MD;  Location: [facility] ENDOSCOPY;  Service:  ?             Endoscopy;  Laterality: N/A; ?10/03/2021: DIALYSIS/PERMA CATHETER INSERTION; N/A ?    Comment:  Procedure: DIALYSIS/PERMA CATHETER INSERTION;  Surgeon:  ?             Algernon Huxley, MD;  Location: [place] CV LAB;   ?             Service: Cardiovascular;  Laterality: N/A; ?No date: EYE SURGERY ?No date: HERNIA REPAIR ?    Comment:  abd ?03/27/2017: PROSTATE BIOPSY; N/A ?    Comment:   Procedure: PROSTATE BIOPSY-URO NAV;  Surgeon: Yves Dill,  ?             Otelia Limes, MD;  Location: [facility] ORS;  Service: Urology;   ?             Laterality: N/A; ?09/11/2019: PROSTATE BIOPSY; N/A ?    Comment:  Procedure: PROSTATE BIOPSY URONAV FUSION;  Surgeon:  ?             Royston Cowper, MD;  Location: [facility] ORS;  Service:  ?             Urology;  Laterality: N/A; ?No date: RETINAL DETACHMENT SURGERY; Left ?No date: TRANSURETHRAL RESECTION OF PROSTATE ? ?BMI   ? Body Mass Index: 27.48 kg/m?  ?  ? ? Reproductive/Obstetrics ?negative OB ROS ? ?  ? ? ? ? ? ? ? ? ? ? ? ? ? ?  ?  ? ? ? ? ? ? ? ? ?Anesthesia Physical ?Anesthesia Plan ? ?ASA: 3 ? ?Anesthesia Plan: General ETT  ? ?Post-op Pain Management:   ? ?Induction: Intravenous ? ?PONV Risk Score and Plan: Ondansetron, Dexamethasone, Midazolam and Treatment may vary due to age or medical condition ? ?Airway Management Planned: Oral ETT ? ?Additional Equipment:  ? ?Intra-op Plan:  ? ?Post-operative  Plan: Extubation in OR ? ?Informed Consent: I have reviewed the patients History and Physical, chart, labs and discussed the procedure including the risks, benefits and alternatives for the proposed anesthesia with the patient or authorized representative who has indicated his/her understanding and acceptance.  ? ? ? ?Dental Advisory Given ? ?Plan Discussed with: Anesthesiologist, CRNA and Surgeon ? ?Anesthesia Plan Comments: (Patient consented for risks of anesthesia including but not limited to:  ?- adverse reactions to medications ?- damage to eyes, teeth, lips or other oral mucosa ?- nerve damage due to positioning  ?- sore throat or hoarseness ?- Damage to heart, brain, nerves, lungs, other parts of body or loss of life ? ?Patient voiced understanding.)  ? ? ? ? ? ? ?Anesthesia Quick Evaluation ? ?

## 2021-11-23 NOTE — Brief Op Note (Signed)
11/23/2021 ? ?6:00 PM ? ?PATIENT:  [name] [name]  81 y.o. male ? 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 ?PROCEDURE:  Procedure(s): ?XI ROBOTIC ASSISTED VENTRAL HERNIA, incisional (N/A) ?LAPAROSCOPIC INSERTION CONTINUOUS AMBULATORY PERITONEAL DIALYSIS  (CAPD) CATHETER (N/A) ?INSERTION OF MESH ? ?SURGEON:  Surgeon(s) and Role: ?   Olean Ree, MD - Primary ? ?ASSISTANTS: Rinaldo Cloud, PA-S  ? ?ANESTHESIA:   general ? ?EBL:  20 ml  ? ?BLOOD ADMINISTERED:none ? ?DRAINS:  Peritoneal dialysis catheter   ? ?LOCAL MEDICATIONS USED:  BUPIVICAINE  ? ?SPECIMEN:  No Specimen ? ?DISPOSITION OF SPECIMEN:  N/A ? ?COUNTS:  YES ? ?DICTATION: .Dragon Dictation ? ?PLAN OF CARE: Discharge to home after PACU ? ?PATIENT DISPOSITION:  PACU - hemodynamically stable. ?  ?Delay start of Pharmacological VTE agent (>24hrs) due to surgical blood loss or risk of bleeding: yes ? ?

## 2021-11-23 NOTE — Transfer of Care (Signed)
Immediate Anesthesia Transfer of Care Note ? ?Patient: [name] [name] ? ?Procedure(s) Performed: XI ROBOTIC ASSISTED VENTRAL HERNIA, incisional ?LAPAROSCOPIC INSERTION CONTINUOUS AMBULATORY PERITONEAL DIALYSIS  (CAPD) CATHETER ?INSERTION OF MESH ? ?Patient Location: PACU ? ?Anesthesia Type:General ? ?Level of Consciousness: awake ? ?Airway & Oxygen Therapy: Patient Spontanous Breathing and Patient connected to face mask oxygen ? ?Post-op Assessment: Report given to RN and Post -op Vital signs reviewed and stable ? ?Post vital signs: Reviewed and stable ? ?Last Vitals:  ?Vitals Value Taken Time  ?BP 120/56 11/23/21 1800  ?Temp    ?Pulse 66 11/23/21 1806  ?Resp 13 11/23/21 1806  ?SpO2 100 % 11/23/21 1806  ?Vitals shown include unvalidated device data. ? ?Last Pain:  ?Vitals:  ? 11/23/21 1139  ?TempSrc: Oral  ?PainSc: 0-No pain  ?   ? ?  ? ?Complications: No notable events documented. ?

## 2021-11-23 NOTE — Anesthesia Postprocedure Evaluation (Signed)
Anesthesia Post Note ? ?Patient: Giann Obara ? ?Procedure(s) Performed: XI ROBOTIC ASSISTED VENTRAL HERNIA, incisional ?LAPAROSCOPIC INSERTION CONTINUOUS AMBULATORY PERITONEAL DIALYSIS  (CAPD) CATHETER ?INSERTION OF MESH ? ?Patient location during evaluation: PACU ?Anesthesia Type: General ?Level of consciousness: awake and alert ?Pain management: pain level controlled ?Vital Signs Assessment: post-procedure vital signs reviewed and stable ?Respiratory status: spontaneous breathing, nonlabored ventilation, respiratory function stable and patient connected to nasal cannula oxygen ?Cardiovascular status: blood pressure returned to baseline and stable ?Postop Assessment: no apparent nausea or vomiting ?Anesthetic complications: no ? ? ?No notable events documented. ? ? ?Last Vitals:  ?Vitals:  ? 11/23/21 1902 11/23/21 1916  ?BP: 123/73 136/70  ?Pulse: 64 70  ?Resp: 16 18  ?Temp: (!) 36.3 ?C (!) 36.1 ?C  ?SpO2: 99% 100%  ?  ?Last Pain:  ?Vitals:  ? 11/23/21 1916  ?TempSrc: Temporal  ?PainSc: 0-No pain  ? ? ?  ?  ?  ?  ?  ?  ? ?Martha Clan ? ? ? ? ?

## 2021-11-23 NOTE — Interval H&P Note (Signed)
History and Physical Interval Note: ? ?11/23/2021 ?1:00 PM ? 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  The various methods of treatment have been discussed with the patient and family. After consideration of risks, benefits and other options for treatment, the patient has consented to  Procedure(s): ?XI ROBOTIC [place], incisional (N/A) ?LAPAROSCOPIC INSERTION CONTINUOUS AMBULATORY PERITONEAL DIALYSIS  (CAPD) CATHETER (N/A) as a surgical intervention.  The patient's history has been reviewed, patient examined, no change in status, stable for surgery.  I have reviewed the patient's chart and labs.  Questions were answered to the patient's satisfaction.   ? ? ?[name] [name] ? ? ?

## 2021-11-23 NOTE — Progress Notes (Signed)
Per Dr. Hampton Abbot and Dr. Erlene Quan, ok to remove foley catheter after one hour, urine clear/yellow, Removed at 1900 without event, small blood clot at tip of penis, no bleeding in catheter tubing prior to removal. Patient tolerated without event, plan of care reviewed with patient. Family (daughter) spoke at length with Dr. Hampton Abbot, already went to pick up pain medications.  ?

## 2021-11-23 NOTE — Discharge Instructions (Addendum)
Discharge Instructions: ?1.  Patient may shower in 48 hrs, but do not scrub wounds heavily and dab dry only. ?2.  Do not submerge wounds in pool/tub until fully healed. ?3.  Do not apply ointments or hydrogen peroxide to the wounds. ?4.  May apply ice packs to the wounds for comfort. ?5.  May resume your Aspirin on 11/25/21 ?6.  Please contact your dialysis center for dressing change information and instruction.AMBULATORY SURGERY  ?DISCHARGE INSTRUCTIONS ? ? ?The drugs that you were given will stay in your system until tomorrow so for the next 24 hours you should not: ? ?Drive an automobile ?Make any legal decisions ?Drink any alcoholic beverage ? ? ?You may resume regular meals tomorrow.  Today it is better to start with liquids and gradually work up to solid foods. ? ?You may eat anything you prefer, but it is better to start with liquids, then soup and crackers, and gradually work up to solid foods. ? ? ?Please notify your doctor immediately if you have any unusual bleeding, trouble breathing, redness and pain at the surgery site, drainage, fever, or pain not relieved by medication. ?  ? ?Your post-operative visit with Dr.                     ? ? ?           ?     is: Date:                        Time:   ? ?Please call to schedule your post-operative visit. ? ?Additional Instructions:  ?

## 2021-11-23 NOTE — Anesthesia Procedure Notes (Signed)
Procedure Name: Intubation ?Date/Time: 11/23/2021 1:33 PM ?Performed by: Debe Coder, CRNA ?Pre-anesthesia Checklist: Patient identified, Emergency Drugs available, Suction available and Patient being monitored ?Patient Re-evaluated:Patient Re-evaluated prior to induction ?Oxygen Delivery Method: Circle system utilized ?Preoxygenation: Pre-oxygenation with 100% oxygen ?Induction Type: IV induction ?Ventilation: Mask ventilation without difficulty ?Laryngoscope Size: McGraph and 3 ?Grade View: Grade I ?Tube type: Oral ?Tube size: 7.5 mm ?Number of attempts: 1 ?Airway Equipment and Method: Stylet and Oral airway ?Placement Confirmation: ETT inserted through vocal cords under direct vision, positive ETCO2 and breath sounds checked- equal and bilateral ?Secured at: 21 cm ?Tube secured with: Tape ?Dental Injury: Teeth and Oropharynx as per pre-operative assessment  ? ? ? ? ?

## 2021-11-23 NOTE — Op Note (Signed)
?Procedure Date:  11/23/2021 ? ?Pre-operative Diagnosis:  Incarcerated Incisional hernia; ESRD ? ?Post-operative Diagnosis: Incarcerated Incisional hernias x 2, total defect size 6 cm; ESRD ? ?Procedure:  Robotic assisted Incarcerated incisional Hernia Repair with mesh; Laparoscopic assisted peritoneal dialysis catheter placement with partial omentectomy and omentopexy. ? ?Surgeon:  Melvyn Neth, MD ? ?Assistant:  Rinaldo Cloud, PA-S ? ?Anesthesia:  General endotracheal ? ?Estimated Blood Loss:  20 ml ? ?Specimens:  None ? ?Complications:  Bleeding from penile meatus after foley catheter removed.  New 18 Fr. Coude catheter was inserted after discussion with Dr. Erlene Quan. ? ?Indications for Procedure:  This is a 81 y.o. male who presents with ESRD in need for peritoneal dialysis and with history of incarcerated incisional hernia.  The options of surgery versus observation were reviewed with the patient and/or family. The risks of bleeding, abscess or infection, recurrence of symptoms, potential for an open procedure, injury to surrounding structures, and chronic pain were all discussed with the patient and was willing to proceed. ? ?Description of Procedure: ?The patient was correctly identified in the preoperative area and brought into the operating room.  The patient was placed supine with VTE prophylaxis in place.  Appropriate time-outs were performed.  Anesthesia was induced and the patient was intubated.  Appropriate antibiotics were infused.  Foley catheter was inserted without issues. ? ?The abdomen was prepped and draped in a sterile fashion. The patient's hernia defect was marked with a marking pen.  A Veress needle was introduced in the right upper quadrant and pneumoperitoneum was obtained with appropriate pressures.  Using Optiview technique, an 8 mm port was introduced in the right lateral abdominal wall without complications.  Then, a 12 mm port was introduced in the right upper quadrant and an 8 mm  port in the right lower quadrant under direct visualization.  The DaVinci platform was docked, camera targeted, and instruments placed under direct visualization. ? ?The patient was noted to have two hernias, one which appeared to contain a significant portion of the omentum and the other which contained preperitoneal fat.  The patient's hernias were fully reduced and the peritoneum and preperitoneal fat were dissected and resected to allow better exposure of the hernia defects and for better mesh placement.  After reduction of the omentum, it was noted to have some areas of irritation/inflammation from the chronic incarceration.  It was also fairly torn in different areas, so it was decided to resect the affected or abnormal areas of omentum.  This was done with combination of cautery and scissors.  Then, the hernia defects were measured to be about 6 cm in total length.  A 4 x 6 inch Bard Ventralight ST Echo 2 mesh, a 0 Stratafix suture, and three 2-0 V-loc sutures were inserted through the 12 mm port under direct visualization.  The hernia defects were closed using the stratafix suture.  A PMI was brought through the center of the hernia defects and the positioning system of the mesh was passed through.  This allowed the mesh to splay open and be centered over the repair site with good overlap.  The mesh was then sutured in place circumferentially and through the center of the mesh using the V-loc sutures.  All needles and the positioning system were then removed through the 12 mm port without complications.  The DaVinci platform was then undocked and instruments removed.   ? ?We then relieved the pneumoperitoneum temporarily so we could measure the peritoneal dialysis catheter over the abdominal  wall based on the landmarks to appropriately mark the incisions for the catheter insertion and tunneling.  Pneumoperitoneum was again started.  A left periumbilical incision was made and cautery used to dissect down to  the anterior rectus sheath.  The sheath was incised and a long 5 mm port was introduced and tunneled over the posterior sheath about 5-7 cm distally and then pushed through into the abdominal cavity.  The peritoneal dialysis catheter was then inserted through this tunnel and brought down to the pelvis.  The first cuff was positioned to that it would be just visible at the anterior rectus sheath.  Pneumoperitoneum was again released and the catheter and the extension tubing were cut to appropriate size, connected with the titanium connector, tunneled subcutaneously superiorly from the periumbilical incision to a left subcostal incision, and then tunneled again inferiorly and laterally to the catheter exit site, about 4 cm distal to the third cuff.  The catheter was then connected to a 1 L bag of heparinized saline and tested for inflow and outflow.  Initially there was issue with outflow, and pneumoperitoneum was again obtained and the catheter appeared to be in good position.  After changing patient positioning, outflow was good again.  The bag was detached and the PD catheter end cap was placed.  The rectus sheath was secured around the catheter cuff using 0 Vicryl suture. ? ?60 ml of Exparel solution mixed with 0.5% bupivacaine with epi was infiltrated around the mesh edges, hernia repair site, port sites and incisions made for the PD catheter placement.  The 12 mm port was removed and the fascia was closed under direct visualization utilizing an Endo Close technique with 0 Vicryl suture.  The 8 mm ports were removed. All incisions were closed using 3-0 Vicryl and 4-0 Monocryl.  The wounds were cleaned and sealed with DermaBond.  The catheter exit site was dressed with a BioPatch, and then with 4x4 gauze and porous tape. ? ?After drapes were removed, foley catheter was removed and the patient was noted to have frank blood per penile meatus.  I contacted Dr. Erlene Quan with Urology who recommended placing an 18 Fr.  Coude catheter, which would help place tamponade pressure over the prostate as the likely source of the bleeding.  Once catheter was inserted, some blood tinged urine started flowing. ? ?The patient was emerged from anesthesia and extubated and brought to the recovery room for further management.  By the time he arrived in PACU, his urine was clearing up. ? ?The patient tolerated the procedure well and all counts were correct at the end of the case. ? ? ?Melvyn Neth, MD  ?

## 2021-11-25 ENCOUNTER — Encounter: Payer: Self-pay | Admitting: Surgery

## 2021-11-27 ENCOUNTER — Emergency Department
Admission: EM | Admit: 2021-11-27 | Discharge: 2021-11-27 | Disposition: A | Discharge status: Home / Self Care | Payer: Medicare Other | Attending: Emergency Medicine | Admitting: Emergency Medicine

## 2021-11-27 ENCOUNTER — Emergency Department: Payer: Medicare Other

## 2021-11-27 ENCOUNTER — Other Ambulatory Visit: Payer: Self-pay

## 2021-11-27 DIAGNOSIS — Z992 Dependence on renal dialysis: Secondary | ICD-10-CM | POA: Diagnosis not present

## 2021-11-27 DIAGNOSIS — S30812A Abrasion of penis, initial encounter: Secondary | ICD-10-CM | POA: Diagnosis not present

## 2021-11-27 DIAGNOSIS — R4182 Altered mental status, unspecified: Secondary | ICD-10-CM | POA: Diagnosis not present

## 2021-11-27 DIAGNOSIS — X58XXXA Exposure to other specified factors, initial encounter: Secondary | ICD-10-CM | POA: Insufficient documentation

## 2021-11-27 DIAGNOSIS — S3994XA Unspecified injury of external genitals, initial encounter: Secondary | ICD-10-CM | POA: Diagnosis present

## 2021-11-27 DIAGNOSIS — N186 End stage renal disease: Secondary | ICD-10-CM | POA: Insufficient documentation

## 2021-11-27 DIAGNOSIS — R1084 Generalized abdominal pain: Secondary | ICD-10-CM | POA: Diagnosis not present

## 2021-11-27 LAB — CBC WITH DIFFERENTIAL/PLATELET
Abs Immature Granulocytes: 0.02 10*3/uL (ref 0.00–0.07)
Basophils Absolute: 0 10*3/uL (ref 0.0–0.1)
Basophils Relative: 0 %
Eosinophils Absolute: 0.4 10*3/uL (ref 0.0–0.5)
Eosinophils Relative: 6 %
HCT: 32.4 % — ABNORMAL LOW (ref 39.0–52.0)
Hemoglobin: 9.8 g/dL — ABNORMAL LOW (ref 13.0–17.0)
Immature Granulocytes: 0 %
Lymphocytes Relative: 25 %
Lymphs Abs: 1.7 10*3/uL (ref 0.7–4.0)
MCH: 29.5 pg (ref 26.0–34.0)
MCHC: 30.2 g/dL (ref 30.0–36.0)
MCV: 97.6 fL (ref 80.0–100.0)
Monocytes Absolute: 0.5 10*3/uL (ref 0.1–1.0)
Monocytes Relative: 8 %
Neutro Abs: 4.3 10*3/uL (ref 1.7–7.7)
Neutrophils Relative %: 61 %
Platelets: 125 10*3/uL — ABNORMAL LOW (ref 150–400)
RBC: 3.32 MIL/uL — ABNORMAL LOW (ref 4.22–5.81)
RDW: 14.7 % (ref 11.5–15.5)
WBC: 7 10*3/uL (ref 4.0–10.5)
nRBC: 0 % (ref 0.0–0.2)

## 2021-11-27 LAB — COMPREHENSIVE METABOLIC PANEL
ALT: <5 U/L (ref 0–44)
AST: 14 U/L — ABNORMAL LOW (ref 15–41)
Albumin: 3.4 g/dL — ABNORMAL LOW (ref 3.5–5.0)
Alkaline Phosphatase: 73 U/L (ref 38–126)
Anion gap: 9 (ref 5–15)
BUN: 38 mg/dL — ABNORMAL HIGH (ref 8–23)
CO2: 26 mmol/L (ref 22–32)
Calcium: 9.8 mg/dL (ref 8.9–10.3)
Chloride: 104 mmol/L (ref 98–111)
Creatinine, Ser: 4.93 mg/dL — ABNORMAL HIGH (ref 0.61–1.24)
GFR, Estimated: 11 mL/min — ABNORMAL LOW (ref >60)
Glucose, Bld: 124 mg/dL — ABNORMAL HIGH (ref 70–99)
Potassium: 3.4 mmol/L — ABNORMAL LOW (ref 3.5–5.1)
Sodium: 139 mmol/L (ref 135–145)
Total Bilirubin: 0.9 mg/dL (ref 0.3–1.2)
Total Protein: 6.8 g/dL (ref 6.5–8.1)

## 2021-11-27 LAB — URINALYSIS, ROUTINE W REFLEX MICROSCOPIC
Bacteria, UA: NONE SEEN
RBC / HPF: >50 RBC/hpf — ABNORMAL HIGH (ref 0–5)
Squamous Epithelial / HPF: NONE SEEN (ref 0–5)
WBC, UA: >50 WBC/hpf — ABNORMAL HIGH (ref 0–5)

## 2021-11-27 LAB — LIPASE, BLOOD: Lipase: 34 U/L (ref 11–51)

## 2021-11-27 MED ORDER — HALOPERIDOL LACTATE 5 MG/ML IJ SOLN
5.0000 mg | Freq: Once | INTRAMUSCULAR | Status: AC
  Administered 2021-11-27: 5 mg via INTRAVENOUS
  Filled 2021-11-27: qty 1

## 2021-11-27 MED ORDER — CEPHALEXIN 500 MG PO CAPS
500.0000 mg | ORAL_CAPSULE | Freq: Once | ORAL | Status: AC
  Administered 2021-11-27: 500 mg via ORAL
  Filled 2021-11-27: qty 1

## 2021-11-27 MED ORDER — CEPHALEXIN 500 MG PO CAPS: 500.0000 mg | ORAL_CAPSULE | Freq: Two times a day (BID) | ORAL | 0 refills | Status: DC

## 2021-11-27 NOTE — ED Provider Notes (Signed)
? ?[facility] ?Provider Note ? ? ? Event Date/Time  ? First MD Initiated Contact with Patient 11/27/21 [id number]   ?  (approximate) ? ? ?History  ? ?Abdominal Pain ? ? ?HPI ? ?[name] [name] is a 81 y.o. male with a past history of end-stage renal disease on hemodialysis, last session 2 days ago on Friday, who comes ED complaining of penile bleeding this morning that he noticed on waking up.  He does report some lower abdominal pain.  No fever vomiting or diarrhea.  No dysuria. ? ?Recently had a procedure to place a peritoneal dialysis catheter. ?  ? ? ?Physical Exam  ? ?Triage Vital Signs: ?ED Triage Vitals  ?Enc Vitals Group  ?   BP 11/27/21 0818 (!) 171/68  ?   Pulse Rate 11/27/21 0818 65  ?   Resp 11/27/21 0818 14  ?   Temp 11/27/21 0818 97.8 ?F (36.6 ?C)  ?   Temp Source 11/27/21 0818 Oral  ?   SpO2 11/27/21 0818 100 %  ?   Weight 11/27/21 0815 186 lb 1.1 oz (84.4 kg)  ?   Height 11/27/21 0815 '5\' 9"'$  (1.753 m)  ?   Head Circumference --   ?   Peak Flow --   ?   Pain Score 11/27/21 0815 10  ?   Pain Loc --   ?   Pain Edu? --   ?   Excl. in [place]? --   ? ? ?Most recent vital signs: ?Vitals:  ? 11/27/21 1230 11/27/21 1300  ?BP: (!) 148/74 (!) 144/72  ?Pulse: 62 66  ?Resp: 17 18  ?Temp:    ?SpO2: 100% 100%  ? ? ? ?General: Awake, no distress.  ?CV:  Good peripheral perfusion.  Regular rate and rhythm ?Resp:  Normal effort.  Clear to auscultation bilaterally ?Abd:  No distention.  Surgical incisions from PD catheter placement are closed, well-healing, not inflamed.  Abdomen is soft, minimal suprapubic tenderness. ?Other:  General exam performed with nurse Crystal at bedside.  Uncircumcised male.  There are very small abrasions over the distal foreskin with a small amount of dried blood present.  Abrasions have the appearance of being due to fingernail scratching or possibly the skin being caught in a zipper.  Foreskin is not swollen, easily retracted.  Head of the penis is nontender without any blood  at the meatus or other discharge.  No lymphadenopathy or other lesions. ? ? ?ED Results / Procedures / Treatments  ? ?Labs ?(all labs ordered are listed, but only abnormal results are displayed) ?Labs Reviewed  ?CBC WITH DIFFERENTIAL/PLATELET - Abnormal; Notable for the following components:  ?    Result Value  ? RBC 3.32 (*)   ? Hemoglobin 9.8 (*)   ? HCT 32.4 (*)   ? Platelets 125 (*)   ? All other components within normal limits  ?COMPREHENSIVE METABOLIC PANEL - Abnormal; Notable for the following components:  ? Potassium 3.4 (*)   ? Glucose, Bld 124 (*)   ? BUN 38 (*)   ? Creatinine, Ser 4.93 (*)   ? Albumin 3.4 (*)   ? AST 14 (*)   ? GFR, Estimated 11 (*)   ? All other components within normal limits  ?LIPASE, BLOOD  ?URINALYSIS, ROUTINE W REFLEX MICROSCOPIC  ?SAMPLE TO BLOOD BANK  ? ? ? ?EKG ? ? ? ? ?RADIOLOGY ? ? ? ? ?PROCEDURES: ? ?Critical Care performed: No ? ?Procedures ? ? ?MEDICATIONS ORDERED IN ED: ?  Medications  ?haloperidol lactate (HALDOL) injection 5 mg (5 mg Intravenous Given 11/27/21 1001)  ? ? ? ?IMPRESSION / MDM / ASSESSMENT AND PLAN / ED COURSE  ?I reviewed the triage vital signs and the nursing notes. ?             ?               ? ?Differential diagnosis includes, but is not limited to, skin abrasion, cystitis, electrolyte abnormality ? ? ? ?Patient presents with concern for penile bleeding, found to have superficial foreskin abrasion as the source of bleeding.  Bleeding is resolved and this can be expected to heal on its own without intervention.  He does have a history of end-stage renal disease and has some lower abdominal tenderness which is most likely related to his recent PD catheter placement procedure, and his exam is not consistent with peritonitis, intra-abdominal abscess, intestinal perforation, or SBO.  Will check serum labs and urinalysis (if able) for signs of cystitis or electrolyte abnormalities. ? ?----------------------------------------- ?1:52 PM on  11/27/2021 ?----------------------------------------- ?Patient was expressing substantial abdominal pain which I think is postoperative from his recent catheter placement.  CT head and abdomen pelvis obtained which are unremarkable.  Labs do not show any acute issues.  Patient request discharge and I think that he does not require hospitalization and is stable for discharge home at this time to continue following up with his doctors. ?  ? ? ?FINAL CLINICAL IMPRESSION(S) / ED DIAGNOSES  ? ?Final diagnoses:  ?Generalized abdominal pain  ?ESRD on hemodialysis ([place])  ? ? ? ?Rx / DC Orders  ? ?ED Discharge Orders   ? ? None  ? ?  ? ? ? ?Note:  This document was prepared using Dragon voice recognition software and may include unintentional dictation errors. ?  ?Carrie Mew, MD ?11/27/21 1353 ? ?

## 2021-11-27 NOTE — ED Notes (Signed)
RN to bedside to DC patient. Pt was standing in a puddle of blood coming from his penis. MD stafford called to the room. Specimen was sent to lab. Pt requesting to still go home and not stay in the hosopital. Daughter here to pick him up. Pt cleaned up and placed in a new brief and blue scrubs for DC.  ?

## 2021-11-27 NOTE — ED Triage Notes (Signed)
BIB EMS from home. Pt woke up and found blood in his underwear coming from his penis. Pt also complaint of abdominal pain too. Pt able to stand and walk with minimal assistance.  Pt is a dialysis as well. MWF treatments. Hasn't missed any treatments,  ?78 HR ?100% RA ?159/82 ? ?

## 2021-11-27 NOTE — ED Notes (Signed)
Called daughter to update. Daughter advised surgeon told her Wednesday they removed both hernias during his placement of his peritoneal dialysis access.  ?

## 2021-11-27 NOTE — ED Notes (Signed)
RN to bedside to re-collect green top for lab. Pt is refusing to let me draw more blood at this time. He is crying and asking to go home. Will make MD aware.  ?

## 2021-11-27 NOTE — ED Notes (Signed)
Pink, green, lavendar, red, blue tops sent down ?

## 2021-11-27 NOTE — ED Notes (Signed)
Spoke with daughter. She will be here shortly to get the patient.  ?

## 2021-11-27 NOTE — ED Notes (Signed)
Daughter called to check on pt.

## 2021-11-27 NOTE — ED Notes (Signed)
Green tube recollected and sent to lab.  ?

## 2021-11-27 NOTE — ED Notes (Signed)
Will attempt to recollect labs and urine once pt is calm.  ?

## 2021-11-27 NOTE — Discharge Instructions (Addendum)
Your lab tests and CT scan today were all okay. You have a bladder infection, which can be treated with the Keflex antibiotic. ? Please continue to follow-up with your doctor for further evaluation of your symptoms.  Continue your dialysis sessions as scheduled. ?

## 2021-11-27 NOTE — ED Notes (Signed)
Pt flailing around in bed...Marland KitchenMarland Kitchen pt states "let me die. I just want to die". This RN called daughter to ask her was this normal behavior at home and she said no. She did say that he had been more mad and angry since his surgery.  ?

## 2021-11-29 LAB — URINE CULTURE: Culture: NO GROWTH

## 2021-12-19 DIAGNOSIS — E876 Hypokalemia: Secondary | ICD-10-CM | POA: Insufficient documentation

## 2022-01-02 ENCOUNTER — Other Ambulatory Visit: Payer: Self-pay

## 2022-01-02 ENCOUNTER — Other Ambulatory Visit: Payer: Self-pay | Admitting: Nephrology

## 2022-01-02 ENCOUNTER — Ambulatory Visit
Admission: RE | Admit: 2022-01-02 | Discharge: 2022-01-02 | Disposition: A | Discharge status: Home / Self Care | Payer: Medicare Other | Source: Ambulatory Visit | Attending: Nephrology | Admitting: Nephrology

## 2022-01-02 ENCOUNTER — Ambulatory Visit
Admission: RE | Admit: 2022-01-02 | Discharge: 2022-01-02 | Disposition: A | Discharge status: Home / Self Care | Payer: Medicare Other | Attending: Internal Medicine | Admitting: Internal Medicine

## 2022-01-02 DIAGNOSIS — T85611A Breakdown (mechanical) of intraperitoneal dialysis catheter, initial encounter: Secondary | ICD-10-CM | POA: Diagnosis not present

## 2022-01-18 ENCOUNTER — Encounter: Payer: Self-pay | Admitting: Surgery

## 2022-01-18 ENCOUNTER — Ambulatory Visit (INDEPENDENT_AMBULATORY_CARE_PROVIDER_SITE_OTHER): Payer: Medicare Other | Admitting: Surgery

## 2022-01-18 ENCOUNTER — Telehealth: Payer: Self-pay | Admitting: Surgery

## 2022-01-18 VITALS — BP 151/72 | HR 64 | Temp 97.8°F | Ht 69.0 in | Wt 180.2 lb

## 2022-01-18 DIAGNOSIS — Z992 Dependence on renal dialysis: Secondary | ICD-10-CM

## 2022-01-18 DIAGNOSIS — T85611A Breakdown (mechanical) of intraperitoneal dialysis catheter, initial encounter: Secondary | ICD-10-CM | POA: Diagnosis not present

## 2022-01-18 DIAGNOSIS — K430 Incisional hernia with obstruction, without gangrene: Secondary | ICD-10-CM

## 2022-01-18 DIAGNOSIS — N186 End stage renal disease: Secondary | ICD-10-CM

## 2022-01-18 NOTE — H&P (View-Only) (Signed)
01/18/2022  History of Present Illness: [name] [name] is a 81 y.o. male s/p robotic assisted incisional hernia repair with insertion of peritoneal dialysis catheter on 11/23/21.  Patient has been going through the classes on how to use the catheter with the home equipment.  He reports the first few times the catheter was working well, but recently the catheter stopped draining.  He's unsure if the catheter was been getting flushed after use during his classes.  He denies any abdominal pain.  His hernia repair remains intact and denies any new bulging.  Denies any redness of the exit site, any difficulty with inflow, but it's the outflow that's not working.  He still has a tunneled hemodialysis catheter that was placed on 10/03/21 and it's working well.  Past Medical History: Past Medical History:  Diagnosis Date   Anemia    Arthritis    Cancer ([place])    Chronic kidney disease    Stage 4 CKD   GERD (gastroesophageal reflux disease)    h/o   Gout    Hyperlipidemia, mixed    Hypertension    Sleep apnea    h/o no cpap     Past Surgical History: Past Surgical History:  Procedure Laterality Date   APPENDECTOMY     Bunion Removal Right    CAPD INSERTION N/A 11/23/2021   Procedure: LAPAROSCOPIC INSERTION CONTINUOUS AMBULATORY PERITONEAL DIALYSIS  (CAPD) CATHETER;  Surgeon: Olean Ree, MD;  Location: [facility] ORS;  Service: General;  Laterality: N/A;   COLONOSCOPY WITH PROPOFOL N/A 06/14/2015   Procedure: COLONOSCOPY WITH PROPOFOL;  Surgeon: Lollie Sails, MD;  [facility] END[facility]COPY;  Service: Endoscopy;  Laterality: N/A;   DIALYSIS/PERMA CATHETER INSERTION N/A 10/03/2021   Procedure: DIALYSIS/PERMA CATHETER INSERTION;  Surgeon: Algernon Huxley, MD;  Location: [place] CV LAB;  Service: Cardiovascular;  Laterality: N/A;   EYE SURGERY     HERNIA REPAIR     abd   INSERTION OF MESH  11/23/2021   Procedure: INSERTION OF MESH;  Surgeon: Olean Ree, MD;  Location: [facility] ORS;  Service:  General;;   PROSTATE BIOPSY N/A 03/27/2017   Procedure: PROSTATE BIOPSY-URO NAV;  Surgeon: Royston Cowper, MD;  Location: [facility] ORS;  Service: Urology;  Laterality: N/A;   PROSTATE BIOPSY N/A 09/11/2019   Procedure: PROSTATE BIOPSY Pearson Forster;  Surgeon: Royston Cowper, MD;  Location: [facility] ORS;  Service: Urology;  Laterality: N/A;   RETINAL DETACHMENT SURGERY Left    TRANSURETHRAL RESECTION OF PROSTATE     XI ROBOTIC ASSISTED VENTRAL HERNIA N/A 11/23/2021   Procedure: XI ROBOTIC ASSISTED VENTRAL HERNIA, incisional;  Surgeon: Olean Ree, MD;  Location: [facility] ORS;  Service: General;  Laterality: N/A;    Home Medications: Prior to Admission medications   Medication Sig Start Date End Date Taking? Authorizing Provider  acetaminophen (TYLENOL) 500 MG tablet Take 500-1,000 mg by mouth [name] 6 (six) hours as needed for moderate pain or headache.   Yes [provider]  acetaminophen (TYLENOL) 500 MG tablet Take 2 tablets (1,000 mg total) by mouth [name] 6 (six) hours as needed for mild pain. 11/23/21  Yes Meoshia Billing, Jacqulyn Bath, MD  amLODipine (NORVASC) 10 MG tablet Take 10 mg by mouth daily.   Yes [provider]  aspirin EC 81 MG tablet Take 81 mg by mouth daily.   Yes [provider]  azelastine (ASTELIN) 0.1 % nasal spray Place 1 spray into both nostrils 2 (two) times daily as needed for rhinitis. Use in each nostril  as directed   Yes [provider]  bicalutamide (CASODEX) 50 MG tablet Take 50 mg by mouth daily. 10/23/19  Yes [provider]  enalapril (VASOTEC) 20 MG tablet Take 20 mg by mouth 2 (two) times daily.    Yes [provider]  fluticasone (FLONASE) 50 MCG/ACT nasal spray Place 1 spray into both nostrils daily as needed for allergies or rhinitis.   Yes [provider]  hydrALAZINE (APRESOLINE) 100 MG tablet Take 100 mg by mouth 3 (three) times daily.   Yes [provider]  lovastatin (MEVACOR) 20 MG tablet Take 20 mg by  mouth at bedtime.   Yes [provider]  metoprolol (LOPRESSOR) 50 MG tablet Take 50 mg by mouth 2 (two) times daily.   Yes [provider]  polyethylene glycol (MIRALAX / GLYCOLAX) 17 g packet Take 17 g by mouth daily as needed for moderate constipation.   Yes [provider]  sodium bicarbonate 650 MG tablet Take 650 mg by mouth 2 (two) times daily.    Yes [provider]  ULORIC 40 MG tablet Take 40 mg by mouth daily.  03/09/17  Yes [provider]    Allergies: Allergies  Allergen Reactions   Fish Allergy Swelling   Ciprofloxacin     Unknown reaction   Doxazosin Other (See Comments)    unknown    Review of Systems: Review of Systems  Constitutional:  Negative for chills and fever.  HENT:  Negative for hearing loss.   Respiratory:  Negative for shortness of breath.   Cardiovascular:  Negative for chest pain.  Gastrointestinal:  Negative for abdominal pain, nausea and vomiting.  Genitourinary:  Negative for dysuria.  Musculoskeletal:  Negative for myalgias.  Skin:  Negative for rash.  Neurological:  Negative for dizziness.  Psychiatric/Behavioral:  Negative for depression.    Physical Exam BP (!) 151/72   Pulse 64   Temp 97.8 F (36.6 C) (Oral)   Ht '5\' 9"'$  (1.753 m)   Wt 180 lb 3.2 oz (81.7 kg)   SpO2 99%   BMI 26.61 kg/m  CONSTITUTIONAL: No acute distress, well nourished. HEENT:  Normocephalic, atraumatic, extraocular motion intact. RESPIRATORY:  Lungs are clear, and breath sounds are equal bilaterally. Normal respiratory effort without pathologic use of accessory muscles. CARDIOVASCULAR: Heart is regular without murmurs, gallops, or rubs. GI: The abdomen is soft, non-distended, non-tender to palpation.  No evidence of hernia recurrence.  Right port incisions are well healed.  Catheter exit site covered but without any evidence of surrounding infection.  The catheter was flushed with 20 ml of sterile NS without complication or  resistance.  However, when trying to draw back the fluid, only a few ML would draw back and then the patient would start experiencing discomfort in the pelvis. NEUROLOGIC:  Motor and sensation is grossly normal.  Cranial nerves are grossly intact. PSYCH:  Alert and oriented to person, place and time. Affect is normal.  Labs/Imaging: KUB 01/02/22: IMPRESSION: Peritoneal dialysis catheter located in pelvis.   Assessment and Plan: This is a 81 y.o. male s/p robotic assisted incisional hernia repair, and laparoscopic peritoneal dialysis catheter placement.  --On exam today, the catheter flushes easily but I am unable to draw much fluid more than a few ML and then the patient starts having discomfort.  Discussed with him that potentially the catheter may have a fibrin clot inside the tubing, but more likely it may be getting entangled with intra-abdominal tissue such as epiploic fat  or bowel, leading to the discomfort.  Discussed with him that the only way to free it up would be through surgery in order to visualize the issue directly and address it appropriately. --Discussed with the patient the plan for a laparoscopic revision of the peritoneal dialysis catheter.  Likely we'd be able to free it up without having to replace the catheter itself.  However, there is a risk that we may need to remove and replace the catheter.  Reviewed with him the surgery at length, including risks of bleeding, infection, injury to surrounding structures, that this would be an outpatient procedure, we would not have to do anything with the incisional hernia as that appears well healed.  He's willing to proceed. --Will schedule him for 01/31/22.  He's aware of stopping his Aspirin, with last dose on 01/25/22.  I spent 40 minutes dedicated to the care of this patient on the date of this encounter to include pre-visit review of records, face-to-face time with the patient discussing diagnosis and management, and any post-visit  coordination of care.   Melvyn Neth, [facility]

## 2022-01-18 NOTE — Progress Notes (Signed)
01/18/2022  History of Present Illness: [name] [name] is a 81 y.o. male s/p robotic assisted incisional hernia repair with insertion of peritoneal dialysis catheter on 11/23/21.  Patient has been going through the classes on how to use the catheter with the home equipment.  He reports the first few times the catheter was working well, but recently the catheter stopped draining.  He's unsure if the catheter was been getting flushed after use during his classes.  He denies any abdominal pain.  His hernia repair remains intact and denies any new bulging.  Denies any redness of the exit site, any difficulty with inflow, but it's the outflow that's not working.  He still has a tunneled hemodialysis catheter that was placed on 10/03/21 and it's working well.  Past Medical History: Past Medical History:  Diagnosis Date   Anemia    Arthritis    Cancer ([place])    Chronic kidney disease    Stage 4 CKD   GERD (gastroesophageal reflux disease)    h/o   Gout    Hyperlipidemia, mixed    Hypertension    Sleep apnea    h/o no cpap     Past Surgical History: Past Surgical History:  Procedure Laterality Date   APPENDECTOMY     Bunion Removal Right    CAPD INSERTION N/A 11/23/2021   Procedure: LAPAROSCOPIC INSERTION CONTINUOUS AMBULATORY PERITONEAL DIALYSIS  (CAPD) CATHETER;  Surgeon: Olean Ree, MD;  Location: [facility] ORS;  Service: General;  Laterality: N/A;   COLONOSCOPY WITH PROPOFOL N/A 06/14/2015   Procedure: COLONOSCOPY WITH PROPOFOL;  Surgeon: Lollie Sails, MD;  Location: [facility] ENDOSCOPY;  Service: Endoscopy;  Laterality: N/A;   DIALYSIS/PERMA CATHETER INSERTION N/A 10/03/2021   Procedure: DIALYSIS/PERMA CATHETER INSERTION;  Surgeon: Algernon Huxley, MD;  Location: [place] [facility] LAB;  Service: Cardiovascular;  Laterality: N/A;   EYE SURGERY     HERNIA REPAIR     abd   INSERTION OF MESH  11/23/2021   Procedure: INSERTION OF MESH;  Surgeon: Olean Ree, MD;  Location: [facility] ORS;  Service:  General;;   PROSTATE BIOPSY N/A 03/27/2017   Procedure: PROSTATE BIOPSY-URO NAV;  Surgeon: Royston Cowper, MD;  Location: [facility] ORS;  Service: Urology;  Laterality: N/A;   PROSTATE BIOPSY N/A 09/11/2019   Procedure: PROSTATE BIOPSY Pearson Forster;  Surgeon: Royston Cowper, MD;  Location: [facility] ORS;  Service: Urology;  Laterality: N/A;   RETINAL DETACHMENT SURGERY Left    TRANSURETHRAL RESECTION OF PROSTATE     XI ROBOTIC ASSISTED VENTRAL HERNIA N/A 11/23/2021   Procedure: XI ROBOTIC ASSISTED VENTRAL HERNIA, incisional;  Surgeon: Olean Ree, MD;  Location: [facility] ORS;  Service: General;  Laterality: N/A;    Home Medications: Prior to Admission medications   Medication Sig Start Date End Date Taking? Authorizing Provider  acetaminophen (TYLENOL) 500 MG tablet Take 500-1,000 mg by mouth every 6 (six) hours as needed for moderate pain or headache.   Yes [provider]  acetaminophen (TYLENOL) 500 MG tablet Take 2 tablets (1,000 mg total) by mouth every 6 (six) hours as needed for mild pain. 11/23/21  Yes Aseel Uhde, Jacqulyn Bath, MD  amLODipine (NORVASC) 10 MG tablet Take 10 mg by mouth daily.   Yes [provider]  aspirin EC 81 MG tablet Take 81 mg by mouth daily.   Yes [provider]  azelastine (ASTELIN) 0.1 % nasal spray Place 1 spray into both nostrils 2 (two) times daily as needed for rhinitis. Use in each nostril  as directed   Yes [provider]  bicalutamide (CASODEX) 50 MG tablet Take 50 mg by mouth daily. 10/23/19  Yes [provider]  enalapril (VASOTEC) 20 MG tablet Take 20 mg by mouth 2 (two) times daily.    Yes [provider]  fluticasone (FLONASE) 50 MCG/ACT nasal spray Place 1 spray into both nostrils daily as needed for allergies or rhinitis.   Yes [provider]  hydrALAZINE (APRESOLINE) 100 MG tablet Take 100 mg by mouth 3 (three) times daily.   Yes [provider]  lovastatin (MEVACOR) 20 MG tablet Take 20 mg by  mouth at bedtime.   Yes [provider]  metoprolol (LOPRESSOR) 50 MG tablet Take 50 mg by mouth 2 (two) times daily.   Yes [provider]  polyethylene glycol (MIRALAX / GLYCOLAX) 17 g packet Take 17 g by mouth daily as needed for moderate constipation.   Yes [provider]  sodium bicarbonate 650 MG tablet Take 650 mg by mouth 2 (two) times daily.    Yes [provider]  ULORIC 40 MG tablet Take 40 mg by mouth daily.  03/09/17  Yes [provider]    Allergies: Allergies  Allergen Reactions   Fish Allergy Swelling   Ciprofloxacin     Unknown reaction   Doxazosin Other (See Comments)    unknown    Review of Systems: Review of Systems  Constitutional:  Negative for chills and fever.  HENT:  Negative for hearing loss.   Respiratory:  Negative for shortness of breath.   Cardiovascular:  Negative for chest pain.  Gastrointestinal:  Negative for abdominal pain, nausea and vomiting.  Genitourinary:  Negative for dysuria.  Musculoskeletal:  Negative for myalgias.  Skin:  Negative for rash.  Neurological:  Negative for dizziness.  Psychiatric/Behavioral:  Negative for depression.    Physical Exam BP (!) 151/72   Pulse 64   Temp 97.8 F (36.6 C) (Oral)   Ht '5\' 9"'$  (1.753 m)   Wt 180 lb 3.2 oz (81.7 kg)   SpO2 99%   BMI 26.61 kg/m  CONSTITUTIONAL: No acute distress, well nourished. HEENT:  Normocephalic, atraumatic, extraocular motion intact. RESPIRATORY:  Lungs are clear, and breath sounds are equal bilaterally. Normal respiratory effort without pathologic use of accessory muscles. CARDIOVASCULAR: Heart is regular without murmurs, gallops, or rubs. GI: The abdomen is soft, non-distended, non-tender to palpation.  No evidence of hernia recurrence.  Right port incisions are well healed.  Catheter exit site covered but without any evidence of surrounding infection.  The catheter was flushed with 20 ml of sterile NS without complication or  resistance.  However, when trying to draw back the fluid, only a few ML would draw back and then the patient would start experiencing discomfort in the pelvis. NEUROLOGIC:  Motor and sensation is grossly normal.  Cranial nerves are grossly intact. PSYCH:  Alert and oriented to person, place and time. Affect is normal.  Labs/Imaging: KUB 01/02/22: IMPRESSION: Peritoneal dialysis catheter located in pelvis.   Assessment and Plan: This is a 81 y.o. male s/p robotic assisted incisional hernia repair, and laparoscopic peritoneal dialysis catheter placement.  --On exam today, the catheter flushes easily but I am unable to draw much fluid more than a few ML and then the patient starts having discomfort.  Discussed with him that potentially the catheter may have a fibrin clot inside the tubing, but more likely it may be getting entangled with intra-abdominal tissue such as epiploic fat  or bowel, leading to the discomfort.  Discussed with him that the only way to free it up would be through surgery in order to visualize the issue directly and address it appropriately. --Discussed with the patient the plan for a laparoscopic revision of the peritoneal dialysis catheter.  Likely we'd be able to free it up without having to replace the catheter itself.  However, there is a risk that we may need to remove and replace the catheter.  Reviewed with him the surgery at length, including risks of bleeding, infection, injury to surrounding structures, that this would be an outpatient procedure, we would not have to do anything with the incisional hernia as that appears well healed.  He's willing to proceed. --Will schedule him for 01/31/22.  He's aware of stopping his Aspirin, with last dose on 01/25/22.  I spent 40 minutes dedicated to the care of this patient on the date of this encounter to include pre-visit review of records, face-to-face time with the patient discussing diagnosis and management, and any post-visit  coordination of care.   Melvyn Neth, [facility]

## 2022-01-18 NOTE — Patient Instructions (Addendum)
We have spoken today about having your PD Catheter revised due to not draining. This will be done  at [facility] with Dr. Hampton Abbot.  You will follow up here a couple of weeks after surgery.   Please see the (blue)pre-care form that you have been given today. Our surgery scheduler will call you to verify surgery date and to go over information.   You will need to stop your Aspirin 5 days before surgery. Your last dose of Aspirin would be on June 7th for surgery on June 13th.  If you have any questions, please call our office.

## 2022-01-18 NOTE — Telephone Encounter (Signed)
Patient has been advised of Pre-Admission date/time, COVID Testing date and Surgery date.  Surgery Date: 01/31/22 Preadmission Testing Date: 01/26/22 (phone 8a-1p) Covid Testing Date: Not needed.    Patient has been made aware to call [phone], between 1-3:00pm the day before surgery, to find out what time to arrive for surgery.

## 2022-01-26 ENCOUNTER — Inpatient Hospital Stay: Admission: RE | Admit: 2022-01-26 | Payer: Medicare Other | Source: Ambulatory Visit

## 2022-01-27 ENCOUNTER — Encounter
Admission: RE | Admit: 2022-01-27 | Discharge: 2022-01-27 | Disposition: A | Discharge status: Home / Self Care | Payer: Medicare Other | Source: Ambulatory Visit | Attending: Surgery | Admitting: Surgery

## 2022-01-27 ENCOUNTER — Other Ambulatory Visit: Payer: Medicare Other

## 2022-01-27 DIAGNOSIS — N185 Chronic kidney disease, stage 5: Secondary | ICD-10-CM

## 2022-01-27 DIAGNOSIS — Z01818 Encounter for other preprocedural examination: Secondary | ICD-10-CM

## 2022-01-27 DIAGNOSIS — E1122 Type 2 diabetes mellitus with diabetic chronic kidney disease: Secondary | ICD-10-CM

## 2022-01-27 DIAGNOSIS — N186 End stage renal disease: Secondary | ICD-10-CM

## 2022-01-27 NOTE — Patient Instructions (Signed)
Your procedure is scheduled on:01-31-22 Tuesday Report to the Registration Desk on the 1st floor of the [facility]. To find out your arrival time, please call [phone] between 1PM - 3PM on:01-30-22 Monday If your arrival time is 6:00 am, do not arrive prior to that time as the [facility] entrance doors do not open until 6:00 am.  REMEMBER: Instructions that are not followed completely may result in serious medical risk, up to and including death; or upon the discretion of your surgeon and anesthesiologist your surgery may need to be rescheduled.  Do not eat food after midnight the night before surgery.  No gum chewing, lozengers or hard candies.  You may however, drink Water up to 2 hours before you are scheduled to arrive for your surgery. Do not drink anything within 2 hours of your scheduled arrival time.  TAKE THESE MEDICATIONS THE MORNING OF SURGERY WITH A SIP OF WATER: -amLODipine (NORVASC)  -bicalutamide (CASODEX) -hydrALAZINE (APRESOLINE) -metoprolol (LOPRESSOR)  -sodium bicarbonate  -ULORIC (Febuxostat)  Last dose of 81 mg Aspirin was on 01-25-22 as instructed by Dr Hampton Abbot  One week prior to surgery: Stop Anti-inflammatories (NSAIDS) such as Advil, Aleve, Ibuprofen, Motrin, Naproxen, Naprosyn and Aspirin based products such as Excedrin, Goodys Powder, BC Powder.You may however, take Tylenol if needed for pain up until the day of surgery. Stop ANY OVER THE COUNTER supplements/vitamins NOW (01-27-22) until after surgery.  No Alcohol for 24 hours before or after surgery.  No Smoking including e-cigarettes for 24 hours prior to surgery.  No chewable tobacco products for at least 6 hours prior to surgery.  No nicotine patches on the day of surgery.  Do not use any "recreational" drugs for at least a week prior to your surgery.  Please be advised that the combination of cocaine and anesthesia may have negative outcomes, up to and including death. If you test positive for  cocaine, your surgery will be cancelled.  On the morning of surgery brush your teeth with toothpaste and water, you may rinse your mouth with mouthwash if you wish. Do not swallow any toothpaste or mouthwash.  Use CHG wipes as directed on instruction sheet.  Do not wear jewelry, make-up, hairpins, clips or nail polish.  Do not wear lotions, powders, or perfumes.   Do not shave body from the neck down 48 hours prior to surgery just in case you cut yourself which could leave a site for infection.  Also, freshly shaved skin may become irritated if using the CHG soap.  Contact lenses, hearing aids and dentures may not be worn into surgery.  Do not bring valuables to the hospital. [facility] is not responsible for any missing/lost belongings or valuables.   Notify your doctor if there is any change in your medical condition (cold, fever, infection).  Wear comfortable clothing (specific to your surgery type) to the hospital.  After surgery, you can help prevent lung complications by doing breathing exercises.  Take deep breaths and cough every 1-2 hours. Your doctor may order a device called an Incentive Spirometer to help you take deep breaths. When coughing or sneezing, hold a pillow firmly against your incision with both hands. This is called "splinting." Doing this helps protect your incision. It also decreases belly discomfort.  If you are being admitted to the hospital overnight, leave your suitcase in the car. After surgery it may be brought to your room.  If you are being discharged the day of surgery, you will not be allowed  to drive home. You will need a responsible adult (18 years or older) to drive you home and stay with you that night.   If you are taking public transportation, you will need to have a responsible adult (18 years or older) with you. Please confirm with your physician that it is acceptable to use public transportation.   Please call the [facility]  Dept. at [phone] if you have any questions about these instructions.  Surgery Visitation Policy:  Patients undergoing a surgery or procedure may have two family members or support persons with them as long as the person is not COVID-19 positive or experiencing its symptoms.

## 2022-01-31 ENCOUNTER — Other Ambulatory Visit: Payer: Self-pay

## 2022-01-31 ENCOUNTER — Ambulatory Visit
Admission: RE | Admit: 2022-01-31 | Discharge: 2022-01-31 | Disposition: A | Discharge status: Home / Self Care | Payer: Medicare Other | Attending: Surgery | Admitting: Surgery

## 2022-01-31 ENCOUNTER — Encounter
Admission: RE | Disposition: A | Discharge status: Home / Self Care | Payer: Self-pay | Source: Home / Self Care | Attending: Surgery

## 2022-01-31 ENCOUNTER — Encounter: Payer: Self-pay | Admitting: Surgery

## 2022-01-31 ENCOUNTER — Ambulatory Visit: Payer: Medicare Other | Admitting: Certified Registered"

## 2022-01-31 ENCOUNTER — Ambulatory Visit: Payer: Medicare Other | Admitting: Urgent Care

## 2022-01-31 DIAGNOSIS — I120 Hypertensive chronic kidney disease with stage 5 chronic kidney disease or end stage renal disease: Secondary | ICD-10-CM | POA: Insufficient documentation

## 2022-01-31 DIAGNOSIS — M1990 Unspecified osteoarthritis, unspecified site: Secondary | ICD-10-CM | POA: Diagnosis not present

## 2022-01-31 DIAGNOSIS — Z9889 Other specified postprocedural states: Secondary | ICD-10-CM | POA: Diagnosis not present

## 2022-01-31 DIAGNOSIS — G4730 Sleep apnea, unspecified: Secondary | ICD-10-CM | POA: Insufficient documentation

## 2022-01-31 DIAGNOSIS — T85611A Breakdown (mechanical) of intraperitoneal dialysis catheter, initial encounter: Secondary | ICD-10-CM | POA: Diagnosis present

## 2022-01-31 DIAGNOSIS — N186 End stage renal disease: Secondary | ICD-10-CM

## 2022-01-31 DIAGNOSIS — K219 Gastro-esophageal reflux disease without esophagitis: Secondary | ICD-10-CM | POA: Insufficient documentation

## 2022-01-31 DIAGNOSIS — Z992 Dependence on renal dialysis: Secondary | ICD-10-CM | POA: Insufficient documentation

## 2022-01-31 DIAGNOSIS — X58XXXA Exposure to other specified factors, initial encounter: Secondary | ICD-10-CM | POA: Diagnosis not present

## 2022-01-31 DIAGNOSIS — N185 Chronic kidney disease, stage 5: Secondary | ICD-10-CM

## 2022-01-31 DIAGNOSIS — Z01818 Encounter for other preprocedural examination: Secondary | ICD-10-CM

## 2022-01-31 DIAGNOSIS — E782 Mixed hyperlipidemia: Secondary | ICD-10-CM | POA: Insufficient documentation

## 2022-01-31 HISTORY — PX: CAPD REVISION: SHX5260

## 2022-01-31 LAB — PROTIME-INR
INR: 1.1 (ref 0.8–1.2)
Prothrombin Time: 14.4 seconds (ref 11.4–15.2)

## 2022-01-31 LAB — POCT I-STAT, CHEM 8
BUN: 34 mg/dL — ABNORMAL HIGH (ref 8–23)
Calcium, Ion: 1.21 mmol/L (ref 1.15–1.40)
Chloride: 104 mmol/L (ref 98–111)
Creatinine, Ser: 8.9 mg/dL — ABNORMAL HIGH (ref 0.61–1.24)
Glucose, Bld: 107 mg/dL — ABNORMAL HIGH (ref 70–99)
HCT: 26 % — ABNORMAL LOW (ref 39.0–52.0)
Hemoglobin: 8.8 g/dL — ABNORMAL LOW (ref 13.0–17.0)
Potassium: 3.7 mmol/L (ref 3.5–5.1)
Sodium: 143 mmol/L (ref 135–145)
TCO2: 26 mmol/L (ref 22–32)

## 2022-01-31 LAB — APTT: aPTT: 31 seconds (ref 24–36)

## 2022-01-31 SURGERY — LAPAROSCOPIC REVISION CONTINUOUS AMBULATORY PERITONEAL DIALYSIS  (CAPD) CATHETER
Anesthesia: General | Site: Abdomen

## 2022-01-31 MED ORDER — GLYCOPYRROLATE 0.2 MG/ML IJ SOLN
INTRAMUSCULAR | Status: DC | PRN
  Administered 2022-01-31: .2 mg via INTRAVENOUS

## 2022-01-31 MED ORDER — DROPERIDOL 2.5 MG/ML IJ SOLN: 0.6250 mg | Freq: Once | INTRAMUSCULAR | Status: DC | PRN

## 2022-01-31 MED ORDER — ROCURONIUM BROMIDE 100 MG/10ML IV SOLN
INTRAVENOUS | Status: DC | PRN
  Administered 2022-01-31: 60 mg via INTRAVENOUS

## 2022-01-31 MED ORDER — SUGAMMADEX SODIUM 200 MG/2ML IV SOLN
INTRAVENOUS | Status: DC | PRN
  Administered 2022-01-31: 200 mg via INTRAVENOUS

## 2022-01-31 MED ORDER — PROPOFOL 10 MG/ML IV BOLUS
INTRAVENOUS | Status: DC | PRN
  Administered 2022-01-31: 150 mg via INTRAVENOUS

## 2022-01-31 MED ORDER — BUPIVACAINE LIPOSOME 1.3 % IJ SUSP: 20.0000 mL | Freq: Once | INTRAMUSCULAR | Status: DC

## 2022-01-31 MED ORDER — 0.9 % SODIUM CHLORIDE (POUR BTL) OPTIME
TOPICAL | Status: DC | PRN
  Administered 2022-01-31: 500 mL

## 2022-01-31 MED ORDER — GABAPENTIN 100 MG PO CAPS
ORAL_CAPSULE | ORAL | Status: AC
  Administered 2022-01-31: 200 mg via ORAL
  Filled 2022-01-31: qty 2

## 2022-01-31 MED ORDER — FAMOTIDINE 20 MG PO TABS
ORAL_TABLET | ORAL | Status: AC
  Administered 2022-01-31: 20 mg via ORAL
  Filled 2022-01-31: qty 1

## 2022-01-31 MED ORDER — CHLORHEXIDINE GLUCONATE 0.12 % MT SOLN: 15.0000 mL | Freq: Once | OROMUCOSAL | Status: AC

## 2022-01-31 MED ORDER — CHLORHEXIDINE GLUCONATE 0.12 % MT SOLN
OROMUCOSAL | Status: AC
  Administered 2022-01-31: 15 mL via OROMUCOSAL
  Filled 2022-01-31: qty 15

## 2022-01-31 MED ORDER — LIDOCAINE HCL (CARDIAC) PF 100 MG/5ML IV SOSY
PREFILLED_SYRINGE | INTRAVENOUS | Status: DC | PRN
  Administered 2022-01-31: 100 mg via INTRAVENOUS

## 2022-01-31 MED ORDER — CHLORHEXIDINE GLUCONATE CLOTH 2 % EX PADS
6.0000 | MEDICATED_PAD | Freq: Once | CUTANEOUS | Status: AC
  Administered 2022-01-31: 6 via TOPICAL

## 2022-01-31 MED ORDER — FENTANYL CITRATE (PF) 100 MCG/2ML IJ SOLN
INTRAMUSCULAR | Status: AC
  Filled 2022-01-31: qty 2

## 2022-01-31 MED ORDER — OXYCODONE HCL 5 MG/5ML PO SOLN: 5.0000 mg | Freq: Once | ORAL | Status: DC | PRN

## 2022-01-31 MED ORDER — VASOPRESSIN 20 UNIT/ML IV SOLN
INTRAVENOUS | Status: DC | PRN
  Administered 2022-01-31 (×2): 2 [IU] via INTRAVENOUS

## 2022-01-31 MED ORDER — FENTANYL CITRATE (PF) 100 MCG/2ML IJ SOLN: 25.0000 ug | INTRAMUSCULAR | Status: DC | PRN

## 2022-01-31 MED ORDER — ACETAMINOPHEN 500 MG PO TABS
ORAL_TABLET | ORAL | Status: AC
  Filled 2022-01-31: qty 2

## 2022-01-31 MED ORDER — OXYCODONE HCL 5 MG PO TABS: 5.0000 mg | ORAL_TABLET | Freq: Once | ORAL | Status: DC | PRN

## 2022-01-31 MED ORDER — CEFAZOLIN SODIUM-DEXTROSE 2-4 GM/100ML-% IV SOLN
INTRAVENOUS | Status: AC
  Filled 2022-01-31: qty 100

## 2022-01-31 MED ORDER — CEFAZOLIN SODIUM-DEXTROSE 2-4 GM/100ML-% IV SOLN
2.0000 g | INTRAVENOUS | Status: AC
  Administered 2022-01-31: 2 g via INTRAVENOUS

## 2022-01-31 MED ORDER — BUPIVACAINE-EPINEPHRINE (PF) 0.5% -1:200000 IJ SOLN
INTRAMUSCULAR | Status: AC
  Filled 2022-01-31: qty 30

## 2022-01-31 MED ORDER — PHENYLEPHRINE HCL-NACL 20-0.9 MG/250ML-% IV SOLN
INTRAVENOUS | Status: DC | PRN
  Administered 2022-01-31: 30 ug/min via INTRAVENOUS

## 2022-01-31 MED ORDER — ACETAMINOPHEN 500 MG PO TABS: 1000.0000 mg | ORAL_TABLET | ORAL | Status: DC

## 2022-01-31 MED ORDER — ACETAMINOPHEN 10 MG/ML IV SOLN: 1000.0000 mg | Freq: Once | INTRAVENOUS | Status: DC | PRN

## 2022-01-31 MED ORDER — HEPARIN 5000 UNITS IN NS 1000 ML (FLUSH)
INTRAMUSCULAR | Status: DC | PRN
  Administered 2022-01-31 (×2): 1001 mL via INTRAMUSCULAR

## 2022-01-31 MED ORDER — GABAPENTIN 100 MG PO CAPS: 200.0000 mg | ORAL_CAPSULE | ORAL | Status: AC

## 2022-01-31 MED ORDER — BUPIVACAINE-EPINEPHRINE 0.5% -1:200000 IJ SOLN
INTRAMUSCULAR | Status: DC | PRN
  Administered 2022-01-31: 20 mL

## 2022-01-31 MED ORDER — PHENYLEPHRINE 80 MCG/ML (10ML) SYRINGE FOR IV PUSH (FOR BLOOD PRESSURE SUPPORT)
PREFILLED_SYRINGE | INTRAVENOUS | Status: DC | PRN
  Administered 2022-01-31: 80 ug via INTRAVENOUS
  Administered 2022-01-31: 40 ug via INTRAVENOUS

## 2022-01-31 MED ORDER — PROMETHAZINE HCL 25 MG/ML IJ SOLN: 6.2500 mg | INTRAMUSCULAR | Status: DC | PRN

## 2022-01-31 MED ORDER — OXYCODONE HCL 5 MG PO TABS: 5.0000 mg | ORAL_TABLET | ORAL | 0 refills | Status: DC | PRN

## 2022-01-31 MED ORDER — ORAL CARE MOUTH RINSE: 15.0000 mL | Freq: Once | OROMUCOSAL | Status: AC

## 2022-01-31 MED ORDER — ONDANSETRON HCL 4 MG/2ML IJ SOLN
INTRAMUSCULAR | Status: DC | PRN
  Administered 2022-01-31: 4 mg via INTRAVENOUS

## 2022-01-31 MED ORDER — HEPARIN SODIUM (PORCINE) 5000 UNIT/ML IJ SOLN
INTRAMUSCULAR | Status: AC
  Filled 2022-01-31: qty 1

## 2022-01-31 MED ORDER — ACETAMINOPHEN 500 MG PO TABS: 1000.0000 mg | ORAL_TABLET | Freq: Four times a day (QID) | ORAL | Status: DC | PRN

## 2022-01-31 MED ORDER — FENTANYL CITRATE (PF) 100 MCG/2ML IJ SOLN
INTRAMUSCULAR | Status: DC | PRN
Start: 2022-01-31 — End: 2022-01-31
  Administered 2022-01-31: 50 ug via INTRAVENOUS

## 2022-01-31 MED ORDER — SODIUM CHLORIDE 0.9 % IV SOLN: INTRAVENOUS | Status: DC

## 2022-01-31 MED ORDER — EPHEDRINE SULFATE (PRESSORS) 50 MG/ML IJ SOLN
INTRAMUSCULAR | Status: DC | PRN
  Administered 2022-01-31: 5 mg via INTRAVENOUS

## 2022-01-31 MED ORDER — FAMOTIDINE 20 MG PO TABS: 20.0000 mg | ORAL_TABLET | Freq: Once | ORAL | Status: AC

## 2022-01-31 SURGICAL SUPPLY — 46 items
ADAPTER CATH DIALYSIS 4X8 IT L (MISCELLANEOUS) ×1 IMPLANT
ADH SKN CLS APL DERMABOND .7 (GAUZE/BANDAGES/DRESSINGS) ×1
BLADE SURG 11 STRL SS SAFETY (MISCELLANEOUS) ×2 IMPLANT
CATH EXTENDED DIALYSIS (CATHETERS) ×2 IMPLANT
DERMABOND ADVANCED (GAUZE/BANDAGES/DRESSINGS) ×1
DERMABOND ADVANCED .7 DNX12 (GAUZE/BANDAGES/DRESSINGS) ×1 IMPLANT
ELECT CAUTERY BLADE 6.4 (BLADE) ×2 IMPLANT
ELECT REM PT RETURN 9FT ADLT (ELECTROSURGICAL) ×2
ELECTRODE REM PT RTRN 9FT ADLT (ELECTROSURGICAL) ×1 IMPLANT
GLOVE SURG SYN 7.0 (GLOVE) ×2 IMPLANT
GLOVE SURG SYN 7.0 PF PI (GLOVE) ×1 IMPLANT
GLOVE SURG SYN 7.5  E (GLOVE) ×1
GLOVE SURG SYN 7.5 E (GLOVE) ×1 IMPLANT
GLOVE SURG SYN 7.5 PF PI (GLOVE) ×1 IMPLANT
GOWN STRL REUS W/ TWL LRG LVL3 (GOWN DISPOSABLE) ×2 IMPLANT
GOWN STRL REUS W/TWL LRG LVL3 (GOWN DISPOSABLE) ×4
GRASPER SUT TROCAR 14GX15 (MISCELLANEOUS) ×1 IMPLANT
IV NS 1000ML (IV SOLUTION) ×2
IV NS 1000ML BAXH (IV SOLUTION) ×1 IMPLANT
KIT TURNOVER KIT A (KITS) ×2 IMPLANT
MANIFOLD NEPTUNE II (INSTRUMENTS) ×2 IMPLANT
MINICAP W/POVIDONE IODINE SOL (MISCELLANEOUS) ×2 IMPLANT
NDL INSUFFLATION 14GA 120MM (NEEDLE) ×1 IMPLANT
NEEDLE HYPO 22GX1.5 SAFETY (NEEDLE) ×2 IMPLANT
NEEDLE INSUFFLATION 14GA 120MM (NEEDLE) ×2 IMPLANT
NS IRRIG 500ML POUR BTL (IV SOLUTION) ×2 IMPLANT
PACK LAP CHOLECYSTECTOMY (MISCELLANEOUS) ×2 IMPLANT
PENCIL ELECTRO HAND CTR (MISCELLANEOUS) ×2 IMPLANT
SET CYSTO W/LG BORE CLAMP LF (SET/KITS/TRAYS/PACK) ×2 IMPLANT
SET TRANSFER 6 W/TWIST CLAMP 5 (SET/KITS/TRAYS/PACK) ×1 IMPLANT
SET TUBE SMOKE EVAC HIGH FLOW (TUBING) ×2 IMPLANT
SLEEVE ADV FIXATION 5X100MM (TROCAR) ×4 IMPLANT
SPONGE DRAIN TRACH 4X4 STRL 2S (GAUZE/BANDAGES/DRESSINGS) ×2 IMPLANT
STYLET FALLER (MISCELLANEOUS) IMPLANT
STYLET FALLER MEDIONICS (MISCELLANEOUS) IMPLANT
SUT ETHILON 2 0 FS 18 (SUTURE) ×2 IMPLANT
SUT MNCRL 4-0 (SUTURE) ×4
SUT MNCRL 4-0 27XMFL (SUTURE) ×2
SUT PROLENE 2 0 SH DA (SUTURE) ×1 IMPLANT
SUT VIC AB 3-0 SH 27 (SUTURE) ×2
SUT VIC AB 3-0 SH 27X BRD (SUTURE) ×1 IMPLANT
SUT VICRYL 0 AB UR-6 (SUTURE) ×2 IMPLANT
SUTURE MNCRL 4-0 27XMF (SUTURE) ×1 IMPLANT
SYS KII FIOS ACCESS ABD 5X100 (TROCAR) ×2
SYSTEM KII FIOS ACES ABD 5X100 (TROCAR) ×1 IMPLANT
TROCAR KII BLADELESS 5X150 (TROCAR) ×3 IMPLANT

## 2022-01-31 NOTE — Brief Op Note (Signed)
01/31/2022  12:27 PM  PATIENT:  [name] [name]  81 y.o. male  PRE-OPERATIVE DIAGNOSIS:  Malfunctioning PD catheter  POST-OPERATIVE DIAGNOSIS:  Malfunctioning PD catheter  PROCEDURE:  Procedure(s): LAPAROSCOPIC REVISION CONTINUOUS AMBULATORY PERITONEAL DIALYSIS  (CAPD) CATHETER (N/A)  SURGEON:  Surgeon(s) and Role:    * Olean Ree, MD - Primary  ASSISTANTS: Darnelle Bos, PA-S   ANESTHESIA:   general  EBL:  5 ml   BLOOD ADMINISTERED:none  DRAINS:  Prior peritoneal dialysis catheter    LOCAL MEDICATIONS USED:  BUPIVICAINE   SPECIMEN:  No Specimen  DISPOSITION OF SPECIMEN:  N/A  COUNTS:  YES  DICTATION: .Dragon Dictation  PLAN OF CARE: Discharge to home after PACU  PATIENT DISPOSITION:  PACU - hemodynamically stable.   Delay start of Pharmacological VTE agent (>24hrs) due to surgical blood loss or risk of bleeding: yes

## 2022-01-31 NOTE — Op Note (Signed)
Procedure Date:  01/31/2022  Pre-operative Diagnosis:  Malfunctioning peritoneal dialysis catheter  Post-operative Diagnosis:  Malfunctioning peritoneal dialysis catheter  Procedure:  Laparoscopic revision of peritoneal dialysis catheter  Surgeon:  Melvyn Neth, MD  Assistant:  Darnelle Bos, PA-S  Anesthesia:  General endotracheal  Estimated Blood Loss:  10 ml  Specimens:  None  Complications:  None  Findings:  The peritoneal dialysis catheter had no kinks and no clots within the catheter.  Initial flushing and aspiration worked well with his abdomen insufflated, but with desufflation, his colon appeared to push on the catheter preventing appropriate drainage.  The catheter was tacked laterally on the left to prevent this.  Indications for Procedure:  This is a 81 y.o. male s/p recent robotic assisted ventral hernia repair with laparoscopic insertion of peritoneal dialysis catheter on 11/23/21.  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  He requires revision of the catheter.  The risks of bleeding, abscess or infection, potential for an open procedure, catheter failure, and injury to surrounding structures were all discussed with the patient and he was willing to proceed.  Description of Procedure: The patient was correctly identified in the preoperative area and brought into the operating room.  The patient was placed supine with VTE prophylaxis in place.  Appropriate time-outs were performed.  Anesthesia was induced and the patient was intubated.  Appropriate antibiotics were infused.  Foley catheter was inserted.  The abdomen was prepped and draped in a sterile fashion incorporating the catheter in the field.  An incision in the right upper quadrant was made and a Veress needle was introduced and pneumoperitoneum was established with appropriate opening pressures.  An additional incision was made in the  right lateral abdomen and Optiview technique was used to introduce a 5 mm laparoscopic port under visualization.  Then, an additional 5 mm port was placed at the location of the Veress needle and in the right lower quadrant.  There was no bowel injury doing this.  The abdomen was inspected and the peritoneal dialysis catheter was in good position in the pelvis and without any kinks or clots within the tubing.  At that point, we started flushing of the catheter with heparinized saline and there was good flow in and through the catheter into the pelvis.  Manual aspiration using a 20 ml syringe showed no resistance.  We tried the same process after decreasing the pneumoperitoneum pressure to 5 mmHg.  However, after trying to aspirate, we encountered resistance.  With pneumoperitoneum fully released, the same process was done and again resistance was met.  After obtaining full pneumoperitoneum again, it was noted that his colon was pushing on the catheter as it went to the pelvis.  It was decided to tack the catheter partially towards the left abdominal wall to get the colon off the catheter as it went towards the pelvis.  This was done via a small left lower quadrant incision using PMI and a 2-0 Prolene suture.  After this was completed, the catheter was again irrigated using heparinized saline and there was great flow in and out with pneumoperitoneum released.  At this point, with successful revision of the catheter, a new distal titanium connector and tubing were placed and new betadine cap was inserted.  Local anesthetic was infiltrated onto all incisions.  The incisions were closed using 4-0 Monocryl.  The wounds were cleaned and sealed with DermaBond.  A Biopatch was placed at the catheter  exit site and dressed with 4x4 gauze and porous tape.  Foley catheter was removed.  The patient was emerged from anesthesia and extubated and brought to the recovery room for further management.  The patient tolerated the  procedure well and all counts were correct at the end of the case.   Melvyn Neth, MD

## 2022-01-31 NOTE — Interval H&P Note (Signed)
History and Physical Interval Note:  01/31/2022 10:24 AM  [name] [name]  has presented today for surgery, with the diagnosis of Malfunctioning PD catheter.  The various methods of treatment have been discussed with the patient and family. After consideration of risks, benefits and other options for treatment, the patient has consented to  Procedure(s): [place]  (CAPD) CATHETER (N/A) as a surgical intervention.  The patient's history has been reviewed, patient examined, no change in status, stable for surgery.  I have reviewed the patient's chart and labs.  Questions were answered to the patient's satisfaction.     Danijah Noh

## 2022-01-31 NOTE — Transfer of Care (Signed)
Immediate Anesthesia Transfer of Care Note  Patient: [name] [name]  Procedure(s) Performed: LAPAROSCOPIC REVISION CONTINUOUS AMBULATORY PERITONEAL DIALYSIS  (CAPD) CATHETER (Abdomen)  Patient Location: PACU  Anesthesia Type:General  Level of Consciousness: awake, drowsy and patient cooperative  Airway & Oxygen Therapy: Patient Spontanous Breathing and Patient connected to face mask oxygen  Post-op Assessment: Report given to RN and Post -op Vital signs reviewed and stable  Post vital signs: Reviewed and stable  Last Vitals:  Vitals Value Taken Time  BP 135/63 01/31/22 1231  Temp 36.8 C 01/31/22 1231  Pulse 65 01/31/22 1238  Resp 18 01/31/22 1238  SpO2 100 % 01/31/22 1238  Vitals shown include unvalidated device data.  Last Pain:  Vitals:   01/31/22 1231  TempSrc:   PainSc: 0-No pain      Patients Stated Pain Goal: 0 (02/54/27 0623)  Complications: No notable events documented.

## 2022-01-31 NOTE — Discharge Instructions (Signed)
AMBULATORY SURGERY  ?DISCHARGE INSTRUCTIONS ? ? ?The drugs that you were given will stay in your system until tomorrow so for the next 24 hours you should not: ? ?Drive an automobile ?Make any legal decisions ?Drink any alcoholic beverage ? ? ?You may resume regular meals tomorrow.  Today it is better to start with liquids and gradually work up to solid foods. ? ?You may eat anything you prefer, but it is better to start with liquids, then soup and crackers, and gradually work up to solid foods. ? ? ?Please notify your doctor immediately if you have any unusual bleeding, trouble breathing, redness and pain at the surgery site, drainage, fever, or pain not relieved by medication. ? ? ? ?Additional Instructions: ? ? ? ?Please contact your physician with any problems or Same Day Surgery at [phone], Monday through Friday 6 am to 4 pm, or [place] [facility] Main number at [phone].  ?

## 2022-01-31 NOTE — Anesthesia Preprocedure Evaluation (Addendum)
Anesthesia Evaluation  Patient identified by MRN, date of birth, ID band Patient awake    Reviewed: Allergy & Precautions, NPO status , Patient's Chart, lab work & pertinent test results, reviewed documented beta blocker date and time   History of Anesthesia Complications Negative for: history of anesthetic complications  Airway Mallampati: III  TM Distance: <3 FB Neck ROM: full    Dental no notable dental hx.    Pulmonary neg shortness of breath, sleep apnea (Pt states has improved) ,    Pulmonary exam normal        Cardiovascular Exercise Tolerance: Good hypertension, Pt. on medications and Pt. on home beta blockers (-) angina(-) Past MI Normal cardiovascular exam     Neuro/Psych negative neurological ROS  negative psych ROS   GI/Hepatic Neg liver ROS, GERD  Controlled,  Endo/Other  negative endocrine ROS  Renal/GU ESRF and DialysisRenal disease     Musculoskeletal  (+) Arthritis ,   Abdominal   Peds  Hematology negative hematology ROS (+)   Anesthesia Other Findings Malfunctioning PD catheter   Past Medical History: No date: Anemia No date: Arthritis No date: Cancer ([place]) No date: Chronic kidney disease     Comment:  Stage 4 CKD No date: GERD (gastroesophageal reflux disease)     Comment:  h/o No date: Gout No date: Hyperlipidemia, mixed No date: Hypertension No date: Sleep apnea     Comment:  h/o no cpap  Past Surgical History: No date: APPENDECTOMY No date: Bunion Removal; Right 06/14/2015: COLONOSCOPY WITH PROPOFOL; N/A     Comment:  Procedure: COLONOSCOPY WITH PROPOFOL;  Surgeon: Lollie Sails, MD;  Location: [facility] ENDOSCOPY;  Service:               Endoscopy;  Laterality: N/A; 10/03/2021: DIALYSIS/PERMA CATHETER INSERTION; N/A     Comment:  Procedure: DIALYSIS/PERMA CATHETER INSERTION;  Surgeon:               Algernon Huxley, MD;  Location: [facility];                 Service: Cardiovascular;  Laterality: N/A; No date: EYE SURGERY No date: HERNIA REPAIR     Comment:  abd 03/27/2017: PROSTATE BIOPSY; N/A     Comment:  Procedure: PROSTATE BIOPSY-URO NAV;  Surgeon: Royston Cowper, MD;  Location: [facility] ORS;  Service: Urology;                Laterality: N/A; 09/11/2019: PROSTATE BIOPSY; N/A     Comment:  Procedure: PROSTATE BIOPSY Pearson Forster;  Surgeon:               Royston Cowper, MD;  Location: [facility] ORS;  Service:               Urology;  Laterality: N/A; No date: RETINAL DETACHMENT SURGERY; Left No date: TRANSURETHRAL RESECTION OF PROSTATE  BMI    Body Mass Index: 27.48 kg/m      Reproductive/Obstetrics negative OB ROS                            Anesthesia Physical  Anesthesia Plan  ASA: 3  Anesthesia Plan: General ETT   Post-op Pain Management: Tylenol PO (pre-op)*   Induction: Intravenous  PONV Risk Score and Plan:  Ondansetron, Dexamethasone and Treatment may vary due to age or medical condition  Airway Management Planned: Oral ETT  Additional Equipment:   Intra-op Plan:   Post-operative Plan: Extubation in OR  Informed Consent: I have reviewed the patients History and Physical, chart, labs and discussed the procedure including the risks, benefits and alternatives for the proposed anesthesia with the patient or authorized representative who has indicated his/her understanding and acceptance.     Dental Advisory Given  Plan Discussed with: Anesthesiologist, CRNA and Surgeon  Anesthesia Plan Comments: (Patient consented for risks of anesthesia including but not limited to:  - adverse reactions to medications - damage to eyes, teeth, lips or other oral mucosa - nerve damage due to positioning  - sore throat or hoarseness - Damage to heart, brain, nerves, lungs, other parts of body or loss of life  Patient voiced understanding.)       Anesthesia Quick Evaluation

## 2022-01-31 NOTE — Anesthesia Postprocedure Evaluation (Signed)
Anesthesia Post Note  Patient: [name] [name]  Procedure(s) Performed: LAPAROSCOPIC REVISION CONTINUOUS AMBULATORY PERITONEAL DIALYSIS  (CAPD) CATHETER (Abdomen)  Patient location during evaluation: PACU Anesthesia Type: General Level of consciousness: awake and alert Pain management: pain level controlled Vital Signs Assessment: post-procedure vital signs reviewed and stable Respiratory status: spontaneous breathing, nonlabored ventilation and respiratory function stable Cardiovascular status: blood pressure returned to baseline and stable Postop Assessment: no apparent nausea or vomiting Anesthetic complications: no   No notable events documented.   Last Vitals:  Vitals:   01/31/22 1231 01/31/22 1245  BP: 135/63 (!) 108/55  Pulse: 64 64  Resp: 14 12  Temp: 36.8 C   SpO2: 100% 99%    Last Pain:  Vitals:   01/31/22 1245  TempSrc:   PainSc: 0-No pain                 [place] Ouch

## 2022-01-31 NOTE — Anesthesia Procedure Notes (Signed)
Procedure Name: Intubation Date/Time: 01/31/2022 10:43 AM  Performed by: Tollie Eth, CRNAPre-anesthesia Checklist: Patient identified, Patient being monitored, Timeout performed, Emergency Drugs available and Suction available Patient Re-evaluated:Patient Re-evaluated prior to induction Oxygen Delivery Method: Circle system utilized Preoxygenation: Pre-oxygenation with 100% oxygen Induction Type: IV induction Ventilation: Mask ventilation without difficulty Laryngoscope Size: Mac and 4 Grade View: Grade I Tube type: Oral Tube size: 7.0 mm Number of attempts: 1 Airway Equipment and Method: Stylet Placement Confirmation: ETT inserted through vocal cords under direct vision, positive ETCO2 and breath sounds checked- equal and bilateral Secured at: 21 cm Tube secured with: Tape Dental Injury: Teeth and Oropharynx as per pre-operative assessment

## 2022-02-14 ENCOUNTER — Encounter: Payer: Medicare Other | Admitting: Physician Assistant

## 2022-03-01 ENCOUNTER — Telehealth (INDEPENDENT_AMBULATORY_CARE_PROVIDER_SITE_OTHER): Payer: Self-pay

## 2022-03-01 NOTE — Telephone Encounter (Signed)
I called and spoke with someone in the home that let me know the patient was not home and he will call me back tomorrow. I am attempting to get the patient scheduled for a permcath removal.

## 2022-03-02 ENCOUNTER — Other Ambulatory Visit: Payer: Self-pay | Admitting: Nephrology

## 2022-03-02 DIAGNOSIS — T85611A Breakdown (mechanical) of intraperitoneal dialysis catheter, initial encounter: Secondary | ICD-10-CM

## 2022-03-03 ENCOUNTER — Ambulatory Visit
Admission: RE | Admit: 2022-03-03 | Discharge: 2022-03-03 | Disposition: A | Discharge status: Home / Self Care | Payer: Medicare Other | Source: Ambulatory Visit | Attending: Nephrology | Admitting: Nephrology

## 2022-03-03 ENCOUNTER — Ambulatory Visit
Admission: RE | Admit: 2022-03-03 | Discharge: 2022-03-03 | Disposition: A | Discharge status: Home / Self Care | Payer: Medicare Other | Attending: Nephrology | Admitting: Nephrology

## 2022-03-03 DIAGNOSIS — T85611A Breakdown (mechanical) of intraperitoneal dialysis catheter, initial encounter: Secondary | ICD-10-CM

## 2022-03-06 ENCOUNTER — Ambulatory Visit (INDEPENDENT_AMBULATORY_CARE_PROVIDER_SITE_OTHER): Payer: Medicare Other | Admitting: Surgery

## 2022-03-06 ENCOUNTER — Encounter: Payer: Self-pay | Admitting: Surgery

## 2022-03-06 VITALS — BP 120/58 | HR 73 | Temp 98.3°F | Wt 183.4 lb

## 2022-03-06 DIAGNOSIS — T85611D Breakdown (mechanical) of intraperitoneal dialysis catheter, subsequent encounter: Secondary | ICD-10-CM | POA: Diagnosis not present

## 2022-03-06 DIAGNOSIS — N186 End stage renal disease: Secondary | ICD-10-CM

## 2022-03-06 DIAGNOSIS — Z992 Dependence on renal dialysis: Secondary | ICD-10-CM

## 2022-03-06 DIAGNOSIS — T85611A Breakdown (mechanical) of intraperitoneal dialysis catheter, initial encounter: Secondary | ICD-10-CM

## 2022-03-06 NOTE — Patient Instructions (Signed)
You want to try Miralax OTC everyday to help flush things down so you can have regular bowel movements. If you develop diarrhea, you may back off on the Miralax to every other day.    If you have any concerns or questions, please feel free to call our office.   Peritoneal Dialysis Information Peritoneal dialysis is a procedure that filters your blood. You may have this procedure if your kidneys are not working well. You can perform peritoneal dialysis yourself, or a machine can do it for you at night when you sleep.  Tell a health care provider about: Any allergies you have. All medicines you are taking, including vitamins, herbs, eye drops, creams, and over-the-counter medicines. Any problems you or family members have had with anesthetic medicines. Any blood disorders you have. Any surgeries you have. Any medical conditions you have. Whether you are pregnant or may be pregnant. What are the risks? Generally, this is a safe procedure. However, problems may occur, including: Infection in the lining of your abdomen (peritoneum). This is the most common problem. Infection in the area where the catheter was inserted. Discomfort in the area where the catheter was inserted. Weakened abdominal muscles. This may lead to a hernia, which can cause problems if left untreated. What happens before the procedure? It is important to safely prepare for treatment and take steps to prevent infection. Your health care provider will teach you how to prepare for a dialysis session. Preparation may involve: Putting on a mask. Closing doors and windows in the room where dialysis will be performed. Making sure to wash your hands before and during treatment. Anyone who touches you or the equipment should also wash his or her hands often. Making sure that tubing and equipment are germ-free (sterile). Checking the bag of fluid (dialysate) you will use during the session, to make sure it is sealed and free of germs  (uncontaminated). What happens during the procedure?  At the start of a session, your abdomen is filled with dialysate. The dialysate pulls waste, salt, and extra water through the peritoneum. At the end of the session, the dialysate, plus all the waste it pulled from your blood, is drained from your body. There are two kinds of peritoneal dialysis. You may be treated using: Continuous cycling peritoneal dialysis (CCPD). In this type, a machine called a cycler performs an exchange for you by filling and draining your abdomen while you sleep. Continuous ambulatory peritoneal dialysis (CAPD). In this type, you perform exchanges for yourself up to 5 times a day. Each exchange takes about 30-40 minutes. The amount of time the dialysate stays in your body (the dwell) usually varies from 1.5-3 hours. You may go about your day normally between exchanges. Some people may need to have both CAPD and CCPD. What can I expect after procedure? You may need to have lab work or other tests done to check on how well the dialysis is working. Change the bandage (dressing) around your permanent catheter as directed by your health care provider. Keep the dressing clean and dry. Weigh yourself after the treatment and write down your weight. Follow these instructions at home: Eating and drinking Follow your health care provider's instructions about diet. You should follow a diet plan that includes: Nutritional counseling with a dietitian. Vitamin supplements. High-quality proteins, such as meat, poultry, fish, and eggs. Most people on peritoneal dialysis need to eat a high-protein diet because protein is lost during the dialysis exchange. Preventing constipation Avoid becoming constipated. Constipation  prevents dialysate from draining well. To prevent constipation: Eat foods that are high in fiber, such as beans, whole grains, and fresh fruits and vegetables. Limit foods that are high in fat and processed sugars, such  as fried or sweet foods. Be active. Go to the restroom when you feel that you need to. Do not hold it in. Take over-the-counter or prescription medicines, such as laxatives, only if your health care provider recommends them. General instructions Keep a strict schedule. Dialysis must be done every day. Do not skip a day or an exchange. Make time for each exchange. Always keep the dialysate bags and other supplies in a cool, clean, and dry place. Take over-the-counter and prescription medicines only as told by your health care provider. Weigh yourself every day. Sudden weight gain may be a sign of a problem. Keep all follow-up visits. This is important. Where to find more information You can find more information about peritoneal dialysis treatment from: [facility] of Diabetes and Digestive and Kidney Diseases: [employer] National Kidney Foundation: www.kidney.org Contact a health care provider if: You have a fever or chills. You feel nauseous or you vomit. You have diarrhea. You have any problems with an exchange. Your blood pressure increases. You suddenly gain weight or feel short of breath. The catheter seems loose or feels like it is coming out. The fluid that has drained from your abdomen is pinkish or reddish. Women having their menstrual period do not need to seek medical care if the fluid is only a little pink or a little red. There are white strands in the dialysate that are large enough to get stuck in your tubing or catheter. Get help right away if: The area around the catheter swells or becomes red, tender, or painful. There is pus coming from the catheter area. The fluid that has drained from your abdomen is cloudy. You feel more abdominal pain or discomfort. Summary Peritoneal dialysis is a procedure that filters your blood. You may have this procedure if your kidneys are not working well. CAPD and CCPD are the two kinds of peritoneal dialysis. Your health  care provider will help you decide which kind is best for you. The main risks of peritoneal dialysis are infection and weakened abdominal muscles, which may lead to a hernia. This information is not intended to replace advice given to you by your health care provider. Make sure you discuss any questions you have with your health care provider. Document Revised: 03/25/2020 Document Reviewed: 03/25/2020 Elsevier Patient Education  [place].

## 2022-03-06 NOTE — Progress Notes (Signed)
03/06/2022  HPI: [name] [name] is a 81 y.o. male s/p laparoscopic PD catheter placement on 11/23/21, followed by laparoscopic revision of PD catheter on 01/31/22 due to malfunction, presents again today due to again malfunction of the catheter.  On his revision on 01/31/22, the catheter itself did not have any clots or kinks, but it appeared that his colon was pushing against it.  It was tacked laterally to the left to see if this would help.  The patient reports that it worked initially, and now after about 2-3 hours, the machine at home starts buzzing and the catheter does not drain.    He reports that he takes stool softeners, and he has a bowel movement about every other day.  Initially he was starting to say about twice a week, but then corrected himself to every other day.    The patient appears depressed.  The main reason that he opted for dialysis at home was so he could be with his wife, as she has dementia and he is her primary caregiver.  He continues doing dialysis through an IJ permacath.  Vital signs: BP (!) 120/58   Pulse 73   Temp 98.3 F (36.8 C) (Oral)   Wt 183 lb 6.4 oz (83.2 kg)   SpO2 97%   BMI 27.08 kg/m    Physical Exam: Constitutional: No acute distress. Abdomen:  The patient was standing up talking the whole time and I was unable to examine his catheter or incisions.  The more we talked, the more he kept walking from one corner of the exam room to the door to exit.  Assessment/Plan: This is a 81 y.o. male s/p laparoscopic PD catheter placement, with one revision last month.  --Discussed with the patient that on his last revision, it was felt that his colon was pushing against the catheter, causing the malfunction.  Discussed how constipation can cause problems with the catheter, and he may indeed be constipated.  Discussed with him the need for a daily bowel movement and instructed him to start taking MiraLax once daily to keep his bowels more regular.  If his colon is  emptier, it may not push as much against the catheter. --Another option would be to proceed to surgery to revise, though likely excise the current PD catheter, and place a new one coming from the right side of the abdomen instead of the left side.  However, this would lead to another waiting period as the new incisions need to heal prior to using the catheter for higher volumes.   --He is worried again about not being able to be with his wife.  He reports that he has no help with her.  I advised that he contact her PCP to see if there are any services that social work could arrange for her such as nurse aids, home visits, etc.  This may help ease his burden so he can also take care of himself. --The patient is uncertain what to do.  He also asked about an AV fistula, but I also discussed with him that although that is another option, there is longer waiting time after the fistula is created in order to allow the fistula to mature prior to being able to use it for at-home HD. --The patient will think about his options and will get back to [place] if he is interested in revision/new PD catheter.  I discussed with him that we will contact him this Friday 7/21 to check on his constipation progress  and check on his thoughts about revision.   Melvyn Neth, [facility]

## 2022-03-15 ENCOUNTER — Ambulatory Visit: Admission: RE | Admit: 2022-03-15 | Payer: Medicare Other | Source: Ambulatory Visit

## 2022-03-15 ENCOUNTER — Telehealth (INDEPENDENT_AMBULATORY_CARE_PROVIDER_SITE_OTHER): Payer: Self-pay

## 2022-03-15 NOTE — Telephone Encounter (Signed)
I attempted to contact the patient to schedule a permcath removal and a message was left on the home voice mail. I did attempt the mobile and the voicemail box was full.

## 2022-03-16 ENCOUNTER — Ambulatory Visit
Admission: RE | Admit: 2022-03-16 | Discharge: 2022-03-16 | Disposition: A | Discharge status: Home / Self Care | Payer: Medicare Other | Source: Ambulatory Visit | Attending: Nephrology | Admitting: Nephrology

## 2022-03-16 DIAGNOSIS — D631 Anemia in chronic kidney disease: Secondary | ICD-10-CM | POA: Diagnosis not present

## 2022-03-16 DIAGNOSIS — D696 Thrombocytopenia, unspecified: Secondary | ICD-10-CM | POA: Diagnosis not present

## 2022-03-16 DIAGNOSIS — N184 Chronic kidney disease, stage 4 (severe): Secondary | ICD-10-CM | POA: Insufficient documentation

## 2022-03-16 LAB — PREPARE RBC (CROSSMATCH)

## 2022-03-16 LAB — ABO/RH: ABO/RH(D): O POS

## 2022-03-17 LAB — TYPE AND SCREEN
ABO/RH(D): O POS
Antibody Screen: NEGATIVE
Unit division: 0

## 2022-03-17 LAB — BPAM RBC
Blood Product Expiration Date: 202308312359
ISSUE DATE / TIME: 202307271059
Unit Type and Rh: 5100

## 2022-03-24 ENCOUNTER — Telehealth: Payer: Self-pay | Admitting: Surgery

## 2022-03-24 NOTE — Telephone Encounter (Signed)
Patient has been advised of Pre-Admission date/time, and Surgery date.  Surgery Date: 04/05/22 @ [facility] Preadmission Testing Date: 04/03/22 (phone 8a-1p)  Patient has been made aware to call [phone], between 1-3:00pm the day before surgery, to find out what time to arrive for surgery.    Also patient informed to stop his Aspirin 5 days prior to his surgery.  Patient verbalized understanding.

## 2022-03-27 ENCOUNTER — Ambulatory Visit: Payer: Medicare Other | Admitting: Surgery

## 2022-03-29 ENCOUNTER — Other Ambulatory Visit: Payer: Self-pay

## 2022-03-29 ENCOUNTER — Ambulatory Visit: Payer: Medicare Other | Admitting: Surgery

## 2022-03-29 ENCOUNTER — Encounter: Payer: Self-pay | Admitting: Surgery

## 2022-03-29 VITALS — BP 131/61 | HR 60 | Temp 98.1°F | Ht 68.0 in | Wt 179.2 lb

## 2022-03-29 DIAGNOSIS — N186 End stage renal disease: Secondary | ICD-10-CM | POA: Diagnosis not present

## 2022-03-29 DIAGNOSIS — Z992 Dependence on renal dialysis: Secondary | ICD-10-CM

## 2022-03-29 DIAGNOSIS — T85611D Breakdown (mechanical) of intraperitoneal dialysis catheter, subsequent encounter: Secondary | ICD-10-CM

## 2022-03-29 DIAGNOSIS — T85611A Breakdown (mechanical) of intraperitoneal dialysis catheter, initial encounter: Secondary | ICD-10-CM

## 2022-03-29 NOTE — Patient Instructions (Signed)
Please call the office with any questions or concerns.

## 2022-03-29 NOTE — Progress Notes (Unsigned)
03/29/2022  History of Present Illness: [name] [name] is a 81 y.o. male presenting for H&P update and to discuss surgery for his peritoneal dialysis catheter.  The patient has a history of a laparoscopic peritoneal dialysis catheter placement on 11/23/2021 with laparoscopic revision of the same catheter on 01/31/2022 due to inability to drain the fluid during dialysis.  This worked for a few times and more recently is also giving him problems again when the machine stops in the latter stages and it is unable to drain the peritoneal fluid appropriately.  I saw him on 03/06/22 to talk about revision or replacement of the catheter, but he did not know what to do.  He presents now to discuss further.  Past Medical History: Past Medical History:  Diagnosis Date   Anemia    Arthritis    Cancer ([place])    Chronic kidney disease    Stage 4 CKD   GERD (gastroesophageal reflux disease)    h/o   Gout    Hyperlipidemia, mixed    Hypertension    Sleep apnea    h/o no cpap     Past Surgical History: Past Surgical History:  Procedure Laterality Date   APPENDECTOMY     Bunion Removal Right    CAPD INSERTION N/A 11/23/2021   Procedure: LAPAROSCOPIC INSERTION CONTINUOUS AMBULATORY PERITONEAL DIALYSIS  (CAPD) CATHETER;  Surgeon: Olean Ree, MD;  Location: [facility] ORS;  Service: General;  Laterality: N/A;   CAPD REVISION N/A 01/31/2022   Procedure: LAPAROSCOPIC REVISION CONTINUOUS AMBULATORY PERITONEAL DIALYSIS  (CAPD) CATHETER;  Surgeon: Olean Ree, MD;  Location: [facility] ORS;  Service: General;  Laterality: N/A;   COLONOSCOPY WITH PROPOFOL N/A 06/14/2015   Procedure: COLONOSCOPY WITH PROPOFOL;  Surgeon: Lollie Sails, MD;  Location: [facility] ENDOSCOPY;  Service: Endoscopy;  Laterality: N/A;   DIALYSIS/PERMA CATHETER INSERTION N/A 10/03/2021   Procedure: DIALYSIS/PERMA CATHETER INSERTION;  Surgeon: Algernon Huxley, MD;  Location: [place] CV LAB;  Service: Cardiovascular;  Laterality: N/A;   EYE SURGERY      HERNIA REPAIR     abd   INSERTION OF MESH  11/23/2021   Procedure: INSERTION OF MESH;  Surgeon: Olean Ree, MD;  Location: [facility] ORS;  Service: General;;   PROSTATE BIOPSY N/A 03/27/2017   Procedure: PROSTATE BIOPSY-URO NAV;  Surgeon: Royston Cowper, MD;  Location: [facility] ORS;  Service: Urology;  Laterality: N/A;   PROSTATE BIOPSY N/A 09/11/2019   Procedure: PROSTATE BIOPSY Pearson Forster;  Surgeon: Royston Cowper, MD;  Location: [facility] ORS;  Service: Urology;  Laterality: N/A;   RETINAL DETACHMENT SURGERY Left    TRANSURETHRAL RESECTION OF PROSTATE     XI ROBOTIC ASSISTED VENTRAL HERNIA N/A 11/23/2021   Procedure: XI ROBOTIC ASSISTED VENTRAL HERNIA, incisional;  Surgeon: Olean Ree, MD;  Location: [facility] ORS;  Service: General;  Laterality: N/A;    Home Medications: Prior to Admission medications   Medication Sig Start Date End Date Taking? Authorizing Provider  acetaminophen (TYLENOL) 500 MG tablet Take 2 tablets (1,000 mg total) by mouth every 6 (six) hours as needed for mild pain. 01/31/22  Yes Carrol Bondar, Jacqulyn Bath, MD  amLODipine (NORVASC) 10 MG tablet Take 10 mg by mouth every morning.   Yes [provider]  aspirin EC 81 MG tablet Take 81 mg by mouth daily.   Yes [provider]  azelastine (ASTELIN) 0.1 % nasal spray Place 1 spray into both nostrils 2 (two) times daily as needed for rhinitis. Use in each nostril as directed  Yes [provider]  bicalutamide (CASODEX) 50 MG tablet Take 50 mg by mouth every morning. 10/23/19  Yes [provider]  enalapril (VASOTEC) 20 MG tablet Take 20 mg by mouth 2 (two) times daily.    Yes [provider]  fluticasone (FLONASE) 50 MCG/ACT nasal spray Place 1 spray into both nostrils daily as needed for allergies or rhinitis.   Yes [provider]  hydrALAZINE (APRESOLINE) 100 MG tablet Take 100 mg by mouth 3 (three) times daily.   Yes [provider]  lovastatin (MEVACOR) 20 MG tablet Take 20  mg by mouth at bedtime.   Yes [provider]  metoprolol (LOPRESSOR) 50 MG tablet Take 50 mg by mouth 2 (two) times daily.   Yes [provider]  polyethylene glycol (MIRALAX / GLYCOLAX) 17 g packet Take 17 g by mouth 3 (three) times a week.   Yes [provider]  sodium bicarbonate 650 MG tablet Take 650 mg by mouth 2 (two) times daily.    Yes [provider]  ULORIC 40 MG tablet Take 40 mg by mouth every morning. 03/09/17  Yes [provider]    Allergies: Allergies  Allergen Reactions   Fish Allergy Swelling   Ciprofloxacin     Unknown reaction   Doxazosin Other (See Comments)    unknown    Review of Systems: Review of Systems  Constitutional:  Negative for chills and fever.  HENT:  Negative for hearing loss.   Respiratory:  Negative for shortness of breath.   Cardiovascular:  Negative for chest pain.  Gastrointestinal:  Negative for abdominal pain, nausea and vomiting.  Genitourinary:  Negative for dysuria.  Musculoskeletal:  Negative for myalgias.  Skin:  Negative for rash.  Neurological:  Negative for dizziness.  Psychiatric/Behavioral:  Negative for depression.     Physical Exam BP 131/61   Pulse 60   Temp 98.1 F (36.7 C) (Oral)   Ht '5\' 8"'$  (1.727 m)   Wt 179 lb 3.2 oz (81.3 kg)   SpO2 100%   BMI 27.25 kg/m  CONSTITUTIONAL: No acute distress, well nourished. HEENT:  Normocephalic, atraumatic, extraocular motion intact. NECK:  Trachea is midline, no jugular venous distention. RESPIRATORY:  Lungs are clear, and breath sounds are equal bilaterally. Normal respiratory effort without pathologic use of accessory muscles. CARDIOVASCULAR: Heart is regular without murmurs, gallops, or rubs. GI: The abdomen is soft, non-distended, non-tender.  Incisions have healed well.  Catheter in place without evidence of infection.  MUSCULOSKELETAL:  Normal gait, no peripheral edema. NEUROLOGIC:  Motor and sensation is grossly normal.   Cranial nerves are grossly intact. PSYCH:  Alert and oriented to person, place and time. Affect is normal.  Labs/Imaging: Labs from 03/02/22: Na 146, K 4.0, Cl 108, CO2 27, BUN 48, Cr 11.5, albumin 3.2.  WBC 6.5, Hgb 6.8, Hct 20.7, Plt 93  KUB 03/03/22: IMPRESSION: 1. Peritoneal dialysis catheter coiled over the central pelvis. 2. Trace right pleural effusion. 3. Unremarkable bowel gas pattern.  Assessment and Plan: This is a 81 y.o. male with malfunctioning PD catheter.  --Discussed with the patient that since we have revised this catheter before, the next step would be to excise the catheter and replace it with a brand new one coming from the right side rather than left.  Unless there is some very clear reason why the current catheter is not working, we would then replace it altogether.  Discussed that this would be done laparoscopically as well and would  have the rep in the room to help with any troubleshooting.  Discussed the risks of bleeding, infection, injury to surrounding structures, the possibility that a brand new catheter will have the same problem in the future, and if that is the case, he would likely have to change to hemodialysis.  He understands this and all of his questions have been answered. --He is scheduled on 04/05/22.  I spent 40 minutes dedicated to the care of this patient on the date of this encounter to include pre-visit review of records, face-to-face time with the patient discussing diagnosis and management, and any post-visit coordination of care.   Melvyn Neth, [facility]

## 2022-04-03 ENCOUNTER — Encounter
Admission: RE | Admit: 2022-04-03 | Discharge: 2022-04-03 | Disposition: A | Discharge status: Home / Self Care | Payer: Medicare Other | Source: Ambulatory Visit | Attending: Surgery | Admitting: Surgery

## 2022-04-03 VITALS — Ht 69.0 in | Wt 185.0 lb

## 2022-04-03 DIAGNOSIS — N184 Chronic kidney disease, stage 4 (severe): Secondary | ICD-10-CM

## 2022-04-03 DIAGNOSIS — I10 Essential (primary) hypertension: Secondary | ICD-10-CM

## 2022-04-03 DIAGNOSIS — E1122 Type 2 diabetes mellitus with diabetic chronic kidney disease: Secondary | ICD-10-CM

## 2022-04-03 DIAGNOSIS — N185 Chronic kidney disease, stage 5: Secondary | ICD-10-CM

## 2022-04-03 DIAGNOSIS — D696 Thrombocytopenia, unspecified: Secondary | ICD-10-CM

## 2022-04-03 NOTE — Patient Instructions (Addendum)
Your procedure is scheduled on: Wednesday April 05, 2022. Report to Day Surgery inside [place] 2nd floor, stop by admissions desk before getting on elevator.  To find out your arrival time please call [phone] between 1PM - 3PM on Tuesday April 04, 2022.  Remember: Instructions that are not followed completely may result in serious medical risk,  up to and including death, or upon the discretion of your surgeon and anesthesiologist your  surgery may need to be rescheduled.     _X__ 1. Do not eat food after midnight the night before your procedure.                 No chewing gum or hard candies. You may drink clear liquids up to 2 hours                 before you are scheduled to arrive for your surgery- DO not drink clear                 liquids within 2 hours of the start of your surgery.                 Clear Liquids include:  water.  __X__2.  On the morning of surgery brush your teeth with toothpaste and water, you                may rinse your mouth with mouthwash if you wish.  Do not swallow any toothpaste or mouthwash.     _X__ 3.  No Alcohol for 24 hours before or after surgery.   _X__ 4.  Do Not Smoke or use e-cigarettes For 24 Hours Prior to Your Surgery.                 Do not use any chewable tobacco products for at least 6 hours prior to                 Surgery.  _X__  5.  Do not use any recreational drugs (marijuana, cocaine, heroin, ecstasy, MDMA or other)                For at least one week prior to your surgery.  Combination of these drugs with anesthesia                May have life threatening results.  ____  6.  Bring all medications with you on the day of surgery if instructed.   __X_7.  Notify your doctor if there is any change in your medical condition      (cold, fever, infections).     Do not wear jewelry, make-up, hairpins, clips or nail polish. Do not wear lotions, powders, or perfumes. You may wear deodorant. Do not  shave 48 hours prior to surgery. Men may shave face and neck. Do not bring valuables to the hospital.    [facility] is not responsible for any belongings or valuables.  Contacts, dentures or bridgework may not be worn into surgery. Leave your suitcase in the car. After surgery it may be brought to your room. For patients admitted to the hospital, discharge time is determined by your treatment team.   Patients discharged the day of surgery will not be allowed to drive home.   Make arrangements for someone to be with you for the first 24 hours of your Same Day Discharge.   __X__ Take these medicines the morning of surgery with A SIP OF WATER:    1.  amLODipine (NORVASC) 10 MG tablet  2. hydrALAZINE (APRESOLINE) 100 MG tablet  3. metoprolol (LOPRESSOR) 50 MG tablet  4. ULORIC 40 MG tablet  5.  6.  ____ Fleet Enema (as directed)   ___X_ Use CHG Soap (or wipes) as directed  ____ Use Benzoyl Peroxide Gel as instructed  ____ Use inhalers on the day of surgery  ____ Stop metformin 2 days prior to surgery    ____ Take 1/2 of usual insulin dose the night before surgery. No insulin the morning          of surgery.   __X__ Stop aspirin 81 mg 5 days before your procedure as instructed by your doctor.   __X__ One Week prior to surgery- Stop Anti-inflammatories such as Ibuprofen, Aleve, Advil, Motrin, meloxicam (MOBIC), diclofenac, etodolac, ketorolac, Toradol, Daypro, piroxicam, Goody's or BC powders. OK TO USE TYLENOL IF NEEDED   __X__ Stop supplements until after surgery.    ____ Bring C-Pap to the hospital.    If you have any questions regarding your pre-procedure instructions,  Please call Pre-admit Testing at [phone]

## 2022-04-04 ENCOUNTER — Encounter: Payer: Self-pay | Admitting: Urgent Care

## 2022-04-04 ENCOUNTER — Encounter
Admission: RE | Admit: 2022-04-04 | Discharge: 2022-04-04 | Disposition: A | Discharge status: Home / Self Care | Payer: Medicare Other | Source: Ambulatory Visit | Attending: Surgery | Admitting: Surgery

## 2022-04-04 DIAGNOSIS — I10 Essential (primary) hypertension: Secondary | ICD-10-CM | POA: Diagnosis not present

## 2022-04-04 DIAGNOSIS — D631 Anemia in chronic kidney disease: Secondary | ICD-10-CM | POA: Insufficient documentation

## 2022-04-04 DIAGNOSIS — E1122 Type 2 diabetes mellitus with diabetic chronic kidney disease: Secondary | ICD-10-CM | POA: Diagnosis not present

## 2022-04-04 DIAGNOSIS — N184 Chronic kidney disease, stage 4 (severe): Secondary | ICD-10-CM | POA: Insufficient documentation

## 2022-04-04 DIAGNOSIS — D696 Thrombocytopenia, unspecified: Secondary | ICD-10-CM | POA: Insufficient documentation

## 2022-04-04 DIAGNOSIS — Z01812 Encounter for preprocedural laboratory examination: Secondary | ICD-10-CM | POA: Diagnosis present

## 2022-04-04 LAB — CBC
HCT: 20.2 % — ABNORMAL LOW (ref 39.0–52.0)
Hemoglobin: 6.5 g/dL — ABNORMAL LOW (ref 13.0–17.0)
MCH: 28.5 pg (ref 26.0–34.0)
MCHC: 32.2 g/dL (ref 30.0–36.0)
MCV: 88.6 fL (ref 80.0–100.0)
Platelets: 124 10*3/uL — ABNORMAL LOW (ref 150–400)
RBC: 2.28 MIL/uL — ABNORMAL LOW (ref 4.22–5.81)
RDW: 17.3 % — ABNORMAL HIGH (ref 11.5–15.5)
WBC: 5.8 10*3/uL (ref 4.0–10.5)
nRBC: 0 % (ref 0.0–0.2)

## 2022-04-04 LAB — BASIC METABOLIC PANEL
Anion gap: 9 (ref 5–15)
BUN: 21 mg/dL (ref 8–23)
CO2: 27 mmol/L (ref 22–32)
Calcium: 8.5 mg/dL — ABNORMAL LOW (ref 8.9–10.3)
Chloride: 106 mmol/L (ref 98–111)
Creatinine, Ser: 4.21 mg/dL — ABNORMAL HIGH (ref 0.61–1.24)
GFR, Estimated: 14 mL/min — ABNORMAL LOW (ref >60)
Glucose, Bld: 96 mg/dL (ref 70–99)
Potassium: 3.2 mmol/L — ABNORMAL LOW (ref 3.5–5.1)
Sodium: 142 mmol/L (ref 135–145)

## 2022-04-04 MED ORDER — CHLORHEXIDINE GLUCONATE CLOTH 2 % EX PADS
6.0000 | MEDICATED_PAD | Freq: Once | CUTANEOUS | Status: AC
  Administered 2022-04-05: 6 via TOPICAL

## 2022-04-04 MED ORDER — CHLORHEXIDINE GLUCONATE 0.12 % MT SOLN: 15.0000 mL | Freq: Once | OROMUCOSAL | Status: AC

## 2022-04-04 MED ORDER — ORAL CARE MOUTH RINSE: 15.0000 mL | Freq: Once | OROMUCOSAL | Status: AC

## 2022-04-04 MED ORDER — ACETAMINOPHEN 500 MG PO TABS: 1000.0000 mg | ORAL_TABLET | ORAL | Status: DC

## 2022-04-04 MED ORDER — BUPIVACAINE LIPOSOME 1.3 % IJ SUSP: 20.0000 mL | Freq: Once | INTRAMUSCULAR | Status: DC

## 2022-04-04 MED ORDER — SODIUM CHLORIDE 0.9 % IV SOLN: INTRAVENOUS | Status: DC

## 2022-04-04 MED ORDER — CEFAZOLIN SODIUM-DEXTROSE 2-4 GM/100ML-% IV SOLN: 2.0000 g | INTRAVENOUS | Status: DC

## 2022-04-04 MED ORDER — GABAPENTIN 100 MG PO CAPS
200.0000 mg | ORAL_CAPSULE | ORAL | Status: AC
  Administered 2022-04-05: 200 mg via ORAL

## 2022-04-04 MED ORDER — FAMOTIDINE 20 MG PO TABS: 20.0000 mg | ORAL_TABLET | Freq: Once | ORAL | Status: AC

## 2022-04-04 NOTE — Progress Notes (Signed)
  [facility] Perioperative Services: Pre-Admission/Anesthesia Testing  Abnormal Lab Notification   Date: 04/04/22  Name: [name] [name] MRN:   [id number]  Re: Abnormal labs noted during PAT appointment   Notified:  Provider Name Provider Role Notification Mode  Olean Ree, MD General Surgery Routed and/or faxed via [place] and Notes:  ABNORMAL LAB VALUE(S): Lab Results  Component Value Date   HGB 6.5 (L) 04/04/2022   HCT 20.2 (L) 04/04/2022   [name] [name] is scheduled for a LAPAROSCOPIC REVISION CONTINUOUS AMBULATORY PERITONEAL DIALYSIS  (CAPD) CATHETER, possible replacement on 04/03/2022.   Patient with chronically low hemoglobin, however this is the lowest he has been when review levels dating back to 2017. Patient was transfused on 03/17/2022; received 1 unit PRBCs.   Given the non-emergent nature of this procedure, it may be appropriate to either postpone pending further optimization of his hemoglobin, or at the very least, consider having patient come in earlier than scheduled for pre-procedural transfusion for at least 1 unit pf PRBCs.   Sending result to surgeon to make him aware of result and to determine plans for surgery vs. need for further pre-procedural optimization.  Honor Loh, MSN, APRN, FNP-C, CEN [facility]  Peri-operative Services Nurse Practitioner Phone: [phone] Fax: [phone] 04/04/22 1:26 PM

## 2022-04-05 ENCOUNTER — Encounter: Payer: Self-pay | Admitting: Surgery

## 2022-04-05 ENCOUNTER — Encounter
Admission: AD | Disposition: A | Discharge status: Home / Self Care | Payer: Self-pay | Source: Home / Self Care | Attending: Internal Medicine

## 2022-04-05 ENCOUNTER — Inpatient Hospital Stay
Admission: AD | Admit: 2022-04-05 | Discharge: 2022-04-08 | DRG: 907 | Disposition: A | Discharge status: Home / Self Care | Payer: Medicare Other | Attending: Internal Medicine | Admitting: Internal Medicine

## 2022-04-05 ENCOUNTER — Other Ambulatory Visit: Payer: Self-pay

## 2022-04-05 DIAGNOSIS — Z806 Family history of leukemia: Secondary | ICD-10-CM

## 2022-04-05 DIAGNOSIS — K219 Gastro-esophageal reflux disease without esophagitis: Secondary | ICD-10-CM | POA: Diagnosis present

## 2022-04-05 DIAGNOSIS — N2581 Secondary hyperparathyroidism of renal origin: Secondary | ICD-10-CM | POA: Diagnosis present

## 2022-04-05 DIAGNOSIS — E1122 Type 2 diabetes mellitus with diabetic chronic kidney disease: Secondary | ICD-10-CM

## 2022-04-05 DIAGNOSIS — E782 Mixed hyperlipidemia: Secondary | ICD-10-CM | POA: Diagnosis present

## 2022-04-05 DIAGNOSIS — Z882 Allergy status to sulfonamides status: Secondary | ICD-10-CM

## 2022-04-05 DIAGNOSIS — Z7982 Long term (current) use of aspirin: Secondary | ICD-10-CM

## 2022-04-05 DIAGNOSIS — D631 Anemia in chronic kidney disease: Secondary | ICD-10-CM

## 2022-04-05 DIAGNOSIS — N186 End stage renal disease: Secondary | ICD-10-CM | POA: Diagnosis present

## 2022-04-05 DIAGNOSIS — I120 Hypertensive chronic kidney disease with stage 5 chronic kidney disease or end stage renal disease: Secondary | ICD-10-CM | POA: Diagnosis present

## 2022-04-05 DIAGNOSIS — D649 Anemia, unspecified: Secondary | ICD-10-CM

## 2022-04-05 DIAGNOSIS — Y812 Prosthetic and other implants, materials and accessory general- and plastic-surgery devices associated with adverse incidents: Secondary | ICD-10-CM | POA: Diagnosis present

## 2022-04-05 DIAGNOSIS — M109 Gout, unspecified: Secondary | ICD-10-CM | POA: Diagnosis present

## 2022-04-05 DIAGNOSIS — T85611A Breakdown (mechanical) of intraperitoneal dialysis catheter, initial encounter: Principal | ICD-10-CM

## 2022-04-05 DIAGNOSIS — E8721 Acute metabolic acidosis: Secondary | ICD-10-CM | POA: Diagnosis not present

## 2022-04-05 DIAGNOSIS — Z79899 Other long term (current) drug therapy: Secondary | ICD-10-CM

## 2022-04-05 DIAGNOSIS — Z992 Dependence on renal dialysis: Secondary | ICD-10-CM

## 2022-04-05 DIAGNOSIS — N185 Chronic kidney disease, stage 5: Secondary | ICD-10-CM

## 2022-04-05 DIAGNOSIS — Z888 Allergy status to other drugs, medicaments and biological substances status: Secondary | ICD-10-CM

## 2022-04-05 DIAGNOSIS — I10 Essential (primary) hypertension: Secondary | ICD-10-CM | POA: Diagnosis present

## 2022-04-05 LAB — POCT I-STAT, CHEM 8
BUN: 25 mg/dL — ABNORMAL HIGH (ref 8–23)
Calcium, Ion: 1.11 mmol/L — ABNORMAL LOW (ref 1.15–1.40)
Chloride: 104 mmol/L (ref 98–111)
Creatinine, Ser: 6.2 mg/dL — ABNORMAL HIGH (ref 0.61–1.24)
Glucose, Bld: 108 mg/dL — ABNORMAL HIGH (ref 70–99)
HCT: 18 % — ABNORMAL LOW (ref 39.0–52.0)
Hemoglobin: 6.1 g/dL — CL (ref 13.0–17.0)
Potassium: 3.5 mmol/L (ref 3.5–5.1)
Sodium: 143 mmol/L (ref 135–145)
TCO2: 24 mmol/L (ref 22–32)

## 2022-04-05 LAB — RETICULOCYTES
Immature Retic Fract: 12.1 % (ref 2.3–15.9)
RBC.: 2.6 MIL/uL — ABNORMAL LOW (ref 4.22–5.81)
Retic Count, Absolute: 22.6 10*3/uL (ref 19.0–186.0)
Retic Ct Pct: 0.9 % (ref 0.4–3.1)

## 2022-04-05 LAB — FOLATE: Folate: 11.7 ng/mL (ref >5.9)

## 2022-04-05 LAB — VITAMIN B12: Vitamin B-12: 224 pg/mL (ref 180–914)

## 2022-04-05 LAB — IRON AND TIBC
Iron: 65 ug/dL (ref 45–182)
Saturation Ratios: 29 % (ref 17.9–39.5)
TIBC: 225 ug/dL — ABNORMAL LOW (ref 250–450)
UIBC: 160 ug/dL

## 2022-04-05 LAB — PREPARE RBC (CROSSMATCH)

## 2022-04-05 LAB — HEMOGLOBIN AND HEMATOCRIT, BLOOD
HCT: 23.2 % — ABNORMAL LOW (ref 39.0–52.0)
Hemoglobin: 7.8 g/dL — ABNORMAL LOW (ref 13.0–17.0)

## 2022-04-05 LAB — FERRITIN: Ferritin: 535 ng/mL — ABNORMAL HIGH (ref 24–336)

## 2022-04-05 SURGERY — LAPAROSCOPIC REVISION CONTINUOUS AMBULATORY PERITONEAL DIALYSIS  (CAPD) CATHETER
Anesthesia: General

## 2022-04-05 MED ORDER — ACETAMINOPHEN 500 MG PO TABS
ORAL_TABLET | ORAL | Status: AC
  Filled 2022-04-05: qty 2

## 2022-04-05 MED ORDER — SODIUM CHLORIDE 0.9% IV SOLUTION: Freq: Once | INTRAVENOUS | Status: DC

## 2022-04-05 MED ORDER — BICALUTAMIDE 50 MG PO TABS
50.0000 mg | ORAL_TABLET | ORAL | Status: DC
  Administered 2022-04-06 – 2022-04-08 (×3): 50 mg via ORAL
  Filled 2022-04-05 (×3): qty 1

## 2022-04-05 MED ORDER — GABAPENTIN 100 MG PO CAPS
ORAL_CAPSULE | ORAL | Status: AC
  Filled 2022-04-05: qty 2

## 2022-04-05 MED ORDER — ONDANSETRON HCL 4 MG/2ML IJ SOLN: 4.0000 mg | Freq: Four times a day (QID) | INTRAMUSCULAR | Status: DC | PRN

## 2022-04-05 MED ORDER — ACETAMINOPHEN 650 MG RE SUPP: 650.0000 mg | Freq: Four times a day (QID) | RECTAL | Status: DC | PRN

## 2022-04-05 MED ORDER — METOPROLOL TARTRATE 50 MG PO TABS
50.0000 mg | ORAL_TABLET | Freq: Two times a day (BID) | ORAL | Status: DC
  Administered 2022-04-06 – 2022-04-08 (×3): 50 mg via ORAL
  Filled 2022-04-05 (×5): qty 1

## 2022-04-05 MED ORDER — HYDRALAZINE HCL 50 MG PO TABS
100.0000 mg | ORAL_TABLET | Freq: Three times a day (TID) | ORAL | Status: DC
  Administered 2022-04-05 – 2022-04-08 (×6): 100 mg via ORAL
  Filled 2022-04-05 (×7): qty 2

## 2022-04-05 MED ORDER — GABAPENTIN 300 MG PO CAPS
ORAL_CAPSULE | ORAL | Status: AC
  Filled 2022-04-05: qty 1

## 2022-04-05 MED ORDER — PRAVASTATIN SODIUM 20 MG PO TABS
20.0000 mg | ORAL_TABLET | Freq: Every day | ORAL | Status: DC
  Administered 2022-04-05 – 2022-04-07 (×2): 20 mg via ORAL
  Filled 2022-04-05 (×2): qty 1

## 2022-04-05 MED ORDER — CEFAZOLIN SODIUM-DEXTROSE 2-4 GM/100ML-% IV SOLN
INTRAVENOUS | Status: AC
  Filled 2022-04-05: qty 100

## 2022-04-05 MED ORDER — FAMOTIDINE 20 MG PO TABS
ORAL_TABLET | ORAL | Status: AC
  Administered 2022-04-05: 20 mg via ORAL
  Filled 2022-04-05: qty 1

## 2022-04-05 MED ORDER — ENALAPRIL MALEATE 20 MG PO TABS
20.0000 mg | ORAL_TABLET | Freq: Two times a day (BID) | ORAL | Status: DC
  Administered 2022-04-05 – 2022-04-08 (×4): 20 mg via ORAL
  Filled 2022-04-05 (×6): qty 1

## 2022-04-05 MED ORDER — POLYETHYLENE GLYCOL 3350 17 G PO PACK: 17.0000 g | PACK | ORAL | Status: DC

## 2022-04-05 MED ORDER — CHLORHEXIDINE GLUCONATE 0.12 % MT SOLN
OROMUCOSAL | Status: AC
  Administered 2022-04-05: 15 mL via OROMUCOSAL
  Filled 2022-04-05: qty 15

## 2022-04-05 MED ORDER — FLUTICASONE PROPIONATE 50 MCG/ACT NA SUSP: 1.0000 | Freq: Every day | NASAL | Status: DC | PRN

## 2022-04-05 MED ORDER — CINACALCET HCL 30 MG PO TABS
30.0000 mg | ORAL_TABLET | Freq: Every day | ORAL | Status: DC
  Administered 2022-04-05 – 2022-04-08 (×3): 30 mg via ORAL
  Filled 2022-04-05 (×4): qty 1

## 2022-04-05 MED ORDER — SODIUM CHLORIDE 0.9 % IV SOLN: 250.0000 mL | INTRAVENOUS | Status: DC | PRN

## 2022-04-05 MED ORDER — CHLORHEXIDINE GLUCONATE CLOTH 2 % EX PADS
6.0000 | MEDICATED_PAD | Freq: Every day | CUTANEOUS | Status: DC
  Administered 2022-04-05 – 2022-04-06 (×2): 6 via TOPICAL

## 2022-04-05 MED ORDER — CEFAZOLIN SODIUM-DEXTROSE 2-4 GM/100ML-% IV SOLN
2.0000 g | INTRAVENOUS | Status: AC
Start: 2022-04-06 — End: 2022-04-06
  Administered 2022-04-06: 2 g via INTRAVENOUS
  Filled 2022-04-05: qty 100

## 2022-04-05 MED ORDER — AMLODIPINE BESYLATE 10 MG PO TABS
10.0000 mg | ORAL_TABLET | ORAL | Status: DC
  Administered 2022-04-06 – 2022-04-08 (×2): 10 mg via ORAL
  Filled 2022-04-05 (×3): qty 1

## 2022-04-05 MED ORDER — SODIUM CHLORIDE 0.9% FLUSH: 3.0000 mL | INTRAVENOUS | Status: DC | PRN

## 2022-04-05 MED ORDER — SODIUM CHLORIDE 0.9% FLUSH
3.0000 mL | Freq: Two times a day (BID) | INTRAVENOUS | Status: DC
  Administered 2022-04-05 – 2022-04-08 (×4): 3 mL via INTRAVENOUS

## 2022-04-05 MED ORDER — SODIUM BICARBONATE 650 MG PO TABS
650.0000 mg | ORAL_TABLET | Freq: Two times a day (BID) | ORAL | Status: DC
  Administered 2022-04-05 – 2022-04-08 (×5): 650 mg via ORAL
  Filled 2022-04-05 (×6): qty 1

## 2022-04-05 MED ORDER — SODIUM CHLORIDE FLUSH 0.9 % IV SOLN
INTRAVENOUS | Status: AC
  Filled 2022-04-05: qty 10

## 2022-04-05 MED ORDER — ACETAMINOPHEN 325 MG PO TABS: 650.0000 mg | ORAL_TABLET | Freq: Four times a day (QID) | ORAL | Status: DC | PRN

## 2022-04-05 MED ORDER — ONDANSETRON HCL 4 MG PO TABS: 4.0000 mg | ORAL_TABLET | Freq: Four times a day (QID) | ORAL | Status: DC | PRN

## 2022-04-05 MED ORDER — POLYETHYLENE GLYCOL 3350 17 G PO PACK: 17.0000 g | PACK | Freq: Every day | ORAL | Status: DC | PRN

## 2022-04-05 MED ORDER — FEBUXOSTAT 40 MG PO TABS
40.0000 mg | ORAL_TABLET | ORAL | Status: DC
  Administered 2022-04-06 – 2022-04-08 (×3): 40 mg via ORAL
  Filled 2022-04-05 (×3): qty 1

## 2022-04-05 SURGICAL SUPPLY — 42 items
ADAPTER CATH DIALYSIS 4X8 IT L (MISCELLANEOUS) IMPLANT
BLADE SURG 11 STRL SS SAFETY (MISCELLANEOUS) ×2 IMPLANT
CATH EXTENDED DIALYSIS (CATHETERS) ×2 IMPLANT
DERMABOND ADVANCED (GAUZE/BANDAGES/DRESSINGS) ×2
DERMABOND ADVANCED .7 DNX12 (GAUZE/BANDAGES/DRESSINGS) ×1 IMPLANT
ELECT CAUTERY BLADE 6.4 (BLADE) ×2 IMPLANT
ELECT REM PT RETURN 9FT ADLT (ELECTROSURGICAL) ×2
ELECTRODE REM PT RTRN 9FT ADLT (ELECTROSURGICAL) ×1 IMPLANT
GLOVE SURG SYN 7.0 (GLOVE) ×2 IMPLANT
GLOVE SURG SYN 7.5  E (GLOVE) ×2
GLOVE SURG SYN 7.5 E (GLOVE) ×1 IMPLANT
GOWN STRL REUS W/ TWL LRG LVL3 (GOWN DISPOSABLE) ×2 IMPLANT
GOWN STRL REUS W/TWL LRG LVL3 (GOWN DISPOSABLE) ×4
IV NS 1000ML (IV SOLUTION) ×2
IV NS 1000ML BAXH (IV SOLUTION) ×1 IMPLANT
KIT TURNOVER KIT A (KITS) ×2 IMPLANT
LABEL OR SOLS (LABEL) ×2 IMPLANT
MANIFOLD NEPTUNE II (INSTRUMENTS) ×2 IMPLANT
MINICAP W/POVIDONE IODINE SOL (MISCELLANEOUS) ×2 IMPLANT
NEEDLE HYPO 22GX1.5 SAFETY (NEEDLE) ×2 IMPLANT
NEEDLE INSUFFLATION 14GA 120MM (NEEDLE) ×2 IMPLANT
NS IRRIG 500ML POUR BTL (IV SOLUTION) ×2 IMPLANT
PACK LAP CHOLECYSTECTOMY (MISCELLANEOUS) ×2 IMPLANT
SET CYSTO W/LG BORE CLAMP LF (SET/KITS/TRAYS/PACK) ×2 IMPLANT
SET TRANSFER 6 W/TWIST CLAMP 5 (SET/KITS/TRAYS/PACK) IMPLANT
SET TUBE SMOKE EVAC HIGH FLOW (TUBING) ×2 IMPLANT
SLEEVE ADV FIXATION 5X100MM (TROCAR) ×4 IMPLANT
SPONGE DRAIN TRACH 4X4 STRL 2S (GAUZE/BANDAGES/DRESSINGS) ×2 IMPLANT
STYLET FALLER (MISCELLANEOUS) IMPLANT
STYLET FALLER MEDIONICS (MISCELLANEOUS) IMPLANT
SUT ETHILON 2 0 FS 18 (SUTURE) ×2 IMPLANT
SUT MNCRL 4-0 (SUTURE) ×2
SUT MNCRL 4-0 27XMFL (SUTURE) ×1
SUT VIC AB 3-0 SH 27 (SUTURE) ×2
SUT VIC AB 3-0 SH 27X BRD (SUTURE) ×1 IMPLANT
SUT VICRYL 0 AB UR-6 (SUTURE) ×2 IMPLANT
SUTURE MNCRL 4-0 27XMF (SUTURE) ×1 IMPLANT
SYS KII FIOS ACCESS ABD 5X100 (TROCAR) ×2
SYSTEM KII FIOS ACES ABD 5X100 (TROCAR) ×1 IMPLANT
TRAP FLUID SMOKE EVACUATOR (MISCELLANEOUS) ×2 IMPLANT
TROCAR KII BLADELESS 5X150 (TROCAR) ×2 IMPLANT
WATER STERILE IRR 500ML POUR (IV SOLUTION) ×2 IMPLANT

## 2022-04-05 NOTE — Assessment & Plan Note (Addendum)
Patient will be going for his planned laparoscopic surgery for repositioning of his malfunctioning PD catheter tomorrow. -Care per surgery and nephrology.

## 2022-04-05 NOTE — Assessment & Plan Note (Signed)
-   Continue with statin

## 2022-04-05 NOTE — H&P (Signed)
History and Physical    Patient: [name] [name] DVV:[id number] DOB: 12/22/1940 DOA: 04/05/2022 DOS: the patient was seen and examined on 04/05/2022 PCP: Dion Body, MD  Patient coming from: Home  Chief Complaint: No chief complaint on file.  HPI: [name] [name] is a 81 y.o. male with medical history significant of ESRD on dialysis, hypertension, gout and hyperlipidemia was came to the hospital to have his surgery for replacement of malfunctioning PD catheter and found to have hemoglobin of 6.5 which decreased to 6.1 on subsequent check.  Surgery got canceled and [facility] was requested to do admission for blood transfusion.  Patient denies any obvious bleeding.  He was getting 1 unit of PRBC at the time of interview.  No recent illnesses.  No chest pain, shortness of breath or orthopnea.  No upper respiratory symptoms.  No urinary symptoms.  No recent change in his appetite or bowel habits.  Patient was having trouble with his PD catheter for a while and was placed on hemodialysis for the past 1-1/2 months.  He would like to continue peritoneal dialysis so he can stay at home to take care of his sick wife.  Review of Systems: As mentioned in the history of present illness. All other systems reviewed and are negative. Past Medical History:  Diagnosis Date   Anemia    Arthritis    Cancer ([place])    Chronic kidney disease    Stage 4 CKD   GERD (gastroesophageal reflux disease)    h/o   Gout    Hyperlipidemia, mixed    Hypertension    Sleep apnea    h/o no cpap   Past Surgical History:  Procedure Laterality Date   APPENDECTOMY     Bunion Removal Right    CAPD INSERTION N/A 11/23/2021   Procedure: LAPAROSCOPIC INSERTION CONTINUOUS AMBULATORY PERITONEAL DIALYSIS  (CAPD) CATHETER;  Surgeon: Olean Ree, MD;  Location: [facility] ORS;  Service: General;  Laterality: N/A;   CAPD REVISION N/A 01/31/2022   Procedure: LAPAROSCOPIC REVISION CONTINUOUS AMBULATORY PERITONEAL DIALYSIS  (CAPD)  CATHETER;  Surgeon: Olean Ree, MD;  Location: [facility] ORS;  Service: General;  Laterality: N/A;   COLONOSCOPY WITH PROPOFOL N/A 06/14/2015   Procedure: COLONOSCOPY WITH PROPOFOL;  Surgeon: Lollie Sails, MD;  Location: [facility] ENDOSCOPY;  Service: Endoscopy;  Laterality: N/A;   DIALYSIS/PERMA CATHETER INSERTION N/A 10/03/2021   Procedure: DIALYSIS/PERMA CATHETER INSERTION;  Surgeon: Algernon Huxley, MD;  Location: [place] [facility];  Service: Cardiovascular;  Laterality: N/A;   EYE SURGERY     HERNIA REPAIR     abd   INSERTION OF MESH  11/23/2021   Procedure: INSERTION OF MESH;  Surgeon: Olean Ree, MD;  Location: [facility] ORS;  Service: General;;   PROSTATE BIOPSY N/A 03/27/2017   Procedure: PROSTATE BIOPSY-URO NAV;  Surgeon: Royston Cowper, MD;  Location: [facility] ORS;  Service: Urology;  Laterality: N/A;   PROSTATE BIOPSY N/A 09/11/2019   Procedure: PROSTATE BIOPSY Pearson Forster;  Surgeon: Royston Cowper, MD;  Location: [facility] ORS;  Service: Urology;  Laterality: N/A;   RETINAL DETACHMENT SURGERY Left    TRANSURETHRAL RESECTION OF PROSTATE     XI ROBOTIC ASSISTED VENTRAL HERNIA N/A 11/23/2021   Procedure: XI ROBOTIC ASSISTED VENTRAL HERNIA, incisional;  Surgeon: Olean Ree, MD;  Location: [facility] ORS;  Service: General;  Laterality: N/A;   Social History:  reports that he has never smoked. He has never used smokeless tobacco. He reports that he does not drink alcohol and does not  use drugs.  Allergies  Allergen Reactions   Fish Allergy Swelling   Ciprofloxacin     Unknown reaction   Doxazosin Other (See Comments)    unknown    Family History  Problem Relation Age of Onset   Leukemia Father    Leukemia Brother     Prior to Admission medications   Medication Sig Start Date End Date Taking? Authorizing Provider  acetaminophen (TYLENOL) 500 MG tablet Take 2 tablets (1,000 mg total) by mouth every 6 (six) hours as needed for mild pain. 01/31/22  Yes Piscoya, Jacqulyn Bath, MD  amLODipine  (NORVASC) 10 MG tablet Take 10 mg by mouth every morning.   Yes [provider]  azelastine (ASTELIN) 0.1 % nasal spray Place 1 spray into both nostrils 2 (two) times daily as needed for rhinitis. Use in each nostril as directed   Yes [provider]  bicalutamide (CASODEX) 50 MG tablet Take 50 mg by mouth every morning. 10/23/19  Yes [provider]  cinacalcet (SENSIPAR) 30 MG tablet Take 30 mg by mouth daily.   Yes [provider]  enalapril (VASOTEC) 20 MG tablet Take 20 mg by mouth 2 (two) times daily.    Yes [provider]  fluticasone (FLONASE) 50 MCG/ACT nasal spray Place 1 spray into both nostrils daily as needed for allergies or rhinitis.   Yes [provider]  hydrALAZINE (APRESOLINE) 100 MG tablet Take 100 mg by mouth 3 (three) times daily.   Yes [provider]  lovastatin (MEVACOR) 20 MG tablet Take 20 mg by mouth at bedtime.   Yes [provider]  metoprolol (LOPRESSOR) 50 MG tablet Take 50 mg by mouth 2 (two) times daily.   Yes [provider]  polyethylene glycol (MIRALAX / GLYCOLAX) 17 g packet Take 17 g by mouth 3 (three) times a week.   Yes [provider]  sodium bicarbonate 650 MG tablet Take 650 mg by mouth 2 (two) times daily.    Yes [provider]  ULORIC 40 MG tablet Take 40 mg by mouth every morning. 03/09/17  Yes [provider]  aspirin EC 81 MG tablet Take 81 mg by mouth daily. Patient not taking: Reported on 04/03/2022    [provider]    Physical Exam: Vitals:   04/05/22 0717 04/05/22 0842 04/05/22 0910 04/05/22 1115  BP: 134/61 (!) 119/55 123/61 133/63  Pulse: 68 (!) 55 (!) 55 (!) 55  Resp:  '20 20 20  '$ Temp: 97.7 F (36.5 C) (!) 97 F (36.1 C) (!) 97.2 F (36.2 C) 97.7 F (36.5 C)  TempSrc: Oral Temporal Temporal Temporal  SpO2: 100% 99% 99% 100%  Weight: 83.9 kg     Height: '5\' 9"'$  (1.753 m)       General: Vital signs reviewed.  Patient  is well-developed and well-nourished, in no acute distress and cooperative with exam.  Head: Normocephalic and atraumatic. Eyes: EOMI, conjunctivae normal, no scleral icterus.  Neck: Supple, trachea midline, normal ROM, no JVD,  Cardiovascular: RRR, S1 normal, S2 normal, no murmurs, gallops, or rubs. Pulmonary/Chest: Clear to auscultation bilaterally, no wheezes, rales, or rhonchi. Abdominal: Soft, non-tender, non-distended, BS +, left-sided PD catheter in place. Extremities: No lower extremity edema bilaterally,  pulses symmetric and intact bilaterally.  Neurological: A&O x3, Strength is normal and symmetric bilaterally, cranial nerve II-XII are grossly intact, no focal motor deficit, sensory intact to light touch bilaterally.  Psychiatric: Normal mood and affect.   Data Reviewed: Prior data reviewed.  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 Hemoglobin improved to 7.8 after getting 1 unit of PRBC.  No sign of any obvious bleeding. -Check anemia panel -Give him 1 more unit to keep the hemoglobin above 8 for surgery -Monitor hemoglobin -Transfuse If below 8  PD catheter dysfunction [facility]) Patient will be going for his planned laparoscopic surgery for repositioning of his malfunctioning PD catheter tomorrow. -Care per surgery and nephrology.  ESRD on dialysis [facility]) Nephrology is aware.  Currently on hemodialysis for the past 1-1/2 months due to malfunctioning PD catheter. -Current continue his scheduled dialysis -Continue home bicarb and Sensipar  Benign essential hypertension Blood pressure within goal. On multiple medications at home which include amlodipine 10 mg, enalapril 20 mg twice daily, hydralazine 100 mg 3 times daily, metoprolol 50 mg twice daily. -Continue home meds  Mixed hyperlipidemia - Continue with statin  Advance Care Planning:   Code Status: Full Code   Consults: Nephrology, general  surgery  Family Communication: Discussed with daughter at bedside  Severity of Illness: The appropriate patient status for this patient is OBSERVATION. Observation status is judged to be reasonable and necessary in order to provide the required intensity of service to ensure the patient's safety. The patient's presenting symptoms, physical exam findings, and initial radiographic and laboratory data in the context of their medical condition is felt to place them at decreased risk for further clinical deterioration. Furthermore, it is anticipated that the patient will be medically stable for discharge from the hospital within 2 midnights of admission.   Author: Lorella Nimrod, MD 04/05/2022 1:41 PM  For on call review [email or web address].

## 2022-04-05 NOTE — Assessment & Plan Note (Signed)
Blood pressure within goal. On multiple medications at home which include amlodipine 10 mg, enalapril 20 mg twice daily, hydralazine 100 mg 3 times daily, metoprolol 50 mg twice daily. -Continue home meds

## 2022-04-05 NOTE — Anesthesia Preprocedure Evaluation (Deleted)

## 2022-04-05 NOTE — Assessment & Plan Note (Signed)
Most likely anemia of chronic disease secondary to his end-stage renal disease. Hemoglobin improved to 7.8 after getting 1 unit of PRBC.  No sign of any obvious bleeding. -Check anemia panel -Give him 1 more unit to keep the hemoglobin above 8 for surgery -Monitor hemoglobin -Transfuse If below 8

## 2022-04-05 NOTE — H&P (Signed)
04/05/22  Patient presents today for planned elective surgery -- laparoscopic peritoneal dialysis catheter revision / replacement.  On his preop labs yesterday, his Hgb was low at 6.5.  I discussed with Dr. Holley Raring with nephrology and he was able to coordinate for the patient to come in early to the hospital this morning to set him up for transfusion of 1 pRBC.  On repeat check this morning, his Hgb is lower at 6.1.  Given that he is a dialysis patient, the transfusion has to be slow for 3 hrs and the blood bank needs 1 hr after transfusion is done to collection repeat H&H.  Unfortunately, given the OR time constraints today, cannot really afford this delay based on all the other cases ongoing today in the OR.  Discussed with Dr. Holley Raring again and he recommended admission to hospital so he can get a total of 2 units and be able to do surgery tomorrow.  Based on schedule, I would be able to add him for surgery tomorrow.  I discussed with patient and he and his daughter are in agreement.  Will ask hospitalist team for admission given his comorbidities.   Melvyn Neth, MD

## 2022-04-05 NOTE — Assessment & Plan Note (Addendum)
Nephrology is aware.  Currently on hemodialysis for the past 1-1/2 months due to malfunctioning PD catheter. -Current continue his scheduled dialysis -Continue home bicarb and Sensipar

## 2022-04-06 ENCOUNTER — Observation Stay: Payer: Medicare Other | Admitting: Anesthesiology

## 2022-04-06 ENCOUNTER — Ambulatory Visit: Admission: RE | Admit: 2022-04-06 | Payer: Medicare Other | Source: Home / Self Care | Admitting: Surgery

## 2022-04-06 ENCOUNTER — Encounter
Admission: AD | Disposition: A | Discharge status: Home / Self Care | Payer: Self-pay | Source: Home / Self Care | Attending: Internal Medicine

## 2022-04-06 DIAGNOSIS — D649 Anemia, unspecified: Secondary | ICD-10-CM | POA: Diagnosis not present

## 2022-04-06 DIAGNOSIS — T85611D Breakdown (mechanical) of intraperitoneal dialysis catheter, subsequent encounter: Secondary | ICD-10-CM

## 2022-04-06 DIAGNOSIS — D631 Anemia in chronic kidney disease: Secondary | ICD-10-CM | POA: Diagnosis not present

## 2022-04-06 DIAGNOSIS — N184 Chronic kidney disease, stage 4 (severe): Secondary | ICD-10-CM | POA: Diagnosis not present

## 2022-04-06 DIAGNOSIS — N186 End stage renal disease: Secondary | ICD-10-CM

## 2022-04-06 DIAGNOSIS — Z992 Dependence on renal dialysis: Secondary | ICD-10-CM

## 2022-04-06 HISTORY — PX: CAPD REVISION: SHX5260

## 2022-04-06 LAB — TYPE AND SCREEN
ABO/RH(D): O POS
Antibody Screen: NEGATIVE
Unit division: 0
Unit division: 0

## 2022-04-06 LAB — BASIC METABOLIC PANEL
Anion gap: 6 (ref 5–15)
BUN: 36 mg/dL — ABNORMAL HIGH (ref 8–23)
CO2: 25 mmol/L (ref 22–32)
Calcium: 8 mg/dL — ABNORMAL LOW (ref 8.9–10.3)
Chloride: 109 mmol/L (ref 98–111)
Creatinine, Ser: 6.09 mg/dL — ABNORMAL HIGH (ref 0.61–1.24)
GFR, Estimated: 9 mL/min — ABNORMAL LOW (ref >60)
Glucose, Bld: 90 mg/dL (ref 70–99)
Potassium: 3.7 mmol/L (ref 3.5–5.1)
Sodium: 140 mmol/L (ref 135–145)

## 2022-04-06 LAB — CBC
HCT: 24.6 % — ABNORMAL LOW (ref 39.0–52.0)
Hemoglobin: 8 g/dL — ABNORMAL LOW (ref 13.0–17.0)
MCH: 28.8 pg (ref 26.0–34.0)
MCHC: 32.5 g/dL (ref 30.0–36.0)
MCV: 88.5 fL (ref 80.0–100.0)
Platelets: 101 10*3/uL — ABNORMAL LOW (ref 150–400)
RBC: 2.78 MIL/uL — ABNORMAL LOW (ref 4.22–5.81)
RDW: 16.5 % — ABNORMAL HIGH (ref 11.5–15.5)
WBC: 6.5 10*3/uL (ref 4.0–10.5)
nRBC: 0 % (ref 0.0–0.2)

## 2022-04-06 LAB — GLUCOSE, CAPILLARY: Glucose-Capillary: 145 mg/dL — ABNORMAL HIGH (ref 70–99)

## 2022-04-06 LAB — BPAM RBC
Blood Product Expiration Date: 202309142359
Blood Product Expiration Date: 202309142359
ISSUE DATE / TIME: 202308160844
ISSUE DATE / TIME: 202308161715
Unit Type and Rh: 5100
Unit Type and Rh: 5100

## 2022-04-06 SURGERY — LAPAROSCOPIC REVISION CONTINUOUS AMBULATORY PERITONEAL DIALYSIS  (CAPD) CATHETER
Anesthesia: General | Site: Abdomen

## 2022-04-06 MED ORDER — SODIUM CHLORIDE 0.9 % IV SOLN: INTRAVENOUS | Status: DC | PRN

## 2022-04-06 MED ORDER — BUPIVACAINE-EPINEPHRINE 0.5% -1:200000 IJ SOLN
INTRAMUSCULAR | Status: DC | PRN
  Administered 2022-04-06: 27 mL

## 2022-04-06 MED ORDER — BUPIVACAINE-EPINEPHRINE (PF) 0.5% -1:200000 IJ SOLN
INTRAMUSCULAR | Status: AC
  Filled 2022-04-06: qty 30

## 2022-04-06 MED ORDER — HEPARIN SODIUM (PORCINE) 5000 UNIT/ML IJ SOLN
INTRAMUSCULAR | Status: AC
  Filled 2022-04-06: qty 1

## 2022-04-06 MED ORDER — PROPOFOL 10 MG/ML IV BOLUS
INTRAVENOUS | Status: DC | PRN
  Administered 2022-04-06: 150 mg via INTRAVENOUS

## 2022-04-06 MED ORDER — CEFAZOLIN SODIUM-DEXTROSE 2-4 GM/100ML-% IV SOLN
INTRAVENOUS | Status: AC
  Filled 2022-04-06: qty 100

## 2022-04-06 MED ORDER — LIDOCAINE HCL (CARDIAC) PF 100 MG/5ML IV SOSY
PREFILLED_SYRINGE | INTRAVENOUS | Status: DC | PRN
  Administered 2022-04-06: 60 mg via INTRAVENOUS

## 2022-04-06 MED ORDER — NEOSTIGMINE METHYLSULFATE 10 MG/10ML IV SOLN
INTRAVENOUS | Status: AC
  Filled 2022-04-06: qty 1

## 2022-04-06 MED ORDER — FENTANYL CITRATE (PF) 100 MCG/2ML IJ SOLN
INTRAMUSCULAR | Status: DC | PRN
  Administered 2022-04-06: 50 ug via INTRAVENOUS
  Administered 2022-04-06 (×2): 25 ug via INTRAVENOUS

## 2022-04-06 MED ORDER — OXYCODONE HCL 5 MG/5ML PO SOLN: 5.0000 mg | Freq: Once | ORAL | Status: DC | PRN

## 2022-04-06 MED ORDER — OXYCODONE HCL 5 MG PO TABS: 5.0000 mg | ORAL_TABLET | Freq: Once | ORAL | Status: DC | PRN

## 2022-04-06 MED ORDER — ACETAMINOPHEN 10 MG/ML IV SOLN
INTRAVENOUS | Status: AC
Start: 2022-04-06 — End: ?
  Filled 2022-04-06: qty 100

## 2022-04-06 MED ORDER — ACETAMINOPHEN 10 MG/ML IV SOLN
INTRAVENOUS | Status: DC | PRN
  Administered 2022-04-06: 1000 mg via INTRAVENOUS

## 2022-04-06 MED ORDER — NEOSTIGMINE METHYLSULFATE 10 MG/10ML IV SOLN
INTRAVENOUS | Status: DC | PRN
  Administered 2022-04-06: 5 mg via INTRAVENOUS

## 2022-04-06 MED ORDER — GLYCOPYRROLATE 0.2 MG/ML IJ SOLN
INTRAMUSCULAR | Status: DC | PRN
  Administered 2022-04-06: .2 mg via INTRAVENOUS
  Administered 2022-04-06: .8 mg via INTRAVENOUS

## 2022-04-06 MED ORDER — PHENYLEPHRINE HCL (PRESSORS) 10 MG/ML IV SOLN
INTRAVENOUS | Status: DC | PRN
  Administered 2022-04-06 (×2): 160 ug via INTRAVENOUS

## 2022-04-06 MED ORDER — GLYCOPYRROLATE 0.2 MG/ML IJ SOLN
INTRAMUSCULAR | Status: AC
Start: 2022-04-06 — End: ?
  Filled 2022-04-06: qty 4

## 2022-04-06 MED ORDER — MORPHINE SULFATE (PF) 2 MG/ML IV SOLN: 2.0000 mg | INTRAVENOUS | Status: DC | PRN

## 2022-04-06 MED ORDER — FENTANYL CITRATE (PF) 100 MCG/2ML IJ SOLN: 25.0000 ug | INTRAMUSCULAR | Status: DC | PRN

## 2022-04-06 MED ORDER — FENTANYL CITRATE (PF) 100 MCG/2ML IJ SOLN
INTRAMUSCULAR | Status: AC
  Filled 2022-04-06: qty 2

## 2022-04-06 MED ORDER — DEXAMETHASONE SODIUM PHOSPHATE 10 MG/ML IJ SOLN
INTRAMUSCULAR | Status: DC | PRN
  Administered 2022-04-06: 5 mg via INTRAVENOUS

## 2022-04-06 MED ORDER — ROCURONIUM BROMIDE 100 MG/10ML IV SOLN
INTRAVENOUS | Status: DC | PRN
  Administered 2022-04-06: 50 mg via INTRAVENOUS
  Administered 2022-04-06: 20 mg via INTRAVENOUS

## 2022-04-06 MED ORDER — OXYCODONE HCL 5 MG PO TABS: 5.0000 mg | ORAL_TABLET | ORAL | Status: DC | PRN

## 2022-04-06 MED ORDER — ONDANSETRON HCL 4 MG/2ML IJ SOLN
INTRAMUSCULAR | Status: DC | PRN
  Administered 2022-04-06: 4 mg via INTRAVENOUS

## 2022-04-06 SURGICAL SUPPLY — 47 items
ADAPTER CATH DIALYSIS 4X8 IT L (MISCELLANEOUS) IMPLANT
BIOPATCH WHT 1IN DISK W/4.0 H (GAUZE/BANDAGES/DRESSINGS) IMPLANT
BLADE SURG 11 STRL SS SAFETY (MISCELLANEOUS) ×1 IMPLANT
CATH EXTENDED DIALYSIS (CATHETERS) ×1 IMPLANT
DERMABOND ADVANCED (GAUZE/BANDAGES/DRESSINGS) ×1
DERMABOND ADVANCED .7 DNX12 (GAUZE/BANDAGES/DRESSINGS) ×1 IMPLANT
ELECT CAUTERY BLADE 6.4 (BLADE) ×1 IMPLANT
ELECT REM PT RETURN 9FT ADLT (ELECTROSURGICAL) ×1
ELECTRODE REM PT RTRN 9FT ADLT (ELECTROSURGICAL) ×1 IMPLANT
GLOVE SURG SYN 7.0 (GLOVE) ×1 IMPLANT
GLOVE SURG SYN 7.0 PF PI (GLOVE) ×1 IMPLANT
GLOVE SURG SYN 7.5  E (GLOVE) ×1
GLOVE SURG SYN 7.5 E (GLOVE) ×1 IMPLANT
GLOVE SURG SYN 7.5 PF PI (GLOVE) ×1 IMPLANT
GOWN STRL REUS W/ TWL LRG LVL3 (GOWN DISPOSABLE) ×2 IMPLANT
GOWN STRL REUS W/TWL LRG LVL3 (GOWN DISPOSABLE) ×2
IV NS 1000ML (IV SOLUTION) ×1
IV NS 1000ML BAXH (IV SOLUTION) ×1 IMPLANT
KIT TURNOVER KIT A (KITS) ×1 IMPLANT
LABEL OR SOLS (LABEL) ×1 IMPLANT
MANIFOLD NEPTUNE II (INSTRUMENTS) ×1 IMPLANT
MINICAP W/POVIDONE IODINE SOL (MISCELLANEOUS) ×1 IMPLANT
NDL INSUFFLATION 14GA 120MM (NEEDLE) ×1 IMPLANT
NEEDLE HYPO 22GX1.5 SAFETY (NEEDLE) ×1 IMPLANT
NEEDLE INSUFFLATION 14GA 120MM (NEEDLE) ×1 IMPLANT
NS IRRIG 500ML POUR BTL (IV SOLUTION) ×1 IMPLANT
PACK LAP CHOLECYSTECTOMY (MISCELLANEOUS) ×1 IMPLANT
SET CYSTO W/LG BORE CLAMP LF (SET/KITS/TRAYS/PACK) ×1 IMPLANT
SET TRANSFER 6 W/TWIST CLAMP 5 (SET/KITS/TRAYS/PACK) IMPLANT
SET TUBE SMOKE EVAC HIGH FLOW (TUBING) ×1 IMPLANT
SLEEVE ADV FIXATION 5X100MM (TROCAR) ×2 IMPLANT
SPONGE DRAIN TRACH 4X4 STRL 2S (GAUZE/BANDAGES/DRESSINGS) ×1 IMPLANT
STYLET FALLER (MISCELLANEOUS) IMPLANT
STYLET FALLER MEDIONICS (MISCELLANEOUS) IMPLANT
SUT ETHILON 2 0 FS 18 (SUTURE) ×1 IMPLANT
SUT MNCRL 4-0 (SUTURE) ×1
SUT MNCRL 4-0 27XMFL (SUTURE) ×1
SUT PROLENE 2 0 SH DA (SUTURE) IMPLANT
SUT VIC AB 3-0 SH 27 (SUTURE) ×1
SUT VIC AB 3-0 SH 27X BRD (SUTURE) ×1 IMPLANT
SUT VICRYL 0 AB UR-6 (SUTURE) ×1 IMPLANT
SUTURE MNCRL 4-0 27XMF (SUTURE) ×1 IMPLANT
SYS KII FIOS ACCESS ABD 5X100 (TROCAR) ×1
SYSTEM KII FIOS ACES ABD 5X100 (TROCAR) ×1 IMPLANT
TRAP FLUID SMOKE EVACUATOR (MISCELLANEOUS) ×1 IMPLANT
TROCAR KII BLADELESS 5X150 (TROCAR) ×1 IMPLANT
WATER STERILE IRR 500ML POUR (IV SOLUTION) ×1 IMPLANT

## 2022-04-06 NOTE — Transfer of Care (Signed)
Immediate Anesthesia Transfer of Care Note  Patient: [name] [name]  Procedure(s) Performed: LAPAROSCOPIC REVISION CONTINUOUS AMBULATORY PERITONEAL DIALYSIS  (CAPD) CATHETER, replacement (Abdomen)  Patient Location: PACU  Anesthesia Type:General  Level of Consciousness: awake, alert  and patient cooperative  Airway & Oxygen Therapy: Patient Spontanous Breathing  Post-op Assessment: Report given to RN and Post -op Vital signs reviewed and stable  Post vital signs: Reviewed and stable  Last Vitals:  Vitals Value Taken Time  BP 126/57 04/06/22 1824  Temp    Pulse 67 04/06/22 1825  Resp 16 04/06/22 1825  SpO2 100 % 04/06/22 1825    Last Pain:  Vitals:   04/06/22 1459  TempSrc: Oral  PainSc: 0-No pain         Complications: No notable events documented.

## 2022-04-06 NOTE — Op Note (Signed)
Procedure Date:  04/06/2022  Pre-operative Diagnosis:  Peritoneal dialysis catheter dysfunction  Post-operative Diagnosis:  Peritoneal dialysis catheter dysfunction  Procedure: Removal of peritoneal dialysis catheter Laparoscopic peritoneal dialysis catheter placement (new catheter)  Surgeon:  Melvyn Neth, MD  Anesthesia:  General endotracheal  Estimated Blood Loss:  15 ml  Specimens:  None  Complications:  None  Indications for Procedure:  This is a 81 y.o. male with a prior peritoneal dialysis catheter that has been malfunctioning.  It has been revised before and continues to have issues.  Discussed patient the goal of removing this catheter and placing a new one.  The risks of bleeding, abscess or infection, potential for an open procedure, catheter failure, and injury to surrounding structures were all discussed with the patient and he was willing to proceed.  Description of Procedure: The patient was correctly identified in the preoperative area and brought into the operating room.  The patient was placed supine with VTE prophylaxis in place.  Appropriate time-outs were performed.  Anesthesia was induced and the patient was intubated.  Appropriate antibiotics were infused.  The abdomen was prepped and draped in a sterile fashion.  Incision was made at the prior catheter insertion site and cautery was used to dissect down to the anterior fascia.  The catheter cuff was dissected free.  Then, at the counter-incision to tunnel the catheter, this was incised again and the catheter was pulled from the exit site through to the counter incision and then through the anterior fascia incision.  Cautery was used to free all the cuffs.  Once removed, the anterior sheath was closed using 0 Vicryl sutures.    A peritoneal dialysis catheter with extension tubing were brought into the field and measurements were made for appropriate placement and location of the catheter and components in the  patient's right abdominal wall.  An incision in the left upper quadrant was made and a Veress needle was introduced and pneumoperitoneum was established with appropriate opening pressures.  An additional incision was made in the left lower quadrant and Optiview technique was used to introduce a 5 mm laparoscopic port under visualization.  Then, an additional 5 mm port was placed at the location of the Veress needle.  There was no bowel injury doing this.  The abdomen was inspected and there were no noted issues to prevent placement of new catheter.  A 2.5 cm incision was made at the planned entry location overlying the right rectus muscle, and cautery was used to dissect down the subcutaneous tissue to reach the anterior rectus sheath.  The anterior sheath was incised.  Then, a long 5 mm port was introduced through the rectus sheath incision and pushed to the posterior sheath and tunneled inferiorly for about 5-7 cm and then pushed through the peritoneum.  The dialysis catheter was passed through the port and a laparoscopic grasper was used to place the coiled portion of the catheter in the pelvis.  The catheter was placed so that the first cuff was at the edge of the anterior rectus sheath.  At this point, pneumoperitoneum was released.  The rectus sheath was closed around the catheter cuff, and the catheter was then sized with the extension tubing for appropriate tunneling and exit in the right abdomen.  The catheter and extension tubing were connected using a titanium connector, and then the catheter was tunneled to the right upper quadrant via an additional incision and then tunneled inferiorly to it's planned exit site, about 4 cm  distal to the last cuff.  There were no complications doing this, and the catheter was maintained straight without kinks.  Then the connectors were placed and the catheter was tested for flow/drainage using heparinized saline.  It had good flow both in and out.  With the catheter in  place, 30 ml of local anesthetic was infiltrated onto the rectus sheath, and all incisions.  The incisions were closed in layers using 3-0 Vicryl and 4-0 Monocryl.  The wounds were cleaned and sealed with DermaBond.  A Biopatch was placed at the catheter exit site and the catheter was dressed with 4x4 gauze and porous tape.  The patient was emerged from anesthesia and extubated and brought to the recovery room for further management.  The patient tolerated the procedure well and all counts were correct at the end of the case.   Melvyn Neth, MD

## 2022-04-06 NOTE — Anesthesia Preprocedure Evaluation (Signed)
Anesthesia Evaluation  Patient identified by MRN, date of birth, ID band Patient awake    Reviewed: Allergy & Precautions, NPO status , Patient's Chart, lab work & pertinent test results  History of Anesthesia Complications Negative for: history of anesthetic complications  Airway Mallampati: III  TM Distance: >3 FB Neck ROM: full    Dental  (+) Chipped, Poor Dentition, Missing   Pulmonary neg shortness of breath, sleep apnea ,    Pulmonary exam normal        Cardiovascular Exercise Tolerance: Good hypertension, (-) anginaNormal cardiovascular exam     Neuro/Psych negative neurological ROS  negative psych ROS   GI/Hepatic Neg liver ROS, GERD  Controlled,  Endo/Other  diabetes, Type 2  Renal/GU DialysisRenal disease     Musculoskeletal   Abdominal   Peds  Hematology negative hematology ROS (+)   Anesthesia Other Findings Past Medical History: No date: Anemia No date: Arthritis No date: Cancer ([place]) No date: Chronic kidney disease     Comment:  Stage 4 CKD No date: GERD (gastroesophageal reflux disease)     Comment:  h/o No date: Gout No date: Hyperlipidemia, mixed No date: Hypertension No date: Sleep apnea     Comment:  h/o no cpap  Past Surgical History: No date: APPENDECTOMY No date: Bunion Removal; Right 11/23/2021: CAPD INSERTION; N/A     Comment:  Procedure: LAPAROSCOPIC INSERTION CONTINUOUS AMBULATORY               PERITONEAL DIALYSIS  (CAPD) CATHETER;  Surgeon: Olean Ree, MD;  Location: [facility] ORS;  Service: General;                Laterality: N/A; 01/31/2022: CAPD REVISION; N/A     Comment:  Procedure: LAPAROSCOPIC REVISION CONTINUOUS AMBULATORY               PERITONEAL DIALYSIS  (CAPD) CATHETER;  Surgeon: Olean Ree, MD;  Location: [facility] ORS;  Service: General;                Laterality: N/A; 06/14/2015: COLONOSCOPY WITH PROPOFOL; N/A     Comment:  Procedure:  COLONOSCOPY WITH PROPOFOL;  Surgeon: Lollie Sails, MD;  Location: [facility] ENDOSCOPY;  Service:               Endoscopy;  Laterality: N/A; 10/03/2021: DIALYSIS/PERMA CATHETER INSERTION; N/A     Comment:  Procedure: DIALYSIS/PERMA CATHETER INSERTION;  Surgeon:               Algernon Huxley, MD;  Location: [place] [facility];                Service: Cardiovascular;  Laterality: N/A; No date: EYE SURGERY No date: HERNIA REPAIR     Comment:  abd 11/23/2021: INSERTION OF MESH     Comment:  Procedure: INSERTION OF MESH;  Surgeon: Olean Ree,               MD;  Location: [place] [facility];  Service: General;; 03/27/2017: PROSTATE BIOPSY; N/A     Comment:  Procedure: PROSTATE BIOPSY-URO NAV;  Surgeon: Royston Cowper, MD;  Location: [facility] ORS;  Service: Urology;  Laterality: N/A; 09/11/2019: PROSTATE BIOPSY; N/A     Comment:  Procedure: PROSTATE BIOPSY Pearson Forster;  Surgeon:               Royston Cowper, MD;  Location: [facility] ORS;  Service:               Urology;  Laterality: N/A; No date: RETINAL DETACHMENT SURGERY; Left No date: TRANSURETHRAL RESECTION OF PROSTATE 11/23/2021: XI ROBOTIC ASSISTED VENTRAL HERNIA; N/A     Comment:  Procedure: XI ROBOTIC ASSISTED VENTRAL HERNIA,               incisional;  Surgeon: Olean Ree, MD;  Location: [facility]               ORS;  Service: General;  Laterality: N/A;  BMI    Body Mass Index: 27.32 kg/m      Reproductive/Obstetrics negative OB ROS                             Anesthesia Physical Anesthesia Plan  ASA: 3  Anesthesia Plan: General ETT   Post-op Pain Management:    Induction: Intravenous  PONV Risk Score and Plan: Ondansetron, Dexamethasone, Midazolam and Treatment may vary due to age or medical condition  Airway Management Planned: Oral ETT  Additional Equipment:   Intra-op Plan:   Post-operative Plan: Extubation in OR  Informed Consent: I have reviewed the patients  History and Physical, chart, labs and discussed the procedure including the risks, benefits and alternatives for the proposed anesthesia with the patient or authorized representative who has indicated his/her understanding and acceptance.     Dental Advisory Given  Plan Discussed with: Anesthesiologist, CRNA and Surgeon  Anesthesia Plan Comments: (Patient consented for risks of anesthesia including but not limited to:  - adverse reactions to medications - damage to eyes, teeth, lips or other oral mucosa - nerve damage due to positioning  - sore throat or hoarseness - Damage to heart, brain, nerves, lungs, other parts of body or loss of life  Patient voiced understanding.)        Anesthesia Quick Evaluation

## 2022-04-06 NOTE — Progress Notes (Signed)
Pt refused all medicine schedule tonight and was educated about its importance but still refuse it. Send another staff nurse to see if pt will take it but still refused. NP Randol Kern made aware. Will continue to monitor.

## 2022-04-06 NOTE — Progress Notes (Signed)
Progress Note   Patient: [name] [name] DPO:[id number] DOB: 09/08/1940 DOA: 04/05/2022     0 DOS: the patient was seen and examined on 04/06/2022   Brief hospital course: Taken from H&P.  [name] [name] is a 81 y.o. male with medical history significant of ESRD on dialysis, hypertension, gout and hyperlipidemia was came to the hospital to have his surgery for replacement of malfunctioning PD catheter and found to have hemoglobin of 6.5 which decreased to 6.1 on subsequent check.  Surgery got canceled and [facility] was requested to do admission for blood transfusion.   Patient denies any obvious bleeding.  He was getting 1 unit of PRBC at the time of interview.  No recent illnesses.  No chest pain, shortness of breath or orthopnea.  No upper respiratory symptoms.  No urinary symptoms.  No recent change in his appetite or bowel habits.   Patient was having trouble with his PD catheter for a while and was placed on hemodialysis for the past 1-1/2 months.  He would like to continue peritoneal dialysis so he can stay at home to take care of his sick wife.  8/17: Patient received total of 2 unit of PRBC.  Hemoglobin at 8 this morning.  Waiting for PD catheter replacement and most likely will be getting his dialysis before discharge.     Assessment and Plan: * Anemia Anemia panel consistent with anemia of chronic disease secondary to his end-stage renal disease. Hemoglobin improved to 8.0 after getting 2 unit of PRBC.  No sign of any obvious bleeding. -Monitor hemoglobin -Transfuse If below 8  PD catheter dysfunction [facility]) Patient will be going for his planned laparoscopic surgery for repositioning of his malfunctioning PD catheter today. -Care per surgery and nephrology.  ESRD on dialysis [facility]) Nephrology is aware.  Currently on hemodialysis for the past 1-1/2 months due to malfunctioning PD catheter. -Current continue his scheduled dialysis -Continue home bicarb and Sensipar  Benign  essential hypertension Blood pressure within goal. On multiple medications at home which include amlodipine 10 mg, enalapril 20 mg twice daily, hydralazine 100 mg 3 times daily, metoprolol 50 mg twice daily. -Continue home meds  Mixed hyperlipidemia - Continue with statin   Subjective: Patient was seen and examined today.  No new complaints.  Waiting for his procedure and hoping to go home soon.  Physical Exam: Vitals:   04/05/22 2036 04/05/22 2150 04/06/22 0345 04/06/22 0432  BP: (!) 117/54  123/68 131/61  Pulse: (!) 58 (!) 55 64 (!) 59  Resp: 16   17  Temp: 98.1 F (36.7 C)   98.1 F (36.7 C)  TempSrc: Oral   Oral  SpO2: 100%  100% 99%  Weight:      Height:       General.  Well-developed gentleman, in no acute distress. Pulmonary.  Lungs clear bilaterally, normal respiratory effort. CV.  Regular rate and rhythm, no JVD, rub or murmur. Abdomen.  Soft, nontender, nondistended, BS positive. CNS.  Alert and oriented .  No focal neurologic deficit. Extremities.  No edema, no cyanosis, pulses intact and symmetrical. Psychiatry.  Judgment and insight appears normal.  Data Reviewed: Prior data reviewed.  Family Communication:   Disposition: Status is: Observation The patient remains OBS appropriate and will d/c before 2 midnights.  Planned Discharge Destination: Home  Time spent: 42 minutes  This record has been created using Systems analyst. Errors have been sought and corrected,but may not always be located. Such creation errors do not reflect on  the standard of care.  Author: Lorella Nimrod, MD 04/06/2022 2:24 PM  For on call review [email or web address].

## 2022-04-06 NOTE — Anesthesia Postprocedure Evaluation (Signed)
Anesthesia Post Note  Patient: [name] [name]  Procedure(s) Performed: LAPAROSCOPIC REVISION CONTINUOUS AMBULATORY PERITONEAL DIALYSIS  (CAPD) CATHETER, replacement (Abdomen)  Patient location during evaluation: PACU Anesthesia Type: General Level of consciousness: awake and alert Pain management: pain level controlled Vital Signs Assessment: post-procedure vital signs reviewed and stable Respiratory status: spontaneous breathing, nonlabored ventilation, respiratory function stable and patient connected to nasal cannula oxygen Cardiovascular status: blood pressure returned to baseline and stable Postop Assessment: no apparent nausea or vomiting Anesthetic complications: no   No notable events documented.   Last Vitals:  Vitals:   04/06/22 1830 04/06/22 1845  BP: (!) 134/54 128/61  Pulse: (!) 55 63  Resp: 16 16  Temp:    SpO2: 100% 100%    Last Pain:  Vitals:   04/06/22 1845  TempSrc:   PainSc: 0-No pain                 Arita Miss

## 2022-04-06 NOTE — Anesthesia Procedure Notes (Signed)
Procedure Name: Intubation Date/Time: 04/06/2022 3:53 PM  Performed by: Gayland Curry, CRNAPre-anesthesia Checklist: Patient identified, Emergency Drugs available, Suction available and Patient being monitored Patient Re-evaluated:Patient Re-evaluated prior to induction Oxygen Delivery Method: Circle system utilized Preoxygenation: Pre-oxygenation with 100% oxygen Induction Type: IV induction Ventilation: Mask ventilation without difficulty and Oral airway inserted - appropriate to patient size Laryngoscope Size: McGraph and 4 Tube type: Oral Tube size: 7.0 mm Number of attempts: 1 Airway Equipment and Method: Stylet Placement Confirmation: ETT inserted through vocal cords under direct vision, positive ETCO2 and breath sounds checked- equal and bilateral Secured at: 22 cm Tube secured with: Tape Dental Injury: Teeth and Oropharynx as per pre-operative assessment

## 2022-04-06 NOTE — Progress Notes (Signed)
04/06/2022  Subjective: Patient had to be admitted due to low hgb and need to transfuse.  Received 2 units pRBC and today Hgb is 8.0.  Denies any abdominal pain, nausea, or vomiting.  Ready for surgery today.  Vital signs: Temp:  [97.5 F (36.4 C)-98.1 F (36.7 C)] 98 F (36.7 C) (08/17 1459) Pulse Rate:  [49-64] 50 (08/17 1459) Resp:  [15-19] 17 (08/17 0432) BP: (117-136)/(54-74) 136/58 (08/17 1459) SpO2:  [99 %-100 %] 99 % (08/17 1459)   Intake/Output: 08/16 0701 - 08/17 0700 In: 912 [P.O.:240; Blood:672] Out: -  Last BM Date : 04/03/22  Physical Exam: Constitutional:  No acute distress Abdomen:  soft, non-distended, non-tender.  PD catheter in place coming from left side.  Labs:  Recent Labs    04/04/22 1106 04/05/22 0730 04/05/22 1241 04/06/22 0522  WBC 5.8  --   --  6.5  HGB 6.5*   < > 7.8* 8.0*  HCT 20.2*   < > 23.2* 24.6*  PLT 124*  --   --  101*   < > = values in this interval not displayed.   Recent Labs    04/04/22 1106 04/05/22 0730 04/06/22 0522  NA 142 143 140  K 3.2* 3.5 3.7  CL 106 104 109  CO2 27  --  25  GLUCOSE 96 108* 90  BUN 21 25* 36*  CREATININE 4.21* 6.20* 6.09*  CALCIUM 8.5*  --  8.0*   No results for input(s): "LABPROT", "INR" in the last 72 hours.  Imaging: No results found.  Assessment/Plan: This is a 81 y.o. male with malfunctioning PD catheter.  --Patient's Hgb is better and responded appropriately after transfusion.  He's ready for surgery today. --Will proceed today for lap assisted PD catheter revision, likely replacement, coming from right side.  Discussed procedure with patient and he's willing to proceed.   I spent 35 minutes dedicated to the care of this patient on the date of this encounter to include pre-visit review of records, face-to-face time with the patient discussing diagnosis and management, and any post-visit coordination of care.  Melvyn Neth, [facility]

## 2022-04-06 NOTE — Plan of Care (Signed)
  Problem: Clinical Measurements: Goal: Ability to maintain clinical measurements within normal limits will improve Outcome: Progressing   Problem: Clinical Measurements: Goal: Will remain free from infection Outcome: Progressing   Problem: Clinical Measurements: Goal: Diagnostic test results will improve Outcome: Progressing   

## 2022-04-06 NOTE — Hospital Course (Signed)
Taken from H&P.  [name] [name] is a 81 y.o. male with medical history significant of ESRD on dialysis, hypertension, gout and hyperlipidemia was came to the hospital to have his surgery for replacement of malfunctioning PD catheter and found to have hemoglobin of 6.5 which decreased to 6.1 on subsequent check.  Surgery got canceled and TRH was requested to do admission for blood transfusion.   Patient denies any obvious bleeding.  He was getting 1 unit of PRBC at the time of interview.  No recent illnesses.  No chest pain, shortness of breath or orthopnea.  No upper respiratory symptoms.  No urinary symptoms.  No recent change in his appetite or bowel habits.   Patient was having trouble with his PD catheter for a while and was placed on hemodialysis for the past 1-1/2 months.  He would like to continue peritoneal dialysis so he can stay at home to take care of his sick wife.  8/17: Patient received total of 2 unit of PRBC.  Hemoglobin at 8 this morning.  Waiting for PD catheter replacement and most likely will be getting his dialysis before discharge.

## 2022-04-07 ENCOUNTER — Encounter: Payer: Self-pay | Admitting: Surgery

## 2022-04-07 DIAGNOSIS — N184 Chronic kidney disease, stage 4 (severe): Secondary | ICD-10-CM | POA: Diagnosis not present

## 2022-04-07 DIAGNOSIS — I10 Essential (primary) hypertension: Secondary | ICD-10-CM | POA: Diagnosis not present

## 2022-04-07 DIAGNOSIS — E1122 Type 2 diabetes mellitus with diabetic chronic kidney disease: Secondary | ICD-10-CM

## 2022-04-07 DIAGNOSIS — N185 Chronic kidney disease, stage 5: Secondary | ICD-10-CM | POA: Diagnosis not present

## 2022-04-07 DIAGNOSIS — T85611A Breakdown (mechanical) of intraperitoneal dialysis catheter, initial encounter: Secondary | ICD-10-CM

## 2022-04-07 DIAGNOSIS — N186 End stage renal disease: Secondary | ICD-10-CM | POA: Diagnosis not present

## 2022-04-07 LAB — COMPREHENSIVE METABOLIC PANEL
ALT: 8 U/L (ref 0–44)
AST: 13 U/L — ABNORMAL LOW (ref 15–41)
Albumin: 3.2 g/dL — ABNORMAL LOW (ref 3.5–5.0)
Alkaline Phosphatase: 79 U/L (ref 38–126)
Anion gap: 9 (ref 5–15)
BUN: 43 mg/dL — ABNORMAL HIGH (ref 8–23)
CO2: 21 mmol/L — ABNORMAL LOW (ref 22–32)
Calcium: 8.9 mg/dL (ref 8.9–10.3)
Chloride: 111 mmol/L (ref 98–111)
Creatinine, Ser: 7.01 mg/dL — ABNORMAL HIGH (ref 0.61–1.24)
GFR, Estimated: 7 mL/min — ABNORMAL LOW (ref >60)
Glucose, Bld: 130 mg/dL — ABNORMAL HIGH (ref 70–99)
Potassium: 4.3 mmol/L (ref 3.5–5.1)
Sodium: 141 mmol/L (ref 135–145)
Total Bilirubin: 0.4 mg/dL (ref 0.3–1.2)
Total Protein: 6.5 g/dL (ref 6.5–8.1)

## 2022-04-07 LAB — CBC WITH DIFFERENTIAL/PLATELET
Abs Immature Granulocytes: 0.02 10*3/uL (ref 0.00–0.07)
Basophils Absolute: 0 10*3/uL (ref 0.0–0.1)
Basophils Relative: 0 %
Eosinophils Absolute: 0 10*3/uL (ref 0.0–0.5)
Eosinophils Relative: 0 %
HCT: 27.2 % — ABNORMAL LOW (ref 39.0–52.0)
Hemoglobin: 9 g/dL — ABNORMAL LOW (ref 13.0–17.0)
Immature Granulocytes: 0 %
Lymphocytes Relative: 15 %
Lymphs Abs: 1.2 10*3/uL (ref 0.7–4.0)
MCH: 29.2 pg (ref 26.0–34.0)
MCHC: 33.1 g/dL (ref 30.0–36.0)
MCV: 88.3 fL (ref 80.0–100.0)
Monocytes Absolute: 0.5 10*3/uL (ref 0.1–1.0)
Monocytes Relative: 7 %
Neutro Abs: 6.5 10*3/uL (ref 1.7–7.7)
Neutrophils Relative %: 78 %
Platelets: 120 10*3/uL — ABNORMAL LOW (ref 150–400)
RBC: 3.08 MIL/uL — ABNORMAL LOW (ref 4.22–5.81)
RDW: 16.3 % — ABNORMAL HIGH (ref 11.5–15.5)
WBC: 8.2 10*3/uL (ref 4.0–10.5)
nRBC: 0 % (ref 0.0–0.2)

## 2022-04-07 LAB — HEPATITIS B SURFACE ANTIGEN: Hepatitis B Surface Ag: NONREACTIVE

## 2022-04-07 LAB — PHOSPHORUS: Phosphorus: 5.4 mg/dL — ABNORMAL HIGH (ref 2.5–4.6)

## 2022-04-07 LAB — HEPATITIS B CORE ANTIBODY, TOTAL: Hep B Core Total Ab: NONREACTIVE

## 2022-04-07 LAB — MAGNESIUM: Magnesium: 2 mg/dL (ref 1.7–2.4)

## 2022-04-07 LAB — HEPATITIS B SURFACE ANTIBODY,QUALITATIVE: Hep B S Ab: NONREACTIVE

## 2022-04-07 LAB — HEPATITIS C ANTIBODY: HCV Ab: NONREACTIVE

## 2022-04-07 NOTE — Progress Notes (Signed)
[facility]  ROUNDING NOTE   Subjective:   [name] [name] is a 81 year old male with past medical history including gout, hyperlipidemia, hypertension, and end-stage renal disease on hemodialysis.  Patient presents to the hospital for scheduled surgery to evaluate and possibly replace malfunctioning PD catheter.  Patient has been admitted under observation for Anemia [D64.9]  Patient is known to our practice and currently receives outpatient hemodialysis treatments at [facility] on a MWF schedule, supervised by Dr. Holley Raring.  Outpatient plan is to transition patient to peritoneal dialysis however have experienced numerous obstacles with PD cath.  Patient would like to continue to try form successful peritoneal dialysis due to a sick wife he cares for at home.  This is currently the third revision to catheter.  Successful procedure performed yesterday to replace PD catheter however decreased hemoglobin caused patient to become admitted overnight.  Currently denies nausea or vomiting.  Currently denies pain or discomfort.  Reports mild weakness but denies dizziness.  Hemoglobin yesterday morning 6.1, received 1 unit blood transfusion.  Hemoglobin recovered to 7.8.  Patient received additional unit of blood in preparation for surgery.   Objective:  Vital signs in last 24 hours:  Temp:  [97.2 F (36.2 C)-98.5 F (36.9 C)] 98.5 F (36.9 C) (08/18 1333) Pulse Rate:  [55-73] 61 (08/18 1500) Resp:  [12-23] 12 (08/18 1500) BP: (124-149)/(54-71) 128/60 (08/18 1500) SpO2:  [97 %-100 %] 100 % (08/18 1500) Weight:  [83.3 kg] 83.3 kg (08/18 1337)  Weight change:  Filed Weights   04/05/22 0717 04/07/22 1337  Weight: 83.9 kg 83.3 kg    Intake/Output: I/O last 3 completed shifts: In: 352 [Blood:352] Out: 285 [Urine:280; Blood:5]   Intake/Output this shift:  Total I/O In: 73 [P.O.:780] Out: -   Physical Exam: General: NAD, resting comfortably  Head: Normocephalic, atraumatic.  Moist oral mucosal membranes  Eyes: Anicteric  Lungs:  Clear to auscultation, normal effort  Heart: Regular rate and rhythm sinus bradycardia  Abdomen:  Soft, nontender, lap sites, PD catheter  Extremities: Trace peripheral edema.  Neurologic: Nonfocal, moving all four extremities  Skin: No lesions  Access: Right chest PermCath PD catheter    Basic Metabolic Panel: Recent Labs  Lab 04/04/22 1106 04/05/22 0730 04/06/22 0522 04/07/22 0734  NA 142 143 140 141  K 3.2* 3.5 3.7 4.3  CL 106 104 109 111  CO2 27  --  25 21*  GLUCOSE 96 108* 90 130*  BUN 21 25* 36* 43*  CREATININE 4.21* 6.20* 6.09* 7.01*  CALCIUM 8.5*  --  8.0* 8.9  MG  --   --   --  2.0  PHOS  --   --   --  5.4*    Liver Function Tests: Recent Labs  Lab 04/07/22 0734  AST 13*  ALT 8  ALKPHOS 79  BILITOT 0.4  PROT 6.5  ALBUMIN 3.2*   No results for input(s): "LIPASE", "AMYLASE" in the last 168 hours. No results for input(s): "AMMONIA" in the last 168 hours.  CBC: Recent Labs  Lab 04/04/22 1106 04/05/22 0730 04/05/22 1241 04/06/22 0522 04/07/22 0734  WBC 5.8  --   --  6.5 8.2  NEUTROABS  --   --   --   --  6.5  HGB 6.5* 6.1* 7.8* 8.0* 9.0*  HCT 20.2* 18.0* 23.2* 24.6* 27.2*  MCV 88.6  --   --  88.5 88.3  PLT 124*  --   --  101* 120*    Cardiac Enzymes:  No results for input(s): "CKTOTAL", "CKMB", "CKMBINDEX", "TROPONINI" in the last 168 hours.  BNP: Invalid input(s): "POCBNP"  CBG: Recent Labs  Lab 04/06/22 2202  GLUCAP 145*    Microbiology: Results for orders placed or performed during the hospital encounter of 11/27/21  Urine Culture     Status: None   Collection Time: 11/27/21  2:45 PM   Specimen: Urine, Random  Result Value Ref Range Status   Specimen Description   Final    URINE, RANDOM Performed at [facility], [address] [place], [place] [address]    Special Requests   Final    NONE Performed at [facility], [address], [place], [place]  [address]    Culture   Final    NO GROWTH Performed at [facility], [place] [address], [place], [place] [address]    Report Status 11/29/2021 FINAL  Final    Coagulation Studies: No results for input(s): "LABPROT", "INR" in the last 72 hours.  Urinalysis: No results for input(s): "COLORURINE", "LABSPEC", "PHURINE", "GLUCOSEU", "HGBUR", "BILIRUBINUR", "KETONESUR", "PROTEINUR", "UROBILINOGEN", "NITRITE", "LEUKOCYTESUR" in the last 72 hours.  Invalid input(s): "APPERANCEUR"    Imaging: No results found.   Medications:    sodium chloride      sodium chloride   Intravenous Once   amLODipine  10 mg Oral BH-q7a   bicalutamide  50 mg Oral BH-q7a   Chlorhexidine Gluconate Cloth  6 each Topical Daily   cinacalcet  30 mg Oral Daily   enalapril  20 mg Oral BID   febuxostat  40 mg Oral BH-q7a   hydrALAZINE  100 mg Oral TID   metoprolol tartrate  50 mg Oral BID   pravastatin  20 mg Oral q1800   sodium bicarbonate  650 mg Oral BID   sodium chloride flush  3 mL Intravenous Q12H   sodium chloride, acetaminophen **OR** acetaminophen, fluticasone, morphine injection, ondansetron **OR** ondansetron (ZOFRAN) IV, oxyCODONE, polyethylene glycol, sodium chloride flush  Assessment/ Plan:  [name] is a 81 y.o.  male with past medical history including gout, hyperlipidemia, hypertension, and end-stage renal disease on hemodialysis.  Patient presents to the hospital for scheduled surgery to evaluate and possibly replace malfunctioning PD catheter.  Patient has been admitted under observation for Anemia [D64.9]  [facility]/MWF/right chest PermCath  Anemia of chronic kidney disease Lab Results  Component Value Date   HGB 9.0 (L) 04/07/2022   Hemoglobin on arrival 6.1, patient received 1 unit blood transfusion.  Patient received additional unit prior to surgery.  Current hemoglobin 9.0.  We will continue to monitor.  Patient receives [place] outpatient.  2.  End-stage renal  disease on hemodialysis.  Currently transitioning to peritoneal dialysis with catheter replacement completed on 04/06/2022.Marland Kitchen  Scheduled to receive dialysis today, UF goal 1.5 L as tolerated.  We will continue to transition to peritoneal dialysis outpatient.  3. Secondary Hyperparathyroidism: with outpatient labs: PTH 271, phosphorus 5.3, calcium 8.0 on 03/02/2022.   Lab Results  Component Value Date   CALCIUM 8.9 04/07/2022   CAION 1.11 (L) 04/05/2022   PHOS 5.4 (H) 04/07/2022    Calcium and phosphorus within acceptable range.  We will continue to monitor.  4.  Hypertension with chronic kidney disease.  Home regimen includes amlodipine, enalapril, hydralazine, and metoprolol.  Currently receiving these medications.  Blood pressure well controlled, 133/61.  5.  Acute metabolic acidosis.  Continue sodium bicarb 650 mg twice daily.   LOS: 0   8/18/20233:02 PM

## 2022-04-07 NOTE — Plan of Care (Signed)
  Problem: Clinical Measurements: Goal: Ability to maintain clinical measurements within normal limits will improve Outcome: Progressing   Problem: Clinical Measurements: Goal: Respiratory complications will improve Outcome: Progressing   Problem: Health Behavior/Discharge Planning: Goal: Ability to manage health-related needs will improve Outcome: Progressing   Problem: Activity: Goal: Risk for activity intolerance will decrease Outcome: Progressing   Problem: Pain Managment: Goal: General experience of comfort will improve Outcome: Progressing   Problem: Safety: Goal: Ability to remain free from injury will improve Outcome: Progressing

## 2022-04-07 NOTE — Progress Notes (Signed)
PROGRESS NOTE    [name] [name]  WFU:[id number] DOB: 03-15-1941 DOA: 04/05/2022 PCP: Dion Body, MD     Brief Narrative:  81 y.o. BM PMHx ESRD on HD , HTN, HLD, gout  Admitted hospital to have his surgery for replacement of malfunctioning PD catheter and found to have hemoglobin of 6.5 which decreased to 6.1 on subsequent check.  Surgery got canceled and TRH was requested to do admission for blood transfusion.   Patient denies any obvious bleeding.  He was getting 1 unit of PRBC at the time of interview.  No recent illnesses.  No chest pain, shortness of breath or orthopnea.  No upper respiratory symptoms.  No urinary symptoms.  No recent change in his appetite or bowel habits.   Patient was having trouble with his PD catheter for a while and was placed on HD for the past 1-1/2 months.  He would like to continue peritoneal dialysis so he can stay at home to take care of his sick wife.   8/17: Patient received total of 2 unit of PRBC.  Hemoglobin at 8 this morning.  Waiting for PD catheter replacement and most likely will be getting his dialysis before discharge.     Subjective: 8/18 A/O x4.  S/p removal and replacement of peritoneal dialysis catheter.  S/p HD.   Assessment & Plan: Covid vaccination;   Principal Problem:   Anemia Active Problems:   PD catheter dysfunction ([place])   ESRD on dialysis ([place])   Benign essential hypertension   Mixed hyperlipidemia  Anemia -Anemia panel consistent with anemia of chronic disease secondary to his end-stage renal disease. -Hemoglobin improved to 8.0 after getting 2 unit of PRBC.  No sign of any obvious bleeding. -Monitor hemoglobin -Transfuse If below 8   PD catheter dysfunction [facility]) Patient will be going for his planned laparoscopic surgery for repositioning of his malfunctioning PD catheter today. -Care per surgery and nephrology.   ESRD on dialysis [facility]) (HD on M/W/F) -Currently on HD for the past 1-1/2 months due to  malfunctioning PD catheter. -8/18 discussed case with Dr. Carlota Raspberry Nephrology states patient had tried PD HD, malfunction with PD catheter.  On HD for 1.5 months .-8/18 discussed case with RN Jinny Blossom and she verifies patient has HD on M/W/F scheduled at the [place] center.  Nephrology will attempt to transition back to PD once newly replaced catheter has healed.  Okay to discharge. -Continue home bicarb and Sensipar   Benign essential hypertension -Blood pressure within goal. -Amlodipine 10 mg -Enalapril 20 mg twice daily -Hydralazine 100 mg 3 times daily -Metoprolol 50 mg twice daily.   Mixed hyperlipidemia - Continue with statin        Mobility Assessment (last 72 hours)     Mobility Assessment     Row Name 04/05/22 2100 04/05/22 1600         Does patient have an order for bedrest or is patient medically unstable No - Continue assessment No - Continue assessment      What is the highest level of mobility based on the progressive mobility assessment? Level 4 (Walks with assist in room) - Balance while marching in place and cannot step forward and back - Complete Level 5 (Walks with assist in room/hall) - Balance while stepping forward/back and can walk in room with assist - Complete                  DVT prophylaxis: SCD Code Status: Full Family Communication: 8/18 discussed case  with daughter all questions answered Status is: Inpatient    Dispo: The patient is from: Home              Anticipated d/c is to: Home              Anticipated d/c date is: 1 day              Patient currently is medically stable to d/c.      Consultants:  Nephrology   Procedures/Significant Events:  8/17 Removal of peritoneal dialysis catheter.Laparoscopic peritoneal dialysis catheter placement (new catheter)    I have personally reviewed and interpreted all radiology studies and my findings are as above.  VENTILATOR  SETTINGS:    Cultures   Antimicrobials:    Devices    LINES / TUBES:      Continuous Infusions:  sodium chloride       Objective: Vitals:   04/06/22 1845 04/06/22 2011 04/06/22 2047 04/07/22 0444  BP: 128/61 134/66 (!) 149/71 131/71  Pulse: 63 65 66 73  Resp: '16 18 16 20  '$ Temp:  97.6 F (36.4 C) 98 F (36.7 C) 97.9 F (36.6 C)  TempSrc:  Oral  Oral  SpO2: 100% 100% 99% 100%  Weight:      Height:        Intake/Output Summary (Last 24 hours) at 04/07/2022 0703 Last data filed at 04/06/2022 1807 Gross per 24 hour  Intake --  Output 285 ml  Net -285 ml   Filed Weights   04/05/22 0717  Weight: 83.9 kg    Examination:  General:/O x4 No acute respiratory distress Eyes: negative scleral hemorrhage, negative anisocoria, negative icterus ENT: Negative Runny nose, negative gingival bleeding, Neck:  Negative scars, masses, torticollis, lymphadenopathy, JVD Lungs: Clear to auscultation bilaterally without wheezes or crackles Cardiovascular: Regular rate and rhythm without murmur gallop or rub normal S1 and S2 Abdomen: negative abdominal pain, nondistended, positive soft, bowel sounds, no rebound, no ascites, no appreciable mass Extremities: No significant cyanosis, clubbing, or edema bilateral lower extremities Skin: Negative rashes, lesions, ulcers Psychiatric:  Negative depression, negative anxiety, negative fatigue, negative mania  Central nervous system:  Cranial nerves II through XII intact, tongue/uvula midline, all extremities muscle strength 5/5, sensation intact throughout, negative dysarthria, negative expressive aphasia, negative receptive aphasia.  .     Data Reviewed: Care during the described time interval was provided by me .  I have reviewed this patient's available data, including medical history, events of note, physical examination, and all test results as part of my evaluation.  CBC: Recent Labs  Lab 04/04/22 1106 04/05/22 0730  04/05/22 1241 04/06/22 0522  WBC 5.8  --   --  6.5  HGB 6.5* 6.1* 7.8* 8.0*  HCT 20.2* 18.0* 23.2* 24.6*  MCV 88.6  --   --  88.5  PLT 124*  --   --  709*   Basic Metabolic Panel: Recent Labs  Lab 04/04/22 1106 04/05/22 0730 04/06/22 0522  NA 142 143 140  K 3.2* 3.5 3.7  CL 106 104 109  CO2 27  --  25  GLUCOSE 96 108* 90  BUN 21 25* 36*  CREATININE 4.21* 6.20* 6.09*  CALCIUM 8.5*  --  8.0*   GFR: Estimated Creatinine Clearance: 9.7 mL/min (A) (by C-G formula based on SCr of 6.09 mg/dL (H)). Liver Function Tests: No results for input(s): "AST", "ALT", "ALKPHOS", "BILITOT", "PROT", "ALBUMIN" in the last 168 hours. No results for input(s): "LIPASE", "AMYLASE" in the  last 168 hours. No results for input(s): "AMMONIA" in the last 168 hours. Coagulation Profile: No results for input(s): "INR", "PROTIME" in the last 168 hours. Cardiac Enzymes: No results for input(s): "CKTOTAL", "CKMB", "CKMBINDEX", "TROPONINI" in the last 168 hours. BNP (last 3 results) No results for input(s): "PROBNP" in the last 8760 hours. HbA1C: No results for input(s): "HGBA1C" in the last 72 hours. CBG: Recent Labs  Lab 04/06/22 2202  GLUCAP 145*   Lipid Profile: No results for input(s): "CHOL", "HDL", "LDLCALC", "TRIG", "CHOLHDL", "LDLDIRECT" in the last 72 hours. Thyroid Function Tests: No results for input(s): "TSH", "T4TOTAL", "FREET4", "T3FREE", "THYROIDAB" in the last 72 hours. Anemia Panel: Recent Labs    04/05/22 1345  VITAMINB12 224  FOLATE 11.7  FERRITIN 535*  TIBC 225*  IRON 65  RETICCTPCT 0.9   Sepsis Labs: No results for input(s): "PROCALCITON", "LATICACIDVEN" in the last 168 hours.  No results found for this or any previous visit (from the past 240 hour(s)).       Radiology Studies: No results found.      Scheduled Meds:  sodium chloride   Intravenous Once   amLODipine  10 mg Oral BH-q7a   bicalutamide  50 mg Oral BH-q7a   Chlorhexidine Gluconate Cloth  6  each Topical Daily   cinacalcet  30 mg Oral Daily   enalapril  20 mg Oral BID   febuxostat  40 mg Oral BH-q7a   hydrALAZINE  100 mg Oral TID   metoprolol tartrate  50 mg Oral BID   pravastatin  20 mg Oral q1800   sodium bicarbonate  650 mg Oral BID   sodium chloride flush  3 mL Intravenous Q12H   Continuous Infusions:  sodium chloride       LOS: 0 days    Time spent:40 min    Arleene Settle, Geraldo Docker, MD [facility]   If 7PM-7AM, please contact night-coverage 04/07/2022, 7:03 AM

## 2022-04-07 NOTE — Progress Notes (Signed)
Hd tx of 3.5hrs completed. 73.3L total vol processed. No complications. Report given to [employer], rn. Total uf removed: 1519m Post hd wt: 81.8kgs Post hd v/s: 98.6 132/65(86) 67 18 99%

## 2022-04-07 NOTE — Care Management Obs Status (Signed)
[facility] NOTIFICATION   Patient Details  Name: [name] [name] MRN: [id number] Date of Birth: 02/01/1941   Medicare Observation Status Notification Given:  Yes  Reviewed with patient at bedside. Copy provided to patient for patient's records.  [place], LCSW 04/07/2022, 10:19 AM

## 2022-04-07 NOTE — Plan of Care (Signed)

## 2022-04-07 NOTE — Progress Notes (Signed)
[name] Day(s): 0.   Post op day(s): 1 Day Post-Op.   Interval History:  Patient seen and examined No acute events or new complaints overnight.  Patient reports he had some confusion overnight but this is resolved Abdominal soreness No fever, chills, nausea, emesis Remains without leukocytosis; 8.2K Hgb improving; 9.0 BMP consistent with ESRD; plan for dialysis today No significant electrolyte derangements On CLD; tolerating   Vital signs in last 24 hours: [min-max] current  Temp:  [97.2 F (36.2 C)-98 F (36.7 C)] 98 F (36.7 C) (08/18 0818) Pulse Rate:  [50-73] 69 (08/18 0818) Resp:  [12-23] 18 (08/18 0818) BP: (124-149)/(54-71) 124/61 (08/18 0818) SpO2:  [97 %-100 %] 100 % (08/18 0818)     Height: '5\' 9"'$  (175.3 cm) Weight: 83.9 kg BMI (Calculated): 27.31   Intake/Output last 2 shifts:  08/17 0701 - 08/18 0700 In: -  Out: 285 [Urine:280; Blood:5]   Physical Exam:  Constitutional: alert, cooperative and no distress  Respiratory: breathing non-labored at rest  Cardiovascular: regular rate and sinus rhythm  Gastrointestinal: soft, expected incisional soreness, non-distended, no rebound/guarding. PD catheter in right abdomen; site CDI.  Integumentary: Laparoscopic incisions are CDI with dermabond, no erythema or drainage   Labs:     Latest Ref Rng & Units 04/07/2022    7:34 AM 04/06/2022    5:22 AM 04/05/2022   12:41 PM  CBC  WBC 4.0 - 10.5 K/uL 8.2  6.5    Hemoglobin 13.0 - 17.0 g/dL 9.0  8.0  7.8   Hematocrit 39.0 - 52.0 % 27.2  24.6  23.2   Platelets 150 - 400 K/uL 120  101        Latest Ref Rng & Units 04/07/2022    7:34 AM 04/06/2022    5:22 AM 04/05/2022    7:30 AM  CMP  Glucose 70 - 99 mg/dL 130  90  108   BUN 8 - 23 mg/dL 43  36  25   Creatinine 0.61 - 1.24 mg/dL 7.01  6.09  6.20   Sodium 135 - 145 mmol/L 141  140  143   Potassium 3.5 - 5.1 mmol/L 4.3  3.7  3.5   Chloride 98 - 111 mmol/L 111  109  104    CO2 22 - 32 mmol/L 21  25    Calcium 8.9 - 10.3 mg/dL 8.9  8.0    Total Protein 6.5 - 8.1 g/dL 6.5     Total Bilirubin 0.3 - 1.2 mg/dL 0.4     Alkaline Phos 38 - 126 U/L 79     AST 15 - 41 U/L 13     ALT 0 - 44 U/L 8        Imaging studies: No new pertinent imaging studies   Assessment/Plan:  81 y.o. male 1 Day Post-Op s/p removal and replacement of peritoneal dialysis catheter.   - Okay to advance diet from surgical perspective  - Pain control prn  - Doing well; nothing further to add from surgical perspective at this time.   - Discharge Planning; Patient to undergo dialysis before DC. Okay to DC from surgical perspective. Will place follow up for 2 weeks.   All of the above findings and recommendations were discussed with the patient, and the medical team, and all of patient's questions were answered to his expressed satisfaction.  -- Edison Simon, PA-C [facility] 04/07/2022, 8:49 AM M-F: 7am - 4pm

## 2022-04-07 NOTE — Progress Notes (Signed)
Mobility Specialist - Progress Note   04/07/22 1200  Mobility  Activity Ambulated with assistance in room;Transferred to/from [facility]  Level of Assistance Modified independent, requires aide device or extra time  Assistive Device None  Distance Ambulated (ft) 2 ft  Activity Response Tolerated well  $Mobility charge 1 Mobility     Pt lying in bed upon arrival, utilizing RA. Pt reports soreness in lower abdomen. Requesting assistance to use restroom---declined ambulation to bathroom d/t immediate need. During attempt to come EOB, pt begins to urinate resulting in soiled sheets. Deferred BSC transfer and opted for urinal use to continue with output. Pt transferred to [facility] as author provided linen change. Stood from elevated bed with supervision d/t pt's height. No physical assist or AD needed to perform STS transfers or ambulate to [facility]. Once new linen provided pt returned to bed with assist on LE to return supine. Pt's lunch tray set up and alarm set, needs in reach.    Kathee Delton Mobility Specialist 04/07/22, 12:38 PM

## 2022-04-07 NOTE — Progress Notes (Signed)
Established peritoneal patient known at [facility], currently receiving back up HD at [facility] MWF 10:20am. Patient normally drives himself to treatments. Patient stated no dialysis concerns.

## 2022-04-08 DIAGNOSIS — Z79899 Other long term (current) drug therapy: Secondary | ICD-10-CM | POA: Diagnosis not present

## 2022-04-08 DIAGNOSIS — N186 End stage renal disease: Secondary | ICD-10-CM | POA: Diagnosis present

## 2022-04-08 DIAGNOSIS — Z7982 Long term (current) use of aspirin: Secondary | ICD-10-CM | POA: Diagnosis not present

## 2022-04-08 DIAGNOSIS — T85611A Breakdown (mechanical) of intraperitoneal dialysis catheter, initial encounter: Secondary | ICD-10-CM | POA: Diagnosis present

## 2022-04-08 DIAGNOSIS — Z992 Dependence on renal dialysis: Secondary | ICD-10-CM | POA: Diagnosis not present

## 2022-04-08 DIAGNOSIS — E8721 Acute metabolic acidosis: Secondary | ICD-10-CM | POA: Diagnosis not present

## 2022-04-08 DIAGNOSIS — E782 Mixed hyperlipidemia: Secondary | ICD-10-CM | POA: Diagnosis present

## 2022-04-08 DIAGNOSIS — Z882 Allergy status to sulfonamides status: Secondary | ICD-10-CM | POA: Diagnosis not present

## 2022-04-08 DIAGNOSIS — D649 Anemia, unspecified: Secondary | ICD-10-CM | POA: Diagnosis present

## 2022-04-08 DIAGNOSIS — I10 Essential (primary) hypertension: Secondary | ICD-10-CM | POA: Diagnosis not present

## 2022-04-08 DIAGNOSIS — N2581 Secondary hyperparathyroidism of renal origin: Secondary | ICD-10-CM | POA: Diagnosis present

## 2022-04-08 DIAGNOSIS — Z806 Family history of leukemia: Secondary | ICD-10-CM | POA: Diagnosis not present

## 2022-04-08 DIAGNOSIS — M109 Gout, unspecified: Secondary | ICD-10-CM | POA: Diagnosis present

## 2022-04-08 DIAGNOSIS — I120 Hypertensive chronic kidney disease with stage 5 chronic kidney disease or end stage renal disease: Secondary | ICD-10-CM | POA: Diagnosis present

## 2022-04-08 DIAGNOSIS — N184 Chronic kidney disease, stage 4 (severe): Secondary | ICD-10-CM | POA: Diagnosis not present

## 2022-04-08 DIAGNOSIS — N185 Chronic kidney disease, stage 5: Secondary | ICD-10-CM | POA: Diagnosis not present

## 2022-04-08 DIAGNOSIS — Y812 Prosthetic and other implants, materials and accessory general- and plastic-surgery devices associated with adverse incidents: Secondary | ICD-10-CM | POA: Diagnosis present

## 2022-04-08 DIAGNOSIS — D631 Anemia in chronic kidney disease: Secondary | ICD-10-CM | POA: Diagnosis present

## 2022-04-08 DIAGNOSIS — Z888 Allergy status to other drugs, medicaments and biological substances status: Secondary | ICD-10-CM | POA: Diagnosis not present

## 2022-04-08 DIAGNOSIS — K219 Gastro-esophageal reflux disease without esophagitis: Secondary | ICD-10-CM | POA: Diagnosis present

## 2022-04-08 LAB — COMPREHENSIVE METABOLIC PANEL
ALT: 7 U/L (ref 0–44)
AST: 14 U/L — ABNORMAL LOW (ref 15–41)
Albumin: 3 g/dL — ABNORMAL LOW (ref 3.5–5.0)
Alkaline Phosphatase: 72 U/L (ref 38–126)
Anion gap: 7 (ref 5–15)
BUN: 22 mg/dL (ref 8–23)
CO2: 28 mmol/L (ref 22–32)
Calcium: 8.5 mg/dL — ABNORMAL LOW (ref 8.9–10.3)
Chloride: 103 mmol/L (ref 98–111)
Creatinine, Ser: 4.21 mg/dL — ABNORMAL HIGH (ref 0.61–1.24)
GFR, Estimated: 14 mL/min — ABNORMAL LOW (ref >60)
Glucose, Bld: 95 mg/dL (ref 70–99)
Potassium: 3.7 mmol/L (ref 3.5–5.1)
Sodium: 138 mmol/L (ref 135–145)
Total Bilirubin: 0.5 mg/dL (ref 0.3–1.2)
Total Protein: 5.9 g/dL — ABNORMAL LOW (ref 6.5–8.1)

## 2022-04-08 LAB — MAGNESIUM: Magnesium: 1.8 mg/dL (ref 1.7–2.4)

## 2022-04-08 LAB — CBC WITH DIFFERENTIAL/PLATELET
Abs Immature Granulocytes: 0.02 10*3/uL (ref 0.00–0.07)
Basophils Absolute: 0 10*3/uL (ref 0.0–0.1)
Basophils Relative: 0 %
Eosinophils Absolute: 0.1 10*3/uL (ref 0.0–0.5)
Eosinophils Relative: 2 %
HCT: 25.7 % — ABNORMAL LOW (ref 39.0–52.0)
Hemoglobin: 8.5 g/dL — ABNORMAL LOW (ref 13.0–17.0)
Immature Granulocytes: 0 %
Lymphocytes Relative: 34 %
Lymphs Abs: 2.7 10*3/uL (ref 0.7–4.0)
MCH: 29.3 pg (ref 26.0–34.0)
MCHC: 33.1 g/dL (ref 30.0–36.0)
MCV: 88.6 fL (ref 80.0–100.0)
Monocytes Absolute: 0.7 10*3/uL (ref 0.1–1.0)
Monocytes Relative: 9 %
Neutro Abs: 4.3 10*3/uL (ref 1.7–7.7)
Neutrophils Relative %: 55 %
Platelets: 109 10*3/uL — ABNORMAL LOW (ref 150–400)
RBC: 2.9 MIL/uL — ABNORMAL LOW (ref 4.22–5.81)
RDW: 15.9 % — ABNORMAL HIGH (ref 11.5–15.5)
WBC: 7.9 10*3/uL (ref 4.0–10.5)
nRBC: 0 % (ref 0.0–0.2)

## 2022-04-08 LAB — HEPATITIS B SURFACE ANTIBODY, QUANTITATIVE: Hep B S AB Quant (Post): <3.1 m[IU]/mL — ABNORMAL LOW (ref >9.9)

## 2022-04-08 LAB — PHOSPHORUS: Phosphorus: 3.2 mg/dL (ref 2.5–4.6)

## 2022-04-08 MED ORDER — OXYCODONE HCL 5 MG PO TABS: 5.0000 mg | ORAL_TABLET | ORAL | 0 refills | Status: DC | PRN

## 2022-04-08 MED ORDER — ONDANSETRON HCL 4 MG PO TABS: 4.0000 mg | ORAL_TABLET | Freq: Four times a day (QID) | ORAL | 0 refills | Status: DC | PRN

## 2022-04-08 NOTE — Plan of Care (Signed)
Patient ID: [name] [name], male   DOB: 1941-02-09, 81 y.o.   MRN: [id number]  Problem: Education: Goal: Knowledge of General Education information will improve Description: Including pain rating scale, medication(s)/side effects and non-pharmacologic comfort measures Outcome: Adequate for Discharge   Problem: Health Behavior/Discharge Planning: Goal: Ability to manage health-related needs will improve Outcome: Adequate for Discharge   Problem: Clinical Measurements: Goal: Ability to maintain clinical measurements within normal limits will improve Outcome: Adequate for Discharge Goal: Will remain free from infection Outcome: Adequate for Discharge Goal: Diagnostic test results will improve Outcome: Adequate for Discharge Goal: Respiratory complications will improve Outcome: Adequate for Discharge Goal: Cardiovascular complication will be avoided Outcome: Adequate for Discharge   Problem: Activity: Goal: Risk for activity intolerance will decrease Outcome: Adequate for Discharge   Problem: Nutrition: Goal: Adequate nutrition will be maintained Outcome: Adequate for Discharge   Problem: Coping: Goal: Level of anxiety will decrease Outcome: Adequate for Discharge   Problem: Elimination: Goal: Will not experience complications related to bowel motility Outcome: Adequate for Discharge Goal: Will not experience complications related to urinary retention Outcome: Adequate for Discharge   Problem: Pain Managment: Goal: General experience of comfort will improve Outcome: Adequate for Discharge   Problem: Safety: Goal: Ability to remain free from injury will improve Outcome: Adequate for Discharge   Problem: Skin Integrity: Goal: Risk for impaired skin integrity will decrease Outcome: Adequate for Discharge    Haydee Salter, RN

## 2022-04-08 NOTE — Progress Notes (Signed)
[facility]  ROUNDING NOTE   Subjective:   [name] [name] is a 81 year old male with past medical history including gout, hyperlipidemia, hypertension, and end-stage renal disease on hemodialysis.  Patient presents to the hospital for scheduled surgery to evaluate and possibly replace malfunctioning PD catheter.  Patient has been admitted under observation for Anemia [D64.9]  Patient is known to our practice and currently receives outpatient hemodialysis treatments at [facility] on a MWF schedule, supervised by Dr. Holley Raring.    Patient seen sitting up in bed, preparing to eat breakfast No family at bedside Alert and oriented Denies pain or discomfort from PD catheter site Tolerated dialysis treatment well yesterday No lower extremity edema Remains on room air   Objective:  Vital signs in last 24 hours:  Temp:  [97.9 F (36.6 C)-98.6 F (37 C)] 98 F (36.7 C) (08/19 0838) Pulse Rate:  [60-67] 60 (08/19 0838) Resp:  [6-25] 16 (08/19 0838) BP: (120-138)/(57-66) 120/58 (08/19 0838) SpO2:  [98 %-100 %] 100 % (08/19 0838)  Weight change:  Filed Weights   04/05/22 0717 04/07/22 1337  Weight: 83.9 kg 83.3 kg    Intake/Output: I/O last 3 completed shifts: In: 66 [P.O.:780] Out: 701.5 [Urine:700; Other:1.5]   Intake/Output this shift:  Total I/O In: 120 [P.O.:120] Out: -   Physical Exam: General: NAD, resting comfortably  Head: Normocephalic, atraumatic. Moist oral mucosal membranes  Eyes: Anicteric  Lungs:  Clear to auscultation, normal effort  Heart: Regular rate and rhythm sinus bradycardia  Abdomen:  Soft, nontender, lap sites, PD catheter  Extremities: Trace peripheral edema.  Neurologic: Nonfocal, moving all four extremities  Skin: No lesions  Access: Right chest PermCath PD catheter    Basic Metabolic Panel: Recent Labs  Lab 04/04/22 1106 04/05/22 0730 04/06/22 0522 04/07/22 0734 04/08/22 0528  NA 142 143 140 141 138  K 3.2* 3.5 3.7 4.3  3.7  CL 106 104 109 111 103  CO2 27  --  25 21* 28  GLUCOSE 96 108* 90 130* 95  BUN 21 25* 36* 43* 22  CREATININE 4.21* 6.20* 6.09* 7.01* 4.21*  CALCIUM 8.5*  --  8.0* 8.9 8.5*  MG  --   --   --  2.0 1.8  PHOS  --   --   --  5.4* 3.2     Liver Function Tests: Recent Labs  Lab 04/07/22 0734 04/08/22 0528  AST 13* 14*  ALT 8 7  ALKPHOS 79 72  BILITOT 0.4 0.5  PROT 6.5 5.9*  ALBUMIN 3.2* 3.0*    No results for input(s): "LIPASE", "AMYLASE" in the last 168 hours. No results for input(s): "AMMONIA" in the last 168 hours.  CBC: Recent Labs  Lab 04/04/22 1106 04/05/22 0730 04/05/22 1241 04/06/22 0522 04/07/22 0734 04/08/22 0528  WBC 5.8  --   --  6.5 8.2 7.9  NEUTROABS  --   --   --   --  6.5 4.3  HGB 6.5* 6.1* 7.8* 8.0* 9.0* 8.5*  HCT 20.2* 18.0* 23.2* 24.6* 27.2* 25.7*  MCV 88.6  --   --  88.5 88.3 88.6  PLT 124*  --   --  101* 120* 109*     Cardiac Enzymes: No results for input(s): "CKTOTAL", "CKMB", "CKMBINDEX", "TROPONINI" in the last 168 hours.  BNP: Invalid input(s): "POCBNP"  CBG: Recent Labs  Lab 04/06/22 2202  GLUCAP 145*     Microbiology: Results for orders placed or performed during the hospital encounter of 11/27/21  Urine  Culture     Status: None   Collection Time: 11/27/21  2:45 PM   Specimen: Urine, Random  Result Value Ref Range Status   Specimen Description   Final    URINE, RANDOM Performed at [facility], [address], [place], [place] [address]    Special Requests   Final    NONE Performed at [facility], [address], [place], [place] [address]    Culture   Final    NO GROWTH Performed at [facility], [place] [address], [place], [place] [address]    Report Status 11/29/2021 FINAL  Final    Coagulation Studies: No results for input(s): "LABPROT", "INR" in the last 72 hours.  Urinalysis: No results for input(s): "COLORURINE", "LABSPEC", "PHURINE", "GLUCOSEU", "HGBUR", "BILIRUBINUR",  "KETONESUR", "PROTEINUR", "UROBILINOGEN", "NITRITE", "LEUKOCYTESUR" in the last 72 hours.  Invalid input(s): "APPERANCEUR"    Imaging: No results found.   Medications:    sodium chloride      sodium chloride   Intravenous Once   amLODipine  10 mg Oral BH-q7a   bicalutamide  50 mg Oral BH-q7a   Chlorhexidine Gluconate Cloth  6 each Topical Daily   cinacalcet  30 mg Oral Daily   enalapril  20 mg Oral BID   febuxostat  40 mg Oral BH-q7a   hydrALAZINE  100 mg Oral TID   metoprolol tartrate  50 mg Oral BID   pravastatin  20 mg Oral q1800   sodium bicarbonate  650 mg Oral BID   sodium chloride flush  3 mL Intravenous Q12H   sodium chloride, acetaminophen **OR** acetaminophen, fluticasone, morphine injection, ondansetron **OR** ondansetron (ZOFRAN) IV, oxyCODONE, polyethylene glycol, sodium chloride flush  Assessment/ Plan:  [name] is a 81 y.o.  male with past medical history including gout, hyperlipidemia, hypertension, and end-stage renal disease on hemodialysis.  Patient presents to the hospital for scheduled surgery to evaluate and possibly replace malfunctioning PD catheter.  Patient has been admitted under observation for Anemia [D64.9]  [facility]/MWF/right chest PermCath  Anemia of chronic kidney disease Lab Results  Component Value Date   HGB 8.5 (L) 04/08/2022   Patient has received blood transfusion during admission.  Hemoglobin remains below target however monitoring closely.  Patient receives Mircera in outpatient clinic.  2.  End-stage renal disease on hemodialysis.  Currently transitioning to peritoneal dialysis with catheter replacement completed on 04/06/2022.Marland Kitchen  Received hemodialysis yesterday, UF goal 1.5 L achieved.  Patient will continue outpatient hemodialysis treatments at [facility] on a MWF schedule.  PD catheter will rest for 1 week.  Outpatient dialysis clinic will monitor and arrange training for use after discharge.  3. Secondary  Hyperparathyroidism: with outpatient labs: PTH 271, phosphorus 5.3, calcium 8.0 on 03/02/2022.   Lab Results  Component Value Date   CALCIUM 8.5 (L) 04/08/2022   CAION 1.11 (L) 04/05/2022   PHOS 3.2 04/08/2022     We will continue to monitor bone minerals during this admission  4.  Hypertension with chronic kidney disease.  Home regimen includes amlodipine, enalapril, hydralazine, and metoprolol.  Currently receiving these medications.  Blood pressure well controlled, 120/58  5.  Acute metabolic acidosis.  Corrected.  Continue sodium bicarb 650 mg twice daily.   LOS: 1 [place] 8/19/20232:47 PM

## 2022-04-08 NOTE — Discharge Summary (Signed)
Physician Discharge Summary  [name] [name] ELF:[id number] DOB: 1941-02-04 DOA: 04/05/2022  PCP: Dion Body, MD  Admit date: 04/05/2022 Discharge date: 04/09/2022  Time spent: 35 minutes  Recommendations for Outpatient Follow-up:   Anemia -Anemia panel consistent with anemia of chronic disease secondary to his end-stage renal disease. -Hemoglobin improved to 8.0 after getting 2 unit of PRBC.  No sign of any obvious bleeding. Lab Results  Component Value Date   HGB 8.5 (L) 04/08/2022   HGB 9.0 (L) 04/07/2022   HGB 8.0 (L) 04/06/2022   HGB 7.8 (L) 04/05/2022   HGB 6.1 (LL) 04/05/2022  -Transfuse If below 8   PD catheter dysfunction ([place]) Patient will be going for his planned laparoscopic surgery for repositioning of his malfunctioning PD catheter today. -Care per surgery and nephrology.   ESRD on dialysis [facility]) (HD on M/W/F) -Currently on HD for the past 1-1/2 months due to malfunctioning PD catheter. -8/18 discussed case with Dr. Carlota Raspberry Nephrology states patient had tried PD HD, malfunction with PD catheter.  On HD for 1.5 months .-8/18 discussed case with RN Jinny Blossom and she verifies patient has HD on M/W/F scheduled at the [place] center.  Nephrology will attempt to transition back to PD once newly replaced catheter has healed.  Okay to discharge. -Continue home bicarb and Sensipar   Benign essential hypertension -Blood pressure within goal. -Amlodipine 10 mg -Enalapril 20 mg twice daily -Hydralazine 100 mg 3 times daily -Metoprolol 50 mg twice daily.   Mixed hyperlipidemia -Lovastatin 20 mg daily   Discharge Diagnoses:  Principal Problem:   Anemia Active Problems:   PD catheter dysfunction ([place])   ESRD on dialysis ([place])   Benign essential hypertension   Mixed hyperlipidemia   Discharge Condition: Stable  Diet recommendation: Renal  Filed Weights   04/05/22 0717 04/07/22 1337  Weight: 83.9 kg 83.3 kg    History of present illness:  81 y.o.  BM PMHx ESRD on HD , HTN, HLD, gout   Admitted hospital to have his surgery for replacement of malfunctioning PD catheter and found to have hemoglobin of 6.5 which decreased to 6.1 on subsequent check.  Surgery got canceled and TRH was requested to do admission for blood transfusion.   Patient denies any obvious bleeding.  He was getting 1 unit of PRBC at the time of interview.  No recent illnesses.  No chest pain, shortness of breath or orthopnea.  No upper respiratory symptoms.  No urinary symptoms.  No recent change in his appetite or bowel habits.   Patient was having trouble with his PD catheter for a while and was placed on HD for the past 1-1/2 months.  He would like to continue peritoneal dialysis so he can stay at home to take care of his sick wife.   8/17: Patient received total of 2 unit of PRBC.  Hemoglobin at 8 this morning.  Waiting for PD catheter replacement and most likely will be getting his dialysis before discharge.  Hospital Course:  See above   Procedures: 8/17 Removal of peritoneal dialysis catheter.Laparoscopic peritoneal dialysis catheter placement (new catheter)    Consultations: Nephrology Surgery   Antibiotics Anti-infectives (From admission, onward)    Start     Ordered Stop   04/06/22 1456  ceFAZolin (ANCEF) 2-4 GM/100ML-% IVPB  Status:  Discontinued       Note to Pharmacy: Norton Blizzard A: cabinet override   04/06/22 1456 04/06/22 1505   04/06/22 0600  ceFAZolin (ANCEF) IVPB 2g/100 mL premix  04/05/22 1831 04/06/22 1600   04/05/22 0757  ceFAZolin (ANCEF) 2-4 GM/100ML-% IVPB       Note to Pharmacy: Jordan Hawks H: cabinet override   04/05/22 0757 04/05/22 1959   04/05/22 0600  ceFAZolin (ANCEF) IVPB 2g/100 mL premix  Status:  Discontinued        04/04/22 2314 04/05/22 1057         Discharge Exam: Vitals:   04/07/22 1745 04/07/22 2020 04/08/22 0440 04/08/22 0838  BP: 138/66 (!) 130/57 126/61 (!) 120/58  Pulse: 64 65 60 60   Resp: '18 18 20 16  '$ Temp: 97.9 F (36.6 C) 98.2 F (36.8 C) 98.6 F (37 C) 98 F (36.7 C)  TempSrc: Oral Oral Oral Oral  SpO2: 100% 100% 100% 100%  Weight:      Height:        General:/O x4 No acute respiratory distress Eyes: negative scleral hemorrhage, negative anisocoria, negative icterus ENT: Negative Runny nose, negative gingival bleeding, Neck:  Negative scars, masses, torticollis, lymphadenopathy, JVD Lungs: Clear to auscultation bilaterally without wheezes or crackles Cardiovascular: Regular rate and rhythm without murmur gallop or rub normal S1 and S2  Discharge Instructions   Allergies as of 04/08/2022       Reactions   Fish Allergy Swelling   Ciprofloxacin    Unknown reaction   Doxazosin Other (See Comments)   unknown        Medication List     STOP taking these medications    aspirin EC 81 MG tablet       TAKE these medications    acetaminophen 500 MG tablet Commonly known as: TYLENOL Take 2 tablets (1,000 mg total) by mouth every 6 (six) hours as needed for mild pain.   amLODipine 10 MG tablet Commonly known as: NORVASC Take 10 mg by mouth every morning.   azelastine 0.1 % nasal spray Commonly known as: ASTELIN Place 1 spray into both nostrils 2 (two) times daily as needed for rhinitis. Use in each nostril as directed   bicalutamide 50 MG tablet Commonly known as: CASODEX Take 50 mg by mouth every morning.   cinacalcet 30 MG tablet Commonly known as: SENSIPAR Take 30 mg by mouth daily.   enalapril 20 MG tablet Commonly known as: VASOTEC Take 20 mg by mouth 2 (two) times daily.   fluticasone 50 MCG/ACT nasal spray Commonly known as: FLONASE Place 1 spray into both nostrils daily as needed for allergies or rhinitis.   hydrALAZINE 100 MG tablet Commonly known as: APRESOLINE Take 100 mg by mouth 3 (three) times daily.   lovastatin 20 MG tablet Commonly known as: MEVACOR Take 20 mg by mouth at bedtime.   metoprolol tartrate 50  MG tablet Commonly known as: LOPRESSOR Take 50 mg by mouth 2 (two) times daily.   ondansetron 4 MG tablet Commonly known as: ZOFRAN Take 1 tablet (4 mg total) by mouth every 6 (six) hours as needed for nausea.   oxyCODONE 5 MG immediate release tablet Commonly known as: Oxy IR/ROXICODONE Take 1-2 tablets (5-10 mg total) by mouth every 4 (four) hours as needed for moderate pain or severe pain.   polyethylene glycol 17 g packet Commonly known as: MIRALAX / GLYCOLAX Take 17 g by mouth 3 (three) times a week.   sodium bicarbonate 650 MG tablet Take 650 mg by mouth 2 (two) times daily.   Uloric 40 MG tablet Generic drug: febuxostat Take 40 mg by mouth every morning.  Allergies  Allergen Reactions   Fish Allergy Swelling   Ciprofloxacin     Unknown reaction   Doxazosin Other (See Comments)    unknown    Follow-up Information     Piscoya, Jose, MD. Schedule an appointment as soon as possible for a visit in 2 week(s).   Specialty: General Surgery Why: please make appointment for [place]....s/p removal and replacement of peritoneal dialysis catheter Contact information: [address] [place] [address] [phone]                  The results of significant diagnostics from this hospitalization (including imaging, microbiology, ancillary and laboratory) are listed below for reference.    Significant Diagnostic Studies: No results found.  Microbiology: No results found for this or any previous visit (from the past 240 hour(s)).   Labs: Basic Metabolic Panel: Recent Labs  Lab 04/04/22 1106 04/05/22 0730 04/06/22 0522 04/07/22 0734 04/08/22 0528  NA 142 143 140 141 138  K 3.2* 3.5 3.7 4.3 3.7  CL 106 104 109 111 103  CO2 27  --  25 21* 28  GLUCOSE 96 108* 90 130* 95  BUN 21 25* 36* 43* 22  CREATININE 4.21* 6.20* 6.09* 7.01* 4.21*  CALCIUM 8.5*  --  8.0* 8.9 8.5*  MG  --   --   --  2.0 1.8  PHOS  --   --   --  5.4*  3.2   Liver Function Tests: Recent Labs  Lab 04/07/22 0734 04/08/22 0528  AST 13* 14*  ALT 8 7  ALKPHOS 79 72  BILITOT 0.4 0.5  PROT 6.5 5.9*  ALBUMIN 3.2* 3.0*   No results for input(s): "LIPASE", "AMYLASE" in the last 168 hours. No results for input(s): "AMMONIA" in the last 168 hours. CBC: Recent Labs  Lab 04/04/22 1106 04/05/22 0730 04/05/22 1241 04/06/22 0522 04/07/22 0734 04/08/22 0528  WBC 5.8  --   --  6.5 8.2 7.9  NEUTROABS  --   --   --   --  6.5 4.3  HGB 6.5* 6.1* 7.8* 8.0* 9.0* 8.5*  HCT 20.2* 18.0* 23.2* 24.6* 27.2* 25.7*  MCV 88.6  --   --  88.5 88.3 88.6  PLT 124*  --   --  101* 120* 109*   Cardiac Enzymes: No results for input(s): "CKTOTAL", "CKMB", "CKMBINDEX", "TROPONINI" in the last 168 hours. BNP: BNP (last 3 results) No results for input(s): "BNP" in the last 8760 hours.  ProBNP (last 3 results) No results for input(s): "PROBNP" in the last 8760 hours.  CBG: Recent Labs  Lab 04/06/22 2202  GLUCAP 145*       Signed:  Dia Crawford, MD [facility]

## 2022-04-08 NOTE — Progress Notes (Signed)
Patient ID: [name] [name], male   DOB: 01-05-1941, 81 y.o.   MRN: [id number]   An After Visit Summary was printed and given to the patient.   Patient education given on medication changes and follow up appointments and the patient expresses understanding and acceptance of instructions.  Daughter to drive home.   Haydee Salter 04/08/2022 12:57 PM

## 2022-04-13 ENCOUNTER — Inpatient Hospital Stay
Admission: EM | Admit: 2022-04-13 | Discharge: 2022-04-15 | DRG: 907 | Disposition: A | Discharge status: Home Health | Payer: Medicare Other | Attending: Internal Medicine | Admitting: Internal Medicine

## 2022-04-13 ENCOUNTER — Emergency Department: Payer: Medicare Other

## 2022-04-13 ENCOUNTER — Other Ambulatory Visit: Payer: Self-pay

## 2022-04-13 DIAGNOSIS — E119 Type 2 diabetes mellitus without complications: Secondary | ICD-10-CM | POA: Diagnosis present

## 2022-04-13 DIAGNOSIS — T85621A Displacement of intraperitoneal dialysis catheter, initial encounter: Secondary | ICD-10-CM

## 2022-04-13 DIAGNOSIS — I120 Hypertensive chronic kidney disease with stage 5 chronic kidney disease or end stage renal disease: Secondary | ICD-10-CM | POA: Diagnosis present

## 2022-04-13 DIAGNOSIS — Z992 Dependence on renal dialysis: Secondary | ICD-10-CM

## 2022-04-13 DIAGNOSIS — Z8546 Personal history of malignant neoplasm of prostate: Secondary | ICD-10-CM

## 2022-04-13 DIAGNOSIS — Y812 Prosthetic and other implants, materials and accessory general- and plastic-surgery devices associated with adverse incidents: Secondary | ICD-10-CM | POA: Diagnosis present

## 2022-04-13 DIAGNOSIS — I10 Essential (primary) hypertension: Secondary | ICD-10-CM | POA: Diagnosis present

## 2022-04-13 DIAGNOSIS — N2581 Secondary hyperparathyroidism of renal origin: Secondary | ICD-10-CM | POA: Diagnosis present

## 2022-04-13 DIAGNOSIS — G4730 Sleep apnea, unspecified: Secondary | ICD-10-CM | POA: Diagnosis present

## 2022-04-13 DIAGNOSIS — R1084 Generalized abdominal pain: Principal | ICD-10-CM

## 2022-04-13 DIAGNOSIS — K219 Gastro-esophageal reflux disease without esophagitis: Secondary | ICD-10-CM | POA: Diagnosis present

## 2022-04-13 DIAGNOSIS — N2889 Other specified disorders of kidney and ureter: Secondary | ICD-10-CM

## 2022-04-13 DIAGNOSIS — E1122 Type 2 diabetes mellitus with diabetic chronic kidney disease: Secondary | ICD-10-CM | POA: Diagnosis present

## 2022-04-13 DIAGNOSIS — D631 Anemia in chronic kidney disease: Secondary | ICD-10-CM | POA: Diagnosis present

## 2022-04-13 DIAGNOSIS — Z881 Allergy status to other antibiotic agents status: Secondary | ICD-10-CM

## 2022-04-13 DIAGNOSIS — D649 Anemia, unspecified: Secondary | ICD-10-CM | POA: Diagnosis present

## 2022-04-13 DIAGNOSIS — E876 Hypokalemia: Secondary | ICD-10-CM | POA: Diagnosis present

## 2022-04-13 DIAGNOSIS — E782 Mixed hyperlipidemia: Secondary | ICD-10-CM | POA: Diagnosis present

## 2022-04-13 DIAGNOSIS — N390 Urinary tract infection, site not specified: Secondary | ICD-10-CM | POA: Diagnosis present

## 2022-04-13 DIAGNOSIS — T85611A Breakdown (mechanical) of intraperitoneal dialysis catheter, initial encounter: Secondary | ICD-10-CM | POA: Diagnosis not present

## 2022-04-13 DIAGNOSIS — Z806 Family history of leukemia: Secondary | ICD-10-CM

## 2022-04-13 DIAGNOSIS — Z66 Do not resuscitate: Secondary | ICD-10-CM | POA: Diagnosis present

## 2022-04-13 DIAGNOSIS — M109 Gout, unspecified: Secondary | ICD-10-CM | POA: Diagnosis present

## 2022-04-13 DIAGNOSIS — Z79899 Other long term (current) drug therapy: Secondary | ICD-10-CM

## 2022-04-13 DIAGNOSIS — Z91013 Allergy to seafood: Secondary | ICD-10-CM

## 2022-04-13 DIAGNOSIS — N186 End stage renal disease: Secondary | ICD-10-CM

## 2022-04-13 DIAGNOSIS — M1990 Unspecified osteoarthritis, unspecified site: Secondary | ICD-10-CM | POA: Diagnosis present

## 2022-04-13 LAB — CBC WITH DIFFERENTIAL/PLATELET
Abs Immature Granulocytes: 0.02 10*3/uL (ref 0.00–0.07)
Basophils Absolute: 0 10*3/uL (ref 0.0–0.1)
Basophils Relative: 0 %
Eosinophils Absolute: 0.4 10*3/uL (ref 0.0–0.5)
Eosinophils Relative: 6 %
HCT: 24.5 % — ABNORMAL LOW (ref 39.0–52.0)
Hemoglobin: 8 g/dL — ABNORMAL LOW (ref 13.0–17.0)
Immature Granulocytes: 0 %
Lymphocytes Relative: 35 %
Lymphs Abs: 2.3 10*3/uL (ref 0.7–4.0)
MCH: 29.3 pg (ref 26.0–34.0)
MCHC: 32.7 g/dL (ref 30.0–36.0)
MCV: 89.7 fL (ref 80.0–100.0)
Monocytes Absolute: 0.7 10*3/uL (ref 0.1–1.0)
Monocytes Relative: 10 %
Neutro Abs: 3.3 10*3/uL (ref 1.7–7.7)
Neutrophils Relative %: 49 %
Platelets: 114 10*3/uL — ABNORMAL LOW (ref 150–400)
RBC: 2.73 MIL/uL — ABNORMAL LOW (ref 4.22–5.81)
RDW: 15.9 % — ABNORMAL HIGH (ref 11.5–15.5)
WBC: 6.7 10*3/uL (ref 4.0–10.5)
nRBC: 0 % (ref 0.0–0.2)

## 2022-04-13 LAB — COMPREHENSIVE METABOLIC PANEL
ALT: 5 U/L (ref 0–44)
AST: 15 U/L (ref 15–41)
Albumin: 3.5 g/dL (ref 3.5–5.0)
Alkaline Phosphatase: 84 U/L (ref 38–126)
Anion gap: 10 (ref 5–15)
BUN: 17 mg/dL (ref 8–23)
CO2: 28 mmol/L (ref 22–32)
Calcium: 8.8 mg/dL — ABNORMAL LOW (ref 8.9–10.3)
Chloride: 102 mmol/L (ref 98–111)
Creatinine, Ser: 4.13 mg/dL — ABNORMAL HIGH (ref 0.61–1.24)
GFR, Estimated: 14 mL/min — ABNORMAL LOW (ref >60)
Glucose, Bld: 112 mg/dL — ABNORMAL HIGH (ref 70–99)
Potassium: 3.3 mmol/L — ABNORMAL LOW (ref 3.5–5.1)
Sodium: 140 mmol/L (ref 135–145)
Total Bilirubin: 0.5 mg/dL (ref 0.3–1.2)
Total Protein: 6.9 g/dL (ref 6.5–8.1)

## 2022-04-13 LAB — LIPASE, BLOOD: Lipase: 45 U/L (ref 11–51)

## 2022-04-13 MED ORDER — FENTANYL CITRATE PF 50 MCG/ML IJ SOSY
50.0000 ug | PREFILLED_SYRINGE | INTRAMUSCULAR | Status: DC | PRN
  Administered 2022-04-13: 50 ug via INTRAVENOUS
  Filled 2022-04-13: qty 1

## 2022-04-13 MED ORDER — HALOPERIDOL LACTATE 5 MG/ML IJ SOLN
5.0000 mg | Freq: Once | INTRAMUSCULAR | Status: AC
  Administered 2022-04-13: 5 mg via INTRAMUSCULAR

## 2022-04-13 MED ORDER — HALOPERIDOL LACTATE 5 MG/ML IJ SOLN
INTRAMUSCULAR | Status: AC
  Filled 2022-04-13: qty 1

## 2022-04-13 NOTE — ED Provider Notes (Addendum)
[facility] Provider Note    Event Date/Time   First MD Initiated Contact with Patient 04/13/22 2154     (approximate)   History   Vascular Access Problem and Abdominal Pain   HPI  [name] [name] is a 81 y.o. male history of ESRD previously on PD just recently transition to temporary HD while PD catheter was readjusted operatively this past week.  Went to first outpatient PD session today.  States that he had no issues with the session but began to have worsening abdominal distention swelling and pain.  Does still make urine.  Has some urgency.  No measured fevers.  Feels nauseated.  Also feels like he has not had a bowel movement in a while.     Physical Exam   Triage Vital Signs: ED Triage Vitals  Enc Vitals Group     BP 04/13/22 1924 (!) 129/55     Pulse Rate 04/13/22 1924 67     Resp 04/13/22 1924 18     Temp 04/13/22 1924 98.2 F (36.8 C)     Temp Source 04/13/22 1924 Oral     SpO2 04/13/22 1924 100 %     Weight 04/13/22 1924 180 lb (81.6 kg)     Height 04/13/22 1924 '5\' 9"'$  (1.753 m)     Head Circumference --      Peak Flow --      Pain Score 04/13/22 1932 8     Pain Loc --      Pain Edu? --      Excl. in [place]? --     Most recent vital signs: Vitals:   04/13/22 1924  BP: (!) 129/55  Pulse: 67  Resp: 18  Temp: 98.2 F (36.8 C)  SpO2: 100%     Constitutional: Alert  Eyes: Conjunctivae are normal.  Head: Atraumatic. Nose: No congestion/rhinnorhea. Mouth/Throat: Mucous membranes are moist.   Neck: Painless ROM.  Cardiovascular:   Good peripheral circulation. Respiratory: Normal respiratory effort.  No retractions.  Gastrointestinal: Soft with contusions consistent with postoperative state.  No guarding.  Does have some mild fullness. Musculoskeletal:  no deformity Neurologic:  MAE spontaneously. No gross focal neurologic deficits are appreciated.  Skin:  Skin is warm, dry and intact. No rash noted. Psychiatric: Mood and affect  are normal. Speech and behavior are normal.    ED Results / Procedures / Treatments   Labs (all labs ordered are listed, but only abnormal results are displayed) Labs Reviewed  CBC WITH DIFFERENTIAL/PLATELET - Abnormal; Notable for the following components:      Result Value   RBC 2.73 (*)    Hemoglobin 8.0 (*)    HCT 24.5 (*)    RDW 15.9 (*)    Platelets 114 (*)    All other components within normal limits  COMPREHENSIVE METABOLIC PANEL - Abnormal; Notable for the following components:   Potassium 3.3 (*)    Glucose, Bld 112 (*)    Creatinine, Ser 4.13 (*)    Calcium 8.8 (*)    GFR, Estimated 14 (*)    All other components within normal limits  LIPASE, BLOOD  URINALYSIS, ROUTINE W REFLEX MICROSCOPIC     EKG     RADIOLOGY Please see ED Course for my review and interpretation.  I personally reviewed all radiographic images ordered to evaluate for the above acute complaints and reviewed radiology reports and findings.  These findings were personally discussed with the patient.  Please see medical record for radiology  report.    PROCEDURES:  Critical Care performed: No  Procedures   MEDICATIONS ORDERED IN ED: Medications  fentaNYL (SUBLIMAZE) injection 50 mcg (has no administration in time range)  haloperidol lactate (HALDOL) injection 5 mg (has no administration in time range)     IMPRESSION / MDM / ASSESSMENT AND PLAN / ED COURSE  I reviewed the triage vital signs and the nursing notes.                              Differential diagnosis includes, but is not limited to, obstruction, urinary retention, mass, diverticulitis, constipation, UTI, postop complication, hematoma, SBP  Patient presented to the ER for evaluation of symptoms as described above.  Patient having abdominal pain postop.  Having received PD today.  Does not appear to be volume overloaded at this time.  Given postop state CT imaging will be ordered.  Will check urine.  Unfortunately due to  ITE air there is been a delay in receiving CT imaging and am not able to personally review the images.  Patient will be signed out to oncoming physician pending evaluation of CT imaging.   Clinical Course as of 04/13/22 2337  Thu Apr 13, 2022  2334 Patient becoming increasingly agitated wanting to leave but unable to even stand or get out of bed.  Daughter states that he has done this before her particularly in the evening no history of dementia but has had to receive medications to help calm him down with injection in the past.  Will order dose of Haldol still some pain medication continue observe. [PR]    Clinical Course User Index [PR] Merlyn Lot, MD    FINAL CLINICAL IMPRESSION(S) / ED DIAGNOSES   Final diagnoses:  Generalized abdominal pain     Rx / DC Orders   ED Discharge Orders     None        Note:  This document was prepared using Dragon voice recognition software and may include unintentional dictation errors.       Merlyn Lot, MD 04/13/22 2337

## 2022-04-13 NOTE — ED Notes (Signed)
[name] [name] (daughter) [phone]  Had to go home and to call if pt needs to be picked up.

## 2022-04-13 NOTE — ED Provider Notes (Signed)
----------------------------------------- 11:54 PM on 04/13/2022 -----------------------------------------  Assuming care from Dr. Quentin Cornwall.  In short, [name] [name] is a 81 y.o. male with a chief complaint of AMS/agitation, abdominal pain, cannot stand, etc.  Refer to the original H&P for additional details.  The current plan of care is to follow up CT, reassess.   Clinical Course as of 04/14/22 0729  Thu Apr 13, 2022  2334 Patient becoming increasingly agitated wanting to leave but unable to even stand or get out of bed.  Daughter states that he has done this before her particularly in the evening no history of dementia but has had to receive medications to help calm him down with injection in the past.  Will order dose of Haldol still some pain medication continue observe. [PR]  Fri Apr 14, 2022  0235 I viewed and interpreted the patient's CT scan of the abdomen and pelvis.  I cannot appreciate any acute abnormality.  However, the radiologist commented that the peritoneal dialysis catheter seems to be disconnected within the subcutaneous tissue.  I reassessed the patient and he is resting comfortably.  However he said that he is still experiencing of abdominal pain and he is very tired and frustrated that he continues to have issues.  He stated to me that he had dialysis on Wednesday and he is supposed to do Mondays Wednesdays and Fridays through the chest catheter since the PD catheter is not working.  He says that they try to use it on either Wednesday or Thursday but it did not work.  This seems to be consistent with reports that it is disconnected.  Given that he is reportedly supposed to have dialysis this morning and is continuing to have some abdominal pain and an apparent malfunction of the PD catheter that was just recently placed by Dr. Hampton Abbot about a week ago, I will consult the hospitalist for admission and evaluation by nephrology and/or general surgery later today to see if  something can be done to assist with the PD catheter.  No indication for emergent onsite intervention by surgery or nephrology at this time. [CF]  4098 Consulted by phone with Dr. Sidney Ace with the hospitalist service.  I explained the somewhat confusing situation.  He agrees with the plan to admit the patient to the hospital and consult appropriate services. [CF]    Clinical Course User Index [CF] Hinda Kehr, MD [PR] Merlyn Lot, MD     Medications  fentaNYL (SUBLIMAZE) injection 50 mcg (50 mcg Intravenous Given 04/13/22 2346)  oxyCODONE (Oxy IR/ROXICODONE) immediate release tablet 5-10 mg (has no administration in time range)  febuxostat (ULORIC) tablet 40 mg (has no administration in time range)  bicalutamide (CASODEX) tablet 50 mg (has no administration in time range)  amLODipine (NORVASC) tablet 10 mg (has no administration in time range)  enalapril (VASOTEC) tablet 20 mg (has no administration in time range)  hydrALAZINE (APRESOLINE) tablet 100 mg (has no administration in time range)  metoprolol tartrate (LOPRESSOR) tablet 50 mg (has no administration in time range)  pravastatin (PRAVACHOL) tablet 20 mg (has no administration in time range)  cinacalcet (SENSIPAR) tablet 30 mg (has no administration in time range)  polyethylene glycol (MIRALAX / GLYCOLAX) packet 17 g (has no administration in time range)  sodium bicarbonate tablet 650 mg (has no administration in time range)  azelastine (ASTELIN) 0.1 % nasal spray 1 spray (has no administration in time range)  fluticasone (FLONASE) 50 MCG/ACT nasal spray 1 spray (has no administration in time range)  acetaminophen (TYLENOL) tablet 650 mg (has no administration in time range)    Or  acetaminophen (TYLENOL) suppository 650 mg (has no administration in time range)  traZODone (DESYREL) tablet 25 mg (has no administration in time range)  magnesium hydroxide (MILK OF MAGNESIA) suspension 30 mL (has no administration in time range)   ondansetron (ZOFRAN) tablet 4 mg (has no administration in time range)    Or  ondansetron (ZOFRAN) injection 4 mg (has no administration in time range)  heparin injection 5,000 Units (5,000 Units Subcutaneous Given 04/14/22 0517)  cefTRIAXone (ROCEPHIN) 1 g in sodium chloride 0.9 % 100 mL IVPB (has no administration in time range)  haloperidol lactate (HALDOL) injection 5 mg (5 mg Intramuscular Given 04/13/22 2337)  potassium chloride (KLOR-CON) packet 40 mEq (40 mEq Oral Given 04/14/22 0505)     ED Discharge Orders     None      Final diagnoses:  Generalized abdominal pain  ESRD on hemodialysis [facility])  Malposition of peritoneal dialysis catheter (HCC)     Hinda Kehr, MD 04/14/22 0730

## 2022-04-13 NOTE — ED Triage Notes (Signed)
Pt presents to ER with c/o problem with his PD port.  Pt states 8/16 he had his PD port in his left side of his abdomen removed and a new one placed in his right side.  Pt states they have been trying to flush his new PD port, and the fluid has been retained in his abdomen and his abdomen has been swelling up.  Pt currently using HD while awaiting this new port to work.  Pt states he is having pain in his lower abdomen.  Pt denies n/v/d at this time.  Pt otherwise A&O x4 at this time in NAD.

## 2022-04-14 ENCOUNTER — Encounter
Admission: EM | Disposition: A | Discharge status: Home Health | Payer: Self-pay | Source: Home / Self Care | Attending: Internal Medicine

## 2022-04-14 ENCOUNTER — Other Ambulatory Visit: Payer: Self-pay

## 2022-04-14 ENCOUNTER — Observation Stay: Payer: Medicare Other | Admitting: Certified Registered Nurse Anesthetist

## 2022-04-14 ENCOUNTER — Encounter: Payer: Self-pay | Admitting: Family Medicine

## 2022-04-14 DIAGNOSIS — E782 Mixed hyperlipidemia: Secondary | ICD-10-CM | POA: Diagnosis present

## 2022-04-14 DIAGNOSIS — R1084 Generalized abdominal pain: Secondary | ICD-10-CM

## 2022-04-14 DIAGNOSIS — E876 Hypokalemia: Secondary | ICD-10-CM

## 2022-04-14 DIAGNOSIS — Z8546 Personal history of malignant neoplasm of prostate: Secondary | ICD-10-CM | POA: Diagnosis not present

## 2022-04-14 DIAGNOSIS — Z992 Dependence on renal dialysis: Secondary | ICD-10-CM | POA: Diagnosis not present

## 2022-04-14 DIAGNOSIS — Z66 Do not resuscitate: Secondary | ICD-10-CM | POA: Diagnosis present

## 2022-04-14 DIAGNOSIS — K219 Gastro-esophageal reflux disease without esophagitis: Secondary | ICD-10-CM | POA: Diagnosis present

## 2022-04-14 DIAGNOSIS — I120 Hypertensive chronic kidney disease with stage 5 chronic kidney disease or end stage renal disease: Secondary | ICD-10-CM | POA: Diagnosis present

## 2022-04-14 DIAGNOSIS — T85611A Breakdown (mechanical) of intraperitoneal dialysis catheter, initial encounter: Secondary | ICD-10-CM | POA: Diagnosis present

## 2022-04-14 DIAGNOSIS — E1122 Type 2 diabetes mellitus with diabetic chronic kidney disease: Secondary | ICD-10-CM | POA: Diagnosis present

## 2022-04-14 DIAGNOSIS — N186 End stage renal disease: Secondary | ICD-10-CM | POA: Diagnosis not present

## 2022-04-14 DIAGNOSIS — N2581 Secondary hyperparathyroidism of renal origin: Secondary | ICD-10-CM | POA: Diagnosis present

## 2022-04-14 DIAGNOSIS — N2889 Other specified disorders of kidney and ureter: Secondary | ICD-10-CM | POA: Diagnosis present

## 2022-04-14 DIAGNOSIS — N390 Urinary tract infection, site not specified: Secondary | ICD-10-CM

## 2022-04-14 DIAGNOSIS — Y812 Prosthetic and other implants, materials and accessory general- and plastic-surgery devices associated with adverse incidents: Secondary | ICD-10-CM | POA: Diagnosis present

## 2022-04-14 DIAGNOSIS — I10 Essential (primary) hypertension: Secondary | ICD-10-CM

## 2022-04-14 DIAGNOSIS — D631 Anemia in chronic kidney disease: Secondary | ICD-10-CM | POA: Diagnosis present

## 2022-04-14 DIAGNOSIS — M109 Gout, unspecified: Secondary | ICD-10-CM | POA: Diagnosis present

## 2022-04-14 DIAGNOSIS — Z806 Family history of leukemia: Secondary | ICD-10-CM | POA: Diagnosis not present

## 2022-04-14 DIAGNOSIS — Z91013 Allergy to seafood: Secondary | ICD-10-CM | POA: Diagnosis not present

## 2022-04-14 DIAGNOSIS — Z881 Allergy status to other antibiotic agents status: Secondary | ICD-10-CM | POA: Diagnosis not present

## 2022-04-14 DIAGNOSIS — M1990 Unspecified osteoarthritis, unspecified site: Secondary | ICD-10-CM | POA: Diagnosis present

## 2022-04-14 DIAGNOSIS — G4730 Sleep apnea, unspecified: Secondary | ICD-10-CM | POA: Diagnosis present

## 2022-04-14 DIAGNOSIS — Z79899 Other long term (current) drug therapy: Secondary | ICD-10-CM | POA: Diagnosis not present

## 2022-04-14 HISTORY — PX: CAPD REVISION: SHX5260

## 2022-04-14 LAB — CBC
HCT: 23 % — ABNORMAL LOW (ref 39.0–52.0)
Hemoglobin: 7.7 g/dL — ABNORMAL LOW (ref 13.0–17.0)
MCH: 29.6 pg (ref 26.0–34.0)
MCHC: 33.5 g/dL (ref 30.0–36.0)
MCV: 88.5 fL (ref 80.0–100.0)
Platelets: 97 10*3/uL — ABNORMAL LOW (ref 150–400)
RBC: 2.6 MIL/uL — ABNORMAL LOW (ref 4.22–5.81)
RDW: 15.9 % — ABNORMAL HIGH (ref 11.5–15.5)
WBC: 7 10*3/uL (ref 4.0–10.5)
nRBC: 0 % (ref 0.0–0.2)

## 2022-04-14 LAB — URINALYSIS, ROUTINE W REFLEX MICROSCOPIC
Bilirubin Urine: NEGATIVE
Glucose, UA: NEGATIVE mg/dL
Ketones, ur: NEGATIVE mg/dL
Leukocytes,Ua: NEGATIVE
Nitrite: NEGATIVE
Protein, ur: 100 mg/dL — AB
RBC / HPF: >50 RBC/hpf — ABNORMAL HIGH (ref 0–5)
Specific Gravity, Urine: 1.006 (ref 1.005–1.030)
pH: 9 — ABNORMAL HIGH (ref 5.0–8.0)

## 2022-04-14 LAB — BASIC METABOLIC PANEL
Anion gap: 6 (ref 5–15)
BUN: 17 mg/dL (ref 8–23)
CO2: 27 mmol/L (ref 22–32)
Calcium: 8.8 mg/dL — ABNORMAL LOW (ref 8.9–10.3)
Chloride: 109 mmol/L (ref 98–111)
Creatinine, Ser: 4.54 mg/dL — ABNORMAL HIGH (ref 0.61–1.24)
GFR, Estimated: 12 mL/min — ABNORMAL LOW (ref >60)
Glucose, Bld: 110 mg/dL — ABNORMAL HIGH (ref 70–99)
Potassium: 3.2 mmol/L — ABNORMAL LOW (ref 3.5–5.1)
Sodium: 142 mmol/L (ref 135–145)

## 2022-04-14 LAB — HEPATITIS B SURFACE ANTIGEN: Hepatitis B Surface Ag: NONREACTIVE

## 2022-04-14 SURGERY — LAPAROSCOPIC REVISION CONTINUOUS AMBULATORY PERITONEAL DIALYSIS  (CAPD) CATHETER
Anesthesia: General | Site: Abdomen

## 2022-04-14 MED ORDER — HEPARIN SODIUM (PORCINE) 5000 UNIT/ML IJ SOLN
5000.0000 [IU] | Freq: Three times a day (TID) | INTRAMUSCULAR | Status: DC
  Administered 2022-04-14 – 2022-04-15 (×4): 5000 [IU] via SUBCUTANEOUS
  Filled 2022-04-14 (×4): qty 1

## 2022-04-14 MED ORDER — GLYCOPYRROLATE 0.2 MG/ML IJ SOLN
INTRAMUSCULAR | Status: DC | PRN
  Administered 2022-04-14: .2 mg via INTRAVENOUS

## 2022-04-14 MED ORDER — HEPARIN SODIUM (PORCINE) 1000 UNIT/ML DIALYSIS: 1000.0000 [IU] | INTRAMUSCULAR | Status: DC | PRN

## 2022-04-14 MED ORDER — ACETAMINOPHEN 650 MG RE SUPP: 650.0000 mg | Freq: Four times a day (QID) | RECTAL | Status: DC | PRN

## 2022-04-14 MED ORDER — POLYETHYLENE GLYCOL 3350 17 G PO PACK
17.0000 g | PACK | ORAL | Status: DC
  Filled 2022-04-14: qty 1

## 2022-04-14 MED ORDER — METOPROLOL TARTRATE 50 MG PO TABS
50.0000 mg | ORAL_TABLET | Freq: Two times a day (BID) | ORAL | Status: DC
  Administered 2022-04-14 – 2022-04-15 (×2): 50 mg via ORAL
  Filled 2022-04-14 (×3): qty 1

## 2022-04-14 MED ORDER — HYDRALAZINE HCL 50 MG PO TABS
100.0000 mg | ORAL_TABLET | Freq: Three times a day (TID) | ORAL | Status: DC
  Administered 2022-04-14 – 2022-04-15 (×2): 100 mg via ORAL
  Filled 2022-04-14 (×3): qty 2

## 2022-04-14 MED ORDER — ONDANSETRON HCL 4 MG PO TABS: 4.0000 mg | ORAL_TABLET | Freq: Four times a day (QID) | ORAL | Status: DC | PRN

## 2022-04-14 MED ORDER — FEBUXOSTAT 40 MG PO TABS
40.0000 mg | ORAL_TABLET | Freq: Every day | ORAL | Status: DC
  Administered 2022-04-15: 40 mg via ORAL
  Filled 2022-04-14 (×2): qty 1

## 2022-04-14 MED ORDER — LIDOCAINE HCL (PF) 1 % IJ SOLN: 5.0000 mL | INTRAMUSCULAR | Status: DC | PRN

## 2022-04-14 MED ORDER — CINACALCET HCL 30 MG PO TABS
30.0000 mg | ORAL_TABLET | Freq: Every day | ORAL | Status: DC
  Administered 2022-04-15: 30 mg via ORAL
  Filled 2022-04-14 (×2): qty 1

## 2022-04-14 MED ORDER — PROPOFOL 10 MG/ML IV BOLUS
INTRAVENOUS | Status: DC | PRN
  Administered 2022-04-14: 120 mg via INTRAVENOUS

## 2022-04-14 MED ORDER — CEFAZOLIN SODIUM-DEXTROSE 2-3 GM-%(50ML) IV SOLR
INTRAVENOUS | Status: DC | PRN
  Administered 2022-04-14: 2 g via INTRAVENOUS

## 2022-04-14 MED ORDER — ONDANSETRON HCL 4 MG/2ML IJ SOLN
INTRAMUSCULAR | Status: DC | PRN
  Administered 2022-04-14: 4 mg via INTRAVENOUS

## 2022-04-14 MED ORDER — SODIUM CHLORIDE 0.9 % IV SOLN
1.0000 g | INTRAVENOUS | Status: DC
  Administered 2022-04-14: 1 g via INTRAVENOUS
  Filled 2022-04-14: qty 1

## 2022-04-14 MED ORDER — LIDOCAINE HCL (CARDIAC) PF 100 MG/5ML IV SOSY
PREFILLED_SYRINGE | INTRAVENOUS | Status: DC | PRN
  Administered 2022-04-14: 80 mg via INTRAVENOUS

## 2022-04-14 MED ORDER — SODIUM CHLORIDE 0.9 % IV SOLN: INTRAVENOUS | Status: DC | PRN

## 2022-04-14 MED ORDER — FLUTICASONE PROPIONATE 50 MCG/ACT NA SUSP: 1.0000 | Freq: Every day | NASAL | Status: DC | PRN

## 2022-04-14 MED ORDER — PRAVASTATIN SODIUM 20 MG PO TABS: 20.0000 mg | ORAL_TABLET | Freq: Every day | ORAL | Status: DC

## 2022-04-14 MED ORDER — TRAZODONE HCL 50 MG PO TABS: 25.0000 mg | ORAL_TABLET | Freq: Every evening | ORAL | Status: DC | PRN

## 2022-04-14 MED ORDER — FENTANYL CITRATE (PF) 100 MCG/2ML IJ SOLN
INTRAMUSCULAR | Status: DC | PRN
Start: 2022-04-14 — End: 2022-04-14
  Administered 2022-04-14 (×2): 50 ug via INTRAVENOUS

## 2022-04-14 MED ORDER — PHENYLEPHRINE 80 MCG/ML (10ML) SYRINGE FOR IV PUSH (FOR BLOOD PRESSURE SUPPORT)
PREFILLED_SYRINGE | INTRAVENOUS | Status: DC | PRN
  Administered 2022-04-14 (×2): 80 ug via INTRAVENOUS

## 2022-04-14 MED ORDER — AZELASTINE HCL 0.1 % NA SOLN: 1.0000 | Freq: Two times a day (BID) | NASAL | Status: DC | PRN

## 2022-04-14 MED ORDER — PHENYLEPHRINE HCL-NACL 20-0.9 MG/250ML-% IV SOLN
INTRAVENOUS | Status: DC | PRN
  Administered 2022-04-14: 30 ug/min via INTRAVENOUS

## 2022-04-14 MED ORDER — OXYCODONE HCL 5 MG PO TABS: 5.0000 mg | ORAL_TABLET | ORAL | Status: DC | PRN

## 2022-04-14 MED ORDER — EPHEDRINE SULFATE (PRESSORS) 50 MG/ML IJ SOLN
INTRAMUSCULAR | Status: DC | PRN
  Administered 2022-04-14 (×2): 5 mg via INTRAVENOUS

## 2022-04-14 MED ORDER — MAGNESIUM HYDROXIDE 400 MG/5ML PO SUSP: 30.0000 mL | Freq: Every day | ORAL | Status: DC | PRN

## 2022-04-14 MED ORDER — ANTICOAGULANT SODIUM CITRATE 4% (200MG/5ML) IV SOLN: 5.0000 mL | Status: DC | PRN

## 2022-04-14 MED ORDER — SODIUM CHLORIDE 0.9 % IR SOLN: Status: DC | PRN

## 2022-04-14 MED ORDER — ONDANSETRON HCL 4 MG/2ML IJ SOLN: 4.0000 mg | Freq: Four times a day (QID) | INTRAMUSCULAR | Status: DC | PRN

## 2022-04-14 MED ORDER — POTASSIUM CHLORIDE 20 MEQ PO PACK
40.0000 meq | PACK | Freq: Once | ORAL | Status: AC
  Administered 2022-04-14: 40 meq via ORAL
  Filled 2022-04-14: qty 2

## 2022-04-14 MED ORDER — ALTEPLASE 2 MG IJ SOLR: 2.0000 mg | Freq: Once | INTRAMUSCULAR | Status: DC | PRN

## 2022-04-14 MED ORDER — SODIUM BICARBONATE 650 MG PO TABS
650.0000 mg | ORAL_TABLET | Freq: Two times a day (BID) | ORAL | Status: DC
  Administered 2022-04-14 – 2022-04-15 (×2): 650 mg via ORAL
  Filled 2022-04-14 (×3): qty 1

## 2022-04-14 MED ORDER — ACETAMINOPHEN 325 MG PO TABS: 650.0000 mg | ORAL_TABLET | Freq: Four times a day (QID) | ORAL | Status: DC | PRN

## 2022-04-14 MED ORDER — AMLODIPINE BESYLATE 10 MG PO TABS
10.0000 mg | ORAL_TABLET | Freq: Every day | ORAL | Status: DC
  Administered 2022-04-15: 10 mg via ORAL
  Filled 2022-04-14: qty 2
  Filled 2022-04-14: qty 1

## 2022-04-14 MED ORDER — ROCURONIUM BROMIDE 100 MG/10ML IV SOLN
INTRAVENOUS | Status: DC | PRN
  Administered 2022-04-14: 50 mg via INTRAVENOUS
  Administered 2022-04-14: 10 mg via INTRAVENOUS
  Administered 2022-04-14 (×2): 20 mg via INTRAVENOUS

## 2022-04-14 MED ORDER — BICALUTAMIDE 50 MG PO TABS
50.0000 mg | ORAL_TABLET | Freq: Every day | ORAL | Status: DC
  Administered 2022-04-15: 50 mg via ORAL
  Filled 2022-04-14 (×2): qty 1

## 2022-04-14 MED ORDER — PENTAFLUOROPROP-TETRAFLUOROETH EX AERO
1.0000 | INHALATION_SPRAY | CUTANEOUS | Status: DC | PRN
Start: 2022-04-14 — End: 2022-04-15

## 2022-04-14 MED ORDER — ENALAPRIL MALEATE 20 MG PO TABS
20.0000 mg | ORAL_TABLET | Freq: Two times a day (BID) | ORAL | Status: DC
  Administered 2022-04-14 – 2022-04-15 (×2): 20 mg via ORAL
  Filled 2022-04-14: qty 1
  Filled 2022-04-14: qty 2
  Filled 2022-04-14: qty 1

## 2022-04-14 MED ORDER — BUPIVACAINE-EPINEPHRINE 0.5% -1:200000 IJ SOLN
INTRAMUSCULAR | Status: DC | PRN
  Administered 2022-04-14: 30 mL

## 2022-04-14 MED ORDER — FENTANYL CITRATE (PF) 100 MCG/2ML IJ SOLN: 25.0000 ug | INTRAMUSCULAR | Status: DC | PRN

## 2022-04-14 MED ORDER — CHLORHEXIDINE GLUCONATE CLOTH 2 % EX PADS
6.0000 | MEDICATED_PAD | Freq: Every day | CUTANEOUS | Status: DC
  Administered 2022-04-14 – 2022-04-15 (×2): 6 via TOPICAL

## 2022-04-14 MED ORDER — HEPARIN SODIUM (PORCINE) 5000 UNIT/ML IJ SOLN
INTRAMUSCULAR | Status: AC
  Filled 2022-04-14: qty 1

## 2022-04-14 MED ORDER — LIDOCAINE-PRILOCAINE 2.5-2.5 % EX CREA: 1.0000 | TOPICAL_CREAM | CUTANEOUS | Status: DC | PRN

## 2022-04-14 SURGICAL SUPPLY — 55 items
ADAPTER CATH DIALYSIS 4X8 IT L (MISCELLANEOUS) IMPLANT
BLADE SURG 11 STRL SS SAFETY (MISCELLANEOUS) ×1 IMPLANT
CATH EXTENDED DIALYSIS (CATHETERS) ×1 IMPLANT
DERMABOND ADVANCED (GAUZE/BANDAGES/DRESSINGS) ×1
DERMABOND ADVANCED .7 DNX12 (GAUZE/BANDAGES/DRESSINGS) ×1 IMPLANT
ELECT CAUTERY BLADE 6.4 (BLADE) ×1 IMPLANT
ELECT REM PT RETURN 9FT ADLT (ELECTROSURGICAL) ×1
ELECTRODE REM PT RTRN 9FT ADLT (ELECTROSURGICAL) ×1 IMPLANT
GAUZE 4X4 16PLY ~~LOC~~+RFID DBL (SPONGE) IMPLANT
GLOVE SURG SYN 7.0 (GLOVE) ×1 IMPLANT
GLOVE SURG SYN 7.0 PF PI (GLOVE) ×1 IMPLANT
GLOVE SURG SYN 7.5  E (GLOVE) ×1
GLOVE SURG SYN 7.5 E (GLOVE) ×1 IMPLANT
GLOVE SURG SYN 7.5 PF PI (GLOVE) ×1 IMPLANT
GOWN STRL REUS W/ TWL LRG LVL3 (GOWN DISPOSABLE) ×2 IMPLANT
GOWN STRL REUS W/TWL LRG LVL3 (GOWN DISPOSABLE) ×2
GRASPER SUT TROCAR 14GX15 (MISCELLANEOUS) IMPLANT
IRRIGATION STRYKERFLOW (MISCELLANEOUS) IMPLANT
IRRIGATOR STRYKERFLOW (MISCELLANEOUS) ×1
IV NS 1000ML (IV SOLUTION) ×2
IV NS 1000ML BAXH (IV SOLUTION) ×1 IMPLANT
KIT TURNOVER KIT A (KITS) ×1 IMPLANT
LABEL OR SOLS (LABEL) ×1 IMPLANT
MANIFOLD NEPTUNE II (INSTRUMENTS) ×1 IMPLANT
MINICAP W/POVIDONE IODINE SOL (MISCELLANEOUS) ×1 IMPLANT
NDL INSUFFLATION 14GA 120MM (NEEDLE) ×1 IMPLANT
NEEDLE HYPO 22GX1.5 SAFETY (NEEDLE) ×1 IMPLANT
NEEDLE INSUFFLATION 14GA 120MM (NEEDLE) ×1 IMPLANT
NS IRRIG 500ML POUR BTL (IV SOLUTION) ×1 IMPLANT
PACK LAP CHOLECYSTECTOMY (MISCELLANEOUS) ×1 IMPLANT
SCISSORS METZENBAUM CVD 33 (INSTRUMENTS) IMPLANT
SET CYSTO W/LG BORE CLAMP LF (SET/KITS/TRAYS/PACK) ×1 IMPLANT
SET TRANSFER 6 W/TWIST CLAMP 5 (SET/KITS/TRAYS/PACK) IMPLANT
SET TUBE SMOKE EVAC HIGH FLOW (TUBING) ×1 IMPLANT
SLEEVE ADV FIXATION 5X100MM (TROCAR) ×2 IMPLANT
SPONGE DRAIN TRACH 4X4 STRL 2S (GAUZE/BANDAGES/DRESSINGS) ×1 IMPLANT
STYLET FALLER (MISCELLANEOUS) IMPLANT
STYLET FALLER MEDIONICS (MISCELLANEOUS) IMPLANT
SUT ETHILON 2 0 FS 18 (SUTURE) ×1 IMPLANT
SUT MNCRL 4-0 (SUTURE) ×1
SUT MNCRL 4-0 27XMFL (SUTURE) ×1
SUT PROLENE 2 0 FS (SUTURE) IMPLANT
SUT PROLENE 2-0 (SUTURE) ×1
SUT PROLENE 2-0 RB1 36X2 ARM (SUTURE) ×1
SUT VIC AB 3-0 SH 27 (SUTURE) ×1
SUT VIC AB 3-0 SH 27X BRD (SUTURE) ×1 IMPLANT
SUT VICRYL 0 AB UR-6 (SUTURE) ×1 IMPLANT
SUTURE MNCRL 4-0 27XMF (SUTURE) ×1 IMPLANT
SUTURE PROLEN 2-0 RB1 36X2 ARM (SUTURE) IMPLANT
SYR BULB IRRIG 60ML STRL (SYRINGE) IMPLANT
SYS KII FIOS ACCESS ABD 5X100 (TROCAR)
SYSTEM KII FIOS ACES ABD 5X100 (TROCAR) ×1 IMPLANT
TRAP FLUID SMOKE EVACUATOR (MISCELLANEOUS) ×1 IMPLANT
TROCAR KII BLADELESS 5X150 (TROCAR) ×1 IMPLANT
WATER STERILE IRR 500ML POUR (IV SOLUTION) ×1 IMPLANT

## 2022-04-14 NOTE — Anesthesia Postprocedure Evaluation (Signed)
Anesthesia Post Note  Patient: [name] [name]  Procedure(s) Performed: LAPAROSCOPIC ASSISTED CONTINUOUS AMBULATORY PERITONEAL DIALYSIS  (CAPD) CATHETER REMOVAL (Abdomen)  Patient location during evaluation: PACU Anesthesia Type: General Level of consciousness: awake and alert, oriented and patient cooperative Pain management: pain level controlled Vital Signs Assessment: post-procedure vital signs reviewed and stable Respiratory status: spontaneous breathing, nonlabored ventilation and respiratory function stable Cardiovascular status: blood pressure returned to baseline and stable Postop Assessment: adequate PO intake Anesthetic complications: no   No notable events documented.   Last Vitals:  Vitals:   04/14/22 1645 04/14/22 1657  BP: (!) 125/56   Pulse: 76 79  Resp: (!) 9 19  Temp: (!) 36.1 C   SpO2: 99% 100%    Last Pain:  Vitals:   04/14/22 1645  TempSrc:   PainSc: 0-No pain                 Darrin Nipper

## 2022-04-14 NOTE — Assessment & Plan Note (Signed)
-  Nephrology consult to be obtained for follow-up. - He will need hemodialysis may be in the morning.  He has a right chest wall catheter.

## 2022-04-14 NOTE — Assessment & Plan Note (Signed)
-   We will place him on IV Rocephin and follow urine culture and sensitivity.

## 2022-04-14 NOTE — Procedures (Signed)
   HEMODIALYSIS TREATMENT NOTE:   HD delayed due to busy transport service.  Arrived to [facility] in bed A/Ox3.  PD catheter removed earlier today. Abdomen soft, tender, non-distended.  Incisions c/d/I.  Discomfort is tolerable with ice pack, per pt.  Consent for HD obtained.  Cath entry site unremarkable.  Dressing changed per policy.   Uneventful 3.5 hour heparin-free treatment completed.  Goal met: 1.5 liters removed without interruption in UF.  No meds given.  Hgb is trending down however nephrologist noted pt is on Mircera as outpatient. All blood was returned.  Post HD v/s: 132/65, p70, 75.8kg.  Report called by Terence Lux to primary nurse (ortho).    Rockwell Alexandria, RN

## 2022-04-14 NOTE — Assessment & Plan Note (Signed)
-   We will continue continue his statin therapy.

## 2022-04-14 NOTE — Progress Notes (Signed)
Discussed with nephrology service.  Patient's peritoneal dialysis catheter surgery was delayed until this afternoon.  Will still be done and the patient will undergo hemodialysis later today however he will not be safe for discharge by tonight.  Discussed with patient's daughter Siyon Linck [phone] on 8/25.  Explained my recommendation for the patient to stay overnight.  She is in agreement.  Ralene Muskrat MD  No charge

## 2022-04-14 NOTE — Anesthesia Procedure Notes (Signed)
Procedure Name: Intubation Date/Time: 04/14/2022 1:14 PM  Performed by: Lily Peer, Anjali Manzella, CRNAPre-anesthesia Checklist: Patient identified, Emergency Drugs available, Suction available and Patient being monitored Patient Re-evaluated:Patient Re-evaluated prior to induction Oxygen Delivery Method: Circle system utilized Preoxygenation: Pre-oxygenation with 100% oxygen Induction Type: IV induction Ventilation: Mask ventilation without difficulty Laryngoscope Size: McGraph and 4 Grade View: Grade I Tube type: Oral Tube size: 7.0 mm Number of attempts: 1 Airway Equipment and Method: Stylet Placement Confirmation: ETT inserted through vocal cords under direct vision, positive ETCO2 and breath sounds checked- equal and bilateral Secured at: 21 cm Tube secured with: Tape Dental Injury: Teeth and Oropharynx as per pre-operative assessment

## 2022-04-14 NOTE — Transfer of Care (Signed)
Immediate Anesthesia Transfer of Care Note  Patient: [name] [name]  Procedure(s) Performed: LAPAROSCOPIC ASSISTED CONTINUOUS AMBULATORY PERITONEAL DIALYSIS  (CAPD) CATHETER REMOVAL (Abdomen)  Patient Location: PACU  Anesthesia Type:General  Level of Consciousness: awake and alert   Airway & Oxygen Therapy: Patient Spontanous Breathing and Patient connected to face mask oxygen  Post-op Assessment: Report given to RN and Post -op Vital signs reviewed and stable  Post vital signs: Reviewed and stable  Last Vitals:  Vitals Value Taken Time  BP 131/56 04/14/22 1620  Temp    Pulse 83 04/14/22 1622  Resp    SpO2 100 % 04/14/22 1622  Vitals shown include unvalidated device data.  Last Pain:  Vitals:   04/14/22 1240  TempSrc: Temporal  PainSc:       Patients Stated Pain Goal: 0 (21/03/12 8118)  Complications: No notable events documented.

## 2022-04-14 NOTE — Progress Notes (Signed)
[facility]  ROUNDING NOTE   Subjective:   [name] [name] is a 81 year old male with past medical history including gout, hyperlipidemia, hypertension, and end-stage renal disease on hemodialysis.  Patient presents to the emergency department with complaints of abdominal pain for.  Patient has been admitted for Generalized abdominal pain [R10.84] ESRD (end stage renal disease) on dialysis ([place]) [N18.6, Z99.2] ESRD on hemodialysis ([place]) [N18.6, Z99.2] Malposition of peritoneal dialysis catheter [facility] [D35.701X] Peritoneal dialysis catheter dysfunction ([place]) [B93.903E]  Patient is known to our practice and currently receives outpatient hemodialysis treatments at [facility] on a MWF schedule, supervised by Dr. Holley Raring.  Patient states he began to have abdominal pain yesterday.  Denies any heavy lifting or awkward movements.  Continues to receive outpatient hemodialysis treatments until PD catheter ready for use.  Denies nausea and vomiting.  Denies chest pain or discomfort.  Denies cough or shortness of breath.  No known fever or chills.  Currently n.p.o. for procedure.  Labs on admission unremarkable for renal patient.  CT abdomen pelvis shows disconnected PD catheter tubing.  Surgery consulted.  We have been consulted to manage dialysis needs   Objective:  Vital signs in last 24 hours:  Temp:  [97.5 F (36.4 C)-98.4 F (36.9 C)] 97.5 F (36.4 C) (08/25 1625) Pulse Rate:  [56-81] 76 (08/25 1645) Resp:  [9-21] 9 (08/25 1645) BP: (114-157)/(55-73) 125/56 (08/25 1645) SpO2:  [98 %-100 %] 99 % (08/25 1645) Weight:  [81.6 kg] 81.6 kg (08/25 1243)  Weight change:  Filed Weights   04/13/22 1924 04/14/22 1243  Weight: 81.6 kg 81.6 kg    Intake/Output: I/O last 3 completed shifts: In: -  Out: 300 [Urine:300]   Intake/Output this shift:  Total I/O In: 200 [I.V.:200] Out: 10 [Blood:10]  Physical Exam: General: NAD, resting comfortably  Head: Normocephalic,  atraumatic. Moist oral mucosal membranes  Eyes: Anicteric  Lungs:  Clear to auscultation, normal effort  Heart: Regular rate and rhythm sinus bradycardia  Abdomen:  Soft, nontender, PD catheter  Extremities: No peripheral edema.  Neurologic: Nonfocal, moving all four extremities  Skin: No lesions  Access: Right chest PermCath, PD catheter    Basic Metabolic Panel: Recent Labs  Lab 04/08/22 0528 04/13/22 1926 04/14/22 0430  NA 138 140 142  K 3.7 3.3* 3.2*  CL 103 102 109  CO2 '28 28 27  '$ GLUCOSE 95 112* 110*  BUN '22 17 17  '$ CREATININE 4.21* 4.13* 4.54*  CALCIUM 8.5* 8.8* 8.8*  MG 1.8  --   --   PHOS 3.2  --   --      Liver Function Tests: Recent Labs  Lab 04/08/22 0528 04/13/22 1926  AST 14* 15  ALT 7 5  ALKPHOS 72 84  BILITOT 0.5 0.5  PROT 5.9* 6.9  ALBUMIN 3.0* 3.5    Recent Labs  Lab 04/13/22 1926  LIPASE 45   No results for input(s): "AMMONIA" in the last 168 hours.  CBC: Recent Labs  Lab 04/08/22 0528 04/13/22 1926 04/14/22 0430  WBC 7.9 6.7 7.0  NEUTROABS 4.3 3.3  --   HGB 8.5* 8.0* 7.7*  HCT 25.7* 24.5* 23.0*  MCV 88.6 89.7 88.5  PLT 109* 114* 97*     Cardiac Enzymes: No results for input(s): "CKTOTAL", "CKMB", "CKMBINDEX", "TROPONINI" in the last 168 hours.  BNP: Invalid input(s): "POCBNP"  CBG: No results for input(s): "GLUCAP" in the last 168 hours.   Microbiology: Results for orders placed or performed during the hospital encounter of  11/27/21  Urine Culture     Status: None   Collection Time: 11/27/21  2:45 PM   Specimen: Urine, Random  Result Value Ref Range Status   Specimen Description   Final    URINE, RANDOM Performed at [facility], [address] [place], [place] [address]    Special Requests   Final    NONE Performed at [facility], [address] [place], [place] [address]    Culture   Final    NO GROWTH Performed at [facility], [place] [address] [place], [place] [address]     Report Status 11/29/2021 FINAL  Final    Coagulation Studies: No results for input(s): "LABPROT", "INR" in the last 72 hours.  Urinalysis: Recent Labs    04/14/22 0011  COLORURINE STRAW*  LABSPEC 1.006  PHURINE 9.0*  GLUCOSEU NEGATIVE  HGBUR LARGE*  BILIRUBINUR NEGATIVE  KETONESUR NEGATIVE  PROTEINUR 100*  NITRITE NEGATIVE  LEUKOCYTESUR NEGATIVE      Imaging: CT ABDOMEN PELVIS WO CONTRAST  Result Date: 04/14/2022 CLINICAL DATA:  Abdominal pain.  Concern for bowel obstruction. EXAM: CT ABDOMEN AND PELVIS WITHOUT CONTRAST TECHNIQUE: Multidetector CT imaging of the abdomen and pelvis was performed following the standard protocol without IV contrast. RADIATION DOSE REDUCTION: This exam was performed according to the departmental dose-optimization program which includes automated exposure control, adjustment of the mA and/or kV according to patient size and/or use of iterative reconstruction technique. COMPARISON:  CT abdomen pelvis dated 11/27/2021. FINDINGS: Evaluation of this exam is limited in the absence of intravenous contrast. Lower chest: There is a 4 mm right middle lobe nodule and a 4 mm right lower lobe nodule seen on the prior CT. The visualized lung bases are otherwise clear. No intra-abdominal free air or free fluid. Hepatobiliary: No focal liver abnormality is seen. No gallstones, gallbladder wall thickening, or biliary dilatation. Pancreas: Unremarkable. No pancreatic ductal dilatation or surrounding inflammatory changes. Spleen: Normal in size without focal abnormality. Adrenals/Urinary Tract: The adrenal glands are unremarkable. There is mild bilateral renal parenchyma atrophy. The right kidney is located in the pelvis. There is a 4.5 x 4.0 cm heterogeneous mass in the interpolar left kidney which is not characterized on this CT and better evaluated on the prior abdominal MRI. Several additional bilateral renal cysts noted. There is no hydronephrosis or nephrolithiasis on  either side. The visualized ureters and urinary bladder appear unremarkable. Stomach/Bowel: There is moderate stool throughout the colon. There is no bowel obstruction or active inflammation. The appendix is not visualized with certainty. No inflammatory changes identified in the right lower quadrant. Vascular/Lymphatic: Moderate aortoiliac atherosclerotic disease. The IVC is unremarkable no portal venous gas. There is no adenopathy. Reproductive: The prostate and seminal vesicles are grossly unremarkable. No pelvic mass. Other: Small fat containing right inguinal hernia. There is diffuse subcutaneous stranding and edema. There is a percutaneous catheter with tip in the left lower anterior pelvis. There is discontinuity of the catheter within the subcutaneous soft tissues of the right anterior pelvic wall at the level of the connection. Recommend re-evaluation and reconnecting. No drainable fluid collection or abscess identified. Musculoskeletal: Degenerative changes of the spine and hips. No acute osseous pathology. IMPRESSION: 1. No acute intra-abdominal or pelvic pathology. No bowel obstruction. 2. Disconnected percutaneous catheter tubing in the right anterior pelvic wall. Recommend re-evaluation and reconnecting. No drainable fluid collection or abscess. 3. Aortic Atherosclerosis (ICD10-I70.0). Electronically Signed   By: Anner Crete M.D.   On: 04/14/2022 01:32  Medications:    anticoagulant sodium citrate      [MAR Hold] amLODipine  10 mg Oral Q breakfast   [MAR Hold] bicalutamide  50 mg Oral Q breakfast   [MAR Hold] Chlorhexidine Gluconate Cloth  6 each Topical Q0600   [MAR Hold] cinacalcet  30 mg Oral Q breakfast   [MAR Hold] enalapril  20 mg Oral BID   [MAR Hold] febuxostat  40 mg Oral Q breakfast   [MAR Hold] heparin  5,000 Units Subcutaneous Q8H   [MAR Hold] hydrALAZINE  100 mg Oral TID   [MAR Hold] metoprolol tartrate  50 mg Oral BID   [MAR Hold] polyethylene glycol  17 g Oral  Once per day on Mon Wed Fri   [facility] Hold] pravastatin  20 mg Oral q1800   [MAR Hold] sodium bicarbonate  650 mg Oral BID   [MAR Hold] acetaminophen **OR** [MAR Hold] acetaminophen, alteplase, anticoagulant sodium citrate, [MAR Hold] azelastine, fentaNYL (SUBLIMAZE) injection, [MAR Hold] fentaNYL (SUBLIMAZE) injection, [MAR Hold] fluticasone, heparin, lidocaine (PF), lidocaine-prilocaine, [MAR Hold] magnesium hydroxide, [MAR Hold] ondansetron **OR** [MAR Hold] ondansetron (ZOFRAN) IV, [MAR Hold] oxyCODONE, pentafluoroprop-tetrafluoroeth, [MAR Hold] traZODone  Assessment/ Plan:  [name] is a 81 y.o.  male with past medical history including gout, hyperlipidemia, hypertension, and end-stage renal disease on hemodialysis.  Patient presents to the hospital for scheduled surgery to evaluate and possibly replace malfunctioning PD catheter.  Patient has been admitted under observation for Generalized abdominal pain [R10.84] ESRD (end stage renal disease) on dialysis ([place]) [N18.6, Z99.2] ESRD on hemodialysis ([place]) [N18.6, Z99.2] Malposition of peritoneal dialysis catheter ([place]) [B71.696V] Peritoneal dialysis catheter dysfunction ([place]) [T85.611A]  [facility]/MWF/right chest PermCath  Anemia of chronic kidney disease Lab Results  Component Value Date   HGB 7.7 (L) 04/14/2022   Hemoglobin below desired target.  Patient receives Mircera patient.  2.  End-stage renal disease on hemodialysis.  Patient receives backup hemodialysis at [facility] until PD catheter ready for use. CT abd shows [place] disconnected. Awaiting surgery for repair. Will provide dialysis treatment once surgery complete.  3. Secondary Hyperparathyroidism: with outpatient labs: PTH 271, phosphorus 5.3, calcium 8.0 on 03/02/2022.   Lab Results  Component Value Date   CALCIUM 8.8 (L) 04/14/2022   CAION 1.11 (L) 04/05/2022   PHOS 3.2 04/08/2022     We will continue to monitor bone minerals during this admission  4.   Hypertension with chronic kidney disease.  Home regimen includes amlodipine, enalapril, hydralazine, and metoprolol.  Currently receiving these medications.  Blood pressure well controlled, 125/56     LOS: 0   8/25/20234:55 PM

## 2022-04-14 NOTE — Consult Note (Signed)
Date of Consultation:  04/14/2022  Requesting Physician:  Eugenie Norrie, MD  Reason for Consultation:  Peritoneal dialysis catheter malfunction  History of Present Illness: [name] [name] is a 81 y.o. male with history of ESRD on dialysis.  He had a PD catheter placed on 11/23/21, which needed revision on 01/31/22.  This worked for a while and then stopped working again, and he was taken to [facility] for removal of the previous catheter and placement of new right sided PD catheter on 04/04/22.  He presented to the ED last night not feeling well and some abdominal discomfort.  CT scan of abdomen/pelvis showed that the tube had become disconnected at the titanium connector in the subcutaneous tissue.    Past Medical History: Past Medical History:  Diagnosis Date   Anemia    Arthritis    Cancer ([place])    Chronic kidney disease    Stage 4 CKD   GERD (gastroesophageal reflux disease)    h/o   Gout    Hyperlipidemia, mixed    Hypertension    Sleep apnea    h/o no cpap     Past Surgical History: Past Surgical History:  Procedure Laterality Date   APPENDECTOMY     Bunion Removal Right    CAPD INSERTION N/A 11/23/2021   Procedure: LAPAROSCOPIC INSERTION CONTINUOUS AMBULATORY PERITONEAL DIALYSIS  (CAPD) CATHETER;  Surgeon: Olean Ree, MD;  Location: [facility] ORS;  Service: General;  Laterality: N/A;   CAPD REVISION N/A 01/31/2022   Procedure: LAPAROSCOPIC REVISION CONTINUOUS AMBULATORY PERITONEAL DIALYSIS  (CAPD) CATHETER;  Surgeon: Olean Ree, MD;  Location: [facility] ORS;  Service: General;  Laterality: N/A;   CAPD REVISION N/A 04/06/2022   Procedure: LAPAROSCOPIC REVISION CONTINUOUS AMBULATORY PERITONEAL DIALYSIS  (CAPD) CATHETER, replacement;  Surgeon: Olean Ree, MD;  Location: [facility] ORS;  Service: General;  Laterality: N/A;   COLONOSCOPY WITH PROPOFOL N/A 06/14/2015   Procedure: COLONOSCOPY WITH PROPOFOL;  Surgeon: Lollie Sails, MD;  Location: [facility];  Service: Endoscopy;  Laterality:  N/A;   DIALYSIS/PERMA CATHETER INSERTION N/A 10/03/2021   Procedure: DIALYSIS/PERMA CATHETER INSERTION;  Surgeon: Algernon Huxley, MD;  Location: [facility];  Service: Cardiovascular;  Laterality: N/A;   EYE SURGERY     HERNIA REPAIR     abd   INSERTION OF MESH  11/23/2021   Procedure: INSERTION OF MESH;  Surgeon: Olean Ree, MD;  Location: [facility] ORS;  Service: General;;   PROSTATE BIOPSY N/A 03/27/2017   Procedure: PROSTATE BIOPSY-URO NAV;  Surgeon: Royston Cowper, MD;  Location: [facility] ORS;  Service: Urology;  Laterality: N/A;   PROSTATE BIOPSY N/A 09/11/2019   Procedure: PROSTATE BIOPSY Pearson Forster;  Surgeon: Royston Cowper, MD;  Location: [facility] ORS;  Service: Urology;  Laterality: N/A;   RETINAL DETACHMENT SURGERY Left    TRANSURETHRAL RESECTION OF PROSTATE     XI ROBOTIC ASSISTED VENTRAL HERNIA N/A 11/23/2021   Procedure: XI ROBOTIC ASSISTED VENTRAL HERNIA, incisional;  Surgeon: Olean Ree, MD;  Location: [facility] ORS;  Service: General;  Laterality: N/A;    Home Medications: Prior to Admission medications   Medication Sig Start Date End Date Taking? Authorizing Provider  amLODipine (NORVASC) 10 MG tablet Take 10 mg by mouth every morning.   Yes [provider]  bicalutamide (CASODEX) 50 MG tablet Take 50 mg by mouth every morning. 10/23/19  Yes [provider]  cinacalcet (SENSIPAR) 30 MG tablet Take 30 mg by mouth daily.   Yes [provider]  enalapril (VASOTEC)  20 MG tablet Take 20 mg by mouth 2 (two) times daily.    Yes [provider]  hydrALAZINE (APRESOLINE) 100 MG tablet Take 100 mg by mouth 3 (three) times daily.   Yes [provider]  lovastatin (MEVACOR) 20 MG tablet Take 20 mg by mouth at bedtime.   Yes [provider]  metoprolol (LOPRESSOR) 50 MG tablet Take 50 mg by mouth 2 (two) times daily.   Yes [provider]  polyethylene glycol (MIRALAX / GLYCOLAX) 17 g packet Take 17 g by mouth 3 (three) times  a week.   Yes [provider]  sodium bicarbonate 650 MG tablet Take 650 mg by mouth 2 (two) times daily.    Yes [provider]  ULORIC 40 MG tablet Take 40 mg by mouth every morning. 03/09/17  Yes [provider]  acetaminophen (TYLENOL) 500 MG tablet Take 2 tablets (1,000 mg total) by mouth every 6 (six) hours as needed for mild pain. 01/31/22   Olean Ree, MD  azelastine (ASTELIN) 0.1 % nasal spray Place 1 spray into both nostrils 2 (two) times daily as needed for rhinitis. Use in each nostril as directed    [provider]  fluticasone (FLONASE) 50 MCG/ACT nasal spray Place 1 spray into both nostrils daily as needed for allergies or rhinitis.    [provider]  ondansetron (ZOFRAN) 4 MG tablet Take 1 tablet (4 mg total) by mouth every 6 (six) hours as needed for nausea. 04/08/22   Allie Bossier, MD  oxyCODONE (OXY IR/ROXICODONE) 5 MG immediate release tablet Take 1-2 tablets (5-10 mg total) by mouth every 4 (four) hours as needed for moderate pain or severe pain. 04/08/22   Allie Bossier, MD    Allergies: Allergies  Allergen Reactions   Fish Allergy Swelling   Ciprofloxacin     Unknown reaction   Doxazosin Other (See Comments)    unknown    Social History:  reports that he has never smoked. He has never used smokeless tobacco. He reports that he does not drink alcohol and does not use drugs.   Family History: Family History  Problem Relation Age of Onset   Leukemia Father    Leukemia Brother     Review of Systems: Review of Systems  Constitutional:  Positive for malaise/fatigue. Negative for chills and fever.  Respiratory:  Negative for shortness of breath.   Cardiovascular:  Negative for chest pain.  Gastrointestinal:  Positive for abdominal pain.  Genitourinary:  Negative for dysuria.  Musculoskeletal:  Negative for myalgias.    Physical Exam BP (!) 143/66 (BP Location: Right Arm)   Pulse 65   Temp 98.4 F (36.9 C)    Resp 16   Ht '5\' 9"'$  (1.753 m)   Wt 81.6 kg   SpO2 100%   BMI 26.58 kg/m  CONSTITUTIONAL: No acute distress HEENT:  Normocephalic, atraumatic, extraocular motion intact. NECK: Trachea is midline, and there is no jugular venous distension. RESPIRATORY:  Normal respiratory effort without pathologic use of accessory muscles. CARDIOVASCULAR: Regular rhythm and rate GI: The abdomen is soft, non-distended, with some discomfort in the periumbilical region, where the catheter is disconnected.  No evidence of infection but there is some swelling of the soft tissue.  MUSCULOSKELETAL:  Normal muscle strength and tone in all four extremities.  No peripheral edema or cyanosis. SKIN: Skin turgor is normal. There are no pathologic skin lesions.  NEUROLOGIC:  Motor and sensation is grossly normal.  Cranial nerves  are grossly intact. PSYCH:  Alert and oriented to person, place and time. Affect is normal.  Laboratory Analysis: Results for orders placed or performed during the hospital encounter of 04/13/22 (from the past 24 hour(s))  CBC with Differential     Status: Abnormal   Collection Time: 04/13/22  7:26 PM  Result Value Ref Range   WBC 6.7 4.0 - 10.5 K/uL   RBC 2.73 (L) 4.22 - 5.81 MIL/uL   Hemoglobin 8.0 (L) 13.0 - 17.0 g/dL   HCT 24.5 (L) 39.0 - 52.0 %   MCV 89.7 80.0 - 100.0 fL   MCH 29.3 26.0 - 34.0 pg   MCHC 32.7 30.0 - 36.0 g/dL   RDW 15.9 (H) 11.5 - 15.5 %   Platelets 114 (L) 150 - 400 K/uL   nRBC 0.0 0.0 - 0.2 %   Neutrophils Relative % 49 %   Neutro Abs 3.3 1.7 - 7.7 K/uL   Lymphocytes Relative 35 %   Lymphs Abs 2.3 0.7 - 4.0 K/uL   Monocytes Relative 10 %   Monocytes Absolute 0.7 0.1 - 1.0 K/uL   Eosinophils Relative 6 %   Eosinophils Absolute 0.4 0.0 - 0.5 K/uL   Basophils Relative 0 %   Basophils Absolute 0.0 0.0 - 0.1 K/uL   Immature Granulocytes 0 %   Abs Immature Granulocytes 0.02 0.00 - 0.07 K/uL  Comprehensive metabolic panel     Status: Abnormal   Collection Time:  04/13/22  7:26 PM  Result Value Ref Range   Sodium 140 135 - 145 mmol/L   Potassium 3.3 (L) 3.5 - 5.1 mmol/L   Chloride 102 98 - 111 mmol/L   CO2 28 22 - 32 mmol/L   Glucose, Bld 112 (H) 70 - 99 mg/dL   BUN 17 8 - 23 mg/dL   Creatinine, Ser 4.13 (H) 0.61 - 1.24 mg/dL   Calcium 8.8 (L) 8.9 - 10.3 mg/dL   Total Protein 6.9 6.5 - 8.1 g/dL   Albumin 3.5 3.5 - 5.0 g/dL   AST 15 15 - 41 U/L   ALT 5 0 - 44 U/L   Alkaline Phosphatase 84 38 - 126 U/L   Total Bilirubin 0.5 0.3 - 1.2 mg/dL   GFR, Estimated 14 (L) >60 mL/min   Anion gap 10 5 - 15  Lipase, blood     Status: None   Collection Time: 04/13/22  7:26 PM  Result Value Ref Range   Lipase 45 11 - 51 U/L  Urinalysis, Routine w reflex microscopic     Status: Abnormal   Collection Time: 04/14/22 12:11 AM  Result Value Ref Range   Color, Urine STRAW (A) YELLOW   APPearance HAZY (A) CLEAR   Specific Gravity, Urine 1.006 1.005 - 1.030   pH 9.0 (H) 5.0 - 8.0   Glucose, UA NEGATIVE NEGATIVE mg/dL   Hgb urine dipstick LARGE (A) NEGATIVE   Bilirubin Urine NEGATIVE NEGATIVE   Ketones, ur NEGATIVE NEGATIVE mg/dL   Protein, ur 100 (A) NEGATIVE mg/dL   Nitrite NEGATIVE NEGATIVE   Leukocytes,Ua NEGATIVE NEGATIVE   RBC / HPF >50 (H) 0 - 5 RBC/hpf   WBC, UA 11-20 0 - 5 WBC/hpf   Bacteria, UA RARE (A) NONE SEEN   Squamous Epithelial / LPF 0-5 0 - 5  Basic metabolic panel     Status: Abnormal   Collection Time: 04/14/22  4:30 AM  Result Value Ref Range   Sodium 142 135 - 145 mmol/L   Potassium 3.2 (  L) 3.5 - 5.1 mmol/L   Chloride 109 98 - 111 mmol/L   CO2 27 22 - 32 mmol/L   Glucose, Bld 110 (H) 70 - 99 mg/dL   BUN 17 8 - 23 mg/dL   Creatinine, Ser 4.54 (H) 0.61 - 1.24 mg/dL   Calcium 8.8 (L) 8.9 - 10.3 mg/dL   GFR, Estimated 12 (L) >60 mL/min   Anion gap 6 5 - 15  CBC     Status: Abnormal   Collection Time: 04/14/22  4:30 AM  Result Value Ref Range   WBC 7.0 4.0 - 10.5 K/uL   RBC 2.60 (L) 4.22 - 5.81 MIL/uL   Hemoglobin 7.7 (L)  13.0 - 17.0 g/dL   HCT 23.0 (L) 39.0 - 52.0 %   MCV 88.5 80.0 - 100.0 fL   MCH 29.6 26.0 - 34.0 pg   MCHC 33.5 30.0 - 36.0 g/dL   RDW 15.9 (H) 11.5 - 15.5 %   Platelets 97 (L) 150 - 400 K/uL   nRBC 0.0 0.0 - 0.2 %    Imaging: CT ABDOMEN PELVIS WO CONTRAST  Result Date: 04/14/2022 CLINICAL DATA:  Abdominal pain.  Concern for bowel obstruction. EXAM: CT ABDOMEN AND PELVIS WITHOUT CONTRAST TECHNIQUE: Multidetector CT imaging of the abdomen and pelvis was performed following the standard protocol without IV contrast. RADIATION DOSE REDUCTION: This exam was performed according to the departmental dose-optimization program which includes automated exposure control, adjustment of the mA and/or kV according to patient size and/or use of iterative reconstruction technique. COMPARISON:  CT abdomen pelvis dated 11/27/2021. FINDINGS: Evaluation of this exam is limited in the absence of intravenous contrast. Lower chest: There is a 4 mm right middle lobe nodule and a 4 mm right lower lobe nodule seen on the prior CT. The visualized lung bases are otherwise clear. No intra-abdominal free air or free fluid. Hepatobiliary: No focal liver abnormality is seen. No gallstones, gallbladder wall thickening, or biliary dilatation. Pancreas: Unremarkable. No pancreatic ductal dilatation or surrounding inflammatory changes. Spleen: Normal in size without focal abnormality. Adrenals/Urinary Tract: The adrenal glands are unremarkable. There is mild bilateral renal parenchyma atrophy. The right kidney is located in the pelvis. There is a 4.5 x 4.0 cm heterogeneous mass in the interpolar left kidney which is not characterized on this CT and better evaluated on the prior abdominal MRI. Several additional bilateral renal cysts noted. There is no hydronephrosis or nephrolithiasis on either side. The visualized ureters and urinary bladder appear unremarkable. Stomach/Bowel: There is moderate stool throughout the colon. There is no  bowel obstruction or active inflammation. The appendix is not visualized with certainty. No inflammatory changes identified in the right lower quadrant. Vascular/Lymphatic: Moderate aortoiliac atherosclerotic disease. The IVC is unremarkable no portal venous gas. There is no adenopathy. Reproductive: The prostate and seminal vesicles are grossly unremarkable. No pelvic mass. Other: Small fat containing right inguinal hernia. There is diffuse subcutaneous stranding and edema. There is a percutaneous catheter with tip in the left lower anterior pelvis. There is discontinuity of the catheter within the subcutaneous soft tissues of the right anterior pelvic wall at the level of the connection. Recommend re-evaluation and reconnecting. No drainable fluid collection or abscess identified. Musculoskeletal: Degenerative changes of the spine and hips. No acute osseous pathology. IMPRESSION: 1. No acute intra-abdominal or pelvic pathology. No bowel obstruction. 2. Disconnected percutaneous catheter tubing in the right anterior pelvic wall. Recommend re-evaluation and reconnecting. No drainable fluid collection or abscess. 3. Aortic Atherosclerosis (ICD10-I70.0). Electronically Signed  By: Anner Crete M.D.   On: 04/14/2022 01:32    Assessment and Plan: This is a 81 y.o. male with peritoneal dialysis catheter malfunction  --Discussed with the patient the findings on the CT scan and that the titanium connector has disconnected from the tube.  This needs to be reconnected, and discussed with him the need for going to the OR so we can do this under anesthesia and potentially also evaluate the catheter via laparoscopy to make sure it's not obstructed.  He's in agreement.  Will post case for this afternoon pending OR availability. --Keep NPO for now.  Will add preop abx.  I spent 60 minutes dedicated to the care of this patient on the date of this encounter to include pre-visit review of records, face-to-face time with  the patient discussing diagnosis and management, and any post-visit coordination of care.   Melvyn Neth, MD [facility]:  [phone]

## 2022-04-14 NOTE — Anesthesia Preprocedure Evaluation (Signed)
Anesthesia Evaluation  Patient identified by MRN, date of birth, ID band Patient awake    Reviewed: Allergy & Precautions, NPO status , Patient's Chart, lab work & pertinent test results  History of Anesthesia Complications Negative for: history of anesthetic complications  Airway Mallampati: III  TM Distance: >3 FB Neck ROM: full    Dental  (+) Chipped, Poor Dentition, Missing   Pulmonary neg shortness of breath, sleep apnea , neg recent URI,    Pulmonary exam normal        Cardiovascular Exercise Tolerance: Good hypertension, (-) anginaNormal cardiovascular exam     Neuro/Psych negative neurological ROS  negative psych ROS   GI/Hepatic Neg liver ROS, GERD  Controlled,  Endo/Other  diabetes, Type 2  Renal/GU DialysisRenal disease     Musculoskeletal   Abdominal   Peds  Hematology negative hematology ROS (+)   Anesthesia Other Findings Past Medical History: No date: Anemia No date: Arthritis No date: Cancer ([place]) No date: Chronic kidney disease     Comment:  Stage 4 CKD No date: GERD (gastroesophageal reflux disease)     Comment:  h/o No date: Gout No date: Hyperlipidemia, mixed No date: Hypertension No date: Sleep apnea     Comment:  h/o no cpap  Past Surgical History: No date: APPENDECTOMY No date: Bunion Removal; Right 11/23/2021: CAPD INSERTION; N/A     Comment:  Procedure: LAPAROSCOPIC INSERTION CONTINUOUS AMBULATORY               PERITONEAL DIALYSIS  (CAPD) CATHETER;  Surgeon: Olean Ree, MD;  Location: [facility] ORS;  Service: General;                Laterality: N/A; 01/31/2022: CAPD REVISION; N/A     Comment:  Procedure: LAPAROSCOPIC REVISION CONTINUOUS AMBULATORY               PERITONEAL DIALYSIS  (CAPD) CATHETER;  Surgeon: Olean Ree, MD;  Location: [facility] OR[facility];  Service: General;                Laterality: N/A; 06/14/2015: COLONOSCOPY WITH PROPOFOL; N/A      Comment:  Procedure: COLONOSCOPY WITH PROPOFOL;  Surgeon: Lollie Sails, MD;  Location: [facility];  Service:               Endoscopy;  Laterality: N/A; 10/03/2021: DIALYSIS/PERMA CATHETER INSERTION; N/A     Comment:  Procedure: DIALYSIS/PERMA CATHETER INSERTION;  Surgeon:               Algernon Huxley, MD;  Location: [place] [facility];                Service: Cardiovascular;  Laterality: N/A; No date: EYE SURGERY No date: HERNIA REPAIR     Comment:  abd 11/23/2021: INSERTION OF MESH     Comment:  Procedure: INSERTION OF MESH;  Surgeon: Olean Ree,               MD;  Location: [place] [facility];  Service: General;; 03/27/2017: PROSTATE BIOPSY; N/A     Comment:  Procedure: PROSTATE BIOPSY-URO NAV;  Surgeon: Royston Cowper, MD;  Location: [facility];  Service: Urology;  Laterality: N/A; 09/11/2019: PROSTATE BIOPSY; N/A     Comment:  Procedure: PROSTATE BIOPSY Pearson Forster;  Surgeon:               Royston Cowper, MD;  Location: [facility];  Service:               Urology;  Laterality: N/A; No date: RETINAL DETACHMENT SURGERY; Left No date: TRANSURETHRAL RESECTION OF PROSTATE 11/23/2021: XI ROBOTIC ASSISTED VENTRAL HERNIA; N/A     Comment:  Procedure: XI ROBOTIC ASSISTED VENTRAL HERNIA,               incisional;  Surgeon: Olean Ree, MD;  Location: [facility]               ORS;  Service: General;  Laterality: N/A;  BMI    Body Mass Index: 27.32 kg/m      Reproductive/Obstetrics negative OB ROS                             Anesthesia Physical  Anesthesia Plan  ASA: 3  Anesthesia Plan: General ETT   Post-op Pain Management:    Induction: Intravenous  PONV Risk Score and Plan: Ondansetron, Dexamethasone, Midazolam and Treatment may vary due to age or medical condition  Airway Management Planned: Oral ETT  Additional Equipment:   Intra-op Plan:   Post-operative Plan: Extubation in OR  Informed Consent: I  have reviewed the patients History and Physical, chart, labs and discussed the procedure including the risks, benefits and alternatives for the proposed anesthesia with the patient or authorized representative who has indicated his/her understanding and acceptance.     Dental Advisory Given  Plan Discussed with: Anesthesiologist, CRNA and Surgeon  Anesthesia Plan Comments: (Patient consented for risks of anesthesia including but not limited to:  - adverse reactions to medications - damage to eyes, teeth, lips or other oral mucosa - nerve damage due to positioning  - sore throat or hoarseness - Damage to heart, brain, nerves, lungs, other parts of body or loss of life  Patient voiced understanding.)        Anesthesia Quick Evaluation

## 2022-04-14 NOTE — OR Nursing (Signed)
Unsuccessful attempt to insert foley catheter intraoperatively x1. Foreskin returned to natural position.  Ladell Heads, RN

## 2022-04-14 NOTE — Assessment & Plan Note (Signed)
-   The patient will be placed on supplement coverage with NovoLog 

## 2022-04-14 NOTE — Plan of Care (Signed)
  Problem: Education: Goal: Knowledge of General Education information will improve Description: Including pain rating scale, medication(s)/side effects and non-pharmacologic comfort measures Outcome: Progressing   Problem: Activity: Goal: Risk for activity intolerance will decrease Outcome: Progressing   Problem: Nutrition: Goal: Adequate nutrition will be maintained Outcome: Progressing   Problem: Pain Managment: Goal: General experience of comfort will improve Outcome: Progressing   Problem: Elimination: Goal: Will not experience complications related to bowel motility Outcome: Progressing Goal: Will not experience complications related to urinary retention Outcome: Progressing   Problem: Safety: Goal: Ability to remain free from injury will improve Outcome: Progressing

## 2022-04-14 NOTE — Progress Notes (Signed)
Brief hospitalist update note.  This is a nonbillable note.  Please see scanned H&P for full billable details.  Briefly, this is an 81 year old male with history of recently placed peritoneal dialysis catheter who presents for chief complaint of abdominal pain.  Abdominal CT revealed disconnected percutaneous catheter tubing in the right anterior pelvic wall.  General surgery and nephrology on board.  Discussed with Dr. Hampton Abbot.  Plan to take the OR today to reconnect PD catheter.  Dialysis will also be completed today.  Discharge anticipated possibly later today if stable otherwise tomorrow 8/26.  Ralene Muskrat MD  No charge

## 2022-04-14 NOTE — Op Note (Signed)
Procedure Date:  04/14/2022  Pre-operative Diagnosis:  Peritoneal dialysis catheter malfunction  Post-operative Diagnosis:  Peritoneal dialysis catheter malfunction.  Procedure:  Laparoscopic assisted revision of peritoneal dialysis catheter Removal of peritoneal dialysis catheter  Surgeon:  Melvyn Neth, MD  Anesthesia:  General endotracheal  Estimated Blood Loss:  10 ml  Specimens:  None  Complications:  None  Indications for Procedure:  This is a 81 y.o. male with a history of prior PD catheter placement requiring revisions and eventual removal and placement of a new catheter.  Now this catheter is malfunctioning and on CT scan it was become disconnected in the subcutaneous tissue.  The risks of bleeding, abscess or infection, potential for an open procedure, catheter failure, and injury to surrounding structures were all discussed with the patient and he was willing to proceed.  Description of Procedure: The patient was correctly identified in the preoperative area and brought into the operating room.  The patient was placed supine with VTE prophylaxis in place.  Appropriate time-outs were performed.  Anesthesia was induced and the patient was intubated.  Appropriate antibiotics were infused.  The abdomen including existing PD catheter was prepped and draped in a sterile fashion.  We reopened the incision made in the right abdomen for catheter insertion and the tubing with connector was identified.  The distal component had broken off just distal to the titanium connector.  The connector was mobilized and the distal component was inserted into the connector again.  2-0 Prolene sutures were used to secure the catheter to the connector.  0 Vicryl suture was used to close around the cuff of the catheter again and the wound was then closed in layers using 3-0 Vicryl and 4-0 Monocryl.  At that point, we reopened his prior left sided port sites and two new ports were placed and  pneumoperitoneum was started.  The catheter distally had somewhat migrated towards the left lower quadrant instead of sitting in the pelvis.  I tried tacking the catheter down using transfascial 2-0 Prolene sutures.  After that, the catheter was tested with heparinized saline.  Fluid went in very smoothly, but it did not drain well and only 400 of the 1000 ml drained.  Looking inside again, the catheter had tried to move back to the left lower quadrant.  Again the catheter was manipulated but no further drainage was obtained.  We tried to fill in an additional 1000 ml of heparinized saline, and about 800 ml drained out.  However, again one could see fluid leftover in the pelvis, and also more fluid accumulating in the RUQ and LUQ, despite of the patient being in reverse trendelenburg position.    At this point, I scrubbed out and contacted the patient's daughter, Ms. Mauri Brooklyn.  We discussed the issues with the catheter and both agreed that we should remove the catheter altogether and be finished with further PD efforts.  As such, I obtained verbal consent from her to remove the catheter.  Suction irrigator was used to suction out the remainder of fluid from his abdomen and pelvis, and then ports were removed and pneumoperitoneum released.  The right abdominal catheter insertion site was reopened again and the catheter was pulled out completely.  Local anesthetic was infused in all incisions and all incisions were closed with 3-0 Vicryl and 4-0 Monocryl.   The catheter exit site was cleaned and dressed with large BandAid.  The patient was emerged from anesthesia and extubated and brought to the recovery room  for further management.  The patient tolerated the procedure well and all counts were correct at the end of the case.   Melvyn Neth, MD

## 2022-04-14 NOTE — TOC Initial Note (Signed)
Transition of Care [facility]) - Initial/Assessment Note    Patient Details  Name: [name] [name] MRN: [id number] Date of Birth: July 20, 1941  [facility] CM/SW Contact:    Conception Oms, RN Phone Number: 04/14/2022, 9:45 AM  Clinical Narrative:                  Transition of Care [facility]) Screening Note   Patient Details  Name: [name] [name] Date of Birth: [birth year]   Transition of Care [facility]) CM/SW Contact:    Conception Oms, RN Phone Number: 04/14/2022, 9:46 AM  Daughter provides transportation, Known dialysis patient  Transition of Care Department (TOC) has reviewed patient and no TOC needs have been identified at this time. We will continue to monitor patient advancement through interdisciplinary progression rounds. If new patient transition needs arise, please place a TOC consult.    Expected Discharge Plan: Home/Self Care Barriers to Discharge: Continued Medical Work up   Patient Goals and CMS Choice        Expected Discharge Plan and Services Expected Discharge Plan: Home/Self Care                                              Prior Living Arrangements/Services                       Activities of Daily Living Home Assistive Devices/Equipment: Cane (specify quad or straight) ADL Screening (condition at time of admission) Patient's cognitive ability adequate to safely complete daily activities?: No Is the patient deaf or have difficulty hearing?: No Does the patient have difficulty seeing, even when wearing glasses/contacts?: Yes Does the patient have difficulty concentrating, remembering, or making decisions?: Yes Patient able to express need for assistance with ADLs?: Yes Does the patient have difficulty dressing or bathing?: Yes Independently performs ADLs?: Yes (appropriate for developmental age) Does the patient have difficulty walking or climbing stairs?: Yes Weakness of Legs: Both Weakness of Arms/Hands:  None  Permission Sought/Granted                  Emotional Assessment              Admission diagnosis:  Generalized abdominal pain [R10.84] ESRD (end stage renal disease) on dialysis ([place]) [N18.6, Z99.2] ESRD on hemodialysis ([place]) [N18.6, Z99.2] Malposition of peritoneal dialysis catheter [facility]) [A25.053Z] Patient Active Problem List   Diagnosis Date Noted   ESRD (end stage renal disease) on dialysis ([place]) 04/14/2022   Gout 04/14/2022   Acute lower UTI 04/14/2022   Anemia 04/05/2022   PD catheter dysfunction ([place])    Hypokalemia 12/19/2021   ESRD on dialysis ([place])    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    Chronic kidney disease, stage V ([place]) 09/20/2021   Seasonal allergic rhinitis due to pollen 12/15/2020   Ventral hernia without obstruction or gangrene 12/15/2020   Proteinuria 06/14/2019   Secondary hyperparathyroidism of renal origin ([place]) 06/14/2019   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   Class 1 obesity due to excess calories with serious comorbidity and body mass index (BMI) of 34.0 to 34.9 in adult 03/28/2019   Benign essential hypertension 03/28/2019   History of gout 03/28/2019   Mixed hyperlipidemia 03/28/2019   Type 2 diabetes mellitus with chronic kidney disease ([place]) 03/28/2019   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[place]) 06/28/2016   Thrombocytopenia ([place])  05/07/2016   Chronic kidney disease, stage IV (severe) ([place]) 05/07/2016   PCP:  Dion Body, MD Pharmacy:   [facility] [address]), [place] [address] [place]) [place] [address] Phone: [phone] Fax: [phone]     Social Determinants of Health (SDOH) Interventions    Readmission Risk Interventions     No data to display

## 2022-04-14 NOTE — Assessment & Plan Note (Signed)
-   The patient will be admitted to a medical telemetry observation bed. - General surgery consult will be obtained. - I notified Dr. Hampton Abbot about the patient.

## 2022-04-14 NOTE — Assessment & Plan Note (Signed)
-   Potassium will be replaced.  Magnesium level will be checked.

## 2022-04-14 NOTE — Assessment & Plan Note (Signed)
We will continue his Uloric.

## 2022-04-14 NOTE — Plan of Care (Signed)

## 2022-04-14 NOTE — Assessment & Plan Note (Signed)
-   We will continue his antihypertensives. 

## 2022-04-14 NOTE — H&P (Signed)
[name] [name]   PATIENT NAME: [name] [name]    MR#:  [id number]  DATE OF BIRTH:  05/08/1941  DATE OF ADMISSION:  04/13/2022  PRIMARY CARE PHYSICIAN: Dion Body, MD   Patient is coming from: Home  REQUESTING/REFERRING PHYSICIAN: Hinda Kehr, MD  CHIEF COMPLAINT:   Chief Complaint  Patient presents with   Vascular Access Problem   Abdominal Pain    HISTORY OF PRESENT ILLNESS:  [name] [name] is a 81 y.o. African-American male with medical history significant for end-stage renal disease on hemodialysis who recently had a peritoneal catheter placed and was later replaced due to malfunction by Dr. Hampton Abbot on 8/16.  He was anemic then and was given 2 units of PRBCs.  He admits to right mid abdominal pain and denies any fever or chills.  No dyspnea or cough or wheezing.  No chest pain or palpitations.  No nausea or vomiting or diarrhea.  No bleeding diathesis.  ED Course: Labs revealed mild hypokalemia of 3.3, BUN of 17 with creatinine 4.13 with calcium 8.8 and otherwise unremarkable CMP.  CBC showed anemia with hemoglobin of 8 and hematocrit 24.5 with platelets of 114.  UA was positive for UTI.  Imaging: Abdominal pelvic CT scan revealed old disconnected percutaneous catheter tubing in the right anterior pelvic wall with no drainable fluid collection or abscess and no acute intra-abdominal pelvic pathology otherwise.  It showed aortic atherosclerosis.  The patient will be admitted to an inpatient medical telemetry bed for further evaluation and management. PAST MEDICAL HISTORY:   Past Medical History:  Diagnosis Date   Anemia    Arthritis    Cancer ([place])    Chronic kidney disease    Stage 4 CKD   GERD (gastroesophageal reflux disease)    h/o   Gout    Hyperlipidemia, mixed    Hypertension    Sleep apnea    h/o no cpap    PAST SURGICAL HISTORY:   Past Surgical History:  Procedure Laterality Date   APPENDECTOMY     Bunion Removal Right    CAPD  INSERTION N/A 11/23/2021   Procedure: LAPAROSCOPIC INSERTION CONTINUOUS AMBULATORY PERITONEAL DIALYSIS  (CAPD) CATHETER;  Surgeon: Olean Ree, MD;  Location: [facility];  Service: General;  Laterality: N/A;   CAPD REVISION N/A 01/31/2022   Procedure: LAPAROSCOPIC REVISION CONTINUOUS AMBULATORY PERITONEAL DIALYSIS  (CAPD) CATHETER;  Surgeon: Olean Ree, MD;  Location: [facility];  Service: General;  Laterality: N/A;   CAPD REVISION N/A 04/06/2022   Procedure: LAPAROSCOPIC REVISION CONTINUOUS AMBULATORY PERITONEAL DIALYSIS  (CAPD) CATHETER, replacement;  Surgeon: Olean Ree, MD;  Location: [facility];  Service: General;  Laterality: N/A;   COLONOSCOPY WITH PROPOFOL N/A 06/14/2015   Procedure: COLONOSCOPY WITH PROPOFOL;  Surgeon: Lollie Sails, MD;  [facility] ENDOSCOPY;  Service: Endoscopy;  Laterality: N/A;   DIALYSIS/PERMA CATHETER INSERTION N/A 10/03/2021   Procedure: DIALYSIS/PERMA CATHETER INSERTION;  Surgeon: Algernon Huxley, MD;  Location: [place] [facility];  Service: Cardiovascular;  Laterality: N/A;   EYE SURGERY     HERNIA REPAIR     abd   INSERTION OF MESH  11/23/2021   Procedure: INSERTION OF MESH;  Surgeon: Olean Ree, MD;  Location: [facility];  Service: General;;   PROSTATE BIOPSY N/A 03/27/2017   Procedure: PROSTATE BIOPSY-URO NAV;  Surgeon: Royston Cowper, MD;  Location: [facility];  Service: Urology;  Laterality: N/A;   PROSTATE BIOPSY N/A 09/11/2019   Procedure: PROSTATE BIOPSY URONAV FUSION;  Surgeon:  Royston Cowper, MD;  Location: [facility];  Service: Urology;  Laterality: N/A;   RETINAL DETACHMENT SURGERY Left    TRANSURETHRAL RESECTION OF PROSTATE     XI ROBOTIC ASSISTED VENTRAL HERNIA N/A 11/23/2021   Procedure: XI ROBOTIC ASSISTED VENTRAL HERNIA, incisional;  Surgeon: Olean Ree, MD;  Location: [facility];  Service: General;  Laterality: N/A;    SOCIAL HISTORY:   Social History   Tobacco Use   Smoking status: Never   Smokeless tobacco: Never  Substance Use  Topics   Alcohol use: Never    FAMILY HISTORY:   Family History  Problem Relation Age of Onset   Leukemia Father    Leukemia Brother     DRUG ALLERGIES:   Allergies  Allergen Reactions   Fish Allergy Swelling   Ciprofloxacin     Unknown reaction   Doxazosin Other (See Comments)    unknown    REVIEW OF SYSTEMS:   ROS As per history of present illness. All pertinent systems were reviewed above. Constitutional, HEENT, cardiovascular, respiratory, GI, GU, musculoskeletal, neuro, psychiatric, endocrine, integumentary and hematologic systems were reviewed and are otherwise negative/unremarkable except for positive findings mentioned above in the HPI.   MEDICATIONS AT HOME:   Prior to Admission medications   Medication Sig Start Date End Date Taking? Authorizing Provider  amLODipine (NORVASC) 10 MG tablet Take 10 mg by mouth every morning.   Yes [provider]  bicalutamide (CASODEX) 50 MG tablet Take 50 mg by mouth every morning. 10/23/19  Yes [provider]  cinacalcet (SENSIPAR) 30 MG tablet Take 30 mg by mouth daily.   Yes [provider]  enalapril (VASOTEC) 20 MG tablet Take 20 mg by mouth 2 (two) times daily.    Yes [provider]  hydrALAZINE (APRESOLINE) 100 MG tablet Take 100 mg by mouth 3 (three) times daily.   Yes [provider]  lovastatin (MEVACOR) 20 MG tablet Take 20 mg by mouth at bedtime.   Yes [provider]  metoprolol (LOPRESSOR) 50 MG tablet Take 50 mg by mouth 2 (two) times daily.   Yes [provider]  polyethylene glycol (MIRALAX / GLYCOLAX) 17 g packet Take 17 g by mouth 3 (three) times a week.   Yes [provider]  sodium bicarbonate 650 MG tablet Take 650 mg by mouth 2 (two) times daily.    Yes [provider]  ULORIC 40 MG tablet Take 40 mg by mouth every morning. 03/09/17  Yes [provider]  acetaminophen (TYLENOL) 500 MG tablet Take 2 tablets (1,000 mg  total) by mouth every 6 (six) hours as needed for mild pain. 01/31/22   Olean Ree, MD  azelastine (ASTELIN) 0.1 % nasal spray Place 1 spray into both nostrils 2 (two) times daily as needed for rhinitis. Use in each nostril as directed    [provider]  fluticasone (FLONASE) 50 MCG/ACT nasal spray Place 1 spray into both nostrils daily as needed for allergies or rhinitis.    [provider]  ondansetron (ZOFRAN) 4 MG tablet Take 1 tablet (4 mg total) by mouth every 6 (six) hours as needed for nausea. 04/08/22   Allie Bossier, MD  oxyCODONE (OXY IR/ROXICODONE) 5 MG immediate release tablet Take 1-2 tablets (5-10 mg total) by mouth every 4 (four) hours as needed for moderate pain or severe pain. 04/08/22   Allie Bossier, MD      VITAL SIGNS:  Blood pressure (!) 143/66, pulse 65, temperature  98.4 F (36.9 C), resp. rate 16, height '5\' 9"'$  (1.753 m), weight 81.6 kg, SpO2 100 %.  PHYSICAL EXAMINATION:  Physical Exam  GENERAL:  81 y.o.-year-old African-American male patient lying in the bed with no acute distress.  EYES: Pupils equal, round, reactive to light and accommodation. No scleral icterus. Extraocular muscles intact.  HEENT: Head atraumatic, normocephalic. Oropharynx and nasopharynx clear.  NECK:  Supple, no jugular venous distention. No thyroid enlargement, no tenderness.  LUNGS: Normal breath sounds bilaterally, no wheezing, rales,rhonchi or crepitation. No use of accessory muscles of respiration.  CARDIOVASCULAR: Regular rate and rhythm, S1, S2 normal. No murmurs, rubs, or gallops.  ABDOMEN: Soft, nondistended, mild right mid abdominal tenderness and right-sided lower abdominal PD catheter.  Bowel sounds present. No organomegaly or mass.  EXTREMITIES: No pedal edema, cyanosis, or clubbing.  NEUROLOGIC: Cranial nerves II through XII are intact. Muscle strength 5/5 in all extremities. Sensation intact. Gait not checked.  PSYCHIATRIC: The patient is alert and oriented  x 3.  Normal affect and good eye contact. SKIN: No obvious rash, lesion, or ulcer.   LABORATORY PANEL:   CBC Recent Labs  Lab 04/14/22 0430  WBC 7.0  HGB 7.7*  HCT 23.0*  PLT 97*   ------------------------------------------------------------------------------------------------------------------  Chemistries  Recent Labs  Lab 04/08/22 0528 04/13/22 1926 04/14/22 0430  NA 138 140 142  K 3.7 3.3* 3.2*  CL 103 102 109  CO2 '28 28 27  '$ GLUCOSE 95 112* 110*  BUN '22 17 17  '$ CREATININE 4.21* 4.13* 4.54*  CALCIUM 8.5* 8.8* 8.8*  MG 1.8  --   --   AST 14* 15  --   ALT 7 5  --   ALKPHOS 72 84  --   BILITOT 0.5 0.5  --    ------------------------------------------------------------------------------------------------------------------  Cardiac Enzymes No results for input(s): "TROPONINI" in the last 168 hours. ------------------------------------------------------------------------------------------------------------------  RADIOLOGY:  CT ABDOMEN PELVIS WO CONTRAST  Result Date: 04/14/2022 CLINICAL DATA:  Abdominal pain.  Concern for bowel obstruction. EXAM: CT ABDOMEN AND PELVIS WITHOUT CONTRAST TECHNIQUE: Multidetector CT imaging of the abdomen and pelvis was performed following the standard protocol without IV contrast. RADIATION DOSE REDUCTION: This exam was performed according to the departmental dose-optimization program which includes automated exposure control, adjustment of the mA and/or kV according to patient size and/or use of iterative reconstruction technique. COMPARISON:  CT abdomen pelvis dated 11/27/2021. FINDINGS: Evaluation of this exam is limited in the absence of intravenous contrast. Lower chest: There is a 4 mm right middle lobe nodule and a 4 mm right lower lobe nodule seen on the prior CT. The visualized lung bases are otherwise clear. No intra-abdominal free air or free fluid. Hepatobiliary: No focal liver abnormality is seen. No gallstones, gallbladder wall  thickening, or biliary dilatation. Pancreas: Unremarkable. No pancreatic ductal dilatation or surrounding inflammatory changes. Spleen: Normal in size without focal abnormality. Adrenals/Urinary Tract: The adrenal glands are unremarkable. There is mild bilateral renal parenchyma atrophy. The right kidney is located in the pelvis. There is a 4.5 x 4.0 cm heterogeneous mass in the interpolar left kidney which is not characterized on this CT and better evaluated on the prior abdominal MRI. Several additional bilateral renal cysts noted. There is no hydronephrosis or nephrolithiasis on either side. The visualized ureters and urinary bladder appear unremarkable. Stomach/Bowel: There is moderate stool throughout the colon. There is no bowel obstruction or active inflammation. The appendix is not visualized with certainty. No inflammatory changes identified in the right lower quadrant. Vascular/Lymphatic:  Moderate aortoiliac atherosclerotic disease. The IVC is unremarkable no portal venous gas. There is no adenopathy. Reproductive: The prostate and seminal vesicles are grossly unremarkable. No pelvic mass. Other: Small fat containing right inguinal hernia. There is diffuse subcutaneous stranding and edema. There is a percutaneous catheter with tip in the left lower anterior pelvis. There is discontinuity of the catheter within the subcutaneous soft tissues of the right anterior pelvic wall at the level of the connection. Recommend re-evaluation and reconnecting. No drainable fluid collection or abscess identified. Musculoskeletal: Degenerative changes of the spine and hips. No acute osseous pathology. IMPRESSION: 1. No acute intra-abdominal or pelvic pathology. No bowel obstruction. 2. Disconnected percutaneous catheter tubing in the right anterior pelvic wall. Recommend re-evaluation and reconnecting. No drainable fluid collection or abscess. 3. Aortic Atherosclerosis (ICD10-I70.0). Electronically Signed   By: Anner Crete M.D.   On: 04/14/2022 01:32      IMPRESSION AND PLAN:  Assessment and Plan: * PD catheter dysfunction ([place]) - The patient will be admitted to a medical telemetry observation bed. - General surgery consult will be obtained. - I notified Dr. Hampton Abbot about the patient.  ESRD (end stage renal disease) on dialysis [facility]) -Nephrology consult to be obtained for follow-up. - He will need hemodialysis may be in the morning.  He has a right chest wall catheter.  Hypokalemia - Potassium will be replaced.  Magnesium level will be checked.  Acute lower UTI - We will place him on IV Rocephin and follow urine culture and sensitivity.  Benign essential hypertension - We will continue his antihypertensives  Mixed hyperlipidemia - We will continue continue his statin therapy.  Gout We will continue his Uloric.  Type 2 diabetes mellitus with chronic kidney disease ([place]) - The patient will be placed on supplement coverage with NovoLog.   DVT prophylaxis: Subcutaneous heparin. Advanced Care Planning:  Code Status: He is DNR/DNI.  This was discussed with him. Family Communication:  The plan of care was discussed in details with the patient (and family). I answered all questions. The patient agreed to proceed with the above mentioned plan. Further management will depend upon hospital course. Disposition Plan: Back to previous home environment Consults called: Nephrology and surgery. All the records are reviewed and case discussed with ED provider.  Status is: Observation  I certify that at the time of admission, it is my clinical judgment that the patient will require inpatient hospital care extending more than 2 midnights.                            Dispo: The patient is from: Home              Anticipated d/c is to: Home              Patient currently is not medically stable to d/c.              Difficult to place patient: No  Christel Mormon M.D on 04/14/2022 at 5:54 AM  [facility]   From 7 PM-7 AM, contact night-coverage [email or web address]  CC: Primary care physician; Dion Body, MD

## 2022-04-15 ENCOUNTER — Encounter: Payer: Self-pay | Admitting: Surgery

## 2022-04-15 DIAGNOSIS — T85611A Breakdown (mechanical) of intraperitoneal dialysis catheter, initial encounter: Secondary | ICD-10-CM | POA: Diagnosis not present

## 2022-04-15 LAB — CBC WITH DIFFERENTIAL/PLATELET
Abs Immature Granulocytes: 0.02 10*3/uL (ref 0.00–0.07)
Basophils Absolute: 0 10*3/uL (ref 0.0–0.1)
Basophils Relative: 0 %
Eosinophils Absolute: 0.4 10*3/uL (ref 0.0–0.5)
Eosinophils Relative: 5 %
HCT: 23 % — ABNORMAL LOW (ref 39.0–52.0)
Hemoglobin: 7.5 g/dL — ABNORMAL LOW (ref 13.0–17.0)
Immature Granulocytes: 0 %
Lymphocytes Relative: 23 %
Lymphs Abs: 1.7 10*3/uL (ref 0.7–4.0)
MCH: 29.2 pg (ref 26.0–34.0)
MCHC: 32.6 g/dL (ref 30.0–36.0)
MCV: 89.5 fL (ref 80.0–100.0)
Monocytes Absolute: 0.7 10*3/uL (ref 0.1–1.0)
Monocytes Relative: 10 %
Neutro Abs: 4.8 10*3/uL (ref 1.7–7.7)
Neutrophils Relative %: 62 %
Platelets: 95 10*3/uL — ABNORMAL LOW (ref 150–400)
RBC: 2.57 MIL/uL — ABNORMAL LOW (ref 4.22–5.81)
RDW: 16.2 % — ABNORMAL HIGH (ref 11.5–15.5)
WBC: 7.6 10*3/uL (ref 4.0–10.5)
nRBC: 0 % (ref 0.0–0.2)

## 2022-04-15 LAB — BASIC METABOLIC PANEL
Anion gap: 6 (ref 5–15)
BUN: 10 mg/dL (ref 8–23)
CO2: 30 mmol/L (ref 22–32)
Calcium: 8.5 mg/dL — ABNORMAL LOW (ref 8.9–10.3)
Chloride: 103 mmol/L (ref 98–111)
Creatinine, Ser: 3.13 mg/dL — ABNORMAL HIGH (ref 0.61–1.24)
GFR, Estimated: 19 mL/min — ABNORMAL LOW (ref >60)
Glucose, Bld: 92 mg/dL (ref 70–99)
Potassium: 4 mmol/L (ref 3.5–5.1)
Sodium: 139 mmol/L (ref 135–145)

## 2022-04-15 LAB — HEPATITIS B SURFACE ANTIBODY, QUANTITATIVE: Hep B S AB Quant (Post): <3.1 m[IU]/mL — ABNORMAL LOW (ref >9.9)

## 2022-04-15 LAB — URINE CULTURE: Culture: NO GROWTH

## 2022-04-15 NOTE — Progress Notes (Signed)
Patient ID: [name] [name], male   DOB: [birth year], 81 y.o.   MRN: [id number]      [facility] Day(s): 1.   Interval History: Patient seen and examined, no acute events or new complaints overnight. Patient reports feeling well.  He denies any complaint  Vital signs in last 24 hours: [min-max] current  Temp:  [97 F (36.1 C)-98.2 F (36.8 C)] 98.1 F (36.7 C) (08/26 0908) Pulse Rate:  [68-81] 68 (08/26 0908) Resp:  [9-21] 16 (08/25 2235) BP: (112-140)/(56-78) 112/67 (08/26 0908) SpO2:  [96 %-100 %] 100 % (08/26 0908) Weight:  [81.6 kg] 81.6 kg (08/25 1243)     Height: '5\' 9"'$  (175.3 cm) Weight: 81.6 kg BMI (Calculated): 26.55   Physical Exam:  Constitutional: alert, cooperative and no distress  Respiratory: breathing non-labored at rest  Cardiovascular: regular rate and sinus rhythm  Gastrointestinal: soft, non-tender, and non-distended.  Wounds are dry and clean.  Labs:     Latest Ref Rng & Units 04/15/2022    7:54 AM 04/14/2022    4:30 AM 04/13/2022    7:26 PM  CBC  WBC 4.0 - 10.5 K/uL 7.6  7.0  6.7   Hemoglobin 13.0 - 17.0 g/dL 7.5  7.7  8.0   Hematocrit 39.0 - 52.0 % 23.0  23.0  24.5   Platelets 150 - 400 K/uL 95  97  114       Latest Ref Rng & Units 04/15/2022    7:54 AM 04/14/2022    4:30 AM 04/13/2022    7:26 PM  CMP  Glucose 70 - 99 mg/dL 92  110  112   BUN 8 - 23 mg/dL '10  17  17   '$ Creatinine 0.61 - 1.24 mg/dL 3.13  4.54  4.13   Sodium 135 - 145 mmol/L 139  142  140   Potassium 3.5 - 5.1 mmol/L 4.0  3.2  3.3   Chloride 98 - 111 mmol/L 103  109  102   CO2 22 - 32 mmol/L '30  27  28   '$ Calcium 8.9 - 10.3 mg/dL 8.5  8.8  8.8   Total Protein 6.5 - 8.1 g/dL   6.9   Total Bilirubin 0.3 - 1.2 mg/dL   0.5   Alkaline Phos 38 - 126 U/L   84   AST 15 - 41 U/L   15   ALT 0 - 44 U/L   5     Imaging studies: No new pertinent imaging studies   Assessment/Plan:  81 y.o. male with malfunctioning hemodialysis catheter 1 Day Post-Op s/p revision of PD  catheter, complicated by pertinent comorbidities including ESRD on hemodialysis.  Patient evaluated this morning.  Wounds are dry and clean.  No sign of complication from surgical intervention.  As per Dr. Hampton Abbot who had discussion with family there will be no further exam for peritoneal dialysis.  No further surgical intervention.  Continue medical management as per hospitalist and nephrology.  General surgery will sign off.  Arnold Long, MD

## 2022-04-15 NOTE — Progress Notes (Signed)
[facility]  ROUNDING NOTE   Subjective:   [name] [name] is a 81 year old male with past medical history including gout, hyperlipidemia, hypertension, and end-stage renal disease on hemodialysis.  Patient presents to the emergency department with complaints of abdominal pain for.  Patient has been admitted for Generalized abdominal pain [R10.84] ESRD (end stage renal disease) on dialysis ([place]) [N18.6, Z99.2] ESRD on hemodialysis ([place]) [N18.6, Z99.2] Malposition of peritoneal dialysis catheter [facility]) [D63.875I] Peritoneal dialysis catheter dysfunction ([place]) [E33.295J]  Patient is known to our practice and currently receives outpatient hemodialysis treatments at [facility] on a MWF schedule, supervised by Dr. Holley Raring.  Patient states he began to have abdominal pain yesterday.  Denies any heavy lifting or awkward movements.  Continues to receive outpatient hemodialysis treatments until PD catheter ready for use.  Denies nausea and vomiting.  Denies chest pain or discomfort.  Denies cough or shortness of breath.  No known fever or chills.  Currently n.p.o. for procedure.  Patient was seen today on first floor Patient main concern in today visit was that he would like to go home as he was feeling much better   Objective:  Vital signs in last 24 hours:  Temp:  [97 F (36.1 C)-98.2 F (36.8 C)] 98.1 F (36.7 C) (08/26 0908) Pulse Rate:  [68-81] 68 (08/26 0908) Resp:  [9-21] 16 (08/25 2235) BP: (112-140)/(56-78) 112/67 (08/26 0908) SpO2:  [96 %-100 %] 100 % (08/26 0908) Weight:  [81.6 kg] 81.6 kg (08/25 1243)  Weight change: -0.047 kg Filed Weights   04/13/22 1924 04/14/22 1243  Weight: 81.6 kg 81.6 kg    Intake/Output: I/O last 3 completed shifts: In: 300 [I.V.:200; IV Piggyback:100] Out: 560 [Urine:550; Blood:10]   Intake/Output this shift:  No intake/output data recorded.  Physical Exam: General: NAD, resting comfortably  Head: Normocephalic, atraumatic. Moist  oral mucosal membranes  Eyes: Anicteric  Lungs:  Clear to auscultation, normal effort  Heart: Regular rate and rhythm sinus bradycardia  Abdomen:  Soft, nontender, , Multiple  surgical scars  Extremities: No peripheral edema.  Neurologic: Nonfocal, moving all four extremities  Skin: No lesions  Access: Right chest PermCath,    Basic Metabolic Panel: Recent Labs  Lab 04/13/22 1926 04/14/22 0430 04/15/22 0754  NA 140 142 139  K 3.3* 3.2* 4.0  CL 102 109 103  CO2 '28 27 30  '$ GLUCOSE 112* 110* 92  BUN '17 17 10  '$ CREATININE 4.13* 4.54* 3.13*  CALCIUM 8.8* 8.8* 8.5*    Liver Function Tests: Recent Labs  Lab 04/13/22 1926  AST 15  ALT 5  ALKPHOS 84  BILITOT 0.5  PROT 6.9  ALBUMIN 3.5   Recent Labs  Lab 04/13/22 1926  LIPASE 45   No results for input(s): "AMMONIA" in the last 168 hours.  CBC: Recent Labs  Lab 04/13/22 1926 04/14/22 0430 04/15/22 0754  WBC 6.7 7.0 7.6  NEUTROABS 3.3  --  4.8  HGB 8.0* 7.7* 7.5*  HCT 24.5* 23.0* 23.0*  MCV 89.7 88.5 89.5  PLT 114* 97* 95*    Cardiac Enzymes: No results for input(s): "CKTOTAL", "CKMB", "CKMBINDEX", "TROPONINI" in the last 168 hours.  BNP: Invalid input(s): "POCBNP"  CBG: No results for input(s): "GLUCAP" in the last 168 hours.   Microbiology: Results for orders placed or performed during the hospital encounter of 04/13/22  Urine Culture     Status: None   Collection Time: 04/14/22 12:11 AM   Specimen: Urine, Random  Result Value Ref Range Status  Specimen Description   Final    URINE, RANDOM Performed at [facility], [address], [place] , [place] [address]    Special Requests   Final    NONE Performed at [facility], [address] [place], [place] [address]    Culture   Final    NO GROWTH Performed at [facility], [place] [address], [place], [place] [address]    Report Status 04/15/2022 FINAL  Final    Coagulation Studies: No results for input(s): "LABPROT",  "INR" in the last 72 hours.  Urinalysis: Recent Labs    04/14/22 0011  COLORURINE STRAW*  LABSPEC 1.006  PHURINE 9.0*  GLUCOSEU NEGATIVE  HGBUR LARGE*  BILIRUBINUR NEGATIVE  KETONESUR NEGATIVE  PROTEINUR 100*  NITRITE NEGATIVE  LEUKOCYTESUR NEGATIVE      Imaging: CT ABDOMEN PELVIS WO CONTRAST  Result Date: 04/14/2022 CLINICAL DATA:  Abdominal pain.  Concern for bowel obstruction. EXAM: CT ABDOMEN AND PELVIS WITHOUT CONTRAST TECHNIQUE: Multidetector CT imaging of the abdomen and pelvis was performed following the standard protocol without IV contrast. RADIATION DOSE REDUCTION: This exam was performed according to the departmental dose-optimization program which includes automated exposure control, adjustment of the mA and/or kV according to patient size and/or use of iterative reconstruction technique. COMPARISON:  CT abdomen pelvis dated 11/27/2021. FINDINGS: Evaluation of this exam is limited in the absence of intravenous contrast. Lower chest: There is a 4 mm right middle lobe nodule and a 4 mm right lower lobe nodule seen on the prior CT. The visualized lung bases are otherwise clear. No intra-abdominal free air or free fluid. Hepatobiliary: No focal liver abnormality is seen. No gallstones, gallbladder wall thickening, or biliary dilatation. Pancreas: Unremarkable. No pancreatic ductal dilatation or surrounding inflammatory changes. Spleen: Normal in size without focal abnormality. Adrenals/Urinary Tract: The adrenal glands are unremarkable. There is mild bilateral renal parenchyma atrophy. The right kidney is located in the pelvis. There is a 4.5 x 4.0 cm heterogeneous mass in the interpolar left kidney which is not characterized on this CT and better evaluated on the prior abdominal MRI. Several additional bilateral renal cysts noted. There is no hydronephrosis or nephrolithiasis on either side. The visualized ureters and urinary bladder appear unremarkable. Stomach/Bowel: There is  moderate stool throughout the colon. There is no bowel obstruction or active inflammation. The appendix is not visualized with certainty. No inflammatory changes identified in the right lower quadrant. Vascular/Lymphatic: Moderate aortoiliac atherosclerotic disease. The IVC is unremarkable no portal venous gas. There is no adenopathy. Reproductive: The prostate and seminal vesicles are grossly unremarkable. No pelvic mass. Other: Small fat containing right inguinal hernia. There is diffuse subcutaneous stranding and edema. There is a percutaneous catheter with tip in the left lower anterior pelvis. There is discontinuity of the catheter within the subcutaneous soft tissues of the right anterior pelvic wall at the level of the connection. Recommend re-evaluation and reconnecting. No drainable fluid collection or abscess identified. Musculoskeletal: Degenerative changes of the spine and hips. No acute osseous pathology. IMPRESSION: 1. No acute intra-abdominal or pelvic pathology. No bowel obstruction. 2. Disconnected percutaneous catheter tubing in the right anterior pelvic wall. Recommend re-evaluation and reconnecting. No drainable fluid collection or abscess. 3. Aortic Atherosclerosis (ICD10-I70.0). Electronically Signed   By: Anner Crete M.D.   On: 04/14/2022 01:32     Medications:    anticoagulant sodium citrate      amLODipine  10 mg Oral Q breakfast   bicalutamide  50 mg Oral Q  breakfast   Chlorhexidine Gluconate Cloth  6 each Topical Q0600   cinacalcet  30 mg Oral Q breakfast   enalapril  20 mg Oral BID   febuxostat  40 mg Oral Q breakfast   heparin  5,000 Units Subcutaneous Q8H   hydrALAZINE  100 mg Oral TID   metoprolol tartrate  50 mg Oral BID   polyethylene glycol  17 g Oral Once per day on Mon Wed Fri   pravastatin  20 mg Oral q1800   sodium bicarbonate  650 mg Oral BID   acetaminophen **OR** acetaminophen, alteplase, anticoagulant sodium citrate, azelastine, fentaNYL (SUBLIMAZE)  injection, fluticasone, heparin, lidocaine (PF), lidocaine-prilocaine, magnesium hydroxide, ondansetron **OR** ondansetron (ZOFRAN) IV, oxyCODONE, pentafluoroprop-tetrafluoroeth, traZODone  Assessment/ Plan:  Mr. Can Lucci is a 81 y.o.  male with past medical history including gout, hyperlipidemia, hypertension, and end-stage renal disease on hemodialysis.  Patient presents to the hospital for scheduled surgery to evaluate and possibly replace malfunctioning PD catheter.  Patient has been admitted under observation for Generalized abdominal pain [R10.84] ESRD (end stage renal disease) on dialysis ([place]) [N18.6, Z99.2] ESRD on hemodialysis ([place]) [N18.6, Z99.2] Malposition of peritoneal dialysis catheter [facility]) [G25.427C] Peritoneal dialysis catheter dysfunction ([place]) [T85.611A]  [facility]/MWF/right chest PermCath  Anemia of chronic kidney disease Lab Results  Component Value Date   HGB 7.5 (L) 04/15/2022   Hemoglobin below desired target.  Patient receives Mircera patient.  2.  End-stage renal disease on hemodialysis.  Patient is on hemodialysis on Monday Wednesday Friday schedule Patient was last dialyzed yesterday No need for renal placement therapy today  Patient receives backup hemodialysis at [place] until PD catheter ready for use. CT abd showeds PDC disconnected.  Patient underwent laparoscopic assisted revision of peritoneal dialysis catheter yesterday-April 14, 2022 Patient peritoneal dialysis catheter was removed   3. Secondary Hyperparathyroidism: with outpatient labs: PTH 271, phosphorus 5.3, calcium 8.0 on 03/02/2022.   Lab Results  Component Value Date   CALCIUM 8.5 (L) 04/15/2022   CAION 1.11 (L) 04/05/2022   PHOS 3.2 04/08/2022     We will continue to monitor bone minerals during this admission  4.  Hypertension with chronic kidney disease.  Home regimen includes amlodipine, enalapril, hydralazine, and metoprolol.  Currently receiving these  medications.  Blood pressure well controlled, 125/56  5.Left renal mass Patient CT with him and showed the patient a left renal mass is now at around 4.5 cm Patient renal mass been evaluated and the last MRI was at around 3.4 cm, patient will benefit from outpatient urology work-up   LOS: 1 Jordin Dambrosio s [facility] 8/26/20239:26 AM

## 2022-04-15 NOTE — Discharge Summary (Addendum)
Physician Discharge Summary  [name] [name] EZM:[id number] DOB: 10/18/1940 DOA: 04/13/2022  PCP: Dion Body, MD  Admit date: 04/13/2022 Discharge date: 04/15/2022  Admitted From: Home Disposition: Home with home health  Recommendations for Outpatient Follow-up:  Follow up with PCP in 1-2 weeks Follow-up outpatient nephrology as directed  Home Health: Yes PT OT RN aide Equipment/Devices: None  Discharge Condition: Stable CODE STATUS: DNR Diet recommendation: Renal with fluid restriction  Brief/Interim Summary: 81 year old male with history of recently placed peritoneal dialysis catheter who presents for chief complaint of abdominal pain.  Abdominal CT revealed disconnected percutaneous catheter tubing in the right anterior pelvic wall.  General surgery and nephrology on board.   Discussed with Dr. [place] Abbot.  Status post exploration in OR.  Peritoneal dialysis catheter tubing had disconnected.  Unable to safely reconnect.  As such after discussion with the patient's daughter the decision was made to remove peritoneal dialysis catheter entirely.  This was completed successfully.  Patient underwent hemodialysis successfully on 8/25.  He was seen by myself, nephrology, general surgery on the day of discharge.  All in agreement with discharge plan.  Patient to resume his prior to arrival Monday Wednesday Friday outpatient hemodialysis schedule at [place].  Home health services ordered.    Patient has a renal mass noted on abdominal CT.  Ambulatory referral to urology placed for follow-up.   Discharge Diagnoses:  Principal Problem:   PD catheter dysfunction ([place]) Active Problems:   Anemia   Hypokalemia   ESRD (end stage renal disease) on dialysis ([place])   Acute lower UTI   Benign essential hypertension   Mixed hyperlipidemia   Type 2 diabetes mellitus with chronic kidney disease (HCC)   Gout   Peritoneal dialysis catheter dysfunction [facility])  Peritoneal dialysis catheter  malfunction Patient was taken to the operating room for malfunctioning hemodialysis catheter.  Catheter could not be fixed or repaired so was disconnected entirely.  Postsurgically patient did well.  He underwent hemodialysis successfully on 8/25.  On day of discharge was evaluated by general surgery.  Surgical wounds dry and clean.  No signs of immediate postoperative complication.  Patient stable for discharge at this time.  Will resume prior to arrival when he was a Friday outpatient hemodialysis schedule.  Follow-up outpatient PCP and nephrology.  Discharge Instructions  Discharge Instructions     Diet - low sodium heart healthy   Complete by: As directed    Increase activity slowly   Complete by: As directed    No wound care   Complete by: As directed       Allergies as of 04/15/2022       Reactions   Fish Allergy Swelling   Ciprofloxacin    Unknown reaction   Doxazosin Other (See Comments)   unknown        Medication List     TAKE these medications    acetaminophen 500 MG tablet Commonly known as: TYLENOL Take 2 tablets (1,000 mg total) by mouth every 6 (six) hours as needed for mild pain.   amLODipine 10 MG tablet Commonly known as: NORVASC Take 10 mg by mouth every morning.   azelastine 0.1 % nasal spray Commonly known as: ASTELIN Place 1 spray into both nostrils 2 (two) times daily as needed for rhinitis. Use in each nostril as directed   bicalutamide 50 MG tablet Commonly known as: CASODEX Take 50 mg by mouth every morning.   cinacalcet 30 MG tablet Commonly known as: SENSIPAR Take 30 mg by mouth  daily.   enalapril 20 MG tablet Commonly known as: VASOTEC Take 20 mg by mouth 2 (two) times daily.   fluticasone 50 MCG/ACT nasal spray Commonly known as: FLONASE Place 1 spray into both nostrils daily as needed for allergies or rhinitis.   hydrALAZINE 100 MG tablet Commonly known as: APRESOLINE Take 100 mg by mouth 3 (three) times daily.    lovastatin 20 MG tablet Commonly known as: MEVACOR Take 20 mg by mouth at bedtime.   metoprolol tartrate 50 MG tablet Commonly known as: LOPRESSOR Take 50 mg by mouth 2 (two) times daily.   ondansetron 4 MG tablet Commonly known as: ZOFRAN Take 1 tablet (4 mg total) by mouth every 6 (six) hours as needed for nausea.   oxyCODONE 5 MG immediate release tablet Commonly known as: Oxy IR/ROXICODONE Take 1-2 tablets (5-10 mg total) by mouth every 4 (four) hours as needed for moderate pain or severe pain.   polyethylene glycol 17 g packet Commonly known as: MIRALAX / GLYCOLAX Take 17 g by mouth 3 (three) times a week.   sodium bicarbonate 650 MG tablet Take 650 mg by mouth 2 (two) times daily.   Uloric 40 MG tablet Generic drug: febuxostat Take 40 mg by mouth every morning.        Allergies  Allergen Reactions   Fish Allergy Swelling   Ciprofloxacin     Unknown reaction   Doxazosin Other (See Comments)    unknown    Consultations: General surgery Nephrology   Procedures/Studies: CT ABDOMEN PELVIS WO CONTRAST  Result Date: 04/14/2022 CLINICAL DATA:  Abdominal pain.  Concern for bowel obstruction. EXAM: CT ABDOMEN AND PELVIS WITHOUT CONTRAST TECHNIQUE: Multidetector CT imaging of the abdomen and pelvis was performed following the standard protocol without IV contrast. RADIATION DOSE REDUCTION: This exam was performed according to the departmental dose-optimization program which includes automated exposure control, adjustment of the mA and/or kV according to patient size and/or use of iterative reconstruction technique. COMPARISON:  CT abdomen pelvis dated 11/27/2021. FINDINGS: Evaluation of this exam is limited in the absence of intravenous contrast. Lower chest: There is a 4 mm right middle lobe nodule and a 4 mm right lower lobe nodule seen on the prior CT. The visualized lung bases are otherwise clear. No intra-abdominal free air or free fluid. Hepatobiliary: No focal  liver abnormality is seen. No gallstones, gallbladder wall thickening, or biliary dilatation. Pancreas: Unremarkable. No pancreatic ductal dilatation or surrounding inflammatory changes. Spleen: Normal in size without focal abnormality. Adrenals/Urinary Tract: The adrenal glands are unremarkable. There is mild bilateral renal parenchyma atrophy. The right kidney is located in the pelvis. There is a 4.5 x 4.0 cm heterogeneous mass in the interpolar left kidney which is not characterized on this CT and better evaluated on the prior abdominal MRI. Several additional bilateral renal cysts noted. There is no hydronephrosis or nephrolithiasis on either side. The visualized ureters and urinary bladder appear unremarkable. Stomach/Bowel: There is moderate stool throughout the colon. There is no bowel obstruction or active inflammation. The appendix is not visualized with certainty. No inflammatory changes identified in the right lower quadrant. Vascular/Lymphatic: Moderate aortoiliac atherosclerotic disease. The IVC is unremarkable no portal venous gas. There is no adenopathy. Reproductive: The prostate and seminal vesicles are grossly unremarkable. No pelvic mass. Other: Small fat containing right inguinal hernia. There is diffuse subcutaneous stranding and edema. There is a percutaneous catheter with tip in the left lower anterior pelvis. There is discontinuity of the catheter within the subcutaneous  soft tissues of the right anterior pelvic wall at the level of the connection. Recommend re-evaluation and reconnecting. No drainable fluid collection or abscess identified. Musculoskeletal: Degenerative changes of the spine and hips. No acute osseous pathology. IMPRESSION: 1. No acute intra-abdominal or pelvic pathology. No bowel obstruction. 2. Disconnected percutaneous catheter tubing in the right anterior pelvic wall. Recommend re-evaluation and reconnecting. No drainable fluid collection or abscess. 3. Aortic  Atherosclerosis (ICD10-I70.0). Electronically Signed   By: Anner Crete M.D.   On: 04/14/2022 01:32      Subjective: Seen and examined the day of discharge.  Stable no distress.  No pain.  Stable for discharge home.  Discharge Exam: Vitals:   04/14/22 2249 04/15/22 0908  BP: 126/61 112/67  Pulse: 74 68  Resp:    Temp: 98.2 F (36.8 C) 98.1 F (36.7 C)  SpO2: 100% 100%   Vitals:   04/14/22 2215 04/14/22 2235 04/14/22 2249 04/15/22 0908  BP: 122/64 132/65 126/61 112/67  Pulse:  72 74 68  Resp: 18 16    Temp:  97.9 F (36.6 C) 98.2 F (36.8 C) 98.1 F (36.7 C)  TempSrc:  Oral Oral   SpO2:  99% 100% 100%  Weight:      Height:        General: Pt is alert, awake, not in acute distress Cardiovascular: RRR, S1/S2 +, no rubs, no gallops Respiratory: CTA bilaterally, no wheezing, no rhonchi Abdominal: Soft, NT, ND, bowel sounds + Extremities: no edema, no cyanosis    The results of significant diagnostics from this hospitalization (including imaging, microbiology, ancillary and laboratory) are listed below for reference.     Microbiology: Recent Results (from the past 240 hour(s))  Urine Culture     Status: None   Collection Time: 04/14/22 12:11 AM   Specimen: Urine, Random  Result Value Ref Range Status   Specimen Description   Final    URINE, RANDOM Performed at [facility], [address] [place], [place] [address]    Special Requests   Final    NONE Performed at [facility], [address], [place], [place] [address]    Culture   Final    NO GROWTH Performed at [facility], [place] [address] [place], [place] [address]    Report Status 04/15/2022 FINAL  Final     Labs: BNP (last 3 results) No results for input(s): "BNP" in the last 8760 hours. Basic Metabolic Panel: Recent Labs  Lab 04/13/22 1926 04/14/22 0430 04/15/22 0754  NA 140 142 139  K 3.3* 3.2* 4.0  CL 102 109 103  CO2 '28 27 30  '$ GLUCOSE 112* 110* 92  BUN  '17 17 10  '$ CREATININE 4.13* 4.54* 3.13*  CALCIUM 8.8* 8.8* 8.5*   Liver Function Tests: Recent Labs  Lab 04/13/22 1926  AST 15  ALT 5  ALKPHOS 84  BILITOT 0.5  PROT 6.9  ALBUMIN 3.5   Recent Labs  Lab 04/13/22 1926  LIPASE 45   No results for input(s): "AMMONIA" in the last 168 hours. CBC: Recent Labs  Lab 04/13/22 1926 04/14/22 0430 04/15/22 0754  WBC 6.7 7.0 7.6  NEUTROABS 3.3  --  4.8  HGB 8.0* 7.7* 7.5*  HCT 24.5* 23.0* 23.0*  MCV 89.7 88.5 89.5  PLT 114* 97* 95*   Cardiac Enzymes: No results for input(s): "CKTOTAL", "CKMB", "CKMBINDEX", "TROPONINI" in the last 168 hours. BNP: Invalid input(s): "POCBNP" CBG: No results for input(s): "GLUCAP" in the last 168  hours. D-Dimer No results for input(s): "DDIMER" in the last 72 hours. Hgb A1c No results for input(s): "HGBA1C" in the last 72 hours. Lipid Profile No results for input(s): "CHOL", "HDL", "LDLCALC", "TRIG", "CHOLHDL", "LDLDIRECT" in the last 72 hours. Thyroid function studies No results for input(s): "TSH", "T4TOTAL", "T3FREE", "THYROIDAB" in the last 72 hours.  Invalid input(s): "FREET3" Anemia work up No results for input(s): "VITAMINB12", "FOLATE", "FERRITIN", "TIBC", "IRON", "RETICCTPCT" in the last 72 hours. Urinalysis    Component Value Date/Time   COLORURINE STRAW (A) 04/14/2022 0011   APPEARANCEUR HAZY (A) 04/14/2022 0011   APPEARANCEUR Cloudy 10/11/2012 1930   LABSPEC 1.006 04/14/2022 0011   LABSPEC 1.018 10/11/2012 1930   PHURINE 9.0 (H) 04/14/2022 0011   GLUCOSEU NEGATIVE 04/14/2022 0011   GLUCOSEU Negative 10/11/2012 1930   HGBUR LARGE (A) 04/14/2022 0011   BILIRUBINUR NEGATIVE 04/14/2022 0011   BILIRUBINUR Negative 10/11/2012 1930   KETONESUR NEGATIVE 04/14/2022 0011   PROTEINUR 100 (A) 04/14/2022 0011   NITRITE NEGATIVE 04/14/2022 0011   LEUKOCYTESUR NEGATIVE 04/14/2022 0011   LEUKOCYTESUR Negative 10/11/2012 1930   Sepsis Labs Recent Labs  Lab 04/13/22 1926  04/14/22 0430 04/15/22 0754  WBC 6.7 7.0 7.6   Microbiology Recent Results (from the past 240 hour(s))  Urine Culture     Status: None   Collection Time: 04/14/22 12:11 AM   Specimen: Urine, Random  Result Value Ref Range Status   Specimen Description   Final    URINE, RANDOM Performed at [facility], [address] [place], [place] [address]    Special Requests   Final    NONE Performed at [facility], [address], [place], [place] [address]    Culture   Final    NO GROWTH Performed at [facility], [place] [address], [place], [place] [address]    Report Status 04/15/2022 FINAL  Final     Time coordinating discharge: Over 30 minutes  SIGNED:   Sidney Ace, MD  [facility] 04/15/2022, 11:09 AM Pager   If 7PM-7AM, please contact night-coverage

## 2022-04-15 NOTE — Evaluation (Addendum)
Occupational Therapy Evaluation Patient Details Name: [name] [name] MRN: [id number] DOB: 10-27-1940 Today's Date: 04/15/2022   History of Present Illness 81 year old male with history of recently placed peritoneal dialysis catheter who presents for chief complaint of abdominal pain.  Abdominal CT revealed disconnected percutaneous catheter tubing in the right anterior pelvic wall.   Clinical Impression   Mr Tollison was seen for OT evaluation this date. Prior to hospital admission, pt was Independent for mobility and ADLs, uses SPC as needed. Pt lives with spouse and daughter, [place] aids to assist caregiving for spouse. Pt presents to acute OT demonstrating near baseline mobility and ADLs. Pt currently Independent don underwear/ pants and completes ~200 ft mobility no AD. SBA ascend/descend 4 stairs with L rail use. All education complete, no skilled acute OT needs identified, will sign off. Upon hospital discharge, recommend HHOT to maximize pt safety and return to PLOF.     Recommendations for follow up therapy are one component of a multi-disciplinary discharge planning process, led by the attending physician.  Recommendations may be updated based on patient status, additional functional criteria and insurance authorization.   Follow Up Recommendations  Home health OT    Assistance Recommended at Discharge Set up Supervision/Assistance  Patient can return home with the following Help with stairs or ramp for entrance    Functional Status Assessment  Patient has not had a recent decline in their functional status  Equipment Recommendations  None recommended by OT    Recommendations for Other Services       Precautions / Restrictions Precautions Precautions: None Restrictions Weight Bearing Restrictions: No      Mobility Bed Mobility               General bed mobility comments: received and left in chair    Transfers Overall transfer level: Independent Equipment used:  None                      Balance Overall balance assessment: No apparent balance deficits (not formally assessed)                                         ADL either performed or assessed with clinical judgement   ADL Overall ADL's : Independent                                       General ADL Comments: Dons underwear/ pants Independently and completes ~200 ft mobility no AD. SBA ascend/descend 4 stairs with L rail use.      Pertinent Vitals/Pain Pain Assessment Pain Assessment: No/denies pain     Hand Dominance     Extremity/Trunk Assessment Upper Extremity Assessment Upper Extremity Assessment: Overall WFL for tasks assessed   Lower Extremity Assessment Lower Extremity Assessment: Overall WFL for tasks assessed       Communication Communication Communication: No difficulties   Cognition Arousal/Alertness: Awake/alert Behavior During Therapy: WFL for tasks assessed/performed Overall Cognitive Status: Within Functional Limits for tasks assessed                                                  Home Living Family/patient expects to  be discharged to:: Private residence Living Arrangements: Spouse/significant other Available Help at Discharge: Family;Available 24 hours/day Type of Home: House Home Access: Stairs to enter [employer] of Steps: 4 Entrance Stairs-Rails: Left Home Layout: One level     Bathroom Shower/Tub: Walk-in shower         Home Equipment: Radio producer - single point;BSC/3in1;Shower seat   Additional Comments: lives with daughter and wife      Prior Functioning/Environment Prior Level of Function : Independent/Modified Independent             Mobility Comments: caregiver for his spouse who is bed bound, HH aids assist          OT Problem List: Decreased activity tolerance         OT Goals(Current goals can be found in the care plan section) Acute Rehab OT  Goals Patient Stated Goal: to go home OT Goal Formulation: With patient Time For Goal Achievement: 04/29/22 Potential to Achieve Goals: Good   AM-PAC OT "6 Clicks" Daily Activity     Outcome Measure Help from another person eating meals?: None Help from another person taking care of personal grooming?: None Help from another person toileting, which includes using toliet, bedpan, or urinal?: None Help from another person bathing (including washing, rinsing, drying)?: None Help from another person to put on and taking off regular upper body clothing?: None Help from another person to put on and taking off regular lower body clothing?: None 6 Click Score: 24   End of Session    Activity Tolerance: Patient tolerated treatment well Patient left: in chair;with call bell/phone within reach  OT Visit Diagnosis: Unsteadiness on feet (R26.81);Other abnormalities of gait and mobility (R26.89)                Time: 1610-9604 OT Time Calculation (min): 13 min Charges:  OT General Charges $OT Visit: 1 Visit OT Evaluation $OT Eval Low Complexity: 1 Low  Dessie Coma, M.S. OTR/L  04/15/22, 10:24 AM  ascom [phone]

## 2022-04-15 NOTE — Plan of Care (Signed)
Patient discharged per MD orders at this time.All discharge instructions,education & medications reviewed with the patient.Pt expressed understanding and will comply with dc instructions.follow up appointments was also communicated to the patient.no verbal c/o or any ssx of distress.Pt was discharged home with HH/PT/OT services per order.Pt was transported home by daughter in a privately owned vehicle.

## 2022-04-15 NOTE — Progress Notes (Signed)
PT Cancellation Note  Patient Details Name: [name] [name] MRN: [id number] DOB: Apr 05, 1941   Cancelled Treatment:    Reason Eval/Treat Not Completed: PT screened, no needs identified, will sign off PT orders received, chart reviewed. Pt ambulating with OT without overt balance deficits noted & pt reporting he's mobilizing in his baseline manner without AD. No acute PT needs identified at this time. MD made aware of no acute PT need but pt may benefit from HHPT services to focus on age related balance deficits & deconditioning. PT to complete current orders, please re-consult if new needs arise.  Lavone Nian, PT, DPT 04/15/22, 9:31 AM   Waunita Schooner 04/15/2022, 9:29 AM

## 2022-04-15 NOTE — TOC Transition Note (Signed)
Transition of Care [facility]) - CM/SW Discharge Note   Patient Details  Name: [name] [name] MRN: [id number] Date of Birth: 1941-02-11  Transition of Care [facility] CM/SW Contact:  Elliot Gurney [place], [place] Phone Number: 04/15/2022, 11:15 AM   Clinical Narrative:    Patient to discharge home today, patient's daughter to provide transport. Patient scheduled to receive HD on Monday, Wednesdays and Fridays through [facility]. [place] set up with [facility] for PT, OT, RN and an aid. Start of care 04/18/22, MD aware of scheduled date.  Brittan Mapel, LCSW Transition of Care [phone]      Barriers to Discharge: Continued Medical Work up   Patient Goals and CMS Choice        Discharge Placement                       Discharge Plan and Services                                     Social Determinants of Health (SDOH) Interventions     Readmission Risk Interventions     No data to display

## 2022-04-15 NOTE — Plan of Care (Signed)

## 2022-04-17 LAB — HEPATITIS B E ANTIBODY: Hep B E Ab: NEGATIVE

## 2022-04-19 ENCOUNTER — Encounter: Payer: Self-pay | Admitting: Oncology

## 2022-04-20 ENCOUNTER — Telehealth: Payer: Self-pay

## 2022-04-20 NOTE — Telephone Encounter (Signed)
Message left for the patient to call to schedule a post op visit with Dr Hampton Abbot on 9/6 or 9/8 for PD Catheter Revision.

## 2022-04-26 ENCOUNTER — Encounter: Payer: Medicare Other | Admitting: Surgery

## 2022-04-26 ENCOUNTER — Ambulatory Visit (INDEPENDENT_AMBULATORY_CARE_PROVIDER_SITE_OTHER): Payer: Medicare Other | Admitting: Urology

## 2022-04-26 ENCOUNTER — Encounter: Payer: Self-pay | Admitting: Urology

## 2022-04-26 VITALS — BP 133/66 | HR 56 | Ht 69.0 in | Wt 179.0 lb

## 2022-04-26 DIAGNOSIS — C61 Malignant neoplasm of prostate: Secondary | ICD-10-CM

## 2022-04-26 DIAGNOSIS — Q632 Ectopic kidney: Secondary | ICD-10-CM | POA: Diagnosis not present

## 2022-04-26 DIAGNOSIS — N2889 Other specified disorders of kidney and ureter: Secondary | ICD-10-CM

## 2022-04-26 NOTE — Progress Notes (Signed)
04/26/2022 1:59 PM   [name] [name] 1941-02-11 [id number]  Referring provider: Sidney Ace, MD [place],  [place] [address]  Chief Complaint  Patient presents with   renal mass    HPI: 81 year old male with left-sided renal mass and right pelvic kidney who presents today for further evaluation of this.  Specifically, there is been notable enlargement of this.  He was recently admitted to the hospital for complications of his peritoneal dialysis catheter.  As part of this evaluation, he had a CT abdomen pelvis on 04/14/2022 which showed a 4.5 x 4.0 heterogeneous mass in the interpolar left kidney.  Appears that this has been evaluated multiple times in the past with serial MRIs.  His most recent MRI was 02/2021 at which time it was felt to be proteinaceous hemorrhagic left-sided renal cyst rather than a solid mass.  At the time, and measured 3.4 x 2.7.  He has a personal history of end-stage renal disease.  He recently had a peritoneal dialysis catheter placed which had to be revised several times.  He started on hemodialysis via a access catheter about 4 5 months ago.  He does continue to make urine.  This is a patient of Dr. Yves Dill.  He is being followed for history of elevated PSA.  Most recently, he underwent [facility] prostate biopsies in 2021.  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  I do not have access to these records.  The patient reports that he started with injections and now is taking a pill daily for this (Casodex).  He reports that he is being actively managed for this issue.  He did not have radiation.  I do not have access to his PSA results.  He would like to continue to follow with Dr. Yves Dill for this.  PMH: Past Medical History:  Diagnosis Date   Anemia    Arthritis    Cancer ([place])    Chronic kidney disease    Stage 4 CKD   GERD (gastroesophageal reflux disease)    h/o   Gout    Hyperlipidemia, mixed    Hypertension     Sleep apnea    h/o no cpap    Surgical History: Past Surgical History:  Procedure Laterality Date   APPENDECTOMY     Bunion Removal Right    CAPD INSERTION N/A 11/23/2021   Procedure: LAPAROSCOPIC INSERTION CONTINUOUS AMBULATORY PERITONEAL DIALYSIS  (CAPD) CATHETER;  Surgeon: Olean Ree, MD;  Location: [facility] ORS;  Service: General;  Laterality: N/A;   CAPD REVISION N/A 01/31/2022   Procedure: LAPAROSCOPIC REVISION CONTINUOUS AMBULATORY PERITONEAL DIALYSIS  (CAPD) CATHETER;  Surgeon: Olean Ree, MD;  Location: [facility] ORS;  Service: General;  Laterality: N/A;   CAPD REVISION N/A 04/06/2022   Procedure: LAPAROSCOPIC REVISION CONTINUOUS AMBULATORY PERITONEAL DIALYSIS  (CAPD) CATHETER, replacement;  Surgeon: Olean Ree, MD;  Location: [facility] ORS;  Service: General;  Laterality: N/A;   CAPD REVISION N/A 04/14/2022   Procedure: LAPAROSCOPIC ASSISTED CONTINUOUS AMBULATORY PERITONEAL DIALYSIS  (CAPD) CATHETER REMOVAL;  Surgeon: Olean Ree, MD;  Location: [facility] ORS;  Service: General;  Laterality: N/A;   COLONOSCOPY WITH PROPOFOL N/A 06/14/2015   Procedure: COLONOSCOPY WITH PROPOFOL;  Surgeon: Lollie Sails, MD;  Location: [facility] ENDOSCOPY;  Service: Endoscopy;  Laterality: N/A;   DIALYSIS/PERMA CATHETER INSERTION N/A 10/03/2021   Procedure: DIALYSIS/PERMA CATHETER INSERTION;  Surgeon: Algernon Huxley, MD;  Location: [facility];  Service: Cardiovascular;  Laterality: N/A;   EYE SURGERY  HERNIA REPAIR     abd   INSERTION OF MESH  11/23/2021   Procedure: INSERTION OF MESH;  Surgeon: Olean Ree, MD;  Location: [facility] ORS;  Service: General;;   PROSTATE BIOPSY N/A 03/27/2017   Procedure: PROSTATE BIOPSY-URO NAV;  Surgeon: Royston Cowper, MD;  Location: [facility] ORS;  Service: Urology;  Laterality: N/A;   PROSTATE BIOPSY N/A 09/11/2019   Procedure: PROSTATE BIOPSY Pearson Forster;  Surgeon: Royston Cowper, MD;  Location: [facility] ORS;  Service: Urology;  Laterality: N/A;   RETINAL DETACHMENT  SURGERY Left    TRANSURETHRAL RESECTION OF PROSTATE     XI ROBOTIC ASSISTED VENTRAL HERNIA N/A 11/23/2021   Procedure: XI ROBOTIC ASSISTED VENTRAL HERNIA, incisional;  Surgeon: Olean Ree, MD;  Location: [facility] ORS;  Service: General;  Laterality: N/A;    Home Medications:  Allergies as of 04/26/2022       Reactions   Fish Allergy Swelling   Ciprofloxacin    Unknown reaction   Doxazosin Other (See Comments)   unknown        Medication List        Accurate as of April 26, 2022  1:59 PM. If you have any questions, ask your nurse or doctor.          acetaminophen 500 MG tablet Commonly known as: TYLENOL Take 2 tablets (1,000 mg total) by mouth every 6 (six) hours as needed for mild pain.   amLODipine 10 MG tablet Commonly known as: NORVASC Take 10 mg by mouth every morning.   azelastine 0.1 % nasal spray Commonly known as: ASTELIN Place 1 spray into both nostrils 2 (two) times daily as needed for rhinitis. Use in each nostril as directed   bicalutamide 50 MG tablet Commonly known as: CASODEX Take 50 mg by mouth every morning.   cinacalcet 30 MG tablet Commonly known as: SENSIPAR Take 30 mg by mouth daily.   enalapril 20 MG tablet Commonly known as: VASOTEC Take 20 mg by mouth 2 (two) times daily.   fluticasone 50 MCG/ACT nasal spray Commonly known as: FLONASE Place 1 spray into both nostrils daily as needed for allergies or rhinitis.   hydrALAZINE 100 MG tablet Commonly known as: APRESOLINE Take 100 mg by mouth 3 (three) times daily.   lovastatin 20 MG tablet Commonly known as: MEVACOR Take 20 mg by mouth at bedtime.   metoprolol tartrate 50 MG tablet Commonly known as: LOPRESSOR Take 50 mg by mouth 2 (two) times daily.   ondansetron 4 MG tablet Commonly known as: ZOFRAN Take 1 tablet (4 mg total) by mouth every 6 (six) hours as needed for nausea.   oxyCODONE 5 MG immediate release tablet Commonly known as: Oxy IR/ROXICODONE Take 1-2 tablets  (5-10 mg total) by mouth every 4 (four) hours as needed for moderate pain or severe pain.   polyethylene glycol 17 g packet Commonly known as: MIRALAX / GLYCOLAX Take 17 g by mouth 3 (three) times a week.   sodium bicarbonate 650 MG tablet Take 650 mg by mouth 2 (two) times daily.   Uloric 40 MG tablet Generic drug: febuxostat Take 40 mg by mouth every morning.        Allergies:  Allergies  Allergen Reactions   Fish Allergy Swelling   Ciprofloxacin     Unknown reaction   Doxazosin Other (See Comments)    unknown    Family History: Family History  Problem Relation Age of Onset   Leukemia Father    Leukemia Brother  Social History:  reports that he has never smoked. He has never used smokeless tobacco. He reports that he does not drink alcohol and does not use drugs.   Physical Exam: BP 133/66   Pulse (!) 56   Ht '5\' 9"'$  (1.753 m)   Wt 179 lb (81.2 kg)   BMI 26.43 kg/m   Constitutional:  Alert and oriented, No acute distress. HEENT: [place] AT, moist mucus membranes.  Trachea midline, no masses. Cardiovascular: No clubbing, cyanosis, or edema. Skin: No rashes, bruises or suspicious lesions. Neurologic: Grossly intact, no focal deficits, moving all 4 extremities. Psychiatric: Normal mood and affect.  Laboratory Data: Lab Results  Component Value Date   WBC 7.6 04/15/2022   HGB 7.5 (L) 04/15/2022   HCT 23.0 (L) 04/15/2022   MCV 89.5 04/15/2022   PLT 95 (L) 04/15/2022    Lab Results  Component Value Date   CREATININE 3.13 (H) 04/15/2022    CT abdomen pelvis without contrast IMPRESSION: 1. No acute intra-abdominal or pelvic pathology. No bowel obstruction. 2. Disconnected percutaneous catheter tubing in the right anterior pelvic wall. Recommend re-evaluation and reconnecting. No drainable fluid collection or abscess. 3. Aortic Atherosclerosis (ICD10-I70.0).     Electronically Signed   By: Anner Crete M.D.   On: 04/14/2022 01:32  I personally  reviewed the above CT scan images and agree with the radiologic interpretation.  Assessment & Plan:    1. Renal mass Previously characterized felt to be proteinaceous hemorrhagic cyst  There is been interval enlargement now 4.0 x 4.5 cm but not characterized on CT scan.  Have recommended a repeat MRI to ensure that there is no concern for malignant potential.  Plan to follow-up with MRI without contrast secondary to end-stage renal disease - MR Abdomen Wo Contrast; Future  2. Pelvic kidney Incidental/congenital  3. Prostate cancer [facility]) Managed by Dr. Yves Dill, appears to be managed medically presumably on ADT plus Casodex  Confirmed patient would like to continue to follow-up with his urologist   Return in about 1 month (around 05/26/2022). MRI  Hollice Espy, MD  [facility] [address] [place], [place] [address] [phone]

## 2022-05-11 ENCOUNTER — Ambulatory Visit
Admission: RE | Admit: 2022-05-11 | Discharge: 2022-05-11 | Disposition: A | Discharge status: Home / Self Care | Payer: Medicare Other | Source: Ambulatory Visit | Attending: Nephrology | Admitting: Nephrology

## 2022-05-11 VITALS — BP 125/56 | HR 58 | Temp 97.3°F | Resp 16

## 2022-05-11 DIAGNOSIS — D649 Anemia, unspecified: Secondary | ICD-10-CM

## 2022-05-11 DIAGNOSIS — D631 Anemia in chronic kidney disease: Secondary | ICD-10-CM | POA: Diagnosis not present

## 2022-05-11 DIAGNOSIS — N186 End stage renal disease: Secondary | ICD-10-CM | POA: Insufficient documentation

## 2022-05-11 LAB — HEMOGLOBIN AND HEMATOCRIT, BLOOD
HCT: 18.7 % — ABNORMAL LOW (ref 39.0–52.0)
Hemoglobin: 6.2 g/dL — ABNORMAL LOW (ref 13.0–17.0)

## 2022-05-11 LAB — PREPARE RBC (CROSSMATCH)

## 2022-05-11 MED ORDER — SODIUM CHLORIDE 0.9% IV SOLUTION: Freq: Once | INTRAVENOUS | Status: AC

## 2022-05-12 LAB — TYPE AND SCREEN
ABO/RH(D): O POS
Antibody Screen: NEGATIVE
Unit division: 0

## 2022-05-12 LAB — BPAM RBC
Blood Product Expiration Date: 202310242359
ISSUE DATE / TIME: 202309211050
Unit Type and Rh: 5100

## 2022-05-23 ENCOUNTER — Other Ambulatory Visit: Payer: Medicare Other

## 2022-05-24 ENCOUNTER — Ambulatory Visit: Payer: Medicare Other | Admitting: Urology

## 2022-05-30 ENCOUNTER — Ambulatory Visit
Admission: RE | Admit: 2022-05-30 | Discharge: 2022-05-30 | Disposition: A | Discharge status: Home / Self Care | Payer: Medicare Other | Source: Ambulatory Visit | Attending: Urology | Admitting: Urology

## 2022-05-30 DIAGNOSIS — N2889 Other specified disorders of kidney and ureter: Secondary | ICD-10-CM | POA: Diagnosis present

## 2022-06-06 ENCOUNTER — Ambulatory Visit (INDEPENDENT_AMBULATORY_CARE_PROVIDER_SITE_OTHER): Payer: Medicare Other | Admitting: Urology

## 2022-06-06 VITALS — BP 119/66 | HR 61 | Ht 69.0 in | Wt 174.7 lb

## 2022-06-06 DIAGNOSIS — N2889 Other specified disorders of kidney and ureter: Secondary | ICD-10-CM

## 2022-06-06 DIAGNOSIS — Q632 Ectopic kidney: Secondary | ICD-10-CM | POA: Diagnosis not present

## 2022-06-06 DIAGNOSIS — C61 Malignant neoplasm of prostate: Secondary | ICD-10-CM

## 2022-06-06 NOTE — Progress Notes (Unsigned)
06/06/2022 5:49 PM   [name] [name] [birth year] [id number]  Referring provider: Dion Body, MD [facility] [place],  [place] [address]  Chief Complaint  Patient presents with   Results    MRI    HPI: 81 year old male who returns today for MRI results.  He was recently admitted to the hospital for complications of his peritoneal dialysis catheter.  As part of this evaluation, he had a CT abdomen pelvis on 04/14/2022 which showed a 4.5 x 4.0 heterogeneous mass in the interpolar left kidney.  Appears that this has been evaluated multiple times in the past with serial MRIs.  His most recent MRI was 02/2021 at which time it was felt to be proteinaceous hemorrhagic left-sided renal cyst rather than a solid mass.  At the time, and measured 3.4 x 2.7.  He returns today with follow-up MRI completed on 05/31/2022.  This shows slight enlargement of the mass measuring 3.7 x 3.0 cm in comparison to the above.  It is stable in the craniocaudal direction.  It again demonstrates this similar heterogeneous T1 hyperintensity and primary T2 hypointensity favoring hemorrhagic/proteinaceous cyst.  No flank pain or gross hematuria.  He also has a personal history of prostate cancer managed by Dr. Eliberto Ivory and follows up specifically today for management of his renal mass.   PMH: Past Medical History:  Diagnosis Date   Anemia    Arthritis    Cancer ([place])    Chronic kidney disease    Stage 4 CKD   GERD (gastroesophageal reflux disease)    h/o   Gout    Hyperlipidemia, mixed    Hypertension    Sleep apnea    h/o no cpap    Surgical History: Past Surgical History:  Procedure Laterality Date   APPENDECTOMY     Bunion Removal Right    CAPD INSERTION N/A 11/23/2021   Procedure: LAPAROSCOPIC INSERTION CONTINUOUS AMBULATORY PERITONEAL DIALYSIS  (CAPD) CATHETER;  Surgeon: Olean Ree, MD;  Location: [facility] ORS;  Service: General;  Laterality: N/A;   CAPD REVISION N/A  01/31/2022   Procedure: LAPAROSCOPIC REVISION CONTINUOUS AMBULATORY PERITONEAL DIALYSIS  (CAPD) CATHETER;  Surgeon: Olean Ree, MD;  Location: [facility];  Service: General;  Laterality: N/A;   CAPD REVISION N/A 04/06/2022   Procedure: LAPAROSCOPIC REVISION CONTINUOUS AMBULATORY PERITONEAL DIALYSIS  (CAPD) CATHETER, replacement;  Surgeon: Olean Ree, MD;  Location: [facility]S;  Service: General;  Laterality: N/A;   CAPD REVISION N/A 04/14/2022   Procedure: LAPAROSCOPIC ASSISTED CONTINUOUS AMBULATORY PERITONEAL DIALYSIS  (CAPD) CATHETER REMOVAL;  Surgeon: Olean Ree, MD;  Location: [facility] ORS;  Service: General;  Laterality: N/A;   COLONOSCOPY WITH PROPOFOL N/A 06/14/2015   Procedure: COLONOSCOPY WITH PROPOFOL;  Surgeon: Lollie Sails, MD;  Location: [facility] ENDOSCOPY;  Service: Endoscopy;  Laterality: N/A;   DIALYSIS/PERMA CATHETER INSERTION N/A 10/03/2021   Procedure: DIALYSIS/PERMA CATHETER INSERTION;  Surgeon: Algernon Huxley, MD;  Location: [facility];  Service: Cardiovascular;  Laterality: N/A;   EYE SURGERY     HERNIA REPAIR     abd   INSERTION OF MESH  11/23/2021   Procedure: INSERTION OF MESH;  Surgeon: Olean Ree, MD;  Location: [facility] ORS;  Service: General;;   PROSTATE BIOPSY N/A 03/27/2017   Procedure: PROSTATE BIOPSY-URO NAV;  Surgeon: Royston Cowper, MD;  Location: [facility] ORS;  Service: Urology;  Laterality: N/A;   PROSTATE BIOPSY N/A 09/11/2019   Procedure: PROSTATE BIOPSY Pearson Forster;  Surgeon: Royston Cowper, MD;  Location: [facility];  Service: Urology;  Laterality: N/A;   RETINAL DETACHMENT SURGERY Left    TRANSURETHRAL RESECTION OF PROSTATE     XI ROBOTIC ASSISTED VENTRAL HERNIA N/A 11/23/2021   Procedure: XI ROBOTIC ASSISTED VENTRAL HERNIA, incisional;  Surgeon: Olean Ree, MD;  Location: [facility] ORS;  Service: General;  Laterality: N/A;    Home Medications:  Allergies as of 06/06/2022       Reactions   Fish Allergy Swelling   Ciprofloxacin    Unknown  reaction   Doxazosin Other (See Comments)   unknown        Medication List        Accurate as of June 06, 2022 11:59 PM. If you have any questions, ask your nurse or doctor.          acetaminophen 500 MG tablet Commonly known as: TYLENOL Take 2 tablets (1,000 mg total) by mouth every 6 (six) hours as needed for mild pain.   amLODipine 10 MG tablet Commonly known as: NORVASC Take 10 mg by mouth every morning.   azelastine 0.1 % nasal spray Commonly known as: ASTELIN Place 1 spray into both nostrils 2 (two) times daily as needed for rhinitis. Use in each nostril as directed   bicalutamide 50 MG tablet Commonly known as: CASODEX Take 50 mg by mouth every morning.   cinacalcet 30 MG tablet Commonly known as: SENSIPAR Take 30 mg by mouth daily.   enalapril 20 MG tablet Commonly known as: VASOTEC Take 20 mg by mouth 2 (two) times daily.   fluticasone 50 MCG/ACT nasal spray Commonly known as: FLONASE Place 1 spray into both nostrils daily as needed for allergies or rhinitis.   hydrALAZINE 100 MG tablet Commonly known as: APRESOLINE Take 100 mg by mouth 3 (three) times daily.   lovastatin 20 MG tablet Commonly known as: MEVACOR Take 20 mg by mouth at bedtime.   metoprolol tartrate 50 MG tablet Commonly known as: LOPRESSOR Take 50 mg by mouth 2 (two) times daily.   ondansetron 4 MG tablet Commonly known as: ZOFRAN Take 1 tablet (4 mg total) by mouth every 6 (six) hours as needed for nausea.   oxyCODONE 5 MG immediate release tablet Commonly known as: Oxy IR/ROXICODONE Take 1-2 tablets (5-10 mg total) by mouth every 4 (four) hours as needed for moderate pain or severe pain.   polyethylene glycol 17 g packet Commonly known as: MIRALAX / GLYCOLAX Take 17 g by mouth 3 (three) times a week.   sodium bicarbonate 650 MG tablet Take 650 mg by mouth 2 (two) times daily.   Uloric 40 MG tablet Generic drug: febuxostat Take 40 mg by mouth every morning.         Allergies:  Allergies  Allergen Reactions   Fish Allergy Swelling   Ciprofloxacin     Unknown reaction   Doxazosin Other (See Comments)    unknown    Family History: Family History  Problem Relation Age of Onset   Leukemia Father    Leukemia Brother     Social History:  reports that he has never smoked. He has never used smokeless tobacco. He reports that he does not drink alcohol and does not use drugs.   Physical Exam: BP 119/66   Pulse 61   Ht '5\' 9"'$  (1.753 m)   Wt 174 lb 11.2 oz (79.2 kg)   BMI 25.80 kg/m   Constitutional:  Alert and oriented, No acute distress. HEENT: [place] AT, moist mucus membranes.  Trachea  midline, no masses. Neurologic: Grossly intact, no focal deficits, moving all 4 extremities. Psychiatric: Normal mood and affect.  Laboratory Data: Lab Results  Component Value Date   WBC 7.6 04/15/2022   HGB 6.2 (L) 05/11/2022   HCT 18.7 (L) 05/11/2022   MCV 89.5 04/15/2022   PLT 95 (L) 04/15/2022    Lab Results  Component Value Date   CREATININE 3.13 (H) 04/15/2022    Pertinent Imaging: IMPRESSION: 1. Degraded exam secondary to lack of IV contrast and minimal motion. 2. The complex interpolar left renal lesion measures similar to slightly larger compared to 02/24/2021. This remains indeterminate on this noncontrast exam. Favored to represent a hemorrhagic/proteinaceous cyst. 3. Other bilateral renal lesions are likely simple and complex cysts, but technically incompletely characterized. 4. Cholelithiasis 5. Hemosiderosis 6. Interval ventral abdominal wall hernia repair.     Electronically Signed   By: Abigail Miyamoto M.D.   On: 05/31/2022 10:36   The above MRI was personally reviewed.  Agree with radiology interpretation.  Assessment & Plan:    1. Renal mass MRI today again fevers hemorrhagic proteinaceous cyst within the pelvic kidney.  Very minimal interval enlargement.  Given his medical comorbidities and age, recommend continued  surveillance.  Would recommend repeat imaging in a year.  Given that he already has a urologist Dr. Eliberto Ivory for which he is following for his prostate cancer, will defer this repeat imaging to him.  I made the patient aware of these recommendations and he will discuss it with Dr. Eliberto Ivory.  2. Pelvic kidney As above  3. Prostate cancer [facility]) Managed by Dr. Eliberto Ivory   Return if symptoms worsen or fail to improve.  Hollice Espy, MD  [facility] [address] [place], [place] [address] [phone]

## 2022-08-08 ENCOUNTER — Telehealth: Payer: Self-pay

## 2022-08-08 NOTE — Telephone Encounter (Signed)
Faxed medical records to [employer] at [phone].

## 2022-08-22 ENCOUNTER — Other Ambulatory Visit (INDEPENDENT_AMBULATORY_CARE_PROVIDER_SITE_OTHER): Payer: Self-pay | Admitting: Nurse Practitioner

## 2022-08-22 DIAGNOSIS — N186 End stage renal disease: Secondary | ICD-10-CM

## 2022-08-25 ENCOUNTER — Ambulatory Visit (INDEPENDENT_AMBULATORY_CARE_PROVIDER_SITE_OTHER): Payer: Medicare Other | Admitting: Vascular Surgery

## 2022-08-25 ENCOUNTER — Encounter (INDEPENDENT_AMBULATORY_CARE_PROVIDER_SITE_OTHER): Payer: Self-pay | Admitting: Vascular Surgery

## 2022-08-25 ENCOUNTER — Ambulatory Visit (INDEPENDENT_AMBULATORY_CARE_PROVIDER_SITE_OTHER): Payer: Medicare Other

## 2022-08-25 VITALS — BP 145/68 | HR 52 | Resp 16 | Wt 174.8 lb

## 2022-08-25 DIAGNOSIS — E1122 Type 2 diabetes mellitus with diabetic chronic kidney disease: Secondary | ICD-10-CM | POA: Diagnosis not present

## 2022-08-25 DIAGNOSIS — Z992 Dependence on renal dialysis: Secondary | ICD-10-CM

## 2022-08-25 DIAGNOSIS — I10 Essential (primary) hypertension: Secondary | ICD-10-CM | POA: Diagnosis not present

## 2022-08-25 DIAGNOSIS — N186 End stage renal disease: Secondary | ICD-10-CM

## 2022-08-25 DIAGNOSIS — N185 Chronic kidney disease, stage 5: Secondary | ICD-10-CM

## 2022-08-25 DIAGNOSIS — E782 Mixed hyperlipidemia: Secondary | ICD-10-CM

## 2022-08-25 NOTE — Assessment & Plan Note (Signed)
blood pressure control important in reducing the progression of atherosclerotic disease. On appropriate oral medications.  

## 2022-08-25 NOTE — Assessment & Plan Note (Signed)
blood glucose control important in reducing the progression of atherosclerotic disease. Also, involved in wound healing. On appropriate medications.  

## 2022-08-25 NOTE — Assessment & Plan Note (Signed)
lipid control important in reducing the progression of atherosclerotic disease. Continue statin therapy  

## 2022-08-25 NOTE — Assessment & Plan Note (Signed)
Noninvasive studies were performed today.  His arterial flow was multiphasic and without obvious stenosis in either upper extremity.  On the left arm, the cephalic vein appeared too small for fistula creation and the basilic vein appears adequate for fistula creation.  On the right arm, the cephalic vein appeared to be marginal to adequate for fistula creation in the upper arm and the basilic vein was adequate for fistula creation. We had a long discussion today regarding the findings and our best options for dialysis.  I think evaluating him for an AV fistula in the right arm would be prudent.  If the cephalic vein is usable, this would be used preferentially but if it is not, the basilic vein will be used.  We discussed that this would be a two-stage operation.  He is the basilic vein.  He voices understanding and is agreeable to proceed.

## 2022-08-25 NOTE — Progress Notes (Signed)
MRN : [id number]  [name] [name] is a 82 y.o. (1941/03/14) male who presents with chief complaint of  Chief Complaint  Patient presents with   Follow-up    Ref Lateef consult vein mapp for permanent access  .  History of Present Illness: Patient returns today in follow up of his renal failure and dialysis access.  He has been catheter dependent since initiating dialysis last year.  He currently has no extremity access placement and has never had 1 before.  He is right-hand dominant.  Noninvasive studies were performed today.  His arterial flow was multiphasic and without obvious stenosis in either upper extremity.  On the left arm, the cephalic vein appeared too small for fistula creation and the basilic vein appears adequate for fistula creation.  On the right arm, the cephalic vein appeared to be marginal to adequate for fistula creation in the upper arm and the basilic vein was adequate for fistula creation.  Current Outpatient Medications  Medication Sig Dispense Refill   acetaminophen (TYLENOL) 500 MG tablet Take 2 tablets (1,000 mg total) by mouth every 6 (six) hours as needed for mild pain.     amLODipine (NORVASC) 10 MG tablet Take 10 mg by mouth every morning.     aspirin EC 81 MG tablet Take 81 mg by mouth daily. Swallow whole.     azelastine (ASTELIN) 0.1 % nasal spray Place 1 spray into both nostrils 2 (two) times daily as needed for rhinitis. Use in each nostril as directed     bicalutamide (CASODEX) 50 MG tablet Take 50 mg by mouth every morning.     cinacalcet (SENSIPAR) 30 MG tablet Take 30 mg by mouth daily.     enalapril (VASOTEC) 20 MG tablet Take 20 mg by mouth 2 (two) times daily.      fluticasone (FLONASE) 50 MCG/ACT nasal spray Place 1 spray into both nostrils daily as needed for allergies or rhinitis.     hydrALAZINE (APRESOLINE) 100 MG tablet Take 100 mg by mouth 3 (three) times daily.     lovastatin (MEVACOR) 20 MG tablet Take 20 mg by mouth at bedtime.      metoprolol (LOPRESSOR) 50 MG tablet Take 50 mg by mouth 2 (two) times daily.     ondansetron (ZOFRAN) 4 MG tablet Take 1 tablet (4 mg total) by mouth every 6 (six) hours as needed for nausea. 20 tablet 0   oxyCODONE (OXY IR/ROXICODONE) 5 MG immediate release tablet Take 1-2 tablets (5-10 mg total) by mouth every 4 (four) hours as needed for moderate pain or severe pain. 10 tablet 0   polyethylene glycol (MIRALAX / GLYCOLAX) 17 g packet Take 17 g by mouth 3 (three) times a week.     sodium bicarbonate 650 MG tablet Take 650 mg by mouth 2 (two) times daily.      ULORIC 40 MG tablet Take 40 mg by mouth every morning.     No current facility-administered medications for this visit.    Past Medical History:  Diagnosis Date   Anemia    Arthritis    Cancer ([place])    Chronic kidney disease    Stage 4 CKD   GERD (gastroesophageal reflux disease)    h/o   Gout    Hyperlipidemia, mixed    Hypertension    Sleep apnea    h/o no cpap    Past Surgical History:  Procedure Laterality Date   APPENDECTOMY     Bunion Removal Right  CAPD INSERTION N/A 11/23/2021   Procedure: LAPAROSCOPIC INSERTION CONTINUOUS AMBULATORY PERITONEAL DIALYSIS  (CAPD) CATHETER;  Surgeon: Olean Ree, MD;  Location: [facility];  Service: General;  Laterality: N/A;   CAPD REVISION N/A 01/31/2022   Procedure: LAPAROSCOPIC REVISION CONTINUOUS AMBULATORY PERITONEAL DIALYSIS  (CAPD) CATHETER;  Surgeon: Olean Ree, MD;  Location: [facility];  Service: General;  Laterality: N/A;   CAPD REVISION N/A 04/06/2022   Procedure: LAPAROSCOPIC REVISION CONTINUOUS AMBULATORY PERITONEAL DIALYSIS  (CAPD) CATHETER, replacement;  Surgeon: Olean Ree, MD;  Location: [facility];  Service: General;  Laterality: N/A;   CAPD REVISION N/A 04/14/2022   Procedure: LAPAROSCOPIC ASSISTED CONTINUOUS AMBULATORY PERITONEAL DIALYSIS  (CAPD) CATHETER REMOVAL;  Surgeon: Olean Ree, MD;  Location: [facility];  Service: General;  Laterality: N/A;   COLONOSCOPY  WITH PROPOFOL N/A 06/14/2015   Procedure: COLONOSCOPY WITH PROPOFOL;  Surgeon: Lollie Sails, MD;  Location: [facility];  Service: Endoscopy;  Laterality: N/A;   DIALYSIS/PERMA CATHETER INSERTION N/A 10/03/2021   Procedure: DIALYSIS/PERMA CATHETER INSERTION;  Surgeon: Algernon Huxley, MD;  Location: [facility];  Service: Cardiovascular;  Laterality: N/A;   EYE SURGERY     HERNIA REPAIR     abd   INSERTION OF MESH  11/23/2021   Procedure: INSERTION OF MESH;  Surgeon: Olean Ree, MD;  Location: [facility];  Service: General;;   PROSTATE BIOPSY N/A 03/27/2017   Procedure: PROSTATE BIOPSY-URO NAV;  Surgeon: Royston Cowper, MD;  Location: [facility];  Service: Urology;  Laterality: N/A;   PROSTATE BIOPSY N/A 09/11/2019   Procedure: PROSTATE BIOPSY Pearson [place];  Surgeon: Royston Cowper, MD;  Location: [facility];  Service: Urology;  Laterality: N/A;   RETINAL DETACHMENT SURGERY Left    TRANSURETHRAL RESECTION OF PROSTATE     XI ROBOTIC ASSISTED VENTRAL HERNIA N/A 11/23/2021   Procedure: XI ROBOTIC ASSISTED VENTRAL HERNIA, incisional;  Surgeon: Olean Ree, MD;  Location: [facility];  Service: General;  Laterality: N/A;     Social History   Tobacco Use   Smoking status: Never   Smokeless tobacco: Never  Vaping Use   Vaping Use: Never used  Substance Use Topics   Alcohol use: Never   Drug use: No       Family History  Problem Relation Age of Onset   Leukemia Father    Leukemia Brother   No bleeding or clotting disorders  Allergies  Allergen Reactions   Fish Allergy Swelling   Ciprofloxacin     Unknown reaction   Doxazosin Other (See Comments)    unknown     REVIEW OF SYSTEMS (Negative unless checked)  Constitutional: '[]'$ Weight loss  '[]'$ Fever  '[]'$ Chills Cardiac: '[]'$ Chest pain   '[]'$ Chest pressure   '[]'$ Palpitations   '[]'$ Shortness of breath when laying flat   '[]'$ Shortness of breath at rest   '[]'$ Shortness of breath with exertion. Vascular:  '[]'$ Pain in legs with walking    '[]'$ Pain in legs at rest   '[]'$ Pain in legs when laying flat   '[]'$ Claudication   '[]'$ Pain in feet when walking  '[]'$ Pain in feet at rest  '[]'$ Pain in feet when laying flat   '[]'$ History of DVT   '[]'$ Phlebitis   '[]'$ Swelling in legs   '[]'$ Varicose veins   '[]'$ Non-healing ulcers Pulmonary:   '[]'$ Uses home oxygen   '[]'$ Productive cough   '[]'$ Hemoptysis   '[]'$ Wheeze  '[]'$ COPD   '[]'$ Asthma Neurologic:  '[]'$ Dizziness  '[]'$ Blackouts   '[]'$ Seizures   '[]'$ History of stroke   '[]'$ History of TIA  '[]'$ Aphasia   '[]'$   Temporary blindness   '[]'$ Dysphagia   '[]'$ Weakness or numbness in arms   '[]'$ Weakness or numbness in legs Musculoskeletal:  '[x]'$ Arthritis   '[]'$ Joint swelling   '[]'$ Joint pain   '[]'$ Low back pain Hematologic:  '[]'$ Easy bruising  '[]'$ Easy bleeding   '[]'$ Hypercoagulable state   '[x]'$ Anemic   Gastrointestinal:  '[]'$ Blood in stool   '[]'$ Vomiting blood  '[x]'$ Gastroesophageal reflux/heartburn   '[]'$ Abdominal pain Genitourinary:  '[x]'$ Chronic kidney disease   '[]'$ Difficult urination  '[]'$ Frequent urination  '[]'$ Burning with urination   '[]'$ Hematuria Skin:  '[]'$ Rashes   '[]'$ Ulcers   '[]'$ Wounds Psychological:  '[]'$ History of anxiety   '[]'$  History of major depression.  Physical Examination  BP (!) 145/68 (BP Location: Right Arm)   Pulse (!) 52   Resp 16   Wt 174 lb 12.8 oz (79.3 kg)   BMI 25.81 kg/m  Gen:  WD/WN, NAD. Appears younger than stated age. Head: [place]/AT, No temporalis wasting. Ear/Nose/Throat: Hearing grossly intact, nares w/o erythema or drainage Eyes: Conjunctiva clear. Sclera non-icteric Neck: Supple.  Trachea midline Pulmonary:  Good air movement, no use of accessory muscles.  Cardiac: RRR, no JVD Vascular:  Vessel Right Left  Radial Palpable Palpable                   Musculoskeletal: M/S 5/5 throughout.  No deformity or atrophy. No edema. Neurologic: Sensation grossly intact in extremities.  Symmetrical.  Speech is fluent.  Psychiatric: Judgment intact, Mood & affect appropriate for pt's clinical situation. Dermatologic: No rashes or ulcers noted.  No cellulitis or open  wounds.      Labs No results found for this or any previous visit (from the past 2160 hour(s)).  Radiology No results found.  Assessment/Plan  ESRD (end stage renal disease) on dialysis ([place]) Noninvasive studies were performed today.  His arterial flow was multiphasic and without obvious stenosis in either upper extremity.  On the left arm, the cephalic vein appeared too small for fistula creation and the basilic vein appears adequate for fistula creation.  On the right arm, the cephalic vein appeared to be marginal to adequate for fistula creation in the upper arm and the basilic vein was adequate for fistula creation. We had a long discussion today regarding the findings and our best options for dialysis.  I think evaluating him for an AV fistula in the right arm would be prudent.  If the cephalic vein is usable, this would be used preferentially but if it is not, the basilic vein will be used.  We discussed that this would be a two-stage operation.  He is the basilic vein.  He voices understanding and is agreeable to proceed.  Type 2 diabetes mellitus with chronic kidney disease (HCC) blood glucose control important in reducing the progression of atherosclerotic disease. Also, involved in wound healing. On appropriate medications.   Benign essential hypertension blood pressure control important in reducing the progression of atherosclerotic disease. On appropriate oral medications.   Mixed hyperlipidemia lipid control important in reducing the progression of atherosclerotic disease. Continue statin therapy    [name], MD  08/25/2022 12:19 PM    This note was created with Dragon medical transcription system.  Any errors from dictation are purely unintentional

## 2022-10-10 ENCOUNTER — Encounter: Payer: Self-pay | Admitting: Oncology

## 2022-10-10 ENCOUNTER — Telehealth (INDEPENDENT_AMBULATORY_CARE_PROVIDER_SITE_OTHER): Payer: Self-pay

## 2022-10-10 NOTE — Telephone Encounter (Signed)
Spoke with [facility] regarding the patient being scheduled for a right arm AV fistula. Patient has been scheduled on 10/26/22 at the [facility]. Pre-op phone call is on 10/18/22 between 8-1 pm. Pre-surgical instructions will be faxed to [facility] as requested.

## 2022-10-18 ENCOUNTER — Encounter
Admission: RE | Admit: 2022-10-18 | Discharge: 2022-10-18 | Disposition: A | Discharge status: Home / Self Care | Payer: Medicare Other | Source: Ambulatory Visit | Attending: Vascular Surgery | Admitting: Vascular Surgery

## 2022-10-18 ENCOUNTER — Other Ambulatory Visit (INDEPENDENT_AMBULATORY_CARE_PROVIDER_SITE_OTHER): Payer: Self-pay | Admitting: Nurse Practitioner

## 2022-10-18 DIAGNOSIS — N186 End stage renal disease: Secondary | ICD-10-CM

## 2022-10-18 DIAGNOSIS — Z01812 Encounter for preprocedural laboratory examination: Secondary | ICD-10-CM

## 2022-10-18 DIAGNOSIS — D696 Thrombocytopenia, unspecified: Secondary | ICD-10-CM

## 2022-10-18 DIAGNOSIS — Z01818 Encounter for other preprocedural examination: Secondary | ICD-10-CM

## 2022-10-18 HISTORY — DX: Thrombocytopenia, unspecified: D69.6

## 2022-10-18 HISTORY — DX: Secondary hyperparathyroidism of renal origin: N25.81

## 2022-10-18 HISTORY — DX: Other specified disorders of kidney and ureter: N28.89

## 2022-10-18 HISTORY — DX: Hypokalemia: E87.6

## 2022-10-18 HISTORY — DX: End stage renal disease: N18.6

## 2022-10-18 NOTE — Patient Instructions (Addendum)
Your procedure is scheduled on:10-26-22 Thursday Report to the Registration Desk on the 1st floor of the [facility].Then proceed to the 2nd floor Surgery Desk To find out your arrival time, please call [phone] between 1PM - 3PM on:10-25-22 Wednesday If your arrival time is 6:00 am, do not arrive before that time as the [facility] entrance doors do not open until 6:00 am.  REMEMBER: Instructions that are not followed completely may result in serious medical risk, up to and including death; or upon the discretion of your surgeon and anesthesiologist your surgery may need to be rescheduled.  Do not eat food OR drink any liquids after midnight the night before surgery.  No gum chewing or hard candies.  One week prior to surgery: Stop Anti-inflammatories (NSAIDS) such as Advil, Aleve, Ibuprofen, Motrin, Naproxen, Naprosyn and Aspirin based products such as Excedrin, Goody's Powder, BC Powder.You may however, continue to take Tylenol if needed for pain up until the day of surgery.  Stop ANY OVER THE COUNTER supplements/vitamins NOW (10-18-22) until after surgery.  TAKE ONLY THESE MEDICATIONS THE MORNING OF SURGERY WITH A SIP OF WATER: -amLODipine (NORVASC)  -bicalutamide (CASODEX)  -metoprolol (LOPRESSOR)  -sodium bicarbonate  -ULORIC   Continue your 81 mg Aspirin up until the day prior to surgery-Do NOT take the day of surgery  No Alcohol for 24 hours before or after surgery.  No Smoking including e-cigarettes for 24 hours before surgery.  No chewable tobacco products for at least 6 hours before surgery.  No nicotine patches on the day of surgery.  Do not use any "recreational" drugs for at least a week (preferably 2 weeks) before your surgery.  Please be advised that the combination of cocaine and anesthesia may have negative outcomes, up to and including death. If you test positive for cocaine, your surgery will be cancelled.  On the morning of surgery brush your teeth with  toothpaste and water, you may rinse your mouth with mouthwash if you wish. Do not swallow any toothpaste or mouthwash.  Use CHG Soap as directed on instruction sheet.  Do not wear jewelry, make-up, hairpins, clips or nail polish.  Do not wear lotions, powders, or perfumes.   Do not shave body hair from the neck down 48 hours before surgery.  Contact lenses, hearing aids and dentures may not be worn into surgery.  Do not bring valuables to the hospital. [facility] is not responsible for any missing/lost belongings or valuables.   Notify your doctor if there is any change in your medical condition (cold, fever, infection).  Wear comfortable clothing (specific to your surgery type) to the hospital.  After surgery, you can help prevent lung complications by doing breathing exercises.  Take deep breaths and cough every 1-2 hours. Your doctor may order a device called an Incentive Spirometer to help you take deep breaths. When coughing or sneezing, hold a pillow firmly against your incision with both hands. This is called "splinting." Doing this helps protect your incision. It also decreases belly discomfort.  If you are being admitted to the hospital overnight, leave your suitcase in the car. After surgery it may be brought to your room.  In case of increased patient census, it may be necessary for you, the patient, to continue your postoperative care in the Same Day Surgery department.  If you are being discharged the day of surgery, you will not be allowed to drive home. You will need a responsible individual to drive you home and stay  with you for 24 hours after surgery.   If you are taking public transportation, you will need to have a responsible individual with you.  Please call the [facility] Dept. at [phone] if you have any questions about these instructions.  Surgery Visitation Policy:  Patients undergoing a surgery or procedure may have two family members  or support persons with them as long as the person is not COVID-19 positive or experiencing its symptoms.   Due to an increase in RSV and influenza rates and associated hospitalizations, children ages 37 and under will not be able to visit patients in [facility]. Masks continue to be strongly recommended.     Preparing for Surgery with CHLORHEXIDINE GLUCONATE (CHG) Soap  Chlorhexidine Gluconate (CHG) Soap  o An antiseptic cleaner that kills germs and bonds with the skin to continue killing germs even after washing  o Used for showering the night before surgery and morning of surgery  Before surgery, you can play an important role by reducing the number of germs on your skin.  CHG (Chlorhexidine gluconate) soap is an antiseptic cleanser which kills germs and bonds with the skin to continue killing germs even after washing.  Please do not use if you have an allergy to CHG or antibacterial soaps. If your skin becomes reddened/irritated stop using the CHG.  1. Shower the NIGHT BEFORE SURGERY and the MORNING OF SURGERY with CHG soap.  2. If you choose to wash your hair, wash your hair first as usual with your normal shampoo.  3. After shampooing, rinse your hair and body thoroughly to remove the shampoo.  4. Use CHG as you would any other liquid soap. You can apply CHG directly to the skin and wash gently with a scrungie or a clean washcloth.  5. Apply the CHG soap to your body only from the neck down. Do not use on open wounds or open sores. Avoid contact with your eyes, ears, mouth, and genitals (private parts). Wash face and genitals (private parts) with your normal soap.  6. Wash thoroughly, paying special attention to the area where your surgery will be performed.  7. Thoroughly rinse your body with warm water.  8. Do not shower/wash with your normal soap after using and rinsing off the CHG soap.  9. Pat yourself dry with a clean towel.  10. Wear clean pajamas to bed  the night before surgery.  12. Place clean sheets on your bed the night of your first shower and do not sleep with pets.  13. Shower again with the CHG soap on the day of surgery prior to arriving at the hospital.  14. Do not apply any deodorants/lotions/powders.  15. Please wear clean clothes to the hospital.

## 2022-10-24 ENCOUNTER — Encounter
Admission: RE | Admit: 2022-10-24 | Discharge: 2022-10-24 | Disposition: A | Discharge status: Home / Self Care | Payer: Medicare Other | Source: Ambulatory Visit | Attending: Vascular Surgery | Admitting: Vascular Surgery

## 2022-10-24 DIAGNOSIS — Z992 Dependence on renal dialysis: Secondary | ICD-10-CM | POA: Diagnosis not present

## 2022-10-24 DIAGNOSIS — D696 Thrombocytopenia, unspecified: Secondary | ICD-10-CM | POA: Diagnosis not present

## 2022-10-24 DIAGNOSIS — N186 End stage renal disease: Secondary | ICD-10-CM | POA: Insufficient documentation

## 2022-10-24 DIAGNOSIS — Z01818 Encounter for other preprocedural examination: Secondary | ICD-10-CM | POA: Insufficient documentation

## 2022-10-24 DIAGNOSIS — Z01812 Encounter for preprocedural laboratory examination: Secondary | ICD-10-CM

## 2022-10-24 LAB — PROTIME-INR
INR: 1.2 (ref 0.8–1.2)
Prothrombin Time: 14.8 seconds (ref 11.4–15.2)

## 2022-10-24 LAB — TYPE AND SCREEN
ABO/RH(D): O POS
Antibody Screen: NEGATIVE

## 2022-10-24 LAB — APTT: aPTT: 32 seconds (ref 24–36)

## 2022-10-26 ENCOUNTER — Ambulatory Visit: Payer: Medicare Other | Admitting: Urgent Care

## 2022-10-26 ENCOUNTER — Ambulatory Visit: Payer: Medicare Other | Admitting: Certified Registered"

## 2022-10-26 ENCOUNTER — Ambulatory Visit
Admission: RE | Admit: 2022-10-26 | Discharge: 2022-10-26 | Disposition: A | Discharge status: Home / Self Care | Payer: Medicare Other | Source: Ambulatory Visit | Attending: Vascular Surgery | Admitting: Vascular Surgery

## 2022-10-26 ENCOUNTER — Other Ambulatory Visit: Payer: Self-pay

## 2022-10-26 ENCOUNTER — Encounter
Admission: RE | Disposition: A | Discharge status: Home / Self Care | Payer: Self-pay | Source: Ambulatory Visit | Attending: Vascular Surgery

## 2022-10-26 ENCOUNTER — Encounter: Payer: Self-pay | Admitting: Vascular Surgery

## 2022-10-26 DIAGNOSIS — N2581 Secondary hyperparathyroidism of renal origin: Secondary | ICD-10-CM | POA: Insufficient documentation

## 2022-10-26 DIAGNOSIS — G4730 Sleep apnea, unspecified: Secondary | ICD-10-CM | POA: Diagnosis not present

## 2022-10-26 DIAGNOSIS — K219 Gastro-esophageal reflux disease without esophagitis: Secondary | ICD-10-CM | POA: Diagnosis not present

## 2022-10-26 DIAGNOSIS — E782 Mixed hyperlipidemia: Secondary | ICD-10-CM | POA: Insufficient documentation

## 2022-10-26 DIAGNOSIS — Z992 Dependence on renal dialysis: Secondary | ICD-10-CM | POA: Insufficient documentation

## 2022-10-26 DIAGNOSIS — E1122 Type 2 diabetes mellitus with diabetic chronic kidney disease: Secondary | ICD-10-CM | POA: Diagnosis not present

## 2022-10-26 DIAGNOSIS — Z01818 Encounter for other preprocedural examination: Secondary | ICD-10-CM

## 2022-10-26 DIAGNOSIS — N186 End stage renal disease: Secondary | ICD-10-CM | POA: Diagnosis present

## 2022-10-26 DIAGNOSIS — I120 Hypertensive chronic kidney disease with stage 5 chronic kidney disease or end stage renal disease: Secondary | ICD-10-CM | POA: Diagnosis not present

## 2022-10-26 HISTORY — PX: AV FISTULA PLACEMENT: SHX1204

## 2022-10-26 LAB — POCT I-STAT, CHEM 8
BUN: 36 mg/dL — ABNORMAL HIGH (ref 8–23)
Calcium, Ion: 0.97 mmol/L — ABNORMAL LOW (ref 1.15–1.40)
Chloride: 105 mmol/L (ref 98–111)
Creatinine, Ser: 7.9 mg/dL — ABNORMAL HIGH (ref 0.61–1.24)
Glucose, Bld: 94 mg/dL (ref 70–99)
HCT: 37 % — ABNORMAL LOW (ref 39.0–52.0)
Hemoglobin: 12.6 g/dL — ABNORMAL LOW (ref 13.0–17.0)
Potassium: 3.8 mmol/L (ref 3.5–5.1)
Sodium: 140 mmol/L (ref 135–145)
TCO2: 24 mmol/L (ref 22–32)

## 2022-10-26 LAB — GLUCOSE, CAPILLARY: Glucose-Capillary: 122 mg/dL — ABNORMAL HIGH (ref 70–99)

## 2022-10-26 SURGERY — ARTERIOVENOUS (AV) FISTULA CREATION
Anesthesia: General | Site: Arm Upper | Laterality: Right

## 2022-10-26 MED ORDER — CEFAZOLIN SODIUM-DEXTROSE 2-4 GM/100ML-% IV SOLN
INTRAVENOUS | Status: AC
  Filled 2022-10-26: qty 100

## 2022-10-26 MED ORDER — GLYCOPYRROLATE 0.2 MG/ML IJ SOLN
INTRAMUSCULAR | Status: DC | PRN
  Administered 2022-10-26: .2 mg via INTRAVENOUS

## 2022-10-26 MED ORDER — DEXAMETHASONE SODIUM PHOSPHATE 10 MG/ML IJ SOLN
INTRAMUSCULAR | Status: AC
  Filled 2022-10-26: qty 1

## 2022-10-26 MED ORDER — KETAMINE HCL 50 MG/ML IJ SOLN
INTRAMUSCULAR | Status: DC | PRN
  Administered 2022-10-26: 30 mg via INTRAMUSCULAR
  Administered 2022-10-26: 20 mg via INTRAMUSCULAR

## 2022-10-26 MED ORDER — FAMOTIDINE 20 MG PO TABS
ORAL_TABLET | ORAL | Status: AC
  Administered 2022-10-26: 20 mg via ORAL
  Filled 2022-10-26: qty 1

## 2022-10-26 MED ORDER — BUPIVACAINE HCL (PF) 0.5 % IJ SOLN
INTRAMUSCULAR | Status: AC
  Filled 2022-10-26: qty 30

## 2022-10-26 MED ORDER — CALCIUM CHLORIDE 10 % IV SOLN
INTRAVENOUS | Status: DC | PRN
  Administered 2022-10-26: 1 g via INTRAVENOUS

## 2022-10-26 MED ORDER — CHLORHEXIDINE GLUCONATE CLOTH 2 % EX PADS: 6.0000 | MEDICATED_PAD | Freq: Once | CUTANEOUS | Status: DC

## 2022-10-26 MED ORDER — OXYCODONE HCL 5 MG PO TABS: 5.0000 mg | ORAL_TABLET | Freq: Once | ORAL | Status: AC | PRN

## 2022-10-26 MED ORDER — PROPOFOL 10 MG/ML IV BOLUS
INTRAVENOUS | Status: AC
  Filled 2022-10-26: qty 20

## 2022-10-26 MED ORDER — FENTANYL CITRATE (PF) 100 MCG/2ML IJ SOLN
INTRAMUSCULAR | Status: DC | PRN
  Administered 2022-10-26: 100 ug via INTRAVENOUS

## 2022-10-26 MED ORDER — SODIUM CHLORIDE 0.9 % IV SOLN: INTRAVENOUS | Status: DC

## 2022-10-26 MED ORDER — LIDOCAINE HCL (PF) 2 % IJ SOLN
INTRAMUSCULAR | Status: AC
  Filled 2022-10-26: qty 5

## 2022-10-26 MED ORDER — ACETAMINOPHEN 10 MG/ML IV SOLN
INTRAVENOUS | Status: AC
  Filled 2022-10-26: qty 100

## 2022-10-26 MED ORDER — OXYCODONE HCL 5 MG/5ML PO SOLN: 5.0000 mg | Freq: Once | ORAL | Status: AC | PRN

## 2022-10-26 MED ORDER — ONDANSETRON HCL 4 MG/2ML IJ SOLN
INTRAMUSCULAR | Status: DC | PRN
  Administered 2022-10-26: 4 mg via INTRAVENOUS

## 2022-10-26 MED ORDER — PROPOFOL 10 MG/ML IV BOLUS
INTRAVENOUS | Status: DC | PRN
  Administered 2022-10-26: 100 mg via INTRAVENOUS
  Administered 2022-10-26 (×4): 50 mg via INTRAVENOUS

## 2022-10-26 MED ORDER — EPHEDRINE 5 MG/ML INJ
INTRAVENOUS | Status: AC
  Filled 2022-10-26: qty 5

## 2022-10-26 MED ORDER — OXYCODONE HCL 5 MG PO TABS
ORAL_TABLET | ORAL | Status: AC
  Administered 2022-10-26: 5 mg via ORAL
  Filled 2022-10-26: qty 1

## 2022-10-26 MED ORDER — FENTANYL CITRATE (PF) 100 MCG/2ML IJ SOLN
INTRAMUSCULAR | Status: AC
  Filled 2022-10-26: qty 2

## 2022-10-26 MED ORDER — CEFAZOLIN SODIUM-DEXTROSE 2-4 GM/100ML-% IV SOLN
2.0000 g | INTRAVENOUS | Status: AC
  Administered 2022-10-26: 2 g via INTRAVENOUS

## 2022-10-26 MED ORDER — CHLORHEXIDINE GLUCONATE 0.12 % MT SOLN: 15.0000 mL | Freq: Once | OROMUCOSAL | Status: AC

## 2022-10-26 MED ORDER — EPHEDRINE SULFATE (PRESSORS) 50 MG/ML IJ SOLN
INTRAMUSCULAR | Status: DC | PRN
  Administered 2022-10-26 (×2): 5 mg via INTRAVENOUS

## 2022-10-26 MED ORDER — FENTANYL CITRATE (PF) 100 MCG/2ML IJ SOLN: 25.0000 ug | INTRAMUSCULAR | Status: DC | PRN

## 2022-10-26 MED ORDER — SUCCINYLCHOLINE CHLORIDE 200 MG/10ML IV SOSY
PREFILLED_SYRINGE | INTRAVENOUS | Status: DC | PRN
  Administered 2022-10-26: 80 mg via INTRAVENOUS

## 2022-10-26 MED ORDER — CALCIUM CHLORIDE 10 % IV SOLN
INTRAVENOUS | Status: AC
  Filled 2022-10-26: qty 10

## 2022-10-26 MED ORDER — LIDOCAINE HCL (PF) 2 % IJ SOLN
INTRAMUSCULAR | Status: DC | PRN
  Administered 2022-10-26: 50 mg

## 2022-10-26 MED ORDER — KETAMINE HCL 50 MG/5ML IJ SOSY
PREFILLED_SYRINGE | INTRAMUSCULAR | Status: AC
  Filled 2022-10-26: qty 5

## 2022-10-26 MED ORDER — DEXAMETHASONE SODIUM PHOSPHATE 10 MG/ML IJ SOLN
INTRAMUSCULAR | Status: DC | PRN
  Administered 2022-10-26: 10 mg via INTRAVENOUS

## 2022-10-26 MED ORDER — HYDROCODONE-ACETAMINOPHEN 5-325 MG PO TABS: 2.0000 | ORAL_TABLET | Freq: Four times a day (QID) | ORAL | 0 refills | Status: AC | PRN

## 2022-10-26 MED ORDER — MICROFIBRILLAR COLL HEMOSTAT EX PADS
MEDICATED_PAD | CUTANEOUS | Status: DC | PRN
  Administered 2022-10-26: 1 via TOPICAL

## 2022-10-26 MED ORDER — HEPARIN SODIUM (PORCINE) 5000 UNIT/ML IJ SOLN
INTRAMUSCULAR | Status: AC
  Filled 2022-10-26: qty 1

## 2022-10-26 MED ORDER — SEVOFLURANE IN SOLN
RESPIRATORY_TRACT | Status: AC
  Filled 2022-10-26: qty 250

## 2022-10-26 MED ORDER — ORAL CARE MOUTH RINSE: 15.0000 mL | Freq: Once | OROMUCOSAL | Status: AC

## 2022-10-26 MED ORDER — SODIUM CHLORIDE 0.9 % IR SOLN
Status: DC | PRN
  Administered 2022-10-26: 500 mL

## 2022-10-26 MED ORDER — EPINEPHRINE PF 1 MG/ML IJ SOLN
INTRAMUSCULAR | Status: AC
  Filled 2022-10-26: qty 1

## 2022-10-26 MED ORDER — ONDANSETRON HCL 4 MG/2ML IJ SOLN: 4.0000 mg | Freq: Four times a day (QID) | INTRAMUSCULAR | Status: DC | PRN

## 2022-10-26 MED ORDER — ACETAMINOPHEN 10 MG/ML IV SOLN
INTRAVENOUS | Status: DC | PRN
  Administered 2022-10-26: 1000 mg via INTRAVENOUS

## 2022-10-26 MED ORDER — FAMOTIDINE 20 MG PO TABS: 20.0000 mg | ORAL_TABLET | Freq: Once | ORAL | Status: AC

## 2022-10-26 MED ORDER — CHLORHEXIDINE GLUCONATE 0.12 % MT SOLN
OROMUCOSAL | Status: AC
  Administered 2022-10-26: 15 mL via OROMUCOSAL
  Filled 2022-10-26: qty 15

## 2022-10-26 MED ORDER — GLYCOPYRROLATE 0.2 MG/ML IJ SOLN
INTRAMUSCULAR | Status: AC
  Filled 2022-10-26: qty 1

## 2022-10-26 MED ORDER — HEPARIN SODIUM (PORCINE) 1000 UNIT/ML IJ SOLN
INTRAMUSCULAR | Status: DC | PRN
  Administered 2022-10-26: 3000 [IU] via INTRAVENOUS

## 2022-10-26 MED ORDER — HYDROMORPHONE HCL 1 MG/ML IJ SOLN: 1.0000 mg | Freq: Once | INTRAMUSCULAR | Status: DC | PRN

## 2022-10-26 SURGICAL SUPPLY — 45 items
ADH SKN CLS APL DERMABOND .7 (GAUZE/BANDAGES/DRESSINGS) ×1
APL PRP STRL LF DISP 70% ISPRP (MISCELLANEOUS) ×2
BAG DECANTER FOR FLEXI CONT (MISCELLANEOUS) ×1 IMPLANT
BLADE SURG SZ11 CARB STEEL (BLADE) ×1 IMPLANT
BOOT SUTURE VASCULAR YLW (MISCELLANEOUS) ×1
BRUSH SCRUB EZ  4% CHG (MISCELLANEOUS) ×1
BRUSH SCRUB EZ 4% CHG (MISCELLANEOUS) ×1 IMPLANT
CHLORAPREP W/TINT 26 (MISCELLANEOUS) ×1 IMPLANT
CLAMP SUTURE YELLOW 5 PAIRS (MISCELLANEOUS) ×1 IMPLANT
CLIP SPRNG 6 S-JAW DBL (CLIP) ×1 IMPLANT
CLIP SPRNG 6MM S-JAW DBL (CLIP) ×1
DERMABOND ADVANCED .7 DNX12 (GAUZE/BANDAGES/DRESSINGS) ×1 IMPLANT
ELECT CAUTERY BLADE 6.4 (BLADE) ×1 IMPLANT
ELECT REM PT RETURN 9FT ADLT (ELECTROSURGICAL) ×1
ELECTRODE REM PT RTRN 9FT ADLT (ELECTROSURGICAL) ×1 IMPLANT
GLOVE BIO SURGEON STRL SZ7 (GLOVE) ×2 IMPLANT
GOWN STRL REUS W/ TWL LRG LVL3 (GOWN DISPOSABLE) ×2 IMPLANT
GOWN STRL REUS W/ TWL XL LVL3 (GOWN DISPOSABLE) ×2 IMPLANT
GOWN STRL REUS W/TWL LRG LVL3 (GOWN DISPOSABLE) ×2
GOWN STRL REUS W/TWL XL LVL3 (GOWN DISPOSABLE) ×2
HEMOSTAT SURGICEL 2X3 (HEMOSTASIS) ×1 IMPLANT
IV NS 500ML (IV SOLUTION) ×1
IV NS 500ML BAXH (IV SOLUTION) ×1 IMPLANT
KIT TURNOVER KIT A (KITS) ×1 IMPLANT
LABEL OR SOLS (LABEL) ×1 IMPLANT
LOOP VESSEL MAXI  1X406 RED (MISCELLANEOUS) ×1
LOOP VESSEL MAXI 1X406 RED (MISCELLANEOUS) ×1 IMPLANT
LOOP VESSEL MINI 0.8X406 BLUE (MISCELLANEOUS) ×1 IMPLANT
MANIFOLD NEPTUNE II (INSTRUMENTS) ×1 IMPLANT
NDL FILTER BLUNT 18X1 1/2 (NEEDLE) ×1 IMPLANT
NEEDLE FILTER BLUNT 18X1 1/2 (NEEDLE) ×1 IMPLANT
PACK EXTREMITY ARMC (MISCELLANEOUS) ×1 IMPLANT
PAD PREP 24X41 OB/GYN DISP (PERSONAL CARE ITEMS) ×1 IMPLANT
SOLUTION CELL SAVER (CLIP) ×1 IMPLANT
STOCKINETTE 48X4 2 PLY STRL (GAUZE/BANDAGES/DRESSINGS) ×1 IMPLANT
STOCKINETTE STRL 4IN 9604848 (GAUZE/BANDAGES/DRESSINGS) ×1 IMPLANT
SUT MNCRL AB 4-0 PS2 18 (SUTURE) ×1 IMPLANT
SUT PROLENE 6 0 BV (SUTURE) ×4 IMPLANT
SUT VIC AB 3-0 SH 27 (SUTURE) ×1
SUT VIC AB 3-0 SH 27X BRD (SUTURE) ×1 IMPLANT
SYR 20ML LL LF (SYRINGE) ×1 IMPLANT
SYR 3ML LL SCALE MARK (SYRINGE) ×1 IMPLANT
SYR TB 1ML 27GX1/2 LL (SYRINGE) IMPLANT
TAG SUTURE CLAMP YLW 5PR (MISCELLANEOUS) ×1
TRAP FLUID SMOKE EVACUATOR (MISCELLANEOUS) ×1 IMPLANT

## 2022-10-26 NOTE — Op Note (Signed)
[facility]   OPERATIVE NOTE   PROCEDURE: Right brachiocephalic arteriovenous fistula placement  PRE-OPERATIVE DIAGNOSIS: 1.  ESRD        POST-OPERATIVE DIAGNOSIS: 1. ESRD       SURGEON: [name], MD  ASSISTANT(S): none  ANESTHESIA: general  ESTIMATED BLOOD LOSS: 20 cc  FINDING(S): Adequate cephalic vein for fistula creation  SPECIMEN(S):  none  INDICATIONS:   [name] [name] is a 82 y.o. male who presents with renal failure in need of pemanent dialysis acces.  The patient is scheduled for right arm AVF placement.  The patient is aware the risks include but are not limited to: bleeding, infection, steal syndrome, nerve damage, ischemic monomelic neuropathy, failure to mature, and need for additional procedures.  The patient is aware of the risks of the procedure and elects to proceed forward.  DESCRIPTION: After full informed written consent was obtained from the patient, the patient was brought back to the operating room and placed supine upon the operating table.  Prior to induction, the patient received IV antibiotics.   After obtaining adequate anesthesia, the patient was then prepped and draped in the standard fashion for a right arm access procedure.  I made a curvilinear incision at the level of the antecubital fossa and dissected through the subcutaneous tissue and fascia to gain exposure of the brachial artery.  This was noted to be patent and adequate in size for fistula creation.  This was dissected out proximally and distally and prepared for control with vessel loops .  I then dissected out the cephalic vein.  This was noted to be patent and adequate in size for fistula creation.  I then gave the patient 3000 units of intravenous heparin.  The vein was marked for orientation and the distal segment of the vein was ligated with a  2-0 silk, and the vein was transected.  I then instilled the heparinized saline into the vein and clamped it.  At this  point, I reset my exposure of the brachial artery and pulled up control on the vessel loops.  I made an arteriotomy with a #11 blade, and then I extended the arteriotomy with a Potts scissor.  I injected heparinized saline proximal and distal to this arteriotomy.  The vein was then sewn to the artery in an end-to-side configuration with a running stitch of 6-0 Prolene.  Prior to completing this anastomosis, I allowed the vein and artery to backbleed.  There was no evidence of clot from any vessels.  I completed the anastomosis in the usual fashion and then released all vessel loops and clamps.  There was a palpable  thrill in the venous outflow, and there was a palpable pulse in the artery distal to the anastomosis.  At this point, I irrigated out the surgical wound.  Surgicel was placed. There was no further active bleeding.  The subcutaneous tissue was reapproximated with a running stitch of 3-0 Vicryl.  The skin was then closed with a 4-0 Monocryl suture.  The skin was then cleaned, dried, and reinforced with Dermabond.  The patient tolerated this procedure well and was taken to the recovery room in stable condition  COMPLICATIONS: None  CONDITION: Stable   [name]    10/26/2022, 9:01 AM  This note was created with Dragon Medical transcription system. Any errors in dictation are purely unintentional.

## 2022-10-26 NOTE — Transfer of Care (Signed)
Immediate Anesthesia Transfer of Care Note  Patient: [name] [name]  Procedure(s) Performed: ARTERIOVENOUS (AV) FISTULA CREATION (Right: Arm Upper)  Patient Location: PACU  Anesthesia Type:General  Level of Consciousness: awake  Airway & Oxygen Therapy: Patient Spontanous Breathing and Patient connected to face mask oxygen  Post-op Assessment: Report given to RN and Post -op Vital signs reviewed and stable  Post vital signs: Reviewed  Last Vitals:  Vitals Value Taken Time  BP 118/56 10/26/22 0911  Temp 36.1 C 10/26/22 0911  Pulse 59 10/26/22 0911  Resp 15 10/26/22 0911  SpO2 100 % 10/26/22 0911  Vitals shown include unvalidated device data.  Last Pain:         Complications: No notable events documented.

## 2022-10-26 NOTE — Anesthesia Procedure Notes (Signed)
Procedure Name: LMA Insertion Date/Time: 10/26/2022 7:52 AM  Performed by: Rolla Plate, CRNAPre-anesthesia Checklist: Patient identified, Patient being monitored, Timeout performed, Emergency Drugs available and Suction available Patient Re-evaluated:Patient Re-evaluated prior to induction Oxygen Delivery Method: Circle system utilized Preoxygenation: Pre-oxygenation with 100% oxygen Induction Type: IV induction Ventilation: Mask ventilation without difficulty LMA: LMA inserted LMA Size: 5.0 Tube type: Oral Number of attempts: 1 Placement Confirmation: positive ETCO2 and breath sounds checked- equal and bilateral Tube secured with: Tape Dental Injury: Teeth and Oropharynx as per pre-operative assessment

## 2022-10-26 NOTE — H&P (Signed)
[name] Admission History & Physical  MRN : [id number]  [name] [name] is a 82 y.o. (03/30/1941) male who presents with chief complaint of No chief complaint on file. Marland Kitchen  History of Present Illness: patient presents for right arm access placement.  No new complaints since office visit. No fever or chills.    Current Facility-Administered Medications  Medication Dose Route Frequency Provider Last Rate Last Admin   0.9 %  sodium chloride infusion   Intravenous Continuous Ilene Qua, MD 50 mL/hr at 10/26/22 0623 New Bag at 10/26/22 [id number]   ceFAZolin (ANCEF) 2-4 GM/100ML-% IVPB            ceFAZolin (ANCEF) IVPB 2g/100 mL premix  2 g Intravenous On Call to OR Kris Hartmann, NP       Chlorhexidine Gluconate Cloth 2 % PADS 6 each  6 each Topical Once Kris Hartmann, NP       And   Chlorhexidine Gluconate Cloth 2 % PADS 6 each  6 each Topical Once Kris Hartmann, NP       HYDROmorphone (DILAUDID) injection 1 mg  1 mg Intravenous Once PRN Kris Hartmann, NP       ondansetron [employer]) injection 4 mg  4 mg Intravenous Q6H PRN Kris Hartmann, NP        Past Medical History:  Diagnosis Date   Anemia    Arthritis    Cancer ([place])    Chronic kidney disease    Diabetes mellitus without complication ([place])    ESRD (end stage renal disease) ([place])    GERD (gastroesophageal reflux disease)    h/o   Gout    Hyperlipidemia, mixed    Hypertension    Hypokalemia    Renal mass    Secondary hyperparathyroidism of renal origin ([place])    Sleep apnea    h/o no cpap   Thrombocytopenia ([place])     Past Surgical History:  Procedure Laterality Date   APPENDECTOMY     Bunion Removal Right    CAPD INSERTION N/A 11/23/2021   Procedure: LAPAROSCOPIC INSERTION CONTINUOUS AMBULATORY PERITONEAL DIALYSIS  (CAPD) CATHETER;  Surgeon: Olean Ree, MD;  Location: [facility];  Service: General;  Laterality: N/A;   CAPD REVISION N/A 01/31/2022   Procedure: LAPAROSCOPIC REVISION  CONTINUOUS AMBULATORY PERITONEAL DIALYSIS  (CAPD) CATHETER;  Surgeon: Olean Ree, MD;  Location: [facility];  Service: General;  Laterality: N/A;   CAPD REVISION N/A 04/06/2022   Procedure: LAPAROSCOPIC REVISION CONTINUOUS AMBULATORY PERITONEAL DIALYSIS  (CAPD) CATHETER, replacement;  Surgeon: Olean Ree, MD;  Location: [facility];  Service: General;  Laterality: N/A;   CAPD REVISION N/A 04/14/2022   Procedure: LAPAROSCOPIC ASSISTED CONTINUOUS AMBULATORY PERITONEAL DIALYSIS  (CAPD) CATHETER REMOVAL;  Surgeon: Olean Ree, MD;  Location: [facility];  Service: General;  Laterality: N/A;   COLONOSCOPY WITH PROPOFOL N/A 06/14/2015   Procedure: COLONOSCOPY WITH PROPOFOL;  Surgeon: Lollie Sails, MD;  Location: [facility] ENDOSCOPY;  Service: Endoscopy;  Laterality: N/A;   DIALYSIS/PERMA CATHETER INSERTION N/A 10/03/2021   Procedure: DIALYSIS/PERMA CATHETER INSERTION;  Surgeon: Algernon Huxley, MD;  Location: [place] [facility];  Service: Cardiovascular;  Laterality: N/A;   EYE SURGERY     HERNIA REPAIR     abd   INSERTION OF MESH  11/23/2021   Procedure: INSERTION OF MESH;  Surgeon: Olean Ree, MD;  Location: [facility];  Service: General;;   PROSTATE BIOPSY N/A 03/27/2017   Procedure: PROSTATE BIOPSY-URO NAV;  Surgeon: Royston Cowper, MD;  Location: [facility];  Service: Urology;  Laterality: N/A;   PROSTATE BIOPSY N/A 09/11/2019   Procedure: PROSTATE BIOPSY [facility] Forster;  Surgeon: Royston Cowper, MD;  Location: [facility];  Service: Urology;  Laterality: N/A;   RETINAL DETACHMENT SURGERY Left    TRANSURETHRAL RESECTION OF PROSTATE     XI ROBOTIC ASSISTED VENTRAL HERNIA N/A 11/23/2021   Procedure: XI ROBOTIC ASSISTED VENTRAL HERNIA, incisional;  Surgeon: Olean Ree, MD;  Location: [facility];  Service: General;  Laterality: N/A;     Social History   Tobacco Use   Smoking status: Never   Smokeless tobacco: Never  Vaping Use   Vaping Use: Never used  Substance Use Topics   Alcohol use: Never    Drug use: No     Family History  Problem Relation Age of Onset   Leukemia Father    Leukemia Brother   No bleeding or clotting disorders  Allergies  Allergen Reactions   Fish Allergy Swelling   Ciprofloxacin     Unknown reaction   Doxazosin Other (See Comments)    unknown      REVIEW OF SYSTEMS (Negative unless checked)   Constitutional: '[]'$ Weight loss  '[]'$ Fever  '[]'$ Chills Cardiac: '[]'$ Chest pain   '[]'$ Chest pressure   '[]'$ Palpitations   '[]'$ Shortness of breath when laying flat   '[]'$ Shortness of breath at rest   '[]'$ Shortness of breath with exertion. Vascular:  '[]'$ Pain in legs with walking   '[]'$ Pain in legs at rest   '[]'$ Pain in legs when laying flat   '[]'$ Claudication   '[]'$ Pain in feet when walking  '[]'$ Pain in feet at rest  '[]'$ Pain in feet when laying flat   '[]'$ History of DVT   '[]'$ Phlebitis   '[]'$ Swelling in legs   '[]'$ Varicose veins   '[]'$ Non-healing ulcers Pulmonary:   '[]'$ Uses home oxygen   '[]'$ Productive cough   '[]'$ Hemoptysis   '[]'$ Wheeze  '[]'$ COPD   '[]'$ Asthma Neurologic:  '[]'$ Dizziness  '[]'$ Blackouts   '[]'$ Seizures   '[]'$ History of stroke   '[]'$ History of TIA  '[]'$ Aphasia   '[]'$ Temporary blindness   '[]'$ Dysphagia   '[]'$ Weakness or numbness in arms   '[]'$ Weakness or numbness in legs Musculoskeletal:  '[x]'$ Arthritis   '[]'$ Joint swelling   '[]'$ Joint pain   '[]'$ Low back pain Hematologic:  '[]'$ Easy bruising  '[]'$ Easy bleeding   '[]'$ Hypercoagulable state   '[x]'$ Anemic   Gastrointestinal:  '[]'$ Blood in stool   '[]'$ Vomiting blood  '[x]'$ Gastroesophageal reflux/heartburn   '[]'$ Abdominal pain Genitourinary:  '[x]'$ Chronic kidney disease   '[]'$ Difficult urination  '[]'$ Frequent urination  '[]'$ Burning with urination   '[]'$ Hematuria Skin:  '[]'$ Rashes   '[]'$ Ulcers   '[]'$ Wounds Psychological:  '[]'$ History of anxiety   '[]'$  History of major depression.     Physical Examination  Vitals:   10/26/22 0634  BP: 127/61  Pulse: (!) 52  Resp: 18  Temp: 97.7 F (36.5 C)  TempSrc: Oral  SpO2: 100%  Weight: 79.3 kg  Height: '5\' 9"'$  (1.753 m)   Body mass index is 25.82 kg/m. Gen: WD/WN,  NAD Head: [place]/AT, No temporalis wasting.  Ear/Nose/Throat: Hearing grossly intact, nares w/o erythema or drainage, oropharynx w/o Erythema/Exudate,  Eyes: Conjunctiva clear, sclera non-icteric Neck: Trachea midline.  No JVD.  Pulmonary:  Good air movement, respirations not labored, no use of accessory muscles.  Cardiac: RRR, normal S1, S2. Vascular:  Vessel Right Left  Radial Palpable Palpable               Musculoskeletal: M/S 5/5 throughout.  Extremities without ischemic changes.  No deformity or atrophy.  Neurologic: Sensation grossly intact in extremities.  Symmetrical.  Speech is fluent. Motor exam as listed above. Psychiatric: Judgment intact, Mood & affect appropriate for pt's clinical situation. Dermatologic: No rashes or ulcers noted.  No cellulitis or open wounds.      CBC Lab Results  Component Value Date   WBC 7.6 04/15/2022   HGB 12.6 (L) 10/26/2022   HCT 37.0 (L) 10/26/2022   MCV 89.5 04/15/2022   PLT 95 (L) 04/15/2022    BMET    Component Value Date/Time   NA 140 10/26/2022 0629   NA 141 10/14/2012 0518   K 3.8 10/26/2022 0629   K 4.0 10/14/2012 0518   CL 105 10/26/2022 0629   CL 111 (H) 10/14/2012 0518   CO2 30 04/15/2022 0754   CO2 20 (L) 10/14/2012 0518   GLUCOSE 94 10/26/2022 0629   GLUCOSE 171 (H) 10/14/2012 0518   BUN 36 (H) 10/26/2022 0629   BUN 44 (H) 10/14/2012 0518   CREATININE 7.90 (H) 10/26/2022 0629   CREATININE 2.54 (H) 10/14/2012 0518   CALCIUM 8.5 (L) 04/15/2022 0754   CALCIUM 9.2 10/14/2012 0518   GFRNONAA 19 (L) 04/15/2022 0754   GFRNONAA 25 (L) 10/14/2012 0518   GFRAA 10 (L) 06/17/2018 1132   GFRAA 29 (L) 10/14/2012 0518   Estimated Creatinine Clearance: 7.5 mL/min (A) (by C-G formula based on SCr of 7.9 mg/dL (H)).  COAG Lab Results  Component Value Date   INR 1.2 10/24/2022   INR 1.1 01/31/2022    Radiology No results found.   Assessment/Plan ESRD (end stage renal disease) on dialysis ([place]) Noninvasive studies  were performed recently.  His arterial flow was multiphasic and without obvious stenosis in either upper extremity.  On the left arm, the cephalic vein appeared too small for fistula creation and the basilic vein appears adequate for fistula creation.  On the right arm, the cephalic vein appeared to be marginal to adequate for fistula creation in the upper arm and the basilic vein was adequate for fistula creation. We had a long discussion today regarding the findings and our best options for dialysis.  I think evaluating him for an AV fistula in the right arm would be prudent.  If the cephalic vein is usable, this would be used preferentially but if it is not, the basilic vein will be used.  We discussed that this would be a two-stage operation if it is the basilic vein.  He voices understanding and is agreeable to proceed.   Type 2 diabetes mellitus with chronic kidney disease (HCC) blood glucose control important in reducing the progression of atherosclerotic disease. Also, involved in wound healing. On appropriate medications.     Benign essential hypertension blood pressure control important in reducing the progression of atherosclerotic disease. On appropriate oral medications.     Mixed hyperlipidemia lipid control important in reducing the progression of atherosclerotic disease. Continue statin therapy   [name], MD  10/26/2022 7:11 AM

## 2022-10-26 NOTE — Anesthesia Postprocedure Evaluation (Signed)
Anesthesia Post Note  Patient: [name] [name]  Procedure(s) Performed: ARTERIOVENOUS (AV) FISTULA CREATION (Right: Arm Upper)  Patient location during evaluation: PACU Anesthesia Type: General Level of consciousness: awake and alert Pain management: pain level controlled Vital Signs Assessment: post-procedure vital signs reviewed and stable Respiratory status: spontaneous breathing, nonlabored ventilation, respiratory function stable and patient connected to nasal cannula oxygen Cardiovascular status: blood pressure returned to baseline and stable Postop Assessment: no apparent nausea or vomiting Anesthetic complications: no   No notable events documented.   Last Vitals:  Vitals:   10/26/22 0955 10/26/22 1009  BP: (!) 150/61 111/84  Pulse: 65 62  Resp: 15 18  Temp: (!) 36.2 C   SpO2: 100% 100%    Last Pain:  Vitals:   10/26/22 1009  TempSrc:   PainSc: 2                  Ilene Qua

## 2022-10-26 NOTE — Discharge Instructions (Signed)

## 2022-10-26 NOTE — Anesthesia Procedure Notes (Signed)
Procedure Name: Intubation Date/Time: 10/26/2022 8:16 AM  Performed by: Rolla Plate, CRNAPre-anesthesia Checklist: Patient identified, Patient being monitored, Timeout performed, Emergency Drugs available and Suction available Patient Re-evaluated:Patient Re-evaluated prior to induction Oxygen Delivery Method: Circle system utilized Preoxygenation: Pre-oxygenation with 100% oxygen Induction Type: IV induction Ventilation: Mask ventilation without difficulty Laryngoscope Size: McGraph and 4 Grade View: Grade I Tube type: Oral Tube size: 7.5 mm Number of attempts: 1 Airway Equipment and Method: Stylet and Video-laryngoscopy Placement Confirmation: ETT inserted through vocal cords under direct vision, positive ETCO2 and breath sounds checked- equal and bilateral Secured at: 23 cm Tube secured with: Tape Dental Injury: Teeth and Oropharynx as per pre-operative assessment

## 2022-10-26 NOTE — Anesthesia Preprocedure Evaluation (Signed)
Anesthesia Evaluation  Patient identified by MRN, date of birth, ID band Patient awake    Reviewed: Allergy & Precautions, NPO status , Patient's Chart, lab work & pertinent test results  History of Anesthesia Complications Negative for: history of anesthetic complications  Airway Mallampati: IV  TM Distance: >3 FB Neck ROM: full    Dental no notable dental hx.    Pulmonary sleep apnea    Pulmonary exam normal        Cardiovascular hypertension, On Medications negative cardio ROS Normal cardiovascular exam     Neuro/Psych negative neurological ROS  negative psych ROS   GI/Hepatic Neg liver ROS,GERD  Medicated,,  Endo/Other  negative endocrine ROSdiabetes    Renal/GU ESRF and DialysisRenal disease     Musculoskeletal   Abdominal   Peds  Hematology  (+) Blood dyscrasia, anemia   Anesthesia Other Findings Past Medical History: No date: Anemia No date: Arthritis No date: Cancer ([place]) No date: Chronic kidney disease No date: Diabetes mellitus without complication (HCC) No date: ESRD (end stage renal disease) (HCC) No date: GERD (gastroesophageal reflux disease)     Comment:  h/o No date: Gout No date: Hyperlipidemia, mixed No date: Hypertension No date: Hypokalemia No date: Renal mass No date: Secondary hyperparathyroidism of renal origin ([place]) No date: Sleep apnea     Comment:  h/o no cpap No date: Thrombocytopenia ([place])  Past Surgical History: No date: APPENDECTOMY No date: Bunion Removal; Right 11/23/2021: CAPD INSERTION; N/A     Comment:  Procedure: LAPAROSCOPIC INSERTION CONTINUOUS AMBULATORY               PERITONEAL DIALYSIS  (CAPD) CATHETER;  Surgeon: Olean Ree, MD;  Location: [facility] ORS;  Service: General;                Laterality: N/A; 01/31/2022: CAPD REVISION; N/A     Comment:  Procedure: [place]  (CAPD)  CATHETER;  Surgeon: Olean Ree, MD;  Location: [facility];  Service: General;                Laterality: N/A; 04/06/2022: CAPD REVISION; N/A     Comment:  Procedure: [place]  (CAPD) CATHETER, replacement;                Surgeon: Olean Ree, MD;  Location: [facility];                Service: General;  Laterality: N/A; 04/14/2022: CAPD REVISION; N/A     Comment:  Procedure: LAPAROSCOPIC ASSISTED CONTINUOUS AMBULATORY               PERITONEAL DIALYSIS  (CAPD) CATHETER REMOVAL;  Surgeon:               Olean Ree, MD;  Location: [facility] OR[facility];  Service:               General;  Laterality: N/A; 06/14/2015: COLONOSCOPY WITH PROPOFOL; N/A     Comment:  Procedure: COLONOSCOPY WITH PROPOFOL;  Surgeon: Lollie Sails, MD;  Location: [facility] ENDOSCOPY;  Service:  Endoscopy;  Laterality: N/A; 10/03/2021: DIALYSIS/PERMA CATHETER INSERTION; N/A     Comment:  Procedure: DIALYSIS/PERMA CATHETER INSERTION;  Surgeon:               Algernon Huxley, MD;  Location: [place] [facility];                Service: Cardiovascular;  Laterality: N/A; No date: EYE SURGERY No date: HERNIA REPAIR     Comment:  abd 11/23/2021: INSERTION OF MESH     Comment:  Procedure: INSERTION OF MESH;  Surgeon: Olean Ree,               MD;  Location: [place] OR[facility];  Service: General;; 03/27/2017: PROSTATE BIOPSY; N/A     Comment:  Procedure: PROSTATE BIOPSY-URO NAV;  Surgeon: Royston Cowper, MD;  Location: [facility] OR[facility];  Service: Urology;                Laterality: N/A; 09/11/2019: PROSTATE BIOPSY; N/A     Comment:  Procedure: PROSTATE BIOPSY [facility] Forster;  Surgeon:               Royston Cowper, MD;  Location: [facility] OR[facility];  Service:               Urology;  Laterality: N/A; No date: RETINAL DETACHMENT SURGERY; Left No date: TRANSURETHRAL RESECTION OF PROSTATE 11/23/2021: XI ROBOTIC ASSISTED VENTRAL HERNIA; N/A      Comment:  Procedure: XI ROBOTIC ASSISTED VENTRAL HERNIA,               incisional;  Surgeon: Olean Ree, MD;  Location: [facility]               ORS;  Service: General;  Laterality: N/A;  BMI    Body Mass Index: 25.82 kg/m      Reproductive/Obstetrics negative OB ROS                             Anesthesia Physical Anesthesia Plan  ASA: 4  Anesthesia Plan: General ETT   Post-op Pain Management: Ofirmev IV (intra-op)*   Induction: Intravenous  PONV Risk Score and Plan: Ondansetron, Dexamethasone, Midazolam and Treatment may vary due to age or medical condition  Airway Management Planned: Oral ETT  Additional Equipment:   Intra-op Plan:   Post-operative Plan: Extubation in OR  Informed Consent: I have reviewed the patients History and Physical, chart, labs and discussed the procedure including the risks, benefits and alternatives for the proposed anesthesia with the patient or authorized representative who has indicated his/her understanding and acceptance.     Dental Advisory Given  Plan Discussed with: Anesthesiologist, CRNA and Surgeon  Anesthesia Plan Comments: (Patient consented for risks of anesthesia including but not limited to:  - adverse reactions to medications - damage to eyes, teeth, lips or other oral mucosa - nerve damage due to positioning  - sore throat or hoarseness - Damage to heart, brain, nerves, lungs, other parts of body or loss of life  Patient voiced understanding.)        Anesthesia Quick Evaluation

## 2022-11-14 ENCOUNTER — Other Ambulatory Visit: Payer: Self-pay | Admitting: Family Medicine

## 2022-11-14 DIAGNOSIS — Z8679 Personal history of other diseases of the circulatory system: Secondary | ICD-10-CM

## 2022-11-14 DIAGNOSIS — N644 Mastodynia: Secondary | ICD-10-CM

## 2022-11-15 ENCOUNTER — Other Ambulatory Visit (INDEPENDENT_AMBULATORY_CARE_PROVIDER_SITE_OTHER): Payer: Self-pay | Admitting: Vascular Surgery

## 2022-11-15 DIAGNOSIS — N186 End stage renal disease: Secondary | ICD-10-CM

## 2022-11-15 DIAGNOSIS — Z9889 Other specified postprocedural states: Secondary | ICD-10-CM

## 2022-11-18 ENCOUNTER — Emergency Department: Payer: Medicare Other

## 2022-11-18 ENCOUNTER — Encounter: Payer: Self-pay | Admitting: Emergency Medicine

## 2022-11-18 ENCOUNTER — Encounter: Payer: Self-pay | Admitting: Oncology

## 2022-11-18 ENCOUNTER — Encounter: Payer: Self-pay | Admitting: Intensive Care

## 2022-11-18 ENCOUNTER — Other Ambulatory Visit: Payer: Self-pay

## 2022-11-18 ENCOUNTER — Inpatient Hospital Stay
Admission: EM | Admit: 2022-11-18 | Discharge: 2022-11-25 | DRG: 252 | Disposition: A | Discharge status: Home Health | Payer: Medicare Other | Attending: Internal Medicine | Admitting: Internal Medicine

## 2022-11-18 ENCOUNTER — Ambulatory Visit
Admission: EM | Admit: 2022-11-18 | Discharge: 2022-11-18 | Disposition: A | Discharge status: Home / Self Care | Payer: Medicare Other | Attending: Physician Assistant | Admitting: Physician Assistant

## 2022-11-18 DIAGNOSIS — G4730 Sleep apnea, unspecified: Secondary | ICD-10-CM | POA: Diagnosis present

## 2022-11-18 DIAGNOSIS — B9562 Methicillin resistant Staphylococcus aureus infection as the cause of diseases classified elsewhere: Secondary | ICD-10-CM | POA: Diagnosis present

## 2022-11-18 DIAGNOSIS — M79601 Pain in right arm: Secondary | ICD-10-CM | POA: Diagnosis not present

## 2022-11-18 DIAGNOSIS — Z992 Dependence on renal dialysis: Secondary | ICD-10-CM | POA: Diagnosis not present

## 2022-11-18 DIAGNOSIS — R451 Restlessness and agitation: Secondary | ICD-10-CM | POA: Diagnosis not present

## 2022-11-18 DIAGNOSIS — I871 Compression of vein: Secondary | ICD-10-CM | POA: Diagnosis present

## 2022-11-18 DIAGNOSIS — I10 Essential (primary) hypertension: Secondary | ICD-10-CM | POA: Diagnosis present

## 2022-11-18 DIAGNOSIS — I82A11 Acute embolism and thrombosis of right axillary vein: Principal | ICD-10-CM

## 2022-11-18 DIAGNOSIS — E782 Mixed hyperlipidemia: Secondary | ICD-10-CM | POA: Diagnosis present

## 2022-11-18 DIAGNOSIS — L7682 Other postprocedural complications of skin and subcutaneous tissue: Secondary | ICD-10-CM

## 2022-11-18 DIAGNOSIS — E1151 Type 2 diabetes mellitus with diabetic peripheral angiopathy without gangrene: Secondary | ICD-10-CM | POA: Diagnosis present

## 2022-11-18 DIAGNOSIS — N186 End stage renal disease: Secondary | ICD-10-CM | POA: Diagnosis present

## 2022-11-18 DIAGNOSIS — Y838 Other surgical procedures as the cause of abnormal reaction of the patient, or of later complication, without mention of misadventure at the time of the procedure: Secondary | ICD-10-CM | POA: Diagnosis present

## 2022-11-18 DIAGNOSIS — I82210 Acute embolism and thrombosis of superior vena cava: Secondary | ICD-10-CM | POA: Diagnosis present

## 2022-11-18 DIAGNOSIS — Z79899 Other long term (current) drug therapy: Secondary | ICD-10-CM

## 2022-11-18 DIAGNOSIS — D693 Immune thrombocytopenic purpura: Secondary | ICD-10-CM | POA: Diagnosis present

## 2022-11-18 DIAGNOSIS — I120 Hypertensive chronic kidney disease with stage 5 chronic kidney disease or end stage renal disease: Secondary | ICD-10-CM | POA: Diagnosis present

## 2022-11-18 DIAGNOSIS — D631 Anemia in chronic kidney disease: Secondary | ICD-10-CM | POA: Diagnosis present

## 2022-11-18 DIAGNOSIS — M109 Gout, unspecified: Secondary | ICD-10-CM | POA: Diagnosis present

## 2022-11-18 DIAGNOSIS — E11649 Type 2 diabetes mellitus with hypoglycemia without coma: Secondary | ICD-10-CM | POA: Diagnosis not present

## 2022-11-18 DIAGNOSIS — E11319 Type 2 diabetes mellitus with unspecified diabetic retinopathy without macular edema: Secondary | ICD-10-CM | POA: Diagnosis present

## 2022-11-18 DIAGNOSIS — N2581 Secondary hyperparathyroidism of renal origin: Secondary | ICD-10-CM | POA: Diagnosis present

## 2022-11-18 DIAGNOSIS — T8249XA Other complication of vascular dialysis catheter, initial encounter: Secondary | ICD-10-CM | POA: Diagnosis not present

## 2022-11-18 DIAGNOSIS — R531 Weakness: Secondary | ICD-10-CM | POA: Diagnosis present

## 2022-11-18 DIAGNOSIS — L03113 Cellulitis of right upper limb: Secondary | ICD-10-CM

## 2022-11-18 DIAGNOSIS — Z888 Allergy status to other drugs, medicaments and biological substances status: Secondary | ICD-10-CM

## 2022-11-18 DIAGNOSIS — E1122 Type 2 diabetes mellitus with diabetic chronic kidney disease: Secondary | ICD-10-CM | POA: Diagnosis present

## 2022-11-18 DIAGNOSIS — S50811A Abrasion of right forearm, initial encounter: Secondary | ICD-10-CM | POA: Diagnosis not present

## 2022-11-18 DIAGNOSIS — D649 Anemia, unspecified: Secondary | ICD-10-CM | POA: Diagnosis present

## 2022-11-18 DIAGNOSIS — I82C11 Acute embolism and thrombosis of right internal jugular vein: Secondary | ICD-10-CM | POA: Diagnosis present

## 2022-11-18 DIAGNOSIS — Z7982 Long term (current) use of aspirin: Secondary | ICD-10-CM | POA: Diagnosis not present

## 2022-11-18 DIAGNOSIS — Z9889 Other specified postprocedural states: Secondary | ICD-10-CM | POA: Diagnosis not present

## 2022-11-18 DIAGNOSIS — T827XXA Infection and inflammatory reaction due to other cardiac and vascular devices, implants and grafts, initial encounter: Secondary | ICD-10-CM | POA: Diagnosis present

## 2022-11-18 DIAGNOSIS — I770 Arteriovenous fistula, acquired: Secondary | ICD-10-CM | POA: Diagnosis not present

## 2022-11-18 DIAGNOSIS — Z91013 Allergy to seafood: Secondary | ICD-10-CM | POA: Diagnosis not present

## 2022-11-18 DIAGNOSIS — E1169 Type 2 diabetes mellitus with other specified complication: Secondary | ICD-10-CM | POA: Diagnosis not present

## 2022-11-18 DIAGNOSIS — L089 Local infection of the skin and subcutaneous tissue, unspecified: Secondary | ICD-10-CM | POA: Diagnosis not present

## 2022-11-18 DIAGNOSIS — Z95828 Presence of other vascular implants and grafts: Secondary | ICD-10-CM | POA: Diagnosis not present

## 2022-11-18 DIAGNOSIS — E119 Type 2 diabetes mellitus without complications: Secondary | ICD-10-CM

## 2022-11-18 LAB — CBC WITH DIFFERENTIAL/PLATELET
Abs Immature Granulocytes: 0.04 10*3/uL (ref 0.00–0.07)
Basophils Absolute: 0 10*3/uL (ref 0.0–0.1)
Basophils Relative: 0 %
Eosinophils Absolute: 0.5 10*3/uL (ref 0.0–0.5)
Eosinophils Relative: 5 %
HCT: 31.5 % — ABNORMAL LOW (ref 39.0–52.0)
Hemoglobin: 9.8 g/dL — ABNORMAL LOW (ref 13.0–17.0)
Immature Granulocytes: 0 %
Lymphocytes Relative: 21 %
Lymphs Abs: 2.2 10*3/uL (ref 0.7–4.0)
MCH: 30.1 pg (ref 26.0–34.0)
MCHC: 31.1 g/dL (ref 30.0–36.0)
MCV: 96.6 fL (ref 80.0–100.0)
Monocytes Absolute: 1 10*3/uL (ref 0.1–1.0)
Monocytes Relative: 9 %
Neutro Abs: 6.7 10*3/uL (ref 1.7–7.7)
Neutrophils Relative %: 65 %
Platelets: 95 10*3/uL — ABNORMAL LOW (ref 150–400)
RBC: 3.26 MIL/uL — ABNORMAL LOW (ref 4.22–5.81)
RDW: 16.1 % — ABNORMAL HIGH (ref 11.5–15.5)
WBC: 10.4 10*3/uL (ref 4.0–10.5)
nRBC: 0 % (ref 0.0–0.2)

## 2022-11-18 LAB — COMPREHENSIVE METABOLIC PANEL
ALT: 7 U/L (ref 0–44)
AST: 11 U/L — ABNORMAL LOW (ref 15–41)
Albumin: 3.4 g/dL — ABNORMAL LOW (ref 3.5–5.0)
Alkaline Phosphatase: 74 U/L (ref 38–126)
Anion gap: 13 (ref 5–15)
BUN: 69 mg/dL — ABNORMAL HIGH (ref 8–23)
CO2: 24 mmol/L (ref 22–32)
Calcium: 9.4 mg/dL (ref 8.9–10.3)
Chloride: 99 mmol/L (ref 98–111)
Creatinine, Ser: 9.45 mg/dL — ABNORMAL HIGH (ref 0.61–1.24)
GFR, Estimated: 5 mL/min — ABNORMAL LOW (ref >60)
Glucose, Bld: 137 mg/dL — ABNORMAL HIGH (ref 70–99)
Potassium: 4.3 mmol/L (ref 3.5–5.1)
Sodium: 136 mmol/L (ref 135–145)
Total Bilirubin: 0.7 mg/dL (ref 0.3–1.2)
Total Protein: 7.2 g/dL (ref 6.5–8.1)

## 2022-11-18 LAB — PROTIME-INR
INR: 1.4 — ABNORMAL HIGH (ref 0.8–1.2)
Prothrombin Time: 16.7 seconds — ABNORMAL HIGH (ref 11.4–15.2)

## 2022-11-18 LAB — APTT: aPTT: 37 seconds — ABNORMAL HIGH (ref 24–36)

## 2022-11-18 LAB — CBG MONITORING, ED: Glucose-Capillary: 92 mg/dL (ref 70–99)

## 2022-11-18 MED ORDER — VANCOMYCIN HCL 1750 MG/350ML IV SOLN
1750.0000 mg | Freq: Once | INTRAVENOUS | Status: AC
  Administered 2022-11-18: 1750 mg via INTRAVENOUS
  Filled 2022-11-18: qty 350

## 2022-11-18 MED ORDER — ONDANSETRON HCL 4 MG/2ML IJ SOLN: 4.0000 mg | Freq: Four times a day (QID) | INTRAMUSCULAR | Status: DC | PRN

## 2022-11-18 MED ORDER — SODIUM CHLORIDE 0.9 % IV SOLN
1.0000 g | INTRAVENOUS | Status: DC
  Administered 2022-11-18 – 2022-11-20 (×3): 1 g via INTRAVENOUS
  Filled 2022-11-18 (×3): qty 10

## 2022-11-18 MED ORDER — ACETAMINOPHEN 325 MG PO TABS
650.0000 mg | ORAL_TABLET | Freq: Four times a day (QID) | ORAL | Status: DC | PRN
  Administered 2022-11-20: 650 mg via ORAL

## 2022-11-18 MED ORDER — AMLODIPINE BESYLATE 10 MG PO TABS
10.0000 mg | ORAL_TABLET | ORAL | Status: DC
  Administered 2022-11-19 – 2022-11-25 (×5): 10 mg via ORAL
  Filled 2022-11-18 (×5): qty 1

## 2022-11-18 MED ORDER — HEPARIN BOLUS VIA INFUSION
4500.0000 [IU] | Freq: Once | INTRAVENOUS | Status: AC
  Administered 2022-11-18: 4500 [IU] via INTRAVENOUS
  Filled 2022-11-18: qty 4500

## 2022-11-18 MED ORDER — INSULIN ASPART 100 UNIT/ML IJ SOLN: 0.0000 [IU] | Freq: Every day | INTRAMUSCULAR | Status: DC

## 2022-11-18 MED ORDER — ONDANSETRON HCL 4 MG PO TABS: 4.0000 mg | ORAL_TABLET | Freq: Four times a day (QID) | ORAL | Status: DC | PRN

## 2022-11-18 MED ORDER — ACETAMINOPHEN 650 MG RE SUPP: 650.0000 mg | Freq: Four times a day (QID) | RECTAL | Status: DC | PRN

## 2022-11-18 MED ORDER — HYDROCODONE-ACETAMINOPHEN 5-325 MG PO TABS
2.0000 | ORAL_TABLET | Freq: Four times a day (QID) | ORAL | Status: DC | PRN
  Administered 2022-11-19 – 2022-11-24 (×6): 2 via ORAL
  Filled 2022-11-18 (×7): qty 2

## 2022-11-18 MED ORDER — HEPARIN (PORCINE) 25000 UT/250ML-% IV SOLN
1500.0000 [IU]/h | INTRAVENOUS | Status: DC
  Administered 2022-11-18: 1300 [IU]/h via INTRAVENOUS
  Administered 2022-11-19: 1600 [IU]/h via INTRAVENOUS
  Administered 2022-11-20: 1500 [IU]/h via INTRAVENOUS
  Filled 2022-11-18 (×3): qty 250

## 2022-11-18 MED ORDER — METOPROLOL TARTRATE 25 MG PO TABS
25.0000 mg | ORAL_TABLET | Freq: Two times a day (BID) | ORAL | Status: DC
  Administered 2022-11-18 – 2022-11-25 (×9): 25 mg via ORAL
  Filled 2022-11-18 (×10): qty 1

## 2022-11-18 MED ORDER — MORPHINE SULFATE (PF) 2 MG/ML IV SOLN
2.0000 mg | INTRAVENOUS | Status: DC | PRN
  Administered 2022-11-20 – 2022-11-24 (×3): 2 mg via INTRAVENOUS
  Filled 2022-11-18 (×2): qty 1

## 2022-11-18 MED ORDER — INSULIN ASPART 100 UNIT/ML IJ SOLN: 0.0000 [IU] | Freq: Three times a day (TID) | INTRAMUSCULAR | Status: DC

## 2022-11-18 MED ORDER — PRAVASTATIN SODIUM 20 MG PO TABS
20.0000 mg | ORAL_TABLET | Freq: Every day | ORAL | Status: DC
  Administered 2022-11-19 – 2022-11-24 (×4): 20 mg via ORAL
  Filled 2022-11-18 (×4): qty 1

## 2022-11-18 MED ORDER — SODIUM CHLORIDE 0.9 % IV SOLN
2.0000 g | Freq: Once | INTRAVENOUS | Status: AC
  Administered 2022-11-18: 2 g via INTRAVENOUS
  Filled 2022-11-18: qty 20

## 2022-11-18 MED ORDER — VANCOMYCIN HCL 750 MG/150ML IV SOLN: 750.0000 mg | INTRAVENOUS | Status: DC | PRN

## 2022-11-18 NOTE — Assessment & Plan Note (Signed)
Platelets stable at 95,000

## 2022-11-18 NOTE — ED Notes (Signed)
Patient is being discharged from the Urgent Care and sent to the [facility] Emergency Department via private vehicle with family member . Per Laurene Footman, PA, patient is in need of higher level of care due to severe arm swelling and infection. Patient is aware and verbalizes understanding of plan of care.  Vitals:   11/18/22 1518  BP: 130/65  Pulse: 75  Resp: 16  Temp: 98.7 F (37.1 C)  SpO2: 100%

## 2022-11-18 NOTE — ED Triage Notes (Signed)
Patient states that he had a catheter place in his right upper arm on 10/26/22 for his dialysis.  Patient reports swelling in his right after his surgery but over the past several days the swelling has gotten worse and has had yellow drainage from his incision.  Patient denies fevers.

## 2022-11-18 NOTE — Assessment & Plan Note (Signed)
-   Continue Uloric °

## 2022-11-18 NOTE — ED Provider Notes (Signed)
[facility]    CSN: [id number] Arrival date & time: 11/18/22  1459      History   Chief Complaint Chief Complaint  Patient presents with   Arm Swelling    right    HPI [name] [name] is a 82 y.o. male presenting with his daughter for approximately 2 to 3-day history of right arm redness, swelling, increased warmth and pain.  He did have an AV fistula placed in the right AC region on 10/30/2022.  He has a history of end-stage renal disease.  Patient is reporting that the area was completely closed up until a couple of days ago and now he has a small open wound and slight yellowish drainage from the wound.  Difficulty moving the elbow due to discomfort.  No associated fever or fatigue.  Patient is concerned because he says he had a relative die from an infection related to a fistula.  Other medical history significant for GERD, diabetes, anemia, hypertension, sleep apnea, thrombocytopenia.  HPI  Past Medical History:  Diagnosis Date   Anemia    Arthritis    Cancer ([place])    Chronic kidney disease    Diabetes mellitus without complication ([place])    ESRD (end stage renal disease) ([place])    GERD (gastroesophageal reflux disease)    h/o   Gout    Hyperlipidemia, mixed    Hypertension    Hypokalemia    Renal mass    Secondary hyperparathyroidism of renal origin ([place])    Sleep apnea    h/o no cpap   Thrombocytopenia ([place])     Patient Active Problem List   Diagnosis Date Noted   ESRD (end stage renal disease) on dialysis ([place]) 04/14/2022   Gout 04/14/2022   Acute lower UTI 04/14/2022   Peritoneal dialysis catheter dysfunction ([place]) 04/14/2022   Anemia 04/05/2022   PD catheter dysfunction (HCC)    Hypokalemia 12/19/2021   ESRD on dialysis ([place])    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    Chronic kidney disease, 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[place]) 09/20/2021   Seasonal allergic rhinitis due to pollen 12/15/2020   Ventral hernia without obstruction or gangrene 12/15/2020    Proteinuria 06/14/2019   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   Class 1 obesity due to excess calories with serious comorbidity and body mass index (BMI) of 34.0 to 34.9 in adult 03/28/2019   Benign essential hypertension 03/28/2019   History of gout 03/28/2019   Mixed hyperlipidemia 03/28/2019   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[place]) 06/28/2016   Thrombocytopenia ([place]) 05/07/2016   Chronic kidney disease, stage IV (severe) ([place]) 05/07/2016    Past Surgical History:  Procedure Laterality Date   APPENDECTOMY     AV FISTULA PLACEMENT Right 10/26/2022   Procedure: ARTERIOVENOUS (AV) FISTULA CREATION;  Surgeon: Algernon Huxley, MD;  Location: [facility];  Service: Vascular;  Laterality: Right;   Bunion Removal Right    CAPD INSERTION N/A 11/23/2021   Procedure: LAPAROSCOPIC INSERTION CONTINUOUS AMBULATORY PERITONEAL DIALYSIS  (CAPD) CATHETER;  Surgeon: Olean Ree, MD;  Location: [facility];  Service: General;  Laterality: N/A;   CAPD REVISION N/A 01/31/2022   Procedure: LAPAROSCOPIC REVISION CONTINUOUS AMBULATORY PERITONEAL DIALYSIS  (CAPD) CATHETER;  Surgeon: Olean Ree, MD;  Location: [facility];  Service: General;  Laterality: N/A;   CAPD REVISION N/A 04/06/2022   Procedure: LAPAROSCOPIC REVISION CONTINUOUS AMBULATORY PERITONEAL DIALYSIS  (  CAPD) CATHETER, replacement;  Surgeon: Olean Ree, MD;  Location: [facility];  Service: General;  Laterality: N/A;   CAPD REVISION N/A 04/14/2022   Procedure: LAPAROSCOPIC ASSISTED CONTINUOUS AMBULATORY PERITONEAL DIALYSIS  (CAPD) CATHETER REMOVAL;  Surgeon: Olean Ree, MD;  Location: [facility];  Service: General;  Laterality: N/A;   COLONOSCOPY WITH PROPOFOL N/A 06/14/2015   Procedure: COLONOSCOPY WITH PROPOFOL;  Surgeon: Lollie Sails, MD;  Location: [facility];  Service: Endoscopy;  Laterality: N/A;    DIALYSIS/PERMA CATHETER INSERTION N/A 10/03/2021   Procedure: DIALYSIS/PERMA CATHETER INSERTION;  Surgeon: Algernon Huxley, MD;  Location: [place];  Service: Cardiovascular;  Laterality: N/A;   EYE SURGERY     HERNIA REPAIR     abd   INSERTION OF MESH  11/23/2021   Procedure: INSERTION OF MESH;  Surgeon: Olean Ree, MD;  Location: [facility];  Service: General;;   PROSTATE BIOPSY N/A 03/27/2017   Procedure: PROSTATE BIOPSY-URO NAV;  Surgeon: Royston Cowper, MD;  Location: [facility];  Service: Urology;  Laterality: N/A;   PROSTATE BIOPSY N/A 09/11/2019   Procedure: PROSTATE BIOPSY Pearson [place];  Surgeon: Royston Cowper, MD;  Location: [facility];  Service: Urology;  Laterality: N/A;   RETINAL DETACHMENT SURGERY Left    TRANSURETHRAL RESECTION OF PROSTATE     XI ROBOTIC ASSISTED VENTRAL HERNIA N/A 11/23/2021   Procedure: XI ROBOTIC ASSISTED VENTRAL HERNIA, incisional;  Surgeon: Olean Ree, MD;  Location: [facility];  Service: General;  Laterality: N/A;       Home Medications    Prior to Admission medications   Medication Sig Start Date End Date Taking? Authorizing Provider  acetaminophen (TYLENOL) 500 MG tablet Take 2 tablets (1,000 mg total) by mouth every 6 (six) hours as needed for mild pain. Patient taking differently: Take 1,000 mg by mouth 2 (two) times daily. 01/31/22   Olean Ree, MD  amLODipine (NORVASC) 10 MG tablet Take 10 mg by mouth every morning.    [provider]  aspirin EC 81 MG tablet Take 81 mg by mouth daily. Swallow whole.    [provider]  azelastine (ASTELIN) 0.1 % nasal spray Place 1 spray into both nostrils 2 (two) times daily as needed for rhinitis. Use in each nostril as directed    [provider]  bicalutamide (CASODEX) 50 MG tablet Take 50 mg by mouth every morning. 10/23/19   [provider]  cinacalcet (SENSIPAR) 30 MG tablet Take 30 mg by mouth daily.    [provider]  enalapril (VASOTEC) 20 MG  tablet Take 20 mg by mouth 2 (two) times daily.     [provider]  fluticasone (FLONASE) 50 MCG/ACT nasal spray Place 1 spray into both nostrils daily as needed for allergies or rhinitis.    [provider]  HYDROcodone-acetaminophen (NORCO/VICODIN) 5-325 MG tablet Take 2 tablets by mouth every 6 (six) hours as needed for moderate pain. 10/26/22 10/26/23  Algernon Huxley, MD  lovastatin (MEVACOR) 20 MG tablet Take 20 mg by mouth at bedtime.    [provider]  metoprolol (LOPRESSOR) 50 MG tablet Take 25 mg by mouth 2 (two) times daily.    [provider]  polyethylene glycol (MIRALAX / GLYCOLAX) 17 g packet Take 17 g by mouth daily as needed.    [provider]  sodium bicarbonate 650 MG tablet Take 650 mg by mouth 2 (two) times daily.     [provider]  ULORIC 40 MG tablet  Take 40 mg by mouth every morning. 03/09/17   [provider]    Family History Family History  Problem Relation Age of Onset   Leukemia Father    Leukemia Brother     Social History Social History   Tobacco Use   Smoking status: Never   Smokeless tobacco: Never  Vaping Use   Vaping Use: Never used  Substance Use Topics   Alcohol use: Never   Drug use: No     Allergies   Fish allergy, Ciprofloxacin, and Doxazosin   Review of Systems Review of Systems  Constitutional:  Negative for fatigue and fever.  Musculoskeletal:  Positive for joint swelling. Negative for arthralgias.  Skin:  Positive for color change and wound.  Neurological:  Negative for weakness and numbness.     Physical Exam Triage Vital Signs ED Triage Vitals  Enc Vitals Group     BP 11/18/22 1518 130/65     Pulse Rate 11/18/22 1518 75     Resp 11/18/22 1518 16     Temp 11/18/22 1518 98.7 F (37.1 C)     Temp Source 11/18/22 1518 Oral     SpO2 11/18/22 1518 100 %     Weight 11/18/22 1516 174 lb 13.2 oz (79.3 kg)     Height 11/18/22 1516 5\' 9"  (1.753 m)     Head  Circumference --      Peak Flow --      Pain Score 11/18/22 1516 6     Pain Loc --      Pain Edu? --      Excl. in [place]? --    No data found.  Updated Vital Signs BP 130/65 (BP Location: Left Arm)   Pulse 75   Temp 98.7 F (37.1 C) (Oral)   Resp 16   Ht 5\' 9"  (1.753 m)   Wt 174 lb 13.2 oz (79.3 kg)   SpO2 100%   BMI 25.82 kg/m     Physical Exam Vitals and nursing note reviewed.  Constitutional:      General: He is not in acute distress.    Appearance: Normal appearance. He is well-developed. He is not ill-appearing.  HENT:     Head: Normocephalic and atraumatic.  Eyes:     General: No scleral icterus.    Conjunctiva/sclera: Conjunctivae normal.  Cardiovascular:     Rate and Rhythm: Normal rate and regular rhythm.     Heart sounds: Normal heart sounds.  Pulmonary:     Effort: Pulmonary effort is normal. No respiratory distress.     Breath sounds: Normal breath sounds.  Musculoskeletal:     Cervical back: Neck supple.  Skin:    General: Skin is warm and dry.     Capillary Refill: Capillary refill takes less than 2 seconds.     Comments: Right arm: See image below.  There is significant swelling, erythema, increased warmth of the right arm about the [facility] region.  Area is diffusely tender.  Slight yellowish drainage from medial aspect of wound.  Some discomfort with flexing the elbow.  Neurological:     General: No focal deficit present.     Mental Status: He is alert. Mental status is at baseline.     Motor: No weakness.     Gait: Gait normal.  Psychiatric:        Mood and Affect: Mood normal.      UC Treatments / Results  Labs (all labs ordered are listed, but only abnormal results  are displayed) Labs Reviewed - No data to display  EKG   Radiology No results found.  Procedures Procedures (including critical care time)  Medications Ordered in UC Medications - No data to display  Initial Impression / Assessment and Plan / UC Course  I have reviewed the  triage vital signs and the nursing notes.  Pertinent labs & imaging results that were available during my care of the patient were reviewed by me and considered in my medical decision making (see chart for details).   82 year old male with history of end-stage renal disease, diabetes, hypertension, thrombocytopenia presents for right arm swelling, pain, increased redness and warmth for the past couple of days.  He did have a new AV fistula placed on 10/26/2022 but had not had any issues with it until couple days ago.  Now has an open wound with slight pustular drainage and bleeding.  Currently he is afebrile.  See image available in chart.  Patient has significant swelling, erythema, increased warmth of the right arm and small area of open wound with drainage.  Advised patient and daughter of my significant concern for cellulitis versus DVT.  Advised that he needs ultrasound, lab work and if symptoms are due to infection he may need IV antibiotics.  Patient is agreeable to go to the emergency department.  Wound was redressed and he was given a sling for comfort.  Daughter is [facility] ED at this time.   Final Clinical Impressions(s) / UC Diagnoses   Final diagnoses:  Cellulitis of right arm  Right arm pain  A-V fistula [facility])     Discharge Instructions      You have been advised to follow up immediately in the emergency department for concerning signs.symptoms. If you declined EMS transport, please have a family member take you directly to the ED at this time. Do not delay. Based on concerns about condition, if you do not follow up in th e ED, you may risk poor outcomes including worsening of condition, delayed treatment and potentially life threatening issues. If you have declined to go to the ED at this time, you should call your PCP immediately to set up a follow up appointment.  Go to ED for red flag symptoms, including; fevers you cannot reduce with Tylenol/Motrin, severe headaches,  vision changes, numbness/weakness in part of the body, lethargy, confusion, intractable vomiting, severe dehydration, chest pain, breathing difficulty, severe persistent abdominal or pelvic pain, signs of severe infection (increased redness, swelling of an area), feeling faint or passing out, dizziness, etc. You should especially go to the ED for sudden acute worsening of condition if you do not elect to go at this time.     ED Prescriptions   None    PDMP not reviewed this encounter.   Danton Clap, PA-C 11/18/22 1537

## 2022-11-18 NOTE — Assessment & Plan Note (Signed)
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

## 2022-11-18 NOTE — Consult Note (Signed)
Pharmacy Antibiotic Note  [name] [name] is a 82 y.o. male admitted on 11/18/2022 with internal jugular thromboembolism.  Pharmacy has been consulted for vancomycin and cefepime dosing for wound infection.  Vancomycin 1750 mg IV x 1 given 3/30 @ 1811  Plan: Give vancomycin 750 mg IV after each dialysis session Start cefepime 1 gram IV every 24 hours Follow cultures and HD schedule for adjustments  Height: 5\' 9"  (175.3 cm) Weight: 79.4 kg (175 lb) IBW/kg (Calculated) : 70.7  Temp (24hrs), Avg:98.4 F (36.9 C), Min:98.2 F (36.8 C), Max:98.7 F (37.1 C)  Recent Labs  Lab 11/18/22 1700  WBC 10.4  CREATININE 9.45*    Estimated Creatinine Clearance: 6.2 mL/min (A) (by C-G formula based on SCr of 9.45 mg/dL (H)).    Allergies  Allergen Reactions   Fish Allergy Swelling   Ciprofloxacin     Unknown reaction   Doxazosin Other (See Comments)    unknown    Antimicrobials this admission: vancomycin 3/30 >>  cefepime 3/30 >>  Ceftriaxone 3/30 x 1  Dose adjustments this admission: N/A  Microbiology results: 3/30 BCx: pending 3/30 Wound: pending  Thank you for allowing pharmacy to be a part of this patient's care.  Lorin Picket, PharmD 11/18/2022 9:37 PM

## 2022-11-18 NOTE — ED Notes (Signed)
IV access attempted without success. IV team consult placed.  °

## 2022-11-18 NOTE — Assessment & Plan Note (Addendum)
S/p AV fistula creation on 10/26/2022 Provoked thromboembolism - Heparin infusion - Vascular consult ---per Dr. Shelia Media, for likely thrombectomy on Monday 4/1 - Keep limb elevated to assist with swelling - Pain control

## 2022-11-18 NOTE — Assessment & Plan Note (Signed)
Sliding scale insulin coverage 

## 2022-11-18 NOTE — Consult Note (Signed)
[place] for heparin infusion Indication: VTE treatment (possible upper extremity DVT)  Allergies  Allergen Reactions   Fish Allergy Swelling   Ciprofloxacin     Unknown reaction   Doxazosin Other (See Comments)    unknown    Patient Measurements: Height: 5\' 9"  (175.3 cm) Weight: 79.4 kg (175 lb) IBW/kg (Calculated) : 70.7 Heparin Dosing Weight: 79.4 kg  Vital Signs: Temp: 98.2 F (36.8 C) (03/30 1616) Temp Source: Axillary (03/30 1616) BP: 131/95 (03/30 1616) Pulse Rate: 75 (03/30 1616)  Labs: Recent Labs    11/18/22 1700 11/18/22 1756  HGB 9.8*  --   HCT 31.5*  --   PLT 95*  --   APTT  --  37*  LABPROT  --  16.7*  INR  --  1.4*  CREATININE 9.45*  --     Estimated Creatinine Clearance: 6.2 mL/min (A) (by C-G formula based on SCr of 9.45 mg/dL (H)).   Medical History: Past Medical History:  Diagnosis Date   Anemia    Arthritis    Cancer ([place])    Chronic kidney disease    Diabetes mellitus without complication (HCC)    ESRD (end stage renal disease) ([place])    GERD (gastroesophageal reflux disease)    h/o   Gout    Hyperlipidemia, mixed    Hypertension    Hypokalemia    Renal mass    Secondary hyperparathyroidism of renal origin ([place])    Sleep apnea    h/o no cpap   Thrombocytopenia (HCC)     Medications:  No home anticoagulation per pharmacist review  Assessment: 82 yo male presented to ED with right arm swelling.  Patient has ESRD on hemodialysis with right AV fistula that was placed on 10/26/22.  Pharmacy consulted to start heparin infusion due to concern for upper extremity DVT.  Baseline Labs: hgb 9.8, hct 31.5, plt 95, INR 1.4, aPTT 37 Goal of Therapy:  Heparin level 0.3-0.7 units/ml Monitor platelets by anticoagulation protocol: Yes   Plan:  Give 4500 units bolus x 1 Start heparin infusion at 1300 units/hr Check anti-Xa level in 8 hours and daily while on heparin Continue to monitor H&H and  platelets  Lorin Picket, PharmD 11/18/2022,7:51 PM

## 2022-11-18 NOTE — Discharge Instructions (Signed)

## 2022-11-18 NOTE — Assessment & Plan Note (Signed)
Stable.  Continue home metoprolol and amlodipine

## 2022-11-18 NOTE — Progress Notes (Signed)
A STAT consult was placed to the hospital's IV Nurse for IV access; upon entering the room, found the pt having an ultrasound done; staff requested to "come back in 30 minutes;"  RN and MD aware.

## 2022-11-18 NOTE — ED Triage Notes (Signed)
Patient had dialysis catheter placed on 10/26/22. Since surgery patient has experienced swelling that has progressively gotten worse. Also reports watery, yellow drainage from site.

## 2022-11-18 NOTE — Assessment & Plan Note (Addendum)
Nephrology consult for continuation of dialysis via tunneled catheter MWF

## 2022-11-18 NOTE — ED Provider Notes (Signed)
[facility] Provider Note    Event Date/Time   First MD Initiated Contact with Patient 11/18/22 1614     (approximate)   History   Cellulitis and Arm Pain   HPI  [name] [name] is a 82 y.o. male past medical history of end-stage renal disease, diabetes who presents because of right arm swelling.  Patient had a right brachiocephalic AV fistula placed by Dr. Lucky Cowboy on 3/7.  Was doing okay until about 3 days ago when he noted swelling of the right arm.  Swelling has increased since then he has associated pain and redness.  There is drainage from the incision site.  Denies fevers chills denies shortness of breath or chest pain.  Patient last had dialysis from a HD catheter which is on the right side.     Past Medical History:  Diagnosis Date   Anemia    Arthritis    Cancer ([place])    Chronic kidney disease    Diabetes mellitus without complication ([place])    ESRD (end stage renal disease) ([place])    GERD (gastroesophageal reflux disease)    h/o   Gout    Hyperlipidemia, mixed    Hypertension    Hypokalemia    Renal mass    Secondary hyperparathyroidism of renal origin ([place])    Sleep apnea    h/o no cpap   Thrombocytopenia ([place])     Patient Active Problem List   Diagnosis Date Noted   ESRD (end stage renal disease) on dialysis ([place]) 04/14/2022   Gout 04/14/2022   Acute lower UTI 04/14/2022   Peritoneal dialysis catheter dysfunction ([place]) 04/14/2022   Anemia 04/05/2022   PD catheter dysfunction (HCC)    Hypokalemia 12/19/2021   ESRD on dialysis ([place])    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    Chronic kidney disease, stage V ([place]) 09/20/2021   Seasonal allergic rhinitis due to pollen 12/15/2020   Ventral hernia without obstruction or gangrene 12/15/2020   Proteinuria 06/14/2019   Secondary hyperparathyroidism of renal origin ([place]) 06/14/2019   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   Class 1 obesity due to excess calories with  serious comorbidity and body mass index (BMI) of 34.0 to 34.9 in adult 03/28/2019   Benign essential hypertension 03/28/2019   History of gout 03/28/2019   Mixed hyperlipidemia 03/28/2019   Type 2 diabetes mellitus with chronic kidney disease ([place]) 03/28/2019   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[place]) 06/28/2016   Thrombocytopenia ([place]) 05/07/2016   Chronic kidney disease, stage IV (severe) ([place]) 05/07/2016     Physical Exam  Triage Vital Signs: ED Triage Vitals  Enc Vitals Group     BP 11/18/22 1616 (!) 131/95     Pulse Rate 11/18/22 1616 75     Resp 11/18/22 1616 18     Temp 11/18/22 1616 98.2 F (36.8 C)     Temp Source 11/18/22 1616 Axillary     SpO2 11/18/22 1616 100 %     Weight 11/18/22 1605 175 lb (79.4 kg)     Height 11/18/22 1605 5\' 9"  (1.753 m)     Head Circumference --      Peak Flow --      Pain Score 11/18/22 1604 7     Pain Loc --      Pain Edu? --      Excl. in [place]? --     Most recent vital signs: Vitals:   11/18/22 1616  BP: (!) 131/95  Pulse: 75  Resp: 18  Temp: 98.2 F (36.8 C)  SpO2: 100%     General: Awake, no distress.  CV:  Good peripheral perfusion.  Resp:  Normal effort.  Abd:  No distention.  Neuro:             Awake, Alert, Oriented x 3  Other:  Right arm exhibits significant swelling from the hand up into the upper arm, there is redness in the [facility] fossa extending up the medial side of the arm, there is cloudy drainage from the medial aspect of the AV fistula incision Fistula is pulsatile with palpable thrill There is edema over the olecranon but this is nontender not erythematous and patient is able to range the elbow without pain with micro movements       ED Results / Procedures / Treatments  Labs (all labs ordered are listed, but only abnormal results are displayed) Labs Reviewed  COMPREHENSIVE METABOLIC PANEL - Abnormal; Notable for the following components:      Result Value   Glucose, Bld 137 (*)    BUN 69 (*)    Creatinine,  Ser 9.45 (*)    Albumin 3.4 (*)    AST 11 (*)    GFR, Estimated 5 (*)    All other components within normal limits  CBC WITH DIFFERENTIAL/PLATELET - Abnormal; Notable for the following components:   RBC 3.26 (*)    Hemoglobin 9.8 (*)    HCT 31.5 (*)    RDW 16.1 (*)    Platelets 95 (*)    All other components within normal limits  PROTIME-INR - Abnormal; Notable for the following components:   Prothrombin Time 16.7 (*)    INR 1.4 (*)    All other components within normal limits  APTT - Abnormal; Notable for the following components:   aPTT 37 (*)    All other components within normal limits  CULTURE, BLOOD (ROUTINE X 2)  CULTURE, BLOOD (ROUTINE X 2)  AEROBIC CULTURE W GRAM STAIN (SUPERFICIAL SPECIMEN)  PROCALCITONIN  HEPARIN LEVEL (UNFRACTIONATED)     EKG     RADIOLOGY I reviewed and interpreted patient's upper extremity DVT ultrasound which shows right IJ DVT   PROCEDURES:  Critical Care performed: Yes, see critical care procedure note(s)  .Critical Care  Performed by: Rada Hay, MD Authorized by: Rada Hay, MD   Critical care provider statement:    Critical care time (minutes):  30   Critical care was time spent personally by me on the following activities:  Development of treatment plan with patient or surrogate, discussions with consultants, evaluation of patient's response to treatment, examination of patient, ordering and review of laboratory studies, ordering and review of radiographic studies, ordering and performing treatments and interventions, pulse oximetry, re-evaluation of patient's condition and review of old charts   The patient is on the cardiac monitor to evaluate for evidence of arrhythmia and/or significant heart rate changes.   MEDICATIONS ORDERED IN ED: Medications  heparin bolus via infusion 4,500 Units (has no administration in time range)  heparin ADULT infusion 100 units/mL (25000 units/243mL) (has no administration in  time range)  vancomycin (VANCOREADY) IVPB 1750 mg/350 mL (0 mg Intravenous Stopped 11/18/22 2013)  cefTRIAXone (ROCEPHIN) 2 g in sodium chloride 0.9 % 100 mL IVPB (0 g Intravenous Stopped 11/18/22 1850)     IMPRESSION / MDM / ASSESSMENT AND PLAN / ED COURSE  I reviewed the triage vital signs and the nursing notes.  Patient's presentation is most consistent with acute presentation with potential threat to life or bodily function.  Differential diagnosis includes, but is not limited to, upper extremity DVT, cellulitis/abscess, less likely necrotizing infection  The patient is an 82 year old male with end-stage renal disease on dialysis currently through an HD catheter who recently had a right AV brachiocephalic fistula placed on the seventh of this month presents with arm swelling.  This has been going for about 3 days he has no associated fevers or other systemic symptoms.  There is pain associated with the swelling.  On exam patient has a massive amount of swelling in the right arm which is pictured above.  There is edema all the way from the fingers up into the mid upper arm.  There is erythema warmth and induration in the proximal forearm extending up into the mid upper arm.  It is associated with warmth and some mild tenderness.  There is some spontaneous cloudy drainage from the AV fistula wound as well.  Fistula does appear to have a thrill and is pulsatile.  I am clinically concerned for both venous occlusion causing the swelling as well as infection.  Will cover with vancomycin and ceftriaxone.  I have ordered a upper extremity DVT study as well as a arterial ultrasound to evaluate for fistula.  Will obtain blood cultures as well as wound culture from the fistula site.  Patient's labs are overall reassuring.  Hemoglobin is 9.8 but patient has history of anemia of chronic disease and has required transfusion before.  0.63 weeks ago but patient is denying any active  bleeding blood in his stool or melena.  He has no leukocytosis.  On ultrasound patient does have a nonocclusive thrombus in the right IJ and axillary veins.  Arterial ultrasound shows patent AV fistula.  I discussed with Dr. Concha Pyo on-call for vascular who agrees with heparin and admission and likely patient will need thrombectomy on Monday.  I did order patient antibiotics prior to ultrasound results.  I suspect that most of the erythema is due to thrombus and antibiotics can likely be held.  Did send culture from the drainage from the wound.  Patient will be admitted to the hospital.      FINAL CLINICAL IMPRESSION(S) / ED DIAGNOSES   Final diagnoses:  Acute deep vein thrombosis (DVT) of axillary vein of right upper extremity (HCC)  Internal jugular vein thrombosis, right ([place])     Rx / DC Orders   ED Discharge Orders     None        Note:  This document was prepared using Dragon voice recognition software and may include unintentional dictation errors.   Rada Hay, MD 11/18/22 2014

## 2022-11-18 NOTE — H&P (Signed)
History and Physical    Patient: [name] [name] [id number] DOB: 1941-02-07 DOA: 11/18/2022 DOS: the patient was seen and examined on 11/18/2022 PCP: Dion Body, MD  Patient coming from: Home  Chief Complaint:  Chief Complaint  Patient presents with   Cellulitis   Arm Pain    HPI: Chais Berger is a 82 y.o. male with medical history significant for DM, HTN, gout, , anemia of CKD, essential thrombocytopenia, ESRD on HD MWF via tunneled catheter, who is s/p placement of right AV fistula on 10/26/2022 who presents to the ED with a 3-day history of pain, swelling and redness in the arm extending from the fingers to the mid upper arm.  He is also noted cloudy drainage from the incision site.  He denies fever or chills. ED course and data review: Vitals within normal limits.  CBC notable for hemoglobin of 9.8, down from 12.6 about 3 weeks prior although baseline is around 7.5.  Platelets at baseline at 95,000.  CMP at baseline.  INR 1.4. Right upper extremity venous Dopplers showing nonocclusive thrombus in the right IJ and axillary veins. Right upper extremity arterial duplex showing patent right radiocephalic AV fistula Chest x-ray showed normal placement of catheter. The ED provider spoke with vascular surgeon Dr. Concha Pyo who recommended IV heparin with plans for thrombectomy on Monday/1/24. Patient was started on heparin.  Cultures were taken of the wound discharge, blood cultures done and patient was also treated with ceftriaxone and vancomycin for possible wound infection.  Hospitalist consulted for admission.   Review of Systems: As mentioned in the history of present illness. All other systems reviewed and are negative.  Past Medical History:  Diagnosis Date   Anemia    Arthritis    Cancer ([place])    Chronic kidney disease    Diabetes mellitus without complication ([place])    ESRD (end stage renal disease) ([place])    GERD (gastroesophageal reflux disease)    h/o   Gout     Hyperlipidemia, mixed    Hypertension    Hypokalemia    Renal mass    Secondary hyperparathyroidism of renal origin ([place])    Sleep apnea    h/o no cpap   Thrombocytopenia ([place])    Past Surgical History:  Procedure Laterality Date   APPENDECTOMY     AV FISTULA PLACEMENT Right 10/26/2022   Procedure: ARTERIOVENOUS (AV) FISTULA CREATION;  Surgeon: Algernon Huxley, MD;  Location: [facility] ORS;  Service: Vascular;  Laterality: Right;   Bunion Removal Right    CAPD INSERTION N/A 11/23/2021   Procedure: LAPAROSCOPIC INSERTION CONTINUOUS AMBULATORY PERITONEAL DIALYSIS  (CAPD) CATHETER;  Surgeon: Olean Ree, MD;  Location: [facility] ORS;  Service: General;  Laterality: N/A;   CAPD REVISION N/A 01/31/2022   Procedure: LAPAROSCOPIC REVISION CONTINUOUS AMBULATORY PERITONEAL DIALYSIS  (CAPD) CATHETER;  Surgeon: Olean Ree, MD;  Location: [facility] ORS;  Service: General;  Laterality: N/A;   CAPD REVISION N/A 04/06/2022   Procedure: LAPAROSCOPIC REVISION CONTINUOUS AMBULATORY PERITONEAL DIALYSIS  (CAPD) CATHETER, replacement;  Surgeon: Olean Ree, MD;  Location: [facility] ORS;  Service: General;  Laterality: N/A;   CAPD REVISION N/A 04/14/2022   Procedure: LAPAROSCOPIC ASSISTED CONTINUOUS AMBULATORY PERITONEAL DIALYSIS  (CAPD) CATHETER REMOVAL;  Surgeon: Olean Ree, MD;  Location: [facility] ORS;  Service: General;  Laterality: N/A;   COLONOSCOPY WITH PROPOFOL N/A 06/14/2015   Procedure: COLONOSCOPY WITH PROPOFOL;  Surgeon: Lollie Sails, MD;  Location[facility] ENDOSCOPY;  Service: Endoscopy;  Laterality: N/A;   DIALYSIS/PERMA CATHETER INSERTION N/A  10/03/2021   Procedure: DIALYSIS/PERMA CATHETER INSERTION;  Surgeon: Algernon Huxley, MD;  Location: [facility];  Service: Cardiovascular;  Laterality: N/A;   EYE SURGERY     HERNIA REPAIR     abd   INSERTION OF MESH  11/23/2021   Procedure: INSERTION OF MESH;  Surgeon: Olean Ree, MD;  Location: [facility] ORS;  Service: General;;   PROSTATE BIOPSY N/A 03/27/2017    Procedure: PROSTATE BIOPSY-URO NAV;  Surgeon: Royston Cowper, MD;  Location: [facility] ORS;  Service: Urology;  Laterality: N/A;   PROSTATE BIOPSY N/A 09/11/2019   Procedure: PROSTATE BIOPSY Pearson [place];  Surgeon: Royston Cowper, MD;  Location: [facility] ORS;  Service: Urology;  Laterality: N/A;   RETINAL DETACHMENT SURGERY Left    TRANSURETHRAL RESECTION OF PROSTATE     XI ROBOTIC ASSISTED VENTRAL HERNIA N/A 11/23/2021   Procedure: XI ROBOTIC ASSISTED VENTRAL HERNIA, incisional;  Surgeon: Olean Ree, MD;  Location: [facility] ORS;  Service: General;  Laterality: N/A;   Social History:  reports that he has never smoked. He has never used smokeless tobacco. He reports that he does not currently use alcohol. He reports that he does not use drugs.  Allergies  Allergen Reactions   Fish Allergy Swelling   Ciprofloxacin     Unknown reaction   Doxazosin Other (See Comments)    unknown    Family History  Problem Relation Age of Onset   Leukemia Father    Leukemia Brother     Prior to Admission medications   Medication Sig Start Date End Date Taking? Authorizing Provider  acetaminophen (TYLENOL) 500 MG tablet Take 2 tablets (1,000 mg total) by mouth every 6 (six) hours as needed for mild pain. Patient taking differently: Take 1,000 mg by mouth 2 (two) times daily. 01/31/22   Olean Ree, MD  amLODipine (NORVASC) 10 MG tablet Take 10 mg by mouth every morning.    [provider]  aspirin EC 81 MG tablet Take 81 mg by mouth daily. Swallow whole.    [provider]  azelastine (ASTELIN) 0.1 % nasal spray Place 1 spray into both nostrils 2 (two) times daily as needed for rhinitis. Use in each nostril as directed    [provider]  bicalutamide (CASODEX) 50 MG tablet Take 50 mg by mouth every morning. 10/23/19   [provider]  cinacalcet (SENSIPAR) 30 MG tablet Take 30 mg by mouth daily.    [provider]  enalapril (VASOTEC) 20 MG tablet Take 20 mg by mouth  2 (two) times daily.     [provider]  fluticasone (FLONASE) 50 MCG/ACT nasal spray Place 1 spray into both nostrils daily as needed for allergies or rhinitis.    [provider]  HYDROcodone-acetaminophen (NORCO/VICODIN) 5-325 MG tablet Take 2 tablets by mouth every 6 (six) hours as needed for moderate pain. 10/26/22 10/26/23  Algernon Huxley, MD  lovastatin (MEVACOR) 20 MG tablet Take 20 mg by mouth at bedtime.    [provider]  metoprolol (LOPRESSOR) 50 MG tablet Take 25 mg by mouth 2 (two) times daily.    [provider]  polyethylene glycol (MIRALAX / GLYCOLAX) 17 g packet Take 17 g by mouth daily as needed.    [provider]  sodium bicarbonate 650 MG tablet Take 650 mg by mouth 2 (two) times daily.     [provider]  ULORIC 40 MG tablet Take 40 mg by mouth every morning. 03/09/17   [provider]    Physical Exam: Vitals:   11/18/22 1605 11/18/22 1616 11/18/22 2020  BP:  (!) 131/95 (!) 144/63  Pulse:  75 79  Resp:  18 16  Temp:  98.2 F (36.8 C) 98.3 F (36.8 C)  TempSrc:  Axillary Oral  SpO2:  100% 100%  Weight: 79.4 kg    Height: 5\' 9"  (1.753 m)     Physical Exam  Labs on Admission: I have personally reviewed following labs and imaging studies  CBC: Recent Labs  Lab 11/18/22 1700  WBC 10.4  NEUTROABS 6.7  HGB 9.8*  HCT 31.5*  MCV 96.6  PLT 95*   Basic Metabolic Panel: Recent Labs  Lab 11/18/22 1700  NA 136  K 4.3  CL 99  CO2 24  GLUCOSE 137*  BUN 69*  CREATININE 9.45*  CALCIUM 9.4   GFR: Estimated Creatinine Clearance: 6.2 mL/min (A) (by C-G formula based on SCr of 9.45 mg/dL (H)). Liver Function Tests: Recent Labs  Lab 11/18/22 1700  AST 11*  ALT 7  ALKPHOS 74  BILITOT 0.7  PROT 7.2  ALBUMIN 3.4*   No results for input(s): "LIPASE", "AMYLASE" in the last 168 hours. No results for input(s): "AMMONIA" in the last 168 hours. Coagulation Profile: Recent Labs  Lab 11/18/22 1756   INR 1.4*   Cardiac Enzymes: No results for input(s): "CKTOTAL", "CKMB", "CKMBINDEX", "TROPONINI" in the last 168 hours. BNP (last 3 results) No results for input(s): "PROBNP" in the last 8760 hours. HbA1C: No results for input(s): "HGBA1C" in the last 72 hours. CBG: No results for input(s): "GLUCAP" in the last 168 hours. Lipid Profile: No results for input(s): "CHOL", "HDL", "LDLCALC", "TRIG", "CHOLHDL", "LDLDIRECT" in the last 72 hours. Thyroid Function Tests: No results for input(s): "TSH", "T4TOTAL", "FREET4", "T3FREE", "THYROIDAB" in the last 72 hours. Anemia Panel: No results for input(s): "VITAMINB12", "FOLATE", "FERRITIN", "TIBC", "IRON", "RETICCTPCT" in the last 72 hours. Urine analysis:    Component Value Date/Time   COLORURINE STRAW (A) 04/14/2022 0011   APPEARANCEUR HAZY (A) 04/14/2022 0011   APPEARANCEUR Cloudy 10/11/2012 1930   LABSPEC 1.006 04/14/2022 0011   LABSPEC 1.018 10/11/2012 1930   PHURINE 9.0 (H) 04/14/2022 0011   GLUCOSEU NEGATIVE 04/14/2022 0011   GLUCOSEU Negative 10/11/2012 1930   HGBUR LARGE (A) 04/14/2022 0011   BILIRUBINUR NEGATIVE 04/14/2022 0011   BILIRUBINUR Negative 10/11/2012 1930   KETONESUR NEGATIVE 04/14/2022 0011   PROTEINUR 100 (A) 04/14/2022 0011   NITRITE NEGATIVE 04/14/2022 0011   LEUKOCYTESUR NEGATIVE 04/14/2022 0011   LEUKOCYTESUR Negative 10/11/2012 1930    Radiological Exams on Admission: [place] Upper Ext Art Right Ltd  Result Date: 11/18/2022 CLINICAL DATA:  Recent right AV fistula, new swelling EXAM: RIGHT UPPER EXTREMITY ARTERIAL DUPLEX SCAN TECHNIQUE: Gray-scale sonography as well as color Doppler and duplex ultrasound was performed to evaluate the arteries of the upper extremity. COMPARISON:  None Available. FINDINGS: Sonographic evaluation of the right upper extremity AV fistula was performed. A radiocephalic right upper extremity AV fistula is identified. Inflow artery: 3.2 mm, 60.69 cm/S Arterial anastomosis: 3.8 mm, 281.32  cm/S Fistula: Proximal: 6.4 mm, 239.91 cm/S Mid: 7.0 mm, 199.93 cm/S Distal: 11.3 mm, 471.25 cm/S Normal color flow and Doppler waveforms are seen within the visualized right radial artery and right cephalic vein. IMPRESSION: 1. Patent right radiocephalic AV fistula as above. Electronically Signed   By: Randa Ngo M.D.   On: 11/18/2022 20:05   US Venous Img Upper Uni Right(DVT)  Result Date:  11/18/2022 CLINICAL DATA:  Arm swelling.  AV fistula right arm. EXAM: Right UPPER EXTREMITY VENOUS DOPPLER ULTRASOUND TECHNIQUE: Gray-scale sonography with graded compression, as well as color Doppler and duplex ultrasound were performed to evaluate the upper extremity deep venous system from the level of the subclavian vein and including the jugular, axillary, basilic, radial, ulnar and upper cephalic vein. Spectral Doppler was utilized to evaluate flow at rest and with distal augmentation maneuvers. COMPARISON:  None Available. FINDINGS: Contralateral Subclavian Vein: Respiratory phasicity is normal and symmetric with the symptomatic side. No evidence of thrombus. Normal compressibility. Internal Jugular Vein: Nonocclusive thrombus noted in the right internal jugular vein. Subclavian Vein: No evidence of thrombus. Normal compressibility, respiratory phasicity and response to augmentation. Axillary Vein: Nonocclusive thrombus noted in the axillary vein. Cephalic Vein: No evidence of thrombus. Normal compressibility, respiratory phasicity and response to augmentation. Basilic Vein: No evidence of thrombus. Normal compressibility, respiratory phasicity and response to augmentation. Brachial Veins: No evidence of thrombus. Normal compressibility, respiratory phasicity and response to augmentation. Radial Veins: No evidence of thrombus. Normal compressibility, respiratory phasicity and response to augmentation. Ulnar Veins: No evidence of thrombus. Normal compressibility, respiratory phasicity and response to augmentation.  Venous Reflux:  None visualized. Other Findings: Brisk and pulsatile flow in the larger veins related to the presence of the fistula. IMPRESSION: Nonocclusive thrombus noted in the right internal jugular and axillary veins. Electronically Signed   By: Nelson Chimes M.D.   On: 11/18/2022 19:51   DG Chest Portable 1 View  Result Date: 11/18/2022 CLINICAL DATA:  Catheter placement. EXAM: PORTABLE CHEST 1 VIEW COMPARISON:  Chest radiograph dated 03/03/2022. FINDINGS: Right IJ dual-lumen dialysis catheter with tips over central SVC close to the cavoatrial junction. No pneumothorax. No focal consolidation or pleural effusion. The cardiac silhouette is within normal limits. No acute osseous pathology. IMPRESSION: Right IJ dual-lumen dialysis catheter with tips over the central SVC. No pneumothorax. Electronically Signed   By: Anner Crete M.D.   On: 11/18/2022 19:17     Data Reviewed: Relevant notes from primary care and specialist visits, past discharge summaries as available in EHR, including Care Everywhere. Prior diagnostic testing as pertinent to current admission diagnoses Updated medications and problem lists for reconciliation ED course, including vitals, labs, imaging, treatment and response to treatment Triage notes, nursing and pharmacy notes and ED provider's notes Notable results as noted in HPI   Assessment and Plan: * Internal jugular (IJ) vein thromboembolism, acute, right (HCC) S/p AV fistula creation on 10/26/2022 Provoked thromboembolism - Heparin infusion - Vascular consult ---per Dr. Shelia Media, for likely thrombectomy on Monday 4/1 - Keep limb elevated to assist with swelling - Pain control  Cellulitis of right arm Wound infection suspected s/p AV fistula creation on 10/26/2022 Patient presenting with cloudy drainage from incision for AV fistula creation as well as pain swelling redness and warmth around incision site - Continue Rocephin and vancomycin - Follow blood and wound  cultures  ESRD (end stage renal disease) on dialysis [facility]) Nephrology consult for continuation of dialysis via tunneled catheter MWF  Acute on chronic anemia Anemia in CKD Chronic thrombocytopenia Hemoglobin 9.8 down from 12.6 three weeks prior Possibly related to blood loss from procedure on 3/7 Serial H&H to evaluate for stability, especially in the setting of chronic thrombocytopenia and slightly elevated INR of 1.4 Transfuse for hemoglobin less than 7 or greater than 2 point drop in 6 hours  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[place]) Platelets stable at 95,000  Benign essential hypertension Stable.  Continue home metoprolol and amlodipine  Gout Continue Uloric  Diabetes mellitus, type II (HCC) Sliding scale insulin coverage    DVT prophylaxis: Heparin infusion  Consults: Vascular, Dr. Shelia Media, nephrology, Dr. Juleen China  Advance Care Planning:   Code Status: Prior   Family Communication: none  Disposition Plan: Back to previous home environment  Severity of Illness: The appropriate patient status for this patient is INPATIENT. Inpatient status is judged to be reasonable and necessary in order to provide the required intensity of service to ensure the patient's safety. The patient's presenting symptoms, physical exam findings, and initial radiographic and laboratory data in the context of their chronic comorbidities is felt to place them at high risk for further clinical deterioration. Furthermore, it is not anticipated that the patient will be medically stable for discharge from the hospital within 2 midnights of admission.   * I certify that at the point of admission it is my clinical judgment that the patient will require inpatient hospital care spanning beyond 2 midnights from the point of admission due to high intensity of service, high risk for further deterioration and high frequency of surveillance required.*  Author: Athena Masse, MD 11/18/2022 9:13 PM  For on call review  [email or web address].

## 2022-11-18 NOTE — Assessment & Plan Note (Signed)
Wound infection suspected s/p AV fistula creation on 10/26/2022 Patient presenting with cloudy drainage from incision for AV fistula creation as well as pain swelling redness and warmth around incision site - Continue Rocephin and vancomycin - Follow blood and wound cultures

## 2022-11-19 DIAGNOSIS — I82C11 Acute embolism and thrombosis of right internal jugular vein: Secondary | ICD-10-CM | POA: Diagnosis not present

## 2022-11-19 LAB — HEPARIN LEVEL (UNFRACTIONATED)
Heparin Unfractionated: 0.23 IU/mL — ABNORMAL LOW (ref 0.30–0.70)
Heparin Unfractionated: 0.79 IU/mL — ABNORMAL HIGH (ref 0.30–0.70)

## 2022-11-19 LAB — HEMOGLOBIN
Hemoglobin: 9.9 g/dL — ABNORMAL LOW (ref 13.0–17.0)
Hemoglobin: 10.3 g/dL — ABNORMAL LOW (ref 13.0–17.0)

## 2022-11-19 LAB — GLUCOSE, CAPILLARY
Glucose-Capillary: 90 mg/dL (ref 70–99)
Glucose-Capillary: 77 mg/dL (ref 70–99)
Glucose-Capillary: 128 mg/dL — ABNORMAL HIGH (ref 70–99)
Glucose-Capillary: 147 mg/dL — ABNORMAL HIGH (ref 70–99)

## 2022-11-19 LAB — PROCALCITONIN: Procalcitonin: 19.94 ng/mL

## 2022-11-19 MED ORDER — VANCOMYCIN HCL 750 MG/150ML IV SOLN
750.0000 mg | INTRAVENOUS | Status: DC
  Administered 2022-11-20 – 2022-11-24 (×3): 750 mg via INTRAVENOUS
  Filled 2022-11-19 (×5): qty 150

## 2022-11-19 MED ORDER — HEPARIN BOLUS VIA INFUSION
1200.0000 [IU] | Freq: Once | INTRAVENOUS | Status: AC
  Administered 2022-11-19: 1200 [IU] via INTRAVENOUS
  Filled 2022-11-19: qty 1200

## 2022-11-19 MED ORDER — VANCOMYCIN VARIABLE DOSE PER UNSTABLE RENAL FUNCTION (PHARMACIST DOSING): Status: DC

## 2022-11-19 MED ORDER — CALCITRIOL 0.25 MCG PO CAPS
0.7500 ug | ORAL_CAPSULE | ORAL | Status: DC
  Administered 2022-11-22: 0.75 ug via ORAL
  Filled 2022-11-19 (×3): qty 3

## 2022-11-19 MED ORDER — CHLORHEXIDINE GLUCONATE CLOTH 2 % EX PADS
6.0000 | MEDICATED_PAD | Freq: Every day | CUTANEOUS | Status: DC
  Administered 2022-11-19 – 2022-11-25 (×5): 6 via TOPICAL

## 2022-11-19 MED ORDER — CINACALCET HCL 30 MG PO TABS
30.0000 mg | ORAL_TABLET | ORAL | Status: DC
  Administered 2022-11-22: 30 mg via ORAL
  Filled 2022-11-19 (×3): qty 1

## 2022-11-19 NOTE — Consult Note (Signed)
ANTICOAGULATION CONSULT NOTE  Pharmacy Consult for heparin infusion Indication: VTE treatment (possible upper extremity DVT)  Allergies  Allergen Reactions   Fish Allergy Swelling   Ciprofloxacin     Unknown reaction   Doxazosin Other (See Comments)    unknown    Patient Measurements: Height: 5\' 9"  (175.3 cm) Weight: 79.4 kg (175 lb) IBW/kg (Calculated) : 70.7 Heparin Dosing Weight: 79.4 kg  Vital Signs: Temp: 98.3 F (36.8 C) (03/31 1204) Temp Source: Oral (03/31 1204) BP: 118/53 (03/31 1204) Pulse Rate: 56 (03/31 1204)  Labs: Recent Labs    11/18/22 1700 11/18/22 1756 11/19/22 0108 11/19/22 0552 11/19/22 1458  HGB 9.8*  --  9.9* 10.3*  --   HCT 31.5*  --   --   --   --   PLT 95*  --   --   --   --   APTT  --  37*  --   --   --   LABPROT  --  16.7*  --   --   --   INR  --  1.4*  --   --   --   HEPARINUNFRC  --   --   --  0.23* 0.79*  CREATININE 9.45*  --   --   --   --      Estimated Creatinine Clearance: 6.2 mL/min (A) (by C-G formula based on SCr of 9.45 mg/dL (H)).   Medical History: Past Medical History:  Diagnosis Date   Anemia    Arthritis    Cancer ([place])    Chronic kidney disease    Diabetes mellitus without complication (HCC)    ESRD (end stage renal disease) ([place])    GERD (gastroesophageal reflux disease)    h/o   Gout    Hyperlipidemia, mixed    Hypertension    Hypokalemia    Renal mass    Secondary hyperparathyroidism of renal origin ([place])    Sleep apnea    h/o no cpap   Thrombocytopenia (HCC)     Medications:  No home anticoagulation per pharmacist review  Assessment: 82 yo male presented to ED with right arm swelling.  Patient has ESRD on hemodialysis with right AV fistula that was placed on 10/26/22.  Pharmacy consulted to start heparin infusion due to concern for upper extremity DVT.  Baseline Labs: hgb 9.8, hct 31.5, plt 95, INR 1.4, aPTT 37  3/31:  0552 HL = 0.23, SUBtherapeutic  1300 >1600 u/hr 3/31   1458  HL=0.79    SUPRAtherapeutic 1600 >1500 u/hr    Goal of Therapy:  Heparin level 0.3-0.7 units/ml Monitor platelets by anticoagulation protocol: Yes   Plan:  3/31   1458  HL=0.79      SUPRAtherapeutic  - Will decrease drip rate to 1500 units/hr - Will recheck HL 8 hrs after rate change CBC daily  Daylynn Stumpp A, PharmD 11/19/2022,3:37 PM

## 2022-11-19 NOTE — Consult Note (Signed)
Reason for Consult: Swollen right arm status post right brachiocephalic fistula with thrombus identified on ultrasound Referring Physician: Dr.Acheampong  [name] [name] is an 82 y.o. male.  HPI: This is a very nice 82 year old gentleman who has renal insufficiency.  He previously was receiving renal replacement therapy via right internal jugular TDC.  He had construction of a right brachiocephalic fistula on 26 October 2022 by Dr. Lucky Cowboy.  He did well, but then noticed that his arm was getting bigger in size.  He came to the emergency room yesterday for evaluation and treatment.  Ultrasound demonstrated patent brachiocephalic fistula, but thrombus within the internal jugular vein that was partially occlusive adjacent to the [facility] in place, subclavian vein appeared to be widely patent, but axillary thrombus likely affecting the outflow tract of the arm and the AV fistula.  I am asked to see him in this context.    He was not on an anticoagulant.  He has chronic thrombocytopenia, no history of malignancy.  Past Medical History:  Diagnosis Date   Anemia    Arthritis    Cancer ([place])    Chronic kidney disease    Diabetes mellitus without complication ([place])    ESRD (end stage renal disease) ([place])    GERD (gastroesophageal reflux disease)    h/o   Gout    Hyperlipidemia, mixed    Hypertension    Hypokalemia    Renal mass    Secondary hyperparathyroidism of renal origin ([place])    Sleep apnea    h/o no cpap   Thrombocytopenia ([place])     Past Surgical History:  Procedure Laterality Date   APPENDECTOMY     AV FISTULA PLACEMENT Right 10/26/2022   Procedure: ARTERIOVENOUS (AV) FISTULA CREATION;  Surgeon: Algernon Huxley, MD;  Location: [facility] ORS;  Service: Vascular;  Laterality: Right;   Bunion Removal Right    CAPD INSERTION N/A 11/23/2021   Procedure: LAPAROSCOPIC INSERTION CONTINUOUS AMBULATORY PERITONEAL DIALYSIS  (CAPD) CATHETER;  Surgeon: Olean Ree, MD;  Location: [facility] OR[facility];  Service: General;   Laterality: N/A;   CAPD REVISION N/A 01/31/2022   Procedure: LAPAROSCOPIC REVISION CONTINUOUS AMBULATORY PERITONEAL DIALYSIS  (CAPD) CATHETER;  Surgeon: Olean Ree, MD;  Location: [facility] OR[facility];  Service: General;  Laterality: N/A;   CAPD REVISION N/A 04/06/2022   Procedure: LAPAROSCOPIC REVISION CONTINUOUS AMBULATORY PERITONEAL DIALYSIS  (CAPD) CATHETER, replacement;  Surgeon: Olean Ree, MD;  Location: [facility];  Service: General;  Laterality: N/A;   CAPD REVISION N/A 04/14/2022   Procedure: LAPAROSCOPIC ASSISTED CONTINUOUS AMBULATORY PERITONEAL DIALYSIS  (CAPD) CATHETER REMOVAL;  Surgeon: Olean Ree, MD;  Location: [facility];  Service: General;  Laterality: N/A;   COLONOSCOPY WITH PROPOFOL N/A 06/14/2015   Procedure: COLONOSCOPY WITH PROPOFOL;  Surgeon: Lollie Sails, MD;  Location: [facility] ENDOSCOPY;  Service: Endoscopy;  Laterality: N/A;   DIALYSIS/PERMA CATHETER INSERTION N/A 10/03/2021   Procedure: DIALYSIS/PERMA CATHETER INSERTION;  Surgeon: Algernon Huxley, MD;  Location: [place] [facility];  Service: Cardiovascular;  Laterality: N/A;   EYE SURGERY     HERNIA REPAIR     abd   INSERTION OF MESH  11/23/2021   Procedure: INSERTION OF MESH;  Surgeon: Olean Ree, MD;  Location: [facility];  Service: General;;   PROSTATE BIOPSY N/A 03/27/2017   Procedure: PROSTATE BIOPSY-URO NAV;  Surgeon: Royston Cowper, MD;  Location: [facility];  Service: Urology;  Laterality: N/A;   PROSTATE BIOPSY N/A 09/11/2019   Procedure: PROSTATE BIOPSY URONAV FUSION;  Surgeon: Royston Cowper,  MD;  Location: [facility];  Service: Urology;  Laterality: N/A;   RETINAL DETACHMENT SURGERY Left    TRANSURETHRAL RESECTION OF PROSTATE     XI ROBOTIC ASSISTED VENTRAL HERNIA N/A 11/23/2021   Procedure: XI ROBOTIC ASSISTED VENTRAL HERNIA, incisional;  Surgeon: Olean Ree, MD;  Location: [facility] OR[facility];  Service: General;  Laterality: N/A;    Family History  Problem Relation Age of Onset   Leukemia Father    Leukemia Brother      Social History:  reports that he has never smoked. He has never used smokeless tobacco. He reports that he does not currently use alcohol. He reports that he does not use drugs.  Allergies:  Allergies  Allergen Reactions   Fish Allergy Swelling   Ciprofloxacin     Unknown reaction   Doxazosin Other (See Comments)    unknown    Medications: I have reviewed the patient's current medications.  Results for orders placed or performed during the hospital encounter of 11/18/22 (from the past 48 hour(s))  Comprehensive metabolic panel     Status: Abnormal   Collection Time: 11/18/22  5:00 PM  Result Value Ref Range   Sodium 136 135 - 145 mmol/L   Potassium 4.3 3.5 - 5.1 mmol/L   Chloride 99 98 - 111 mmol/L   CO2 24 22 - 32 mmol/L   Glucose, Bld 137 (H) 70 - 99 mg/dL    Comment: Glucose reference range applies only to samples taken after fasting for at least 8 hours.   BUN 69 (H) 8 - 23 mg/dL   Creatinine, Ser 9.45 (H) 0.61 - 1.24 mg/dL   Calcium 9.4 8.9 - 10.3 mg/dL   Total Protein 7.2 6.5 - 8.1 g/dL   Albumin 3.4 (L) 3.5 - 5.0 g/dL   AST 11 (L) 15 - 41 U/L   ALT 7 0 - 44 U/L   Alkaline Phosphatase 74 38 - 126 U/L   Total Bilirubin 0.7 0.3 - 1.2 mg/dL   GFR, Estimated 5 (L) >60 mL/min    Comment: (NOTE) Calculated using the CKD-EPI Creatinine Equation (2021)    Anion gap 13 5 - 15    Comment: Performed at [facility], [address] [place], [place] [address]  CBC with Differential     Status: Abnormal   Collection Time: 11/18/22  5:00 PM  Result Value Ref Range   WBC 10.4 4.0 - 10.5 K/uL   RBC 3.26 (L) 4.22 - 5.81 MIL/uL   Hemoglobin 9.8 (L) 13.0 - 17.0 g/dL   HCT 31.5 (L) 39.0 - 52.0 %   MCV 96.6 80.0 - 100.0 fL   MCH 30.1 26.0 - 34.0 pg   MCHC 31.1 30.0 - 36.0 g/dL   RDW 16.1 (H) 11.5 - 15.5 %   Platelets 95 (L) 150 - 400 K/uL    Comment: Immature Platelet Fraction may be clinically indicated, consider ordering this additional test [id number]    nRBC  0.0 0.0 - 0.2 %   Neutrophils Relative % 65 %   Neutro Abs 6.7 1.7 - 7.7 K/uL   Lymphocytes Relative 21 %   Lymphs Abs 2.2 0.7 - 4.0 K/uL   Monocytes Relative 9 %   Monocytes Absolute 1.0 0.1 - 1.0 K/uL   Eosinophils Relative 5 %   Eosinophils Absolute 0.5 0.0 - 0.5 K/uL   Basophils Relative 0 %   Basophils Absolute 0.0 0.0 - 0.1 K/uL   Immature Granulocytes 0 %   Abs Immature Granulocytes 0.04  0.00 - 0.07 K/uL    Comment: Performed at [facility], [address] [place], [place] [address]  Blood culture (routine x 2)     Status: None (Preliminary result)   Collection Time: 11/18/22  5:00 PM   Specimen: BLOOD  Result Value Ref Range   Specimen Description BLOOD BLOOD LEFT ARM    Special Requests      BOTTLES DRAWN AEROBIC AND ANAEROBIC Blood Culture results may not be optimal due to an excessive volume of blood received in culture bottles   Culture      NO GROWTH < 12 HOURS Performed at [facility], [address] [place], [place] [address]    Report Status PENDING   Procalcitonin     Status: None   Collection Time: 11/18/22  5:00 PM  Result Value Ref Range   Procalcitonin 19.94 ng/mL    Comment:        Interpretation: PCT >= 10 ng/mL: Important systemic inflammatory response, almost exclusively due to severe bacterial sepsis or septic shock. (NOTE)       Sepsis PCT Algorithm           Lower Respiratory Tract                                      Infection PCT Algorithm    ----------------------------     ----------------------------         PCT < 0.25 ng/mL                PCT < 0.10 ng/mL          Strongly encourage             Strongly discourage   discontinuation of antibiotics    initiation of antibiotics    ----------------------------     -----------------------------       PCT 0.25 - 0.50 ng/mL            PCT 0.10 - 0.25 ng/mL               OR       >80% decrease in PCT            Discourage initiation of                                             antibiotics      Encourage discontinuation           of antibiotics    ----------------------------     -----------------------------         PCT >= 0.50 ng/mL              PCT 0.26 - 0.50 ng/mL                AND       <80% decrease in PCT             Encourage initiation of                                             antibiotics       Encourage continuation  of antibiotics    ----------------------------     -----------------------------        PCT >= 0.50 ng/mL                  PCT > 0.50 ng/mL               AND         increase in PCT                  Strongly encourage                                      initiation of antibiotics    Strongly encourage escalation           of antibiotics                                     -----------------------------                                           PCT <= 0.25 ng/mL                                                 OR                                        > 80% decrease in PCT                                      Discontinue / Do not initiate                                             antibiotics  Performed at [facility], [address], [place], [place] [address]   Aerobic Culture w Gram Stain (superficial specimen)     Status: None (Preliminary result)   Collection Time: 11/18/22  5:00 PM   Specimen: Wound  Result Value Ref Range   Specimen Description      WOUND Performed at [facility], [address], [place], [place] [address]    Special Requests      HDF Performed at [facility], [place], [place] [address]    Gram Stain      FEW GRAM POSITIVE COCCI IN PAIRS IN CLUSTERS RARE WBC PRESENT, PREDOMINANTLY PMN RARE SQUAMOUS EPITHELIAL CELLS PRESENT Performed at [facility], [address] [place], [place], [place] [address]    Culture PENDING    Report Status PENDING   Protime-INR     Status: Abnormal   Collection Time: 11/18/22  5:56 PM  Result Value  Ref Range   Prothrombin Time 16.7 (H) 11.4 - 15.2 seconds   INR 1.4 (H) 0.8 - 1.2    Comment: (NOTE) INR  goal varies based on device and disease states. Performed at [facility], [address] [place], [place] [address]   APTT     Status: Abnormal   Collection Time: 11/18/22  5:56 PM  Result Value Ref Range   aPTT 37 (H) 24 - 36 seconds    Comment:        IF BASELINE aPTT IS ELEVATED, SUGGEST PATIENT RISK ASSESSMENT BE USED TO DETERMINE APPROPRIATE ANTICOAGULANT THERAPY. Performed at [facility], [address] [place], [place] [address]   CBG monitoring, ED     Status: None   Collection Time: 11/18/22  9:48 PM  Result Value Ref Range   Glucose-Capillary 92 70 - 99 mg/dL    Comment: Glucose reference range applies only to samples taken after fasting for at least 8 hours.  Hemoglobin     Status: Abnormal   Collection Time: 11/19/22  1:08 AM  Result Value Ref Range   Hemoglobin 9.9 (L) 13.0 - 17.0 g/dL    Comment: Performed at [facility], [place], [place] [address]  Heparin level (unfractionated)     Status: Abnormal   Collection Time: 11/19/22  5:52 AM  Result Value Ref Range   Heparin Unfractionated 0.23 (L) 0.30 - 0.70 IU/mL    Comment: (NOTE) The clinical reportable range upper limit is being lowered to >1.10 to align with the FDA approved guidance for the current laboratory assay.  If heparin results are below expected values, and patient dosage has  been confirmed, suggest follow up testing of antithrombin III levels. Performed at [facility], [address] [place], [place] [address]   Hemoglobin     Status: Abnormal   Collection Time: 11/19/22  5:52 AM  Result Value Ref Range   Hemoglobin 10.3 (L) 13.0 - 17.0 g/dL    Comment: Performed at [facility], [address] [place], [place] [address]  Glucose, capillary     Status: None   Collection Time: 11/19/22  7:31 AM  Result Value Ref  Range   Glucose-Capillary 90 70 - 99 mg/dL    Comment: Glucose reference range applies only to samples taken after fasting for at least 8 hours.    [place] Upper Ext Art Right Ltd  Result Date: 11/18/2022 CLINICAL DATA:  Recent right AV fistula, new swelling EXAM: RIGHT UPPER EXTREMITY ARTERIAL DUPLEX SCAN TECHNIQUE: Gray-scale sonography as well as color Doppler and duplex ultrasound was performed to evaluate the arteries of the upper extremity. COMPARISON:  None Available. FINDINGS: Sonographic evaluation of the right upper extremity AV fistula was performed. A radiocephalic right upper extremity AV fistula is identified. Inflow artery: 3.2 mm, 60.69 cm/S Arterial anastomosis: 3.8 mm, 281.32 cm/S Fistula: Proximal: 6.4 mm, 239.91 cm/S Mid: 7.0 mm, 199.93 cm/S Distal: 11.3 mm, 471.25 cm/S Normal color flow and Doppler waveforms are seen within the visualized right radial artery and right cephalic vein. IMPRESSION: 1. Patent right radiocephalic AV fistula as above. Electronically Signed   By: Randa Ngo M.D.   On: 11/18/2022 20:05   US Venous Img Upper Uni Right(DVT)  Result Date: 11/18/2022 CLINICAL DATA:  Arm swelling.  AV fistula right arm. EXAM: Right UPPER EXTREMITY VENOUS DOPPLER ULTRASOUND TECHNIQUE: Gray-scale sonography with graded compression, as well as color Doppler and duplex ultrasound were performed to evaluate the upper extremity deep venous system from the level of the subclavian vein and including the jugular, axillary, basilic, radial, ulnar and upper cephalic vein. Spectral Doppler was utilized  to evaluate flow at rest and with distal augmentation maneuvers. COMPARISON:  None Available. FINDINGS: Contralateral Subclavian Vein: Respiratory phasicity is normal and symmetric with the symptomatic side. No evidence of thrombus. Normal compressibility. Internal Jugular Vein: Nonocclusive thrombus noted in the right internal jugular vein. Subclavian Vein: No evidence of thrombus. Normal  compressibility, respiratory phasicity and response to augmentation. Axillary Vein: Nonocclusive thrombus noted in the axillary vein. Cephalic Vein: No evidence of thrombus. Normal compressibility, respiratory phasicity and response to augmentation. Basilic Vein: No evidence of thrombus. Normal compressibility, respiratory phasicity and response to augmentation. Brachial Veins: No evidence of thrombus. Normal compressibility, respiratory phasicity and response to augmentation. Radial Veins: No evidence of thrombus. Normal compressibility, respiratory phasicity and response to augmentation. Ulnar Veins: No evidence of thrombus. Normal compressibility, respiratory phasicity and response to augmentation. Venous Reflux:  None visualized. Other Findings: Brisk and pulsatile flow in the larger veins related to the presence of the fistula. IMPRESSION: Nonocclusive thrombus noted in the right internal jugular and axillary veins. Electronically Signed   By: Nelson Chimes M.D.   On: 11/18/2022 19:51   DG Chest Portable 1 View  Result Date: 11/18/2022 CLINICAL DATA:  Catheter placement. EXAM: PORTABLE CHEST 1 VIEW COMPARISON:  Chest radiograph dated 03/03/2022. FINDINGS: Right IJ dual-lumen dialysis catheter with tips over central SVC close to the cavoatrial junction. No pneumothorax. No focal consolidation or pleural effusion. The cardiac silhouette is within normal limits. No acute osseous pathology. IMPRESSION: Right IJ dual-lumen dialysis catheter with tips over the central SVC. No pneumothorax. Electronically Signed   By: Anner Crete M.D.   On: 11/18/2022 19:17    Review of Systems Blood pressure (!) 133/57, pulse 68, temperature 97.8 F (36.6 C), temperature source Oral, resp. rate 20, height 5\' 9"  (1.753 m), weight 79.4 kg, SpO2 100 %. Physical Exam  This gentleman is awake, alert, oriented.  Face is symmetric.  There is no exophthalmos or tearing.  There is no facial edema.  He does have right arm edema.   Left arm is normal.  No obvious venous collaterals.  Right IJ TDC is in place.  Hands are warm and well-perfused.  Dressing is over the brachiocephalic fistula site but not too tight, thrill present.  He has pitting edema of the left arm.  No accessory muscle use, no tachypnea.  Catheter appears clean without evidence of infection. Assessment/Plan: 1.  Right axillary thrombus: This appears to be impeding significantly the total volume of blood flow egress out of the right arm, which has augmented considerably since construction of his brachiocephalic fistula.  On heparin drip appropriately, plan for venogram/fistulogram via right arm, with likely aspiration thrombectomy plus minus PTA if indicated.  Not urgent emergent, will plan for Monday.  I explained all this in detail to [name].  He understands this and is in agreement.  Zara Chess 11/19/2022, 7:55 AM

## 2022-11-19 NOTE — Progress Notes (Signed)
  Progress Note   Patient: [name] [name] [id number] DOB: 01/30/1941 DOA: 11/18/2022     1 DOS: the patient was seen and examined on 11/19/2022   Brief hospital course:  82 year old with history of end-stage renal disease on hemodialysis via right IJ dialysis catheter.  Status post recent right brachial cephalic fistula 3 weeks prior.  Admitted on account of worsening right upper extremity swelling.  Ultrasound demonstrated patent fistula but partially occlusive thrombus within right IJ vein.  Currently on heparin drip as appropriate.  Seen and evaluated by vascular team with plans for thrombectomy on Monday.  Assessment and Plan:  Right arm swelling secondary to right IJ vein thrombosis.  Partially occluding.  Patient is status post AV fistula creation on 10/26/2022.  Fistula noted to be patent however.  Seen and evaluated by vascular with plan for thrombectomy in the coming days.  Keep arm elevated.  Patient was empirically covered with antibiotics with concern for possible cellulitis.  End-stage renal disease on hemodialysis: MWF.  Nephrology consulted to continue with hemodialysis maintenance during the course of stay.  Anemia of chronic kidney disease: Stable hemoglobin.  No indication for PRBC transfusion.  Thrombocytopenia: Essential.  Chronic and stable.  Follow-up CBC in AM.  Benign essential hypertension: Blood pressure stable on current regimen.  Will continue with same.  As needed hydralazine  Diabetes mellitus: Fingerstick glucose monitoring as per unit protocol.  Patient is on insulin sliding scale.  Hypertension: Patient is on amlodipine.  Gout: Patient is a [place].  Not in any acute exacerbation at this time.  VTE: Patient is currently on heparin drip  Subjective: Patient with right arm swelling.  Pain with movement.  Not excruciating however.  Denies nausea or vomiting.  Denies fever or chills.  Physical Exam: Vitals:   11/19/22 0401 11/19/22 0522 11/19/22 0759  11/19/22 0945  BP:  (!) 133/57 (!) 117/52   Pulse:  68 60 65  Resp:  20 20   Temp: 97.8 F (36.6 C) 97.8 F (36.6 C) 98.1 F (36.7 C)   TempSrc: Oral Oral    SpO2:  100% 100%   Weight:      Height:       General: Patient is well looking.  Alert oriented x 3.  Laying comfortably in bed. HEENT: Oral mucosa moist.  Neck supple.  No JVD. Chest: Right chest temporary catheter present.  No erythema.  Clinically diminished breath sounds bilaterally.  No added sounds.  Not requiring any oxygen supplementation. CVS: S1-S2 with no murmur.  Abdomen: Soft nontender Extremities: Right upper extremity swelling appreciated. CNS: No focal deficit. Data Reviewed:  Labs have been reviewed.Peritinent labs BUN/CR elevated. [place] report also reviewed.   Family Communication: No family at bedside  Disposition: Status is: Inpatient Remains inpatient appropriate because: Pending thrmbectomy  Planned Discharge Destination: Home  Time spent: 35 minutes  Author: Artist Beach, MD 11/19/2022 10:53 AM  For on call review [email or web address].

## 2022-11-19 NOTE — Progress Notes (Signed)
[facility]  ROUNDING NOTE   Subjective:   [name] was admitted to [facility] on 11/18/2022 for Internal jugular vein thrombosis, right [I82.C11] Internal jugular (IJ) vein thromboembolism, acute, right [I82.C11] Acute deep vein thrombosis (DVT) of axillary vein of right upper extremity [I82.A11]  Last hemodialysis treatment was 3/27. Patient missed his Friday dialysis appointment.   Patient had a right AVF placed in his right arm on 3/7 by Dr. Lucky Cowboy.   Patient presents with increasing edema, pain and heaviness of his right arm. He now notices erythema and proceeded to the emergency department.     Objective:  Vital signs in last 24 hours:  Temp:  [97.8 F (36.6 C)-98.7 F (37.1 C)] 98.3 F (36.8 C) (03/31 1204) Pulse Rate:  [56-83] 56 (03/31 1204) Resp:  [14-20] 19 (03/31 1204) BP: (117-158)/(49-95) 118/53 (03/31 1204) SpO2:  [98 %-100 %] 99 % (03/31 1204) Weight:  [79.3 kg-79.4 kg] 79.4 kg (03/30 1605)  Weight change:  Filed Weights   11/18/22 1605  Weight: 79.4 kg    Intake/Output: I/O last 3 completed shifts: In: 481.1 [P.O.:120; I.V.:164.5; IV Piggyback:196.6] Out: -    Intake/Output this shift:  Total I/O In: 360 [P.O.:360] Out: -   Physical Exam: General: NAD, sitting up in bed  Head: Normocephalic, atraumatic. Moist oral mucosal membranes  Eyes: Anicteric, PERRL  Neck: Supple, trachea midline  Lungs:  Clear to auscultation  Heart: Regular rate and rhythm  Abdomen:  Soft, nontender  Extremities:  Right upper extremity ++ edema.  Neurologic: Nonfocal, moving all four extremities  Skin: No lesions  Access: Right IJ permcath, right arm AVF with edema    Basic Metabolic Panel: Recent Labs  Lab 11/18/22 1700  NA 136  K 4.3  CL 99  CO2 24  GLUCOSE 137*  BUN 69*  CREATININE 9.45*  CALCIUM 9.4    Liver Function Tests: Recent Labs  Lab 11/18/22 1700  AST 11*  ALT 7  ALKPHOS 74  BILITOT 0.7  PROT 7.2  ALBUMIN 3.4*   No  results for input(s): "LIPASE", "AMYLASE" in the last 168 hours. No results for input(s): "AMMONIA" in the last 168 hours.  CBC: Recent Labs  Lab 11/18/22 1700 11/19/22 0108 11/19/22 0552  WBC 10.4  --   --   NEUTROABS 6.7  --   --   HGB 9.8* 9.9* 10.3*  HCT 31.5*  --   --   MCV 96.6  --   --   PLT 95*  --   --     Cardiac Enzymes: No results for input(s): "CKTOTAL", "CKMB", "CKMBINDEX", "TROPONINI" in the last 168 hours.  BNP: Invalid input(s): "POCBNP"  CBG: Recent Labs  Lab 11/18/22 2148 11/19/22 0731 11/19/22 1138  GLUCAP 92 90 77    Microbiology: Results for orders placed or performed during the hospital encounter of 11/18/22  Blood culture (routine x 2)     Status: None (Preliminary result)   Collection Time: 11/18/22  5:00 PM   Specimen: BLOOD  Result Value Ref Range Status   Specimen Description BLOOD BLOOD LEFT ARM  Final   Special Requests   Final    BOTTLES DRAWN AEROBIC AND ANAEROBIC Blood Culture results may not be optimal due to an excessive volume of blood received in culture bottles   Culture   Final    NO GROWTH < 12 HOURS Performed at [facility], [address], [place], [place] [address]    Report Status PENDING  Incomplete  Aerobic Culture w Gram Stain (superficial specimen)     Status: None (Preliminary result)   Collection Time: 11/18/22  5:00 PM   Specimen: Wound  Result Value Ref Range Status   Specimen Description   Final    WOUND Performed at [facility], [address], [place], [place] [address]    Special Requests   Final    HDF Performed at [facility], [address] [place], [place] [address]    Gram Stain   Final    FEW GRAM POSITIVE COCCI IN PAIRS IN CLUSTERS RARE WBC PRESENT, PREDOMINANTLY PMN RARE SQUAMOUS EPITHELIAL CELLS PRESENT    Culture   Final    ABUNDANT STAPHYLOCOCCUS AUREUS SUSCEPTIBILITIES TO FOLLOW Performed at [facility], [place] [address], [place], [place]  [address]    Report Status PENDING  Incomplete    Coagulation Studies: Recent Labs    11/18/22 1756  LABPROT 16.7*  INR 1.4*    Urinalysis: No results for input(s): "COLORURINE", "LABSPEC", "PHURINE", "GLUCOSEU", "HGBUR", "BILIRUBINUR", "KETONESUR", "PROTEINUR", "UROBILINOGEN", "NITRITE", "LEUKOCYTESUR" in the last 72 hours.  Invalid input(s): "APPERANCEUR"    Imaging: [place] Upper Ext Art Right [employer]  Result Date: 11/18/2022 CLINICAL DATA:  Recent right AV fistula, new swelling EXAM: RIGHT UPPER EXTREMITY ARTERIAL DUPLEX SCAN TECHNIQUE: Gray-scale sonography as well as color Doppler and duplex ultrasound was performed to evaluate the arteries of the upper extremity. COMPARISON:  None Available. FINDINGS: Sonographic evaluation of the right upper extremity AV fistula was performed. A radiocephalic right upper extremity AV fistula is identified. Inflow artery: 3.2 mm, 60.69 cm/S Arterial anastomosis: 3.8 mm, 281.32 cm/S Fistula: Proximal: 6.4 mm, 239.91 cm/S Mid: 7.0 mm, 199.93 cm/S Distal: 11.3 mm, 471.25 cm/S Normal color flow and Doppler waveforms are seen within the visualized right radial artery and right cephalic vein. IMPRESSION: 1. Patent right radiocephalic AV fistula as above. Electronically Signed   By: Randa Ngo M.D.   On: 11/18/2022 20:05   US Venous Img Upper Uni Right(DVT)  Result Date: 11/18/2022 CLINICAL DATA:  Arm swelling.  AV fistula right arm. EXAM: Right UPPER EXTREMITY VENOUS DOPPLER ULTRASOUND TECHNIQUE: Gray-scale sonography with graded compression, as well as color Doppler and duplex ultrasound were performed to evaluate the upper extremity deep venous system from the level of the subclavian vein and including the jugular, axillary, basilic, radial, ulnar and upper cephalic vein. Spectral Doppler was utilized to evaluate flow at rest and with distal augmentation maneuvers. COMPARISON:  None Available. FINDINGS: Contralateral Subclavian Vein: Respiratory phasicity is  normal and symmetric with the symptomatic side. No evidence of thrombus. Normal compressibility. Internal Jugular Vein: Nonocclusive thrombus noted in the right internal jugular vein. Subclavian Vein: No evidence of thrombus. Normal compressibility, respiratory phasicity and response to augmentation. Axillary Vein: Nonocclusive thrombus noted in the axillary vein. Cephalic Vein: No evidence of thrombus. Normal compressibility, respiratory phasicity and response to augmentation. Basilic Vein: No evidence of thrombus. Normal compressibility, respiratory phasicity and response to augmentation. Brachial Veins: No evidence of thrombus. Normal compressibility, respiratory phasicity and response to augmentation. Radial Veins: No evidence of thrombus. Normal compressibility, respiratory phasicity and response to augmentation. Ulnar Veins: No evidence of thrombus. Normal compressibility, respiratory phasicity and response to augmentation. Venous Reflux:  None visualized. Other Findings: Brisk and pulsatile flow in the larger veins related to the presence of the fistula. IMPRESSION: Nonocclusive thrombus noted in the right internal jugular and axillary veins. Electronically Signed   By: Nelson Chimes M.D.   On:  11/18/2022 19:51   DG Chest Portable 1 View  Result Date: 11/18/2022 CLINICAL DATA:  Catheter placement. EXAM: PORTABLE CHEST 1 VIEW COMPARISON:  Chest radiograph dated 03/03/2022. FINDINGS: Right IJ dual-lumen dialysis catheter with tips over central SVC close to the cavoatrial junction. No pneumothorax. No focal consolidation or pleural effusion. The cardiac silhouette is within normal limits. No acute osseous pathology. IMPRESSION: Right IJ dual-lumen dialysis catheter with tips over the central SVC. No pneumothorax. Electronically Signed   By: Anner Crete M.D.   On: 11/18/2022 19:17     Medications:    ceFEPime (MAXIPIME) IV Stopped (11/18/22 2257)   heparin 1,600 Units/hr (11/19/22 0943)    vancomycin      amLODipine  10 mg Oral BH-q7a   Chlorhexidine Gluconate Cloth  6 each Topical Daily   insulin aspart  0-5 Units Subcutaneous QHS   insulin aspart  0-6 Units Subcutaneous TID WC   metoprolol tartrate  25 mg Oral BID   pravastatin  20 mg Oral q1800   vancomycin variable dose per unstable renal function (pharmacist dosing)   Does not apply See admin instructions   acetaminophen **OR** acetaminophen, HYDROcodone-acetaminophen, morphine injection, ondansetron **OR** ondansetron (ZOFRAN) IV, vancomycin  Assessment/ Plan:  [name] is a 82 y.o. black male with end stage renal disease on hemodialysis, hypertension, diabetes mellitus type II, diabetic retinopathy, gout, hyperlipidemia, sleep apnea, thrombocytopenia, who is admitted to [facility] on 11/18/2022 for Internal jugular vein thrombosis, right [I82.C11] Internal jugular (IJ) vein thromboembolism, acute, right [I82.C11] Acute deep vein thrombosis (DVT) of axillary vein of right upper extremity [I82.A11]  [facility] MWF [facility] Right IJ permcath 77.5kg  End stage renal disease: MWF schedule. Next dialysis tomorrow via tunneled dialysis catheter. Patient currently above his outpatient dry weight.  Complication of dialysis device with right IJ thrombosis. Placed on heparin gtt. Patient was on aspirin at home. Patient may have cellulitis over his AVF and has been started on vancomycin. Appreciate vascular input.   Hypertension: 118/53. Current regimen of amlodipine and metoprolol. Patient's enalapril is being held.   Anemia of chronic kidney disease: with history of thrombocytopenia and now thrombosis: Hemoglobin remains stable. Platelets are at baseline.   Secondary Hyperparathyroidism: outpatient labs: elevated PTH 702 and hyperphosphatemia: 6.2. Restart patient's cinacalcet and calcitriol.    LOS: 1 Breeanna Galgano 3/31/202412:36 PM

## 2022-11-19 NOTE — Progress Notes (Signed)
Responded to page at 2:30am. Arrived. Medical team actively in room. Informed no needs at this time.

## 2022-11-19 NOTE — Plan of Care (Signed)

## 2022-11-19 NOTE — Consult Note (Signed)
ANTICOAGULATION CONSULT NOTE  Pharmacy Consult for heparin infusion Indication: VTE treatment (possible upper extremity DVT)  Allergies  Allergen Reactions   Fish Allergy Swelling   Ciprofloxacin     Unknown reaction   Doxazosin Other (See Comments)    unknown    Patient Measurements: Height: 5\' 9"  (175.3 cm) Weight: 79.4 kg (175 lb) IBW/kg (Calculated) : 70.7 Heparin Dosing Weight: 79.4 kg  Vital Signs: Temp: 97.8 F (36.6 C) (03/31 0522) Temp Source: Oral (03/31 0522) BP: 133/57 (03/31 0522) Pulse Rate: 68 (03/31 0522)  Labs: Recent Labs    11/18/22 1700 11/18/22 1756 11/19/22 0108 11/19/22 0552  HGB 9.8*  --  9.9* 10.3*  HCT 31.5*  --   --   --   PLT 95*  --   --   --   APTT  --  37*  --   --   LABPROT  --  16.7*  --   --   INR  --  1.4*  --   --   HEPARINUNFRC  --   --   --  0.23*  CREATININE 9.45*  --   --   --      Estimated Creatinine Clearance: 6.2 mL/min (A) (by C-G formula based on SCr of 9.45 mg/dL (H)).   Medical History: Past Medical History:  Diagnosis Date   Anemia    Arthritis    Cancer ([place])    Chronic kidney disease    Diabetes mellitus without complication (HCC)    ESRD (end stage renal disease) ([place])    GERD (gastroesophageal reflux disease)    h/o   Gout    Hyperlipidemia, mixed    Hypertension    Hypokalemia    Renal mass    Secondary hyperparathyroidism of renal origin ([place])    Sleep apnea    h/o no cpap   Thrombocytopenia (HCC)     Medications:  No home anticoagulation per pharmacist review  Assessment: 82 yo male presented to ED with right arm swelling.  Patient has ESRD on hemodialysis with right AV fistula that was placed on 10/26/22.  Pharmacy consulted to start heparin infusion due to concern for upper extremity DVT.  Baseline Labs: hgb 9.8, hct 31.5, plt 95, INR 1.4, aPTT 37 Goal of Therapy:  Heparin level 0.3-0.7 units/ml Monitor platelets by anticoagulation protocol: Yes   Plan:  3/31:  HL @ 0552 = 0.23,  SUBtherapeutic - Will order heparin 1200 units IV X 1 bolus and increase drip rate to 1600 units/hr - Will recheck HL 8 hrs after rate change  Rigoberto Repass D, PharmD 11/19/2022,6:50 AM

## 2022-11-20 ENCOUNTER — Encounter: Payer: Self-pay | Admitting: Oncology

## 2022-11-20 ENCOUNTER — Encounter: Payer: Self-pay | Admitting: Internal Medicine

## 2022-11-20 ENCOUNTER — Other Ambulatory Visit (HOSPITAL_COMMUNITY): Payer: Self-pay

## 2022-11-20 ENCOUNTER — Encounter
Admission: EM | Disposition: A | Discharge status: Home Health | Payer: Self-pay | Source: Home / Self Care | Attending: Internal Medicine

## 2022-11-20 DIAGNOSIS — I82C11 Acute embolism and thrombosis of right internal jugular vein: Secondary | ICD-10-CM | POA: Diagnosis not present

## 2022-11-20 DIAGNOSIS — I871 Compression of vein: Secondary | ICD-10-CM

## 2022-11-20 DIAGNOSIS — Z95828 Presence of other vascular implants and grafts: Secondary | ICD-10-CM

## 2022-11-20 DIAGNOSIS — I82210 Acute embolism and thrombosis of superior vena cava: Secondary | ICD-10-CM

## 2022-11-20 DIAGNOSIS — T8249XA Other complication of vascular dialysis catheter, initial encounter: Secondary | ICD-10-CM

## 2022-11-20 HISTORY — PX: UPPER EXTREMITY VENOGRAPHY: CATH118272

## 2022-11-20 LAB — RENAL FUNCTION PANEL
Albumin: 2.8 g/dL — ABNORMAL LOW (ref 3.5–5.0)
Anion gap: 14 (ref 5–15)
BUN: 82 mg/dL — ABNORMAL HIGH (ref 8–23)
CO2: 23 mmol/L (ref 22–32)
Calcium: 9.4 mg/dL (ref 8.9–10.3)
Chloride: 102 mmol/L (ref 98–111)
Creatinine, Ser: 10.42 mg/dL — ABNORMAL HIGH (ref 0.61–1.24)
GFR, Estimated: 5 mL/min — ABNORMAL LOW (ref >60)
Glucose, Bld: 108 mg/dL — ABNORMAL HIGH (ref 70–99)
Phosphorus: 6.4 mg/dL — ABNORMAL HIGH (ref 2.5–4.6)
Potassium: 5.1 mmol/L (ref 3.5–5.1)
Sodium: 139 mmol/L (ref 135–145)

## 2022-11-20 LAB — CBC
HCT: 31 % — ABNORMAL LOW (ref 39.0–52.0)
HCT: 32.4 % — ABNORMAL LOW (ref 39.0–52.0)
Hemoglobin: 10.2 g/dL — ABNORMAL LOW (ref 13.0–17.0)
Hemoglobin: 9.9 g/dL — ABNORMAL LOW (ref 13.0–17.0)
MCH: 30.1 pg (ref 26.0–34.0)
MCH: 30.4 pg (ref 26.0–34.0)
MCHC: 31.5 g/dL (ref 30.0–36.0)
MCHC: 31.9 g/dL (ref 30.0–36.0)
MCV: 94.2 fL (ref 80.0–100.0)
MCV: 96.4 fL (ref 80.0–100.0)
Platelets: 121 10*3/uL — ABNORMAL LOW (ref 150–400)
Platelets: 124 10*3/uL — ABNORMAL LOW (ref 150–400)
RBC: 3.29 MIL/uL — ABNORMAL LOW (ref 4.22–5.81)
RBC: 3.36 MIL/uL — ABNORMAL LOW (ref 4.22–5.81)
RDW: 15.8 % — ABNORMAL HIGH (ref 11.5–15.5)
RDW: 15.9 % — ABNORMAL HIGH (ref 11.5–15.5)
WBC: 7.9 10*3/uL (ref 4.0–10.5)
WBC: 8.1 10*3/uL (ref 4.0–10.5)
nRBC: 0 % (ref 0.0–0.2)
nRBC: 0 % (ref 0.0–0.2)

## 2022-11-20 LAB — HEPATITIS B SURFACE ANTIGEN: Hepatitis B Surface Ag: NONREACTIVE

## 2022-11-20 LAB — GLUCOSE, CAPILLARY
Glucose-Capillary: 96 mg/dL (ref 70–99)
Glucose-Capillary: 101 mg/dL — ABNORMAL HIGH (ref 70–99)
Glucose-Capillary: 67 mg/dL — ABNORMAL LOW (ref 70–99)

## 2022-11-20 LAB — AEROBIC CULTURE W GRAM STAIN (SUPERFICIAL SPECIMEN)

## 2022-11-20 LAB — HEPARIN LEVEL (UNFRACTIONATED)
Heparin Unfractionated: 0.61 IU/mL (ref 0.30–0.70)
Heparin Unfractionated: 0.67 IU/mL (ref 0.30–0.70)

## 2022-11-20 LAB — HEMOGLOBIN A1C
Hgb A1c MFr Bld: 5.6 % (ref 4.8–5.6)
Mean Plasma Glucose: 114 mg/dL

## 2022-11-20 SURGERY — UPPER EXTREMITY VENOGRAPHY
Anesthesia: Moderate Sedation | Laterality: Right

## 2022-11-20 MED ORDER — MIDAZOLAM HCL 2 MG/2ML IJ SOLN
INTRAMUSCULAR | Status: AC
  Filled 2022-11-20: qty 2

## 2022-11-20 MED ORDER — MIDAZOLAM HCL 2 MG/2ML IJ SOLN
INTRAMUSCULAR | Status: DC | PRN
  Administered 2022-11-20 (×2): 1 mg via INTRAVENOUS

## 2022-11-20 MED ORDER — HEPARIN SODIUM (PORCINE) 1000 UNIT/ML IJ SOLN
INTRAMUSCULAR | Status: AC
  Filled 2022-11-20: qty 10

## 2022-11-20 MED ORDER — IODIXANOL 320 MG/ML IV SOLN
INTRAVENOUS | Status: DC | PRN
  Administered 2022-11-20: 15 mL

## 2022-11-20 MED ORDER — FENTANYL CITRATE (PF) 100 MCG/2ML IJ SOLN
INTRAMUSCULAR | Status: AC
  Filled 2022-11-20: qty 2

## 2022-11-20 MED ORDER — ACETAMINOPHEN 325 MG PO TABS
ORAL_TABLET | ORAL | Status: AC
  Filled 2022-11-20: qty 2

## 2022-11-20 MED ORDER — DEXTROSE 50 % IV SOLN
INTRAVENOUS | Status: AC
  Administered 2022-11-20: 0.5
  Filled 2022-11-20: qty 50

## 2022-11-20 MED ORDER — FENTANYL CITRATE (PF) 100 MCG/2ML IJ SOLN
INTRAMUSCULAR | Status: DC | PRN
  Administered 2022-11-20 (×2): 50 ug via INTRAVENOUS

## 2022-11-20 MED ORDER — HALOPERIDOL LACTATE 5 MG/ML IJ SOLN
5.0000 mg | Freq: Once | INTRAMUSCULAR | Status: AC
  Administered 2022-11-20: 5 mg via INTRAVENOUS
  Filled 2022-11-20: qty 1

## 2022-11-20 SURGICAL SUPPLY — 9 items
CANNULA 5F STIFF (CANNULA) IMPLANT
CATH BEACON 5 .035 40 KMP TP (CATHETERS) IMPLANT
CATH BEACON 5 .038 40 KMP TP (CATHETERS) ×1
COVER PROBE ULTRASOUND 5X96 (MISCELLANEOUS) IMPLANT
DRAPE BRACHIAL (DRAPES) IMPLANT
GLIDEWIRE ADV .035X180CM (WIRE) IMPLANT
PACK ANGIOGRAPHY (CUSTOM PROCEDURE TRAY) IMPLANT
SHEATH BRITE TIP 5FRX11 (SHEATH) IMPLANT
SUT MNCRL AB 4-0 PS2 18 (SUTURE) IMPLANT

## 2022-11-20 NOTE — Progress Notes (Signed)
Dr. Mal Misty notified of 1/2 amp glucose given IV for blood sugar of 67. Patient getting ready to be picked up for vascular procedure. Pt has been NPO all day.

## 2022-11-20 NOTE — Progress Notes (Signed)
       CROSS COVER NOTE  NAME: [name] [name] MRN: [id number] DOB : 04-Aug-1941 ATTENDING PHYSICIAN: Jennye Boroughs, MD    Date of Service   11/20/2022   HPI/Events of Note   Report/Request *** On Review of chart *** Bedside eval*** HPI***  Interventions   Assessment/Plan:  Delirium 5 mg IV Haldol Safety Sitter Delirium precautions - Minimize/avoid deliriogenic meds including: anticholinergic, opiates, benzodiazepines           - Maintain hydration, oxygenation, nutrition           - Limit use of restraints and catheters           - Normalize sleep patterns by minimizing nighttime noise, light and interruptions by                -Ensure sleep apnea treatment is provided overnight.             clustering care, opening blinds during the day           - Reorient the patient frequently, provide easily visible clock and calendar           - Provide sensory aids like glasses, hearing aids           - Encourage ambulation, regular activities and visitors to maintain cognitive stimulation              -Patient would benefit from having family members at bedside to reinforce his orientation  X    *** professional thanks      To reach the provider On-Call:   7AM- 7PM see care teams to locate the attending and reach out to them via [email or web address] Password: [email or web address] 7PM-7AM contact night-coverage If you still have difficulty reaching the appropriate provider, please page the [facility] (Director on Call) for [facility] on amion for assistance  This document was prepared using Systems analyst and may include unintentional dictation errors.  Neomia Glass DNP, MBA, FNP-BC, PMHNP-BC Nurse Practitioner [facility] Pager [phone]

## 2022-11-20 NOTE — Progress Notes (Signed)
[facility]  ROUNDING NOTE   Subjective:   [name] was admitted to [facility] on 11/18/2022 for Internal jugular vein thrombosis, right [I82.C11] Internal jugular (IJ) vein thromboembolism, acute, right [I82.C11] Acute deep vein thrombosis (DVT) of axillary vein of right upper extremity [I82.A11]  Patient seen and evaluated during dialysis   HEMODIALYSIS FLOWSHEET:  Blood Flow Rate (mL/min): 400 mL/min Arterial Pressure (mmHg): -180 mmHg Venous Pressure (mmHg): 160 mmHg TMP (mmHg): 1 mmHg Ultrafiltration Rate (mL/min): 493 mL/min Dialysate Flow Rate (mL/min): 300 ml/min Dialysis Fluid Bolus: Normal Saline  States he feels better since admission Denies shortness of breath   Objective:  Vital signs in last 24 hours:  Temp:  [97.6 F (36.4 C)-98.7 F (37.1 C)] 97.6 F (36.4 C) (04/01 1320) Pulse Rate:  [53-69] 64 (04/01 1320) Resp:  [10-21] 13 (04/01 1320) BP: (113-129)/(50-66) 129/53 (04/01 1320) SpO2:  [95 %-100 %] 100 % (04/01 1320) Weight:  [77.1 kg-78.1 kg] 77.1 kg (04/01 1320)  Weight change:  Filed Weights   11/18/22 1605 11/20/22 0935 11/20/22 1320  Weight: 79.4 kg 78.1 kg 77.1 kg    Intake/Output: I/O last 3 completed shifts: In: 942.2 [P.O.:680; I.V.:164.5; IV Piggyback:97.7] Out: 350 [Urine:350]   Intake/Output this shift:  Total I/O In: -  Out: 1500 [Other:1500]  Physical Exam: General: NAD  Head: Normocephalic, atraumatic. Moist oral mucosal membranes  Eyes: Anicteric  Lungs:  Clear to auscultation  Heart: Regular rate and rhythm  Abdomen:  Soft, nontender  Extremities:  Right upper extremity ++ edema.  Neurologic: Alert and oriented, moving all four extremities  Skin: No lesions  Access: Right IJ permcath, right arm AVF with edema    Basic Metabolic Panel: Recent Labs  Lab 11/18/22 1700 11/20/22 0813  NA 136 139  K 4.3 5.1  CL 99 102  CO2 24 23  GLUCOSE 137* 108*  BUN 69* 82*  CREATININE 9.45* 10.42*  CALCIUM  9.4 9.4  PHOS  --  6.4*     Liver Function Tests: Recent Labs  Lab 11/18/22 1700 11/20/22 0813  AST 11*  --   ALT 7  --   ALKPHOS 74  --   BILITOT 0.7  --   PROT 7.2  --   ALBUMIN 3.4* 2.8*    No results for input(s): "LIPASE", "AMYLASE" in the last 168 hours. No results for input(s): "AMMONIA" in the last 168 hours.  CBC: Recent Labs  Lab 11/18/22 1700 11/19/22 0108 11/19/22 0552 11/19/22 2359 11/20/22 0813  WBC 10.4  --   --  8.1 7.9  NEUTROABS 6.7  --   --   --   --   HGB 9.8* 9.9* 10.3* 9.9* 10.2*  HCT 31.5*  --   --  31.0* 32.4*  MCV 96.6  --   --  94.2 96.4  PLT 95*  --   --  121* 124*     Cardiac Enzymes: No results for input(s): "CKTOTAL", "CKMB", "CKMBINDEX", "TROPONINI" in the last 168 hours.  BNP: Invalid input(s): "POCBNP"  CBG: Recent Labs  Lab 11/19/22 0731 11/19/22 1138 11/19/22 1558 11/19/22 2100 11/20/22 0835  GLUCAP 90 77 128* 147* 96     Microbiology: Results for orders placed or performed during the hospital encounter of 11/18/22  Blood culture (routine x 2)     Status: None (Preliminary result)   Collection Time: 11/18/22  5:00 PM   Specimen: BLOOD  Result Value Ref Range Status   Specimen Description BLOOD BLOOD LEFT ARM  Final   Special Requests   Final    BOTTLES DRAWN AEROBIC AND ANAEROBIC Blood Culture results may not be optimal due to an excessive volume of blood received in culture bottles   Culture   Final    NO GROWTH 2 DAYS Performed at [facility], [address] [place], [place] [address]    Report Status PENDING  Incomplete  Aerobic Culture w Gram Stain (superficial specimen)     Status: None   Collection Time: 11/18/22  5:00 PM   Specimen: Wound  Result Value Ref Range Status   Specimen Description   Final    WOUND Performed at [facility], [address], [place], [place] [address]    Special Requests   Final    HDF Performed at [facility], [address]  [place], [place] [address]    Gram Stain   Final    FEW GRAM POSITIVE COCCI IN PAIRS IN CLUSTERS RARE WBC PRESENT, PREDOMINANTLY PMN RARE SQUAMOUS EPITHELIAL CELLS PRESENT Performed at [facility], [place] [address], [place], [place] [address]    Culture   Final    ABUNDANT METHICILLIN RESISTANT STAPHYLOCOCCUS AUREUS   Report Status 11/20/2022 FINAL  Final   Organism ID, Bacteria METHICILLIN RESISTANT STAPHYLOCOCCUS AUREUS  Final      Susceptibility   Methicillin resistant staphylococcus aureus - MIC*    CIPROFLOXACIN >=8 RESISTANT Resistant     ERYTHROMYCIN >=8 RESISTANT Resistant     GENTAMICIN <=0.5 SENSITIVE Sensitive     OXACILLIN >=4 RESISTANT Resistant     TETRACYCLINE <=1 SENSITIVE Sensitive     VANCOMYCIN <=0.5 SENSITIVE Sensitive     TRIMETH/SULFA <=10 SENSITIVE Sensitive     CLINDAMYCIN <=0.25 SENSITIVE Sensitive     RIFAMPIN <=0.5 SENSITIVE Sensitive     Inducible Clindamycin NEGATIVE Sensitive     * ABUNDANT METHICILLIN RESISTANT STAPHYLOCOCCUS AUREUS  Blood culture (routine x 2)     Status: None (Preliminary result)   Collection Time: 11/19/22  5:52 AM   Specimen: BLOOD  Result Value Ref Range Status   Specimen Description BLOOD BLOOD LEFT ARM  Final   Special Requests   Final    BOTTLES DRAWN AEROBIC AND ANAEROBIC Blood Culture adequate volume   Culture   Final    NO GROWTH 1 DAY Performed at [facility], [address] [place], [place] [address]    Report Status PENDING  Incomplete    Coagulation Studies: Recent Labs    11/18/22 1756  LABPROT 16.7*  INR 1.4*     Urinalysis: No results for input(s): "COLORURINE", "LABSPEC", "PHURINE", "GLUCOSEU", "HGBUR", "BILIRUBINUR", "KETONESUR", "PROTEINUR", "UROBILINOGEN", "NITRITE", "LEUKOCYTESUR" in the last 72 hours.  Invalid input(s): "APPERANCEUR"    Imaging: [place] Upper Ext Art Right [employer]  Result Date: 11/18/2022 CLINICAL DATA:  Recent right AV fistula, new swelling EXAM: RIGHT UPPER EXTREMITY  ARTERIAL DUPLEX SCAN TECHNIQUE: Gray-scale sonography as well as color Doppler and duplex ultrasound was performed to evaluate the arteries of the upper extremity. COMPARISON:  None Available. FINDINGS: Sonographic evaluation of the right upper extremity AV fistula was performed. A radiocephalic right upper extremity AV fistula is identified. Inflow artery: 3.2 mm, 60.69 cm/S Arterial anastomosis: 3.8 mm, 281.32 cm/S Fistula: Proximal: 6.4 mm, 239.91 cm/S Mid: 7.0 mm, 199.93 cm/S Distal: 11.3 mm, 471.25 cm/S Normal color flow and Doppler waveforms are seen within the visualized right radial artery and right cephalic vein. IMPRESSION: 1. Patent right radiocephalic AV fistula as above. Electronically  Signed   By: Randa Ngo M.D.   On: 11/18/2022 20:05   US Venous Img Upper Uni Right(DVT)  Result Date: 11/18/2022 CLINICAL DATA:  Arm swelling.  AV fistula right arm. EXAM: Right UPPER EXTREMITY VENOUS DOPPLER ULTRASOUND TECHNIQUE: Gray-scale sonography with graded compression, as well as color Doppler and duplex ultrasound were performed to evaluate the upper extremity deep venous system from the level of the subclavian vein and including the jugular, axillary, basilic, radial, ulnar and upper cephalic vein. Spectral Doppler was utilized to evaluate flow at rest and with distal augmentation maneuvers. COMPARISON:  None Available. FINDINGS: Contralateral Subclavian Vein: Respiratory phasicity is normal and symmetric with the symptomatic side. No evidence of thrombus. Normal compressibility. Internal Jugular Vein: Nonocclusive thrombus noted in the right internal jugular vein. Subclavian Vein: No evidence of thrombus. Normal compressibility, respiratory phasicity and response to augmentation. Axillary Vein: Nonocclusive thrombus noted in the axillary vein. Cephalic Vein: No evidence of thrombus. Normal compressibility, respiratory phasicity and response to augmentation. Basilic Vein: No evidence of thrombus. Normal  compressibility, respiratory phasicity and response to augmentation. Brachial Veins: No evidence of thrombus. Normal compressibility, respiratory phasicity and response to augmentation. Radial Veins: No evidence of thrombus. Normal compressibility, respiratory phasicity and response to augmentation. Ulnar Veins: No evidence of thrombus. Normal compressibility, respiratory phasicity and response to augmentation. Venous Reflux:  None visualized. Other Findings: Brisk and pulsatile flow in the larger veins related to the presence of the fistula. IMPRESSION: Nonocclusive thrombus noted in the right internal jugular and axillary veins. Electronically Signed   By: Nelson Chimes M.D.   On: 11/18/2022 19:51   DG Chest Portable 1 View  Result Date: 11/18/2022 CLINICAL DATA:  Catheter placement. EXAM: PORTABLE CHEST 1 VIEW COMPARISON:  Chest radiograph dated 03/03/2022. FINDINGS: Right IJ dual-lumen dialysis catheter with tips over central SVC close to the cavoatrial junction. No pneumothorax. No focal consolidation or pleural effusion. The cardiac silhouette is within normal limits. No acute osseous pathology. IMPRESSION: Right IJ dual-lumen dialysis catheter with tips over the central SVC. No pneumothorax. Electronically Signed   By: Anner Crete M.D.   On: 11/18/2022 19:17     Medications:    ceFEPime (MAXIPIME) IV 1 g (11/19/22 2333)   heparin 1,500 Units/hr (11/20/22 0133)   vancomycin Stopped (11/20/22 1308)    amLODipine  10 mg Oral BH-q7a   calcitRIOL  0.75 mcg Oral Q M,W,F   Chlorhexidine Gluconate Cloth  6 each Topical Daily   cinacalcet  30 mg Oral Q M,W,F   heparin sodium (porcine)       insulin aspart  0-5 Units Subcutaneous QHS   insulin aspart  0-6 Units Subcutaneous TID WC   metoprolol tartrate  25 mg Oral BID   pravastatin  20 mg Oral q1800   acetaminophen **OR** acetaminophen, heparin sodium (porcine), HYDROcodone-acetaminophen, morphine injection, ondansetron **OR** ondansetron  (ZOFRAN) IV  Assessment/ Plan:  [name] is a 82 y.o. black male with end stage renal disease on hemodialysis, hypertension, diabetes mellitus type II, diabetic retinopathy, gout, hyperlipidemia, sleep apnea, thrombocytopenia, who is admitted to [facility] on 11/18/2022 for Internal jugular vein thrombosis, right [I82.C11] Internal jugular (IJ) vein thromboembolism, acute, right [I82.C11] Acute deep vein thrombosis (DVT) of axillary vein of right upper extremity [I82.A11]  [facility] MWF [facility] Right IJ permcath 77.5kg  End stage renal disease: MWF schedule. Patient received dialysis today utilizing right chest PermCath.  Patient scheduled for vascular procedure later this afternoon.  UF goal reduced from 2.5  L to 1.5 L due to predialysis weight close to EDW.  Next treatment scheduled for Wednesday.  Complication of dialysis device with right IJ thrombosis. Placed on heparin gtt. Patient was on aspirin at home.  Vascular surgery scheduled to evaluate aVF later today.  Hypertension with chronic kidney disease:  Current regimen of amlodipine and metoprolol. Patient's enalapril is being held.  Blood pressure 119/56 during dialysis.  Anemia of chronic kidney disease: with history of thrombocytopenia and now thrombosis: Hemoglobin and platelets stable for this patient.  Secondary Hyperparathyroidism: outpatient labs: elevated PTH 702 and hyperphosphatemia.  Continue patient's cinacalcet and calcitriol.    LOS: 2 [place] 4/1/20242:00 PM

## 2022-11-20 NOTE — Progress Notes (Signed)
Hemodialysis note  Received patient in bed to unit. Alert and oriented.  Informed consent signed and in chart.  Treatment initiated: 0945 Treatment completed: 1318  Patient tolerated well. Transported back to room, alert without acute distress.  Report given to patient's RN.   Access used: Right HD catheter Access issues: none  Total UF removed: 1.5L Medication(s) given: Tylenol and Vancomycin   Arizona Constable RN Kidney Dialysis Unit

## 2022-11-20 NOTE — Interval H&P Note (Signed)
History and Physical Interval Note:  11/20/2022 5:07 PM  [name] [name]  has presented today for surgery, with the diagnosis of ESRD.  The various methods of treatment have been discussed with the patient and family. After consideration of risks, benefits and other options for treatment, the patient has consented to  Procedure(s): UPPER EXTREMITY VENOGRAPHY (Right) as a surgical intervention.  The patient's history has been reviewed, patient examined, no change in status, stable for surgery.  I have reviewed the patient's chart and labs.  Questions were answered to the patient's satisfaction.     [name]

## 2022-11-20 NOTE — Discharge Planning (Signed)
[name] [name], [place]. [address] [phone]  Scheduled days: Monday Wednesday and Friday Treatment time: 9:45am  Above schedule confirmed with clinic.

## 2022-11-20 NOTE — Op Note (Signed)
[facility]   OPERATIVE NOTE  DATE: 11/20/2022  PRE-OPERATIVE DIAGNOSIS: 1. ESRD 2. Central venous stenosis/occlusion/thrombosis  POST-OPERATIVE DIAGNOSIS: same with occlusion of the SVC around his PermCath  PROCEDURE: 1.   Ultrasound guidance for vascular access to right brachial vein 2.   Catheter placement into right innominate vein from right brachial approach 3.   Right upper extremity venogram and superior venacavogram   SURGEON: [name], MD  ASSISTANT(S): None  ANESTHESIA: Local with moderate conscious sedation for approximately 26 minutes using 2 mg of Versed and 50 mcg of Fentanyl  ESTIMATED BLOOD LOSS: 3 cc  FINDING(S): 1.  There is no thrombus in the right brachial, axillary, subclavian, or innominate veins.  These veins were relatively normal down to the innominate vein and then there was an occlusion of the superior vena cava and most proximal innominate vein around his existing PermCath.    SPECIMEN(S):  None  INDICATIONS:   Patient is a 82 y.o.male who presents with a difficult situation.  His right arm is markedly swollen and an ultrasound reports nonocclusive thrombus in the jugular and axillary veins.  He had a right brachiocephalic AV fistula placed about 3 to 4 weeks ago which is patent and flowing well.  He has had a right jugular PermCath in for well over a year.  We are performing a venogram for further evaluation and potential treatment of the central venous stenosis or thrombosis. Risks and benefits were discussed and informed consent was obtained.   DESCRIPTION: After obtaining full informed written consent, the patient was brought back to the vascular suite and placed in supine position with their arms extended bilaterally. Moderate conscious sedation was administered during a face to face encounter with the patient throughout the procedure with my supervision of the RN administering medicines and monitoring the patient's vital  signs, pulse oximetry, telemetry and mental status throughout from the start of the procedure until the patient was taken to the recovery room.  After obtaining adequate anesthesia, the patient was prepped and draped in the standard fashion in both upper extremities. I started on the right side. The right brachial vein was identified with ultrasound and found to be patent. It was then accessed under direct ultrasound guidance without difficulty with a micropuncture needle and a permanent image was recorded. A micropuncture wire and sheath were then placed. Imaging initially was performed through the micropuncture sheath which showed the right brachial and axillary veins to be large and patent.  I then upsized to a regular 5 French sheath and placed a Kumpe catheter in the right subclavian and innominate veins.  The right subclavian vein was patent.  The innominate vein was patent initially but then occluded in the most central section and then the superior vena cava was occluded over about a 2 to 3 cm segment around the catheter.  Attempts to cross this occlusion were unsuccessful with a Kumpe catheter and advantage wire and it was clear we would have to traverse and remove the catheter to treat the central venous occlusion.  As he was still using this for dialysis, this was not done today.  Discussions will be made with the nephrology team over removing this and placing a femoral PermCath over the Unasyn for another week or 2 and then trying to use the fistula for dialysis.  At this point, I elected to terminate the procedure. The sheath was removed and pressure were held at the access site in the upper extremities. The patient  was awakened from anesthesia and taken to the recovery room in stable condition.  COMPLICATIONS: None  CONDITION: Stable

## 2022-11-20 NOTE — Consult Note (Signed)
ANTICOAGULATION CONSULT NOTE  Pharmacy Consult for heparin infusion Indication: VTE treatment (possible upper extremity DVT)  Allergies  Allergen Reactions   Fish Allergy Swelling   Ciprofloxacin     Unknown reaction   Doxazosin Other (See Comments)    unknown    Patient Measurements: Height: 5\' 9"  (175.3 cm) Weight: 79.4 kg (175 lb) IBW/kg (Calculated) : 70.7 Heparin Dosing Weight: 79.4 kg  Vital Signs: Temp: 98.4 F (36.9 C) (03/31 2335) BP: 113/52 (03/31 2335) Pulse Rate: 59 (03/31 2335)  Labs: Recent Labs    11/18/22 1700 11/18/22 1756 11/19/22 0108 11/19/22 0552 11/19/22 1458 11/19/22 2359  HGB 9.8*  --  9.9* 10.3*  --   --   HCT 31.5*  --   --   --   --   --   PLT 95*  --   --   --   --   --   APTT  --  37*  --   --   --   --   LABPROT  --  16.7*  --   --   --   --   INR  --  1.4*  --   --   --   --   HEPARINUNFRC  --   --   --  0.23* 0.79* 0.67  CREATININE 9.45*  --   --   --   --   --      Estimated Creatinine Clearance: 6.2 mL/min (A) (by C-G formula based on SCr of 9.45 mg/dL (H)).   Medical History: Past Medical History:  Diagnosis Date   Anemia    Arthritis    Cancer ([place])    Chronic kidney disease    Diabetes mellitus without complication (HCC)    ESRD (end stage renal disease) ([place])    GERD (gastroesophageal reflux disease)    h/o   Gout    Hyperlipidemia, mixed    Hypertension    Hypokalemia    Renal mass    Secondary hyperparathyroidism of renal origin ([place])    Sleep apnea    h/o no cpap   Thrombocytopenia (HCC)     Medications:  No home anticoagulation per pharmacist review  Assessment: 82 yo male presented to ED with right arm swelling.  Patient has ESRD on hemodialysis with right AV fistula that was placed on 10/26/22.  Pharmacy consulted to start heparin infusion due to concern for upper extremity DVT.  Baseline Labs: hgb 9.8, hct 31.5, plt 95, INR 1.4, aPTT 37  3/31:  0552 HL = 0.23, SUBtherapeutic  1300 >1600  u/hr 3/31   1458  HL=0.79   SUPRAtherapeutic 1600 >1500 u/hr 3/31   2359  HL = 0.67  Therapeutic X 1 @ 1500 un/hr    Goal of Therapy:  Heparin level 0.3-0.7 units/ml Monitor platelets by anticoagulation protocol: Yes   Plan:  3/31:  HL @ [id number] = 0.67, therapeutic X 1 - Will continue pt on current rate and recheck HL on 04/01 @ 0800 CBC daily  Ioan Landini D, PharmD 11/20/2022,12:28 AM

## 2022-11-20 NOTE — Progress Notes (Signed)
Progress Note    [name] [name]  [id number] DOB: 05-Sep-1940  DOA: 11/18/2022 PCP: Dion Body, MD      Brief Narrative:    Medical records reviewed and are as summarized below:  [name] [name] is a 82 y.o. 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  He presented to the hospital for swelling and pain in the right upper extremity extending from the fingers to the mid upper arm.      Assessment/Plan:   Principal Problem:   Internal jugular (IJ) vein thromboembolism, acute, right Active Problems:   Postoperative complication of skin involving drainage from surgical wound from right arm AVF placement   Cellulitis of right arm   S/P arteriovenous (AV) fistula creation 10/26/22   Acute on chronic anemia   ESRD (end stage renal disease) on dialysis   Anemia in chronic kidney disease   Essential thrombocytopenia   Benign essential hypertension   Diabetes mellitus, type II   Gout    Body mass index is 25.84 kg/m.   Acute right internal jugular vein and axillary vein thrombosis: Continue IV heparin drip and monitor heparin level per protocol.  Analgesics as needed for pain.  Plan for thrombectomy by vascular surgeon today.   Right antecubital fossa wound infection from right AV fistula site: Wound culture is growing MRSA.  Discontinue IV cefepime and continue IV vancomycin.     ESRD: Follow-up with nephrologist for hemodialysis   Other comorbidities include hypertension, gout, thrombocytopenia, anemia of chronic disease, type II DM   Diet Order             Diet NPO time specified  Diet effective midnight                            Consultants: Vascular surgery Nephrologist  Procedures: Plan for right IJ  thrombectomy    Medications:    amLODipine  10 mg Oral BH-q7a   calcitRIOL  0.75 mcg Oral Q M,W,F   Chlorhexidine Gluconate Cloth  6 each Topical Daily   cinacalcet  30 mg Oral Q M,W,F   insulin aspart  0-5 Units Subcutaneous QHS   insulin aspart  0-6 Units Subcutaneous TID WC   metoprolol tartrate  25 mg Oral BID   pravastatin  20 mg Oral q1800   Continuous Infusions:  ceFEPime (MAXIPIME) IV 1 g (11/19/22 2333)   heparin 1,500 Units/hr (11/20/22 0133)   vancomycin     vancomycin       Anti-infectives (From admission, onward)    Start     Dose/Rate Route Frequency Ordered Stop   11/20/22 1200  vancomycin (VANCOREADY) IVPB 750 mg/150 mL        750 mg 150 mL/hr over 60 Minutes Intravenous Every M-W-F (Hemodialysis) 11/19/22 1406     11/19/22 1206  vancomycin variable dose per unstable renal function (pharmacist dosing)  Status:  Discontinued         Does not apply See admin instructions 11/19/22 1206 11/19/22 1406   11/19/22 0800  vancomycin (VANCOREADY) IVPB 750 mg/150 mL        750 mg 150 mL/hr over 60 Minutes Intravenous Every Dialysis 11/18/22 2147     11/18/22 2300  ceFEPIme (MAXIPIME) 1 g in sodium chloride 0.9 % 100 mL IVPB  1 g 200 mL/hr over 30 Minutes Intravenous Every 24 hours 11/18/22 2136     11/18/22 1645  vancomycin (VANCOREADY) IVPB 1750 mg/350 mL        1,750 mg 175 mL/hr over 120 Minutes Intravenous  Once 11/18/22 1641 11/18/22 2013   11/18/22 1645  cefTRIAXone (ROCEPHIN) 2 g in sodium chloride 0.9 % 100 mL IVPB        2 g 200 mL/hr over 30 Minutes Intravenous  Once 11/18/22 1641 11/18/22 1850              Family Communication/Anticipated D/C date and plan/Code Status   DVT prophylaxis:      Code Status: Full Code  Family Communication: None Disposition Plan: Plan to discharge home in 3 to 4 days   Status is: Inpatient Remains inpatient appropriate because: Right upper extremity DVT       Subjective:   Interval events  noted.  He complains of pain and swelling in the right arm  Objective:    Vitals:   11/19/22 1613 11/19/22 1945 11/19/22 2335 11/20/22 0834  BP: (!) 123/50 (!) 116/51 (!) 113/52 (!) 121/57  Pulse: (!) 59 69 (!) 59 61  Resp: 18 18 17 18   Temp: 98 F (36.7 C) 98.7 F (37.1 C) 98.4 F (36.9 C) 98.1 F (36.7 C)  TempSrc:      SpO2: 100% 100% 98% 100%  Weight:      Height:       No data found.   Intake/Output Summary (Last 24 hours) at 11/20/2022 0902 Last data filed at 11/19/2022 2134 Gross per 24 hour  Intake 560 ml  Output 350 ml  Net 210 ml   Filed Weights   11/18/22 1605  Weight: 79.4 kg    Exam:  GEN: NAD SKIN: No rash. Permcath on right upper chest EYES: No pallor or icterus ENT: MMM CV: RRR PULM: CTA B ABD: soft, ND, NT, +BS CNS: AAO x 3, non focal EXT: Right upper extremity swelling and tenderness. Purulent drainage from AV fistula wound on the right antecubital area       Data Reviewed:   I have personally reviewed following labs and imaging studies:  Labs: Labs show the following:   Basic Metabolic Panel: Recent Labs  Lab 11/18/22 1700 11/20/22 0813  NA 136 139  K 4.3 5.1  CL 99 102  CO2 24 23  GLUCOSE 137* 108*  BUN 69* 82*  CREATININE 9.45* 10.42*  CALCIUM 9.4 9.4  PHOS  --  6.4*   GFR Estimated Creatinine Clearance: 5.7 mL/min (A) (by C-G formula based on SCr of 10.42 mg/dL (H)). Liver Function Tests: Recent Labs  Lab 11/18/22 1700 11/20/22 0813  AST 11*  --   ALT 7  --   ALKPHOS 74  --   BILITOT 0.7  --   PROT 7.2  --   ALBUMIN 3.4* 2.8*   No results for input(s): "LIPASE", "AMYLASE" in the last 168 hours. No results for input(s): "AMMONIA" in the last 168 hours. Coagulation profile Recent Labs  Lab 11/18/22 1756  INR 1.4*    CBC: Recent Labs  Lab 11/18/22 1700 11/19/22 0108 11/19/22 0552 11/19/22 2359 11/20/22 0813  WBC 10.4  --   --  8.1 7.9  NEUTROABS 6.7  --   --   --   --   HGB 9.8* 9.9* 10.3* 9.9*  10.2*  HCT 31.5*  --   --  31.0* 32.4*  MCV 96.6  --   --  94.2 96.4  PLT 95*  --   --  121* 124*   Cardiac Enzymes: No results for input(s): "CKTOTAL", "CKMB", "CKMBINDEX", "TROPONINI" in the last 168 hours. BNP (last 3 results) No results for input(s): "PROBNP" in the last 8760 hours. CBG: Recent Labs  Lab 11/19/22 0731 11/19/22 1138 11/19/22 1558 11/19/22 2100 11/20/22 0835  GLUCAP 90 77 128* 147* 96   D-Dimer: No results for input(s): "DDIMER" in the last 72 hours. Hgb A1c: Recent Labs    11/18/22 0108  HGBA1C 5.6   Lipid Profile: No results for input(s): "CHOL", "HDL", "LDLCALC", "TRIG", "CHOLHDL", "LDLDIRECT" in the last 72 hours. Thyroid function studies: No results for input(s): "TSH", "T4TOTAL", "T3FREE", "THYROIDAB" in the last 72 hours.  Invalid input(s): "FREET3" Anemia work up: No results for input(s): "VITAMINB12", "FOLATE", "FERRITIN", "TIBC", "IRON", "RETICCTPCT" in the last 72 hours. Sepsis Labs: Recent Labs  Lab 11/18/22 1700 11/19/22 2359 11/20/22 0813  PROCALCITON 19.94  --   --   WBC 10.4 8.1 7.9    Microbiology Recent Results (from the past 240 hour(s))  Blood culture (routine x 2)     Status: None (Preliminary result)   Collection Time: 11/18/22  5:00 PM   Specimen: BLOOD  Result Value Ref Range Status   Specimen Description BLOOD BLOOD LEFT ARM  Final   Special Requests   Final    BOTTLES DRAWN AEROBIC AND ANAEROBIC Blood Culture results may not be optimal due to an excessive volume of blood received in culture bottles   Culture   Final    NO GROWTH 2 DAYS Performed at [facility], [address], [place], [place] [address]    Report Status PENDING  Incomplete  Aerobic Culture w Gram Stain (superficial specimen)     Status: None (Preliminary result)   Collection Time: 11/18/22  5:00 PM   Specimen: Wound  Result Value Ref Range Status   Specimen Description   Final    WOUND Performed at [facility], [address], [place], [place] [address]    Special Requests   Final    HDF Performed at [facility], [address] [place], [place] [address]    Gram Stain   Final    FEW GRAM POSITIVE COCCI IN PAIRS IN CLUSTERS RARE WBC PRESENT, PREDOMINANTLY PMN RARE SQUAMOUS EPITHELIAL CELLS PRESENT    Culture   Final    ABUNDANT STAPHYLOCOCCUS AUREUS SUSCEPTIBILITIES TO FOLLOW Performed at [facility], [place] [address] [place], [place] [address]    Report Status PENDING  Incomplete  Blood culture (routine x 2)     Status: None (Preliminary result)   Collection Time: 11/19/22  5:52 AM   Specimen: BLOOD  Result Value Ref Range Status   Specimen Description BLOOD BLOOD LEFT ARM  Final   Special Requests   Final    BOTTLES DRAWN AEROBIC AND ANAEROBIC Blood Culture adequate volume   Culture   Final    NO GROWTH 1 DAY Performed at [facility], [address], [place], [place] [address]    Report Status PENDING  Incomplete    Procedures and diagnostic studies:  [place] Upper Ext Art Right Ltd  Result Date: 11/18/2022 CLINICAL DATA:  Recent right AV fistula, new swelling EXAM: RIGHT UPPER EXTREMITY ARTERIAL DUPLEX SCAN TECHNIQUE: Gray-scale sonography as well as color Doppler and duplex ultrasound was performed to evaluate the arteries of the upper extremity. COMPARISON:  None Available. FINDINGS: Sonographic evaluation of the right  upper extremity AV fistula was performed. A radiocephalic right upper extremity AV fistula is identified. Inflow artery: 3.2 mm, 60.69 cm/S Arterial anastomosis: 3.8 mm, 281.32 cm/S Fistula: Proximal: 6.4 mm, 239.91 cm/S Mid: 7.0 mm, 199.93 cm/S Distal: 11.3 mm, 471.25 cm/S Normal color flow and Doppler waveforms are seen within the visualized right radial artery and right cephalic vein. IMPRESSION: 1. Patent right radiocephalic AV fistula as above. Electronically Signed   By: Randa Ngo M.D.   On: 11/18/2022 20:05   US Venous Img Upper Uni  Right(DVT)  Result Date: 11/18/2022 CLINICAL DATA:  Arm swelling.  AV fistula right arm. EXAM: Right UPPER EXTREMITY VENOUS DOPPLER ULTRASOUND TECHNIQUE: Gray-scale sonography with graded compression, as well as color Doppler and duplex ultrasound were performed to evaluate the upper extremity deep venous system from the level of the subclavian vein and including the jugular, axillary, basilic, radial, ulnar and upper cephalic vein. Spectral Doppler was utilized to evaluate flow at rest and with distal augmentation maneuvers. COMPARISON:  None Available. FINDINGS: Contralateral Subclavian Vein: Respiratory phasicity is normal and symmetric with the symptomatic side. No evidence of thrombus. Normal compressibility. Internal Jugular Vein: Nonocclusive thrombus noted in the right internal jugular vein. Subclavian Vein: No evidence of thrombus. Normal compressibility, respiratory phasicity and response to augmentation. Axillary Vein: Nonocclusive thrombus noted in the axillary vein. Cephalic Vein: No evidence of thrombus. Normal compressibility, respiratory phasicity and response to augmentation. Basilic Vein: No evidence of thrombus. Normal compressibility, respiratory phasicity and response to augmentation. Brachial Veins: No evidence of thrombus. Normal compressibility, respiratory phasicity and response to augmentation. Radial Veins: No evidence of thrombus. Normal compressibility, respiratory phasicity and response to augmentation. Ulnar Veins: No evidence of thrombus. Normal compressibility, respiratory phasicity and response to augmentation. Venous Reflux:  None visualized. Other Findings: Brisk and pulsatile flow in the larger veins related to the presence of the fistula. IMPRESSION: Nonocclusive thrombus noted in the right internal jugular and axillary veins. Electronically Signed   By: Nelson Chimes M.D.   On: 11/18/2022 19:51   DG Chest Portable 1 View  Result Date: 11/18/2022 CLINICAL DATA:  Catheter  placement. EXAM: PORTABLE CHEST 1 VIEW COMPARISON:  Chest radiograph dated 03/03/2022. FINDINGS: Right IJ dual-lumen dialysis catheter with tips over central SVC close to the cavoatrial junction. No pneumothorax. No focal consolidation or pleural effusion. The cardiac silhouette is within normal limits. No acute osseous pathology. IMPRESSION: Right IJ dual-lumen dialysis catheter with tips over the central SVC. No pneumothorax. Electronically Signed   By: Anner Crete M.D.   On: 11/18/2022 19:17               LOS: 2 days   [name] [name]  [facility]   Pager on [email or web address]. If 7PM-7AM, please contact night-coverage at [email or web address]     11/20/2022, 9:02 AM

## 2022-11-20 NOTE — H&P (View-Only) (Signed)
Progress Note    11/20/2022 11:03 AM * No surgery date entered *  Subjective:   This is a very nice 82 year old gentleman who has renal insufficiency.  He previously was receiving renal replacement therapy via right internal jugular TDC.  He had construction of a right brachiocephalic fistula on 26 October 2022 by Dr. Lucky Cowboy.  He did well, but then noticed that his arm was getting bigger in size.  He came to the emergency room yesterday for evaluation and treatment.  Ultrasound demonstrated patent brachiocephalic fistula, but thrombus within the internal jugular vein that was partially occlusive adjacent to the [facility] in place, subclavian vein appeared to be widely patent, but axillary thrombus likely affecting the outflow tract of the arm and the AV fistula.  On exam this morning in the hemodialysis unit, the patient was resting comfortably in bed receiving hemodialysis.  Patient currently has a permacath dialysis access catheter to his right chest.  No complications to note hemodialysis is running well.  Patient's right arm notably +4 swelling/edema.  Swelling and edema proximally at the shoulder to distally at the fingertips.  No complaints overnight.  Vitals all remained stable.  Patient has been n.p.o. 24 hours.     Vitals:   11/20/22 1030 11/20/22 1100  BP: 127/66 (!) 126/53  Pulse: (!) 59 61  Resp: 11 12  Temp:    SpO2: 99% 100%   Physical Exam: Cardiac:  RRR no gallup's, murmurs or rubs.  Lungs: Clear on auscultation all fields but diminished in the bases. Incisions:  N/A Extremities: Right upper extremity with +4 edema from shoulder to fingertips.  Unable to palpate radial or ulnar pulse due to swelling. Abdomen: Positive bowel sounds in all 4 quadrants, soft, nondistended, nontender. Neurologic: Alert and oriented x 3, follows all commands appropriately, answers all questions appropriately.  CBC    Component Value Date/Time   WBC 7.9 11/20/2022 0813   RBC 3.36 (L) 11/20/2022 0813    HGB 10.2 (L) 11/20/2022 0813   HGB 10.7 (L) 12/26/2013 1035   HCT 32.4 (L) 11/20/2022 0813   HCT 32.5 (L) 12/26/2013 1035   PLT 124 (L) 11/20/2022 0813   PLT 142 (L) 12/26/2013 1035   MCV 96.4 11/20/2022 0813   MCV 90 12/26/2013 1035   MCH 30.4 11/20/2022 0813   MCHC 31.5 11/20/2022 0813   RDW 15.9 (H) 11/20/2022 0813   RDW 13.9 12/26/2013 1035   LYMPHSABS 2.2 11/18/2022 1700   LYMPHSABS 1.2 12/26/2013 1035   MONOABS 1.0 11/18/2022 1700   MONOABS 0.6 12/26/2013 1035   EOSABS 0.5 11/18/2022 1700   EOSABS 0.5 12/26/2013 1035   BASOSABS 0.0 11/18/2022 1700   BASOSABS 0.1 12/26/2013 1035    BMET    Component Value Date/Time   NA 139 11/20/2022 0813   NA 141 10/14/2012 0518   K 5.1 11/20/2022 0813   K 4.0 10/14/2012 0518   CL 102 11/20/2022 0813   CL 111 (H) 10/14/2012 0518   CO2 23 11/20/2022 0813   CO2 20 (L) 10/14/2012 0518   GLUCOSE 108 (H) 11/20/2022 0813   GLUCOSE 171 (H) 10/14/2012 0518   BUN 82 (H) 11/20/2022 0813   BUN 44 (H) 10/14/2012 0518   CREATININE 10.42 (H) 11/20/2022 0813   CREATININE 2.54 (H) 10/14/2012 0518   CALCIUM 9.4 11/20/2022 0813   CALCIUM 9.2 10/14/2012 0518   GFRNONAA 5 (L) 11/20/2022 0813   GFRNONAA 25 (L) 10/14/2012 0518   GFRAA 10 (L) 06/17/2018 1132   GFRAA  29 (L) 10/14/2012 0518    INR    Component Value Date/Time   INR 1.4 (H) 11/18/2022 1756     Intake/Output Summary (Last 24 hours) at 11/20/2022 1103 Last data filed at 11/19/2022 2134 Gross per 24 hour  Intake 200 ml  Output 350 ml  Net -150 ml     Assessment/Plan:  82 y.o. male is s/p patent fistula with partially occluded thrombus within the right IJ vein.* No surgery date entered *   PLAN: Patient will be taken to the vascular lab later this afternoon for venogram of the right upper extremity with possible intervention and mechanical thrombectomy.  Patient verbalizes understanding of the procedure, benefits, complications, and risks.  He would like to proceed this  afternoon.  Patient remains n.p.o. for the last 24 hours.  Patient currently on hemodialysis.  Patient is also currently on heparin infusion for his right upper extremity and we will continue that until procedure.  DVT prophylaxis: Heparin infusion.  Plan was discussed in detail with Dr. [name] MD and he agrees with the plan.   Drema Pry [facility] 11/20/2022 11:03 AM

## 2022-11-20 NOTE — OR Nursing (Signed)
Post venogram Dr Lucky Cowboy spoke with patient and called daughter on phone to discuss results. Right arm elevated on multiple pillows. Dressing righ tupper arm dry and intact.  Report called to Laureen Ochs, plan to return to inpt room 241 at 1850.

## 2022-11-20 NOTE — Consult Note (Signed)
ANTICOAGULATION CONSULT NOTE  Pharmacy Consult for heparin infusion Indication: VTE treatment (possible upper extremity DVT)  Allergies  Allergen Reactions   Fish Allergy Swelling   Ciprofloxacin     Unknown reaction   Doxazosin Other (See Comments)    unknown    Patient Measurements: Height: 5\' 9"  (175.3 cm) Weight: 79.4 kg (175 lb) IBW/kg (Calculated) : 70.7 Heparin Dosing Weight: 79.4 kg  Vital Signs: Temp: 97.7 F (36.5 C) (04/01 0935) Temp Source: Oral (04/01 0935) BP: 123/52 (04/01 1000) Pulse Rate: 58 (04/01 1000)  Labs: Recent Labs    11/18/22 1700 11/18/22 1756 11/19/22 0108 11/19/22 1458 11/19/22 2359 11/20/22 0813  HGB 9.8*  --    < >  --  9.9* 10.2*  HCT 31.5*  --   --   --  31.0* 32.4*  PLT 95*  --   --   --  121* 124*  APTT  --  37*  --   --   --   --   LABPROT  --  16.7*  --   --   --   --   INR  --  1.4*  --   --   --   --   HEPARINUNFRC  --   --    < > 0.79* 0.67 0.61  CREATININE 9.45*  --   --   --   --  10.42*   < > = values in this interval not displayed.     Estimated Creatinine Clearance: 5.7 mL/min (A) (by C-G formula based on SCr of 10.42 mg/dL (H)).   Medical History: Past Medical History:  Diagnosis Date   Anemia    Arthritis    Cancer    Chronic kidney disease    Diabetes mellitus without complication    ESRD (end stage renal disease)    GERD (gastroesophageal reflux disease)    h/o   Gout    Hyperlipidemia, mixed    Hypertension    Hypokalemia    Renal mass    Secondary hyperparathyroidism of renal origin    Sleep apnea    h/o no cpap   Thrombocytopenia     Medications:  No home anticoagulation per pharmacist review  Assessment: 82 yo male presented to ED with right arm swelling.  Patient has ESRD on hemodialysis with right AV fistula that was placed on 10/26/22.  Pharmacy consulted to start heparin infusion due to concern for upper extremity DVT. Vascular consulted.   3/31:  0552 HL = 0.23, SUBtherapeutic  1300  >1600 u/hr 3/31   1458  HL=0.79   SUPRAtherapeutic 1600 >1500 u/hr 3/31   2359  HL = 0.67  Therapeutic X 1 @ 1500 un/hr  4/1 0813 HL = 0.61.    Goal of Therapy:  Heparin level 0.3-0.7 units/ml Monitor platelets by anticoagulation protocol: Yes   Plan:  Heparin level is therapeutic. Will continue heparin infusion at 1500 units/hr. Recheck heparin level and CBC with AM labs.   Oswald Hillock, PharmD, BCPS 11/20/2022,10:28 AM

## 2022-11-20 NOTE — TOC Benefit Eligibility Note (Signed)
Patient Teacher, English as a foreign language completed.    The patient is currently admitted and upon discharge could be taking Eliquis 5 mg.  The current 30 day co-pay is $47.00.   The patient is insured through [employer] Part D   This test claim was processed through [facility] amounts may vary at other pharmacies due to pharmacy/plan contracts, or as the patient moves through the different stages of their insurance plan.  Lyndel Safe, [place] Patient Advocate Specialist [facility] Patient Advocate Team Direct Number: [phone]  Fax: [phone]

## 2022-11-20 NOTE — Progress Notes (Signed)
Progress Note    11/20/2022 11:03 AM * No surgery date entered *  Subjective:   This is a very nice 82 year old gentleman who has renal insufficiency.  He previously was receiving renal replacement therapy via right internal jugular TDC.  He had construction of a right brachiocephalic fistula on 26 October 2022 by Dr. Lucky Cowboy.  He did well, but then noticed that his arm was getting bigger in size.  He came to the emergency room yesterday for evaluation and treatment.  Ultrasound demonstrated patent brachiocephalic fistula, but thrombus within the internal jugular vein that was partially occlusive adjacent to the [facility] in place, subclavian vein appeared to be widely patent, but axillary thrombus likely affecting the outflow tract of the arm and the AV fistula.  On exam this morning in the hemodialysis unit, the patient was resting comfortably in bed receiving hemodialysis.  Patient currently has a permacath dialysis access catheter to his right chest.  No complications to note hemodialysis is running well.  Patient's right arm notably +4 swelling/edema.  Swelling and edema proximally at the shoulder to distally at the fingertips.  No complaints overnight.  Vitals all remained stable.  Patient has been n.p.o. 24 hours.     Vitals:   11/20/22 1030 11/20/22 1100  BP: 127/66 (!) 126/53  Pulse: (!) 59 61  Resp: 11 12  Temp:    SpO2: 99% 100%   Physical Exam: Cardiac:  RRR no gallup's, murmurs or rubs.  Lungs: Clear on auscultation all fields but diminished in the bases. Incisions:  N/A Extremities: Right upper extremity with +4 edema from shoulder to fingertips.  Unable to palpate radial or ulnar pulse due to swelling. Abdomen: Positive bowel sounds in all 4 quadrants, soft, nondistended, nontender. Neurologic: Alert and oriented x 3, follows all commands appropriately, answers all questions appropriately.  CBC    Component Value Date/Time   WBC 7.9 11/20/2022 0813   RBC 3.36 (L) 11/20/2022 0813    HGB 10.2 (L) 11/20/2022 0813   HGB 10.7 (L) 12/26/2013 1035   HCT 32.4 (L) 11/20/2022 0813   HCT 32.5 (L) 12/26/2013 1035   PLT 124 (L) 11/20/2022 0813   PLT 142 (L) 12/26/2013 1035   MCV 96.4 11/20/2022 0813   MCV 90 12/26/2013 1035   MCH 30.4 11/20/2022 0813   MCHC 31.5 11/20/2022 0813   RDW 15.9 (H) 11/20/2022 0813   RDW 13.9 12/26/2013 1035   LYMPHSABS 2.2 11/18/2022 1700   LYMPHSABS 1.2 12/26/2013 1035   MONOABS 1.0 11/18/2022 1700   MONOABS 0.6 12/26/2013 1035   EOSABS 0.5 11/18/2022 1700   EOSABS 0.5 12/26/2013 1035   BASOSABS 0.0 11/18/2022 1700   BASOSABS 0.1 12/26/2013 1035    BMET    Component Value Date/Time   NA 139 11/20/2022 0813   NA 141 10/14/2012 0518   K 5.1 11/20/2022 0813   K 4.0 10/14/2012 0518   CL 102 11/20/2022 0813   CL 111 (H) 10/14/2012 0518   CO2 23 11/20/2022 0813   CO2 20 (L) 10/14/2012 0518   GLUCOSE 108 (H) 11/20/2022 0813   GLUCOSE 171 (H) 10/14/2012 0518   BUN 82 (H) 11/20/2022 0813   BUN 44 (H) 10/14/2012 0518   CREATININE 10.42 (H) 11/20/2022 0813   CREATININE 2.54 (H) 10/14/2012 0518   CALCIUM 9.4 11/20/2022 0813   CALCIUM 9.2 10/14/2012 0518   GFRNONAA 5 (L) 11/20/2022 0813   GFRNONAA 25 (L) 10/14/2012 0518   GFRAA 10 (L) 06/17/2018 1132   GFRAA  29 (L) 10/14/2012 0518    INR    Component Value Date/Time   INR 1.4 (H) 11/18/2022 1756     Intake/Output Summary (Last 24 hours) at 11/20/2022 1103 Last data filed at 11/19/2022 2134 Gross per 24 hour  Intake 200 ml  Output 350 ml  Net -150 ml     Assessment/Plan:  82 y.o. male is s/p patent fistula with partially occluded thrombus within the right IJ vein.* No surgery date entered *   PLAN: Patient will be taken to the vascular lab later this afternoon for venogram of the right upper extremity with possible intervention and mechanical thrombectomy.  Patient verbalizes understanding of the procedure, benefits, complications, and risks.  He would like to proceed this  afternoon.  Patient remains n.p.o. for the last 24 hours.  Patient currently on hemodialysis.  Patient is also currently on heparin infusion for his right upper extremity and we will continue that until procedure.  DVT prophylaxis: Heparin infusion.  Plan was discussed in detail with Dr. [name] MD and he agrees with the plan.   Drema Pry [facility] 11/20/2022 11:03 AM

## 2022-11-21 ENCOUNTER — Encounter: Payer: Self-pay | Admitting: Vascular Surgery

## 2022-11-21 DIAGNOSIS — L03113 Cellulitis of right upper limb: Secondary | ICD-10-CM

## 2022-11-21 DIAGNOSIS — N186 End stage renal disease: Secondary | ICD-10-CM | POA: Diagnosis not present

## 2022-11-21 DIAGNOSIS — Z992 Dependence on renal dialysis: Secondary | ICD-10-CM | POA: Diagnosis not present

## 2022-11-21 DIAGNOSIS — I82C11 Acute embolism and thrombosis of right internal jugular vein: Secondary | ICD-10-CM | POA: Diagnosis not present

## 2022-11-21 LAB — CBC
HCT: 32 % — ABNORMAL LOW (ref 39.0–52.0)
Hemoglobin: 10.5 g/dL — ABNORMAL LOW (ref 13.0–17.0)
MCH: 30.6 pg (ref 26.0–34.0)
MCHC: 32.8 g/dL (ref 30.0–36.0)
MCV: 93.3 fL (ref 80.0–100.0)
Platelets: 114 10*3/uL — ABNORMAL LOW (ref 150–400)
RBC: 3.43 MIL/uL — ABNORMAL LOW (ref 4.22–5.81)
RDW: 15.5 % (ref 11.5–15.5)
WBC: 6.6 10*3/uL (ref 4.0–10.5)
nRBC: 0 % (ref 0.0–0.2)

## 2022-11-21 LAB — BASIC METABOLIC PANEL
Anion gap: 14 (ref 5–15)
BUN: 40 mg/dL — ABNORMAL HIGH (ref 8–23)
CO2: 24 mmol/L (ref 22–32)
Calcium: 9.8 mg/dL (ref 8.9–10.3)
Chloride: 99 mmol/L (ref 98–111)
Creatinine, Ser: 6.36 mg/dL — ABNORMAL HIGH (ref 0.61–1.24)
GFR, Estimated: 8 mL/min — ABNORMAL LOW (ref >60)
Glucose, Bld: 101 mg/dL — ABNORMAL HIGH (ref 70–99)
Potassium: 4 mmol/L (ref 3.5–5.1)
Sodium: 137 mmol/L (ref 135–145)

## 2022-11-21 LAB — MRSA NEXT GEN BY PCR, NASAL: MRSA by PCR Next Gen: DETECTED — AB

## 2022-11-21 LAB — GLUCOSE, CAPILLARY
Glucose-Capillary: 91 mg/dL (ref 70–99)
Glucose-Capillary: 133 mg/dL — ABNORMAL HIGH (ref 70–99)
Glucose-Capillary: 128 mg/dL — ABNORMAL HIGH (ref 70–99)
Glucose-Capillary: 184 mg/dL — ABNORMAL HIGH (ref 70–99)

## 2022-11-21 LAB — HEPATITIS B SURFACE ANTIBODY, QUANTITATIVE: Hep B S AB Quant (Post): <3.5 m[IU]/mL — ABNORMAL LOW (ref >9.9)

## 2022-11-21 NOTE — Progress Notes (Signed)
Progress Note    [name] [name]  [id number] DOB: 1940-11-09  DOA: 11/18/2022 PCP: Dion Body, MD      Brief Narrative:    Medical records reviewed and are as summarized below:  [name] [name] is a 82 y.o. 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  He presented to the hospital for swelling and pain in the right upper extremity extending from the fingers to the mid upper arm.      Assessment/Plan:   Principal Problem:   Internal jugular (IJ) vein thromboembolism, acute, right Active Problems:   Postoperative complication of skin involving drainage from surgical wound from right arm AVF placement   Cellulitis of right arm   S/P arteriovenous (AV) fistula creation 10/26/22   Acute on chronic anemia   ESRD (end stage renal disease) on dialysis   Anemia in chronic kidney disease   Essential thrombocytopenia   Benign essential hypertension   Diabetes mellitus, type II   Gout    Body mass index is 25.1 kg/m.   Acute right internal jugular vein and axillary vein thrombosis, occlusion of superior vena cava around his permacath: IV heparin drip has been discontinued per vascular surgeon.  Analgesics as needed for pain.  Follow-up with vascular surgeon for further recommendations.   Right antecubital fossa wound infection from right AV fistula site: Wound culture grew MRSA.  Continue IV vancomycin.  Analgesics as needed for pain.      S/p hypoglycemic event on 11/20/2022: Glucose was 67.  This occurred when he was n.p.o. while awaiting vascular procedure.  Glucose levels have improved since then.   ESRD: Follow-up with nephrologist for hemodialysis   Other comorbidities include hypertension, gout, thrombocytopenia, anemia of chronic disease, type  II DM   Diet Order             Diet renal/carb modified with fluid restriction Diet-HS Snack? Nothing; Fluid restriction: 1200 mL Fluid; Room service appropriate? Yes; Fluid consistency: Thin  Diet effective now                            Consultants: Vascular surgery Nephrologist  Procedures: Plan for right IJ thrombectomy    Medications:    amLODipine  10 mg Oral BH-q7a   calcitRIOL  0.75 mcg Oral Q M,W,F   Chlorhexidine Gluconate Cloth  6 each Topical Daily   cinacalcet  30 mg Oral Q M,W,F   insulin aspart  0-5 Units Subcutaneous QHS   insulin aspart  0-6 Units Subcutaneous TID WC   metoprolol tartrate  25 mg Oral BID   pravastatin  20 mg Oral q1800   Continuous Infusions:  vancomycin Stopped (11/20/22 1308)     Anti-infectives (From admission, onward)    Start     Dose/Rate Route Frequency Ordered Stop   11/20/22 1200  vancomycin (VANCOREADY) IVPB 750 mg/150 mL        750 mg 150 mL/hr over 60 Minutes Intravenous Every M-W-F (Hemodialysis) 11/19/22 1406     11/19/22 1206  vancomycin variable dose per unstable renal function (pharmacist dosing)  Status:  Discontinued         Does not apply See admin instructions 11/19/22 1206 11/19/22 1406   11/19/22 0800  vancomycin (VANCOREADY) IVPB 750 mg/150 mL  Status:  Discontinued  750 mg 150 mL/hr over 60 Minutes Intravenous Every Dialysis 11/18/22 2147 11/20/22 1028   11/18/22 2300  ceFEPIme (MAXIPIME) 1 g in sodium chloride 0.9 % 100 mL IVPB  Status:  Discontinued        1 g 200 mL/hr over 30 Minutes Intravenous Every 24 hours 11/18/22 2136 11/21/22 0746   11/18/22 1645  vancomycin (VANCOREADY) IVPB 1750 mg/350 mL        1,750 mg 175 mL/hr over 120 Minutes Intravenous  Once 11/18/22 1641 11/18/22 2013   11/18/22 1645  cefTRIAXone (ROCEPHIN) 2 g in sodium chloride 0.9 % 100 mL IVPB        2 g 200 mL/hr over 30 Minutes Intravenous  Once 11/18/22 1641 11/18/22 1850              Family  Communication/Anticipated D/C date and plan/Code Status   DVT prophylaxis:      Code Status: Full Code  Family Communication: None Disposition Plan: Plan to discharge home in 3 to 4 days   Status is: Inpatient Remains inpatient appropriate because: Right upper extremity DVT       Subjective:   Interval events noted.  He was confused, agitated and combative last night.  He feels better today.  He still has pain in the right arm.  Objective:    Vitals:   11/20/22 1845 11/20/22 1941 11/21/22 0108 11/21/22 0758  BP: (!) 108/58 (!) 145/63 (!) 125/55 (!) 120/56  Pulse: 84 93 97 94  Resp: (!) 21 18 18 20   Temp:  97.8 F (36.6 C) 99.2 F (37.3 C) 98.1 F (36.7 C)  TempSrc:      SpO2: 96% 100% 100% 95%  Weight:      Height:       No data found.   Intake/Output Summary (Last 24 hours) at 11/21/2022 1427 Last data filed at 11/21/2022 0800 Gross per 24 hour  Intake 350 ml  Output 200 ml  Net 150 ml   Filed Weights   11/20/22 0935 11/20/22 1320 11/20/22 1636  Weight: 78.1 kg 77.1 kg 77.1 kg    Exam:       GEN: NAD SKIN: Warm and dry.  Permacath on the right upper chest EYES: No pallor or icterus ENT: MMM CV: RRR PULM: CTA B ABD: soft, ND, NT, +BS CNS: AAO x 3, non focal EXT: Right upper extremity swelling and tenderness.  Some erythema around the wound on the antecubital area.  No drainage noted at this time.   Data Reviewed:   I have personally reviewed following labs and imaging studies:  Labs: Labs show the following:   Basic Metabolic Panel: Recent Labs  Lab 11/18/22 1700 11/20/22 0813 11/21/22 0425  NA 136 139 137  K 4.3 5.1 4.0  CL 99 102 99  CO2 24 23 24   GLUCOSE 137* 108* 101*  BUN 69* 82* 40*  CREATININE 9.45* 10.42* 6.36*  CALCIUM 9.4 9.4 9.8  PHOS  --  6.4*  --    GFR Estimated Creatinine Clearance: 9.3 mL/min (A) (by C-G formula based on SCr of 6.36 mg/dL (H)). Liver Function Tests: Recent Labs  Lab 11/18/22 1700  11/20/22 0813  AST 11*  --   ALT 7  --   ALKPHOS 74  --   BILITOT 0.7  --   PROT 7.2  --   ALBUMIN 3.4* 2.8*   No results for input(s): "LIPASE", "AMYLASE" in the last 168 hours. No results for input(s): "AMMONIA" in the last  168 hours. Coagulation profile Recent Labs  Lab 11/18/22 1756  INR 1.4*    CBC: Recent Labs  Lab 11/18/22 1700 11/19/22 0108 11/19/22 0552 11/19/22 2359 11/20/22 0813 11/21/22 0425  WBC 10.4  --   --  8.1 7.9 6.6  NEUTROABS 6.7  --   --   --   --   --   HGB 9.8* 9.9* 10.3* 9.9* 10.2* 10.5*  HCT 31.5*  --   --  31.0* 32.4* 32.0*  MCV 96.6  --   --  94.2 96.4 93.3  PLT 95*  --   --  121* 124* 114*   Cardiac Enzymes: No results for input(s): "CKTOTAL", "CKMB", "CKMBINDEX", "TROPONINI" in the last 168 hours. BNP (last 3 results) No results for input(s): "PROBNP" in the last 8760 hours. CBG: Recent Labs  Lab 11/20/22 0835 11/20/22 1537 11/20/22 1626 11/21/22 0750 11/21/22 1237  GLUCAP 96 67* 101* 91 133*   D-Dimer: No results for input(s): "DDIMER" in the last 72 hours. Hgb A1c: No results for input(s): "HGBA1C" in the last 72 hours.  Lipid Profile: No results for input(s): "CHOL", "HDL", "LDLCALC", "TRIG", "CHOLHDL", "LDLDIRECT" in the last 72 hours. Thyroid function studies: No results for input(s): "TSH", "T4TOTAL", "T3FREE", "THYROIDAB" in the last 72 hours.  Invalid input(s): "FREET3" Anemia work up: No results for input(s): "VITAMINB12", "FOLATE", "FERRITIN", "TIBC", "IRON", "RETICCTPCT" in the last 72 hours. Sepsis Labs: Recent Labs  Lab 11/18/22 1700 11/19/22 2359 11/20/22 0813 11/21/22 0425  PROCALCITON 19.94  --   --   --   WBC 10.4 8.1 7.9 6.6    Microbiology Recent Results (from the past 240 hour(s))  Blood culture (routine x 2)     Status: None (Preliminary result)   Collection Time: 11/18/22  5:00 PM   Specimen: BLOOD  Result Value Ref Range Status   Specimen Description BLOOD BLOOD LEFT ARM  Final   Special  Requests   Final    BOTTLES DRAWN AEROBIC AND ANAEROBIC Blood Culture results may not be optimal due to an excessive volume of blood received in culture bottles   Culture   Final    NO GROWTH 3 DAYS Performed at [facility], [address], [place], [place] [address]    Report Status PENDING  Incomplete  Aerobic Culture w Gram Stain (superficial specimen)     Status: None   Collection Time: 11/18/22  5:00 PM   Specimen: Wound  Result Value Ref Range Status   Specimen Description   Final    WOUND Performed at [facility], [address] [place], [place] [address]    Special Requests   Final    HDF Performed at [facility], [address] [place], [place] [address]    Gram Stain   Final    FEW GRAM POSITIVE COCCI IN PAIRS IN CLUSTERS RARE WBC PRESENT, PREDOMINANTLY PMN RARE SQUAMOUS EPITHELIAL CELLS PRESENT Performed at [facility], [place] [address], [place], [place] [address]    Culture   Final    ABUNDANT METHICILLIN RESISTANT STAPHYLOCOCCUS AUREUS   Report Status 11/20/2022 FINAL  Final   Organism ID, Bacteria METHICILLIN RESISTANT STAPHYLOCOCCUS AUREUS  Final      Susceptibility   Methicillin resistant staphylococcus aureus - MIC*    CIPROFLOXACIN >=8 RESISTANT Resistant     ERYTHROMYCIN >=8 RESISTANT Resistant     GENTAMICIN <=0.5 SENSITIVE Sensitive     OXACILLIN >=4 RESISTANT Resistant     TETRACYCLINE <=1 SENSITIVE  Sensitive     VANCOMYCIN <=0.5 SENSITIVE Sensitive     TRIMETH/SULFA <=10 SENSITIVE Sensitive     CLINDAMYCIN <=0.25 SENSITIVE Sensitive     RIFAMPIN <=0.5 SENSITIVE Sensitive     Inducible Clindamycin NEGATIVE Sensitive     * ABUNDANT METHICILLIN RESISTANT STAPHYLOCOCCUS AUREUS  Blood culture (routine x 2)     Status: None (Preliminary result)   Collection Time: 11/19/22  5:52 AM   Specimen: BLOOD  Result Value Ref Range Status   Specimen Description BLOOD BLOOD LEFT ARM  Final   Special Requests   Final     BOTTLES DRAWN AEROBIC AND ANAEROBIC Blood Culture adequate volume   Culture   Final    NO GROWTH 2 DAYS Performed at [facility], [address], [place], [place] [address]    Report Status PENDING  Incomplete  MRSA Next Gen by PCR, Nasal     Status: Abnormal   Collection Time: 11/21/22 10:45 AM   Specimen: Nasal Mucosa; Nasal Swab  Result Value Ref Range Status   MRSA by PCR Next Gen DETECTED (A) NOT DETECTED Final    Comment: RESULT CALLED TO, READ BACK BY AND VERIFIED WITH: RAMONE MAIZA 11/21/22 1303 MW (NOTE) The GeneXpert MRSA Assay (FDA approved for NASAL specimens only), is one component of a comprehensive MRSA colonization surveillance program. It is not intended to diagnose MRSA infection nor to guide or monitor treatment for MRSA infections. Test performance is not FDA approved in patients less than 58 years old. Performed at [facility], [address] [place], [place] [address]     Procedures and diagnostic studies:  PERIPHERAL VASCULAR CATHETERIZATION  Result Date: 11/20/2022 See surgical note for result.              LOS: 3 days   Chardai Gangemi  [facility]   Pager on [email or web address]. If 7PM-7AM, please contact night-coverage at [email or web address]     11/21/2022, 2:27 PM

## 2022-11-21 NOTE — Consult Note (Signed)
Pharmacy Antibiotic Note  Locryn Biggers is a 82 y.o. male admitted on 11/18/2022 with internal jugular thromboembolism.  Pharmacy has been consulted for vancomycin dosing for wound infection. Wound culture growing MRSA.     Plan: Pt received vancomycin loading dose of 1750 mg x 1 followed by 750 mg with HD. Will plan to order a vancomycin random level 4/3 AM.   Height: 5\' 9"  (175.3 cm) Weight: 77.1 kg (169 lb 15.6 oz) IBW/kg (Calculated) : 70.7  Temp (24hrs), Avg:98 F (36.7 C), Min:97.6 F (36.4 C), Max:99.2 F (37.3 C)  Recent Labs  Lab 11/18/22 1700 11/19/22 2359 11/20/22 0813 11/21/22 0425  WBC 10.4 8.1 7.9 6.6  CREATININE 9.45*  --  10.42* 6.36*     Estimated Creatinine Clearance: 9.3 mL/min (A) (by C-G formula based on SCr of 6.36 mg/dL (H)).    Allergies  Allergen Reactions   Fish Allergy Swelling   Ciprofloxacin     Unknown reaction   Doxazosin Other (See Comments)    unknown    Antimicrobials this admission: vancomycin 3/30 >>  cefepime 3/30 >> 4/2 Ceftriaxone 3/30 x 1  Dose adjustments this admission: N/A  Microbiology results: 3/30 BCx: pending 3/30 Wound: MRSA  Thank you for allowing pharmacy to be a part of this patient's care.  Oswald Hillock, PharmD 11/21/2022 7:47 AM

## 2022-11-21 NOTE — Care Management Important Message (Signed)
Important Message  Patient Details  Name: [name] [name] MRN: [id number] Date of Birth: 04-Jun-1941   Medicare Important Message Given:  Yes     Dannette Barbara 11/21/2022, 3:29 PM

## 2022-11-21 NOTE — Progress Notes (Signed)
[facility]  ROUNDING NOTE   Subjective:   [name] was admitted to [facility] on 11/18/2022 for Internal jugular vein thrombosis, right [I82.C11] Internal jugular (IJ) vein thromboembolism, acute, right [I82.C11] Acute deep vein thrombosis (DVT) of axillary vein of right upper extremity [I82.A11]  Patient laying in bed No family at bedside States she feels well today Right upper extremity edematous   Objective:  Vital signs in last 24 hours:  Temp:  [97.7 F (36.5 C)-99.2 F (37.3 C)] 98.1 F (36.7 C) (04/02 0758) Pulse Rate:  [0-97] 94 (04/02 0758) Resp:  [9-26] 20 (04/02 0758) BP: (108-145)/(54-73) 120/56 (04/02 0758) SpO2:  [88 %-100 %] 95 % (04/02 0758) Weight:  [77.1 kg] 77.1 kg (04/01 1636)  Weight change:  Filed Weights   11/20/22 0935 11/20/22 1320 11/20/22 1636  Weight: 78.1 kg 77.1 kg 77.1 kg    Intake/Output: I/O last 3 completed shifts: In: 350 [IV Piggyback:350] Out: [id number] [Urine:150; Other:1500]   Intake/Output this shift:  Total I/O In: -  Out: 200 [Urine:200]  Physical Exam: General: NAD  Head: Normocephalic, atraumatic. Moist oral mucosal membranes  Eyes: Anicteric  Lungs:  Clear to auscultation  Heart: Regular rate and rhythm  Abdomen:  Soft, nontender  Extremities:  Right upper extremity ++ edema.  Neurologic: Alert and oriented, moving all four extremities  Skin: No lesions, RUE wound  Access: Right IJ permcath, right arm AVF with edema    Basic Metabolic Panel: Recent Labs  Lab 11/18/22 1700 11/20/22 0813 11/21/22 0425  NA 136 139 137  K 4.3 5.1 4.0  CL 99 102 99  CO2 24 23 24   GLUCOSE 137* 108* 101*  BUN 69* 82* 40*  CREATININE 9.45* 10.42* 6.36*  CALCIUM 9.4 9.4 9.8  PHOS  --  6.4*  --      Liver Function Tests: Recent Labs  Lab 11/18/22 1700 11/20/22 0813  AST 11*  --   ALT 7  --   ALKPHOS 74  --   BILITOT 0.7  --   PROT 7.2  --   ALBUMIN 3.4* 2.8*    No results for input(s): "LIPASE",  "AMYLASE" in the last 168 hours. No results for input(s): "AMMONIA" in the last 168 hours.  CBC: Recent Labs  Lab 11/18/22 1700 11/19/22 0108 11/19/22 0552 11/19/22 2359 11/20/22 0813 11/21/22 0425  WBC 10.4  --   --  8.1 7.9 6.6  NEUTROABS 6.7  --   --   --   --   --   HGB 9.8* 9.9* 10.3* 9.9* 10.2* 10.5*  HCT 31.5*  --   --  31.0* 32.4* 32.0*  MCV 96.6  --   --  94.2 96.4 93.3  PLT 95*  --   --  121* 124* 114*     Cardiac Enzymes: No results for input(s): "CKTOTAL", "CKMB", "CKMBINDEX", "TROPONINI" in the last 168 hours.  BNP: Invalid input(s): "POCBNP"  CBG: Recent Labs  Lab 11/20/22 0835 11/20/22 1537 11/20/22 1626 11/21/22 0750 11/21/22 1237  GLUCAP 96 67* 101* 91 133*     Microbiology: Results for orders placed or performed during the hospital encounter of 11/18/22  Blood culture (routine x 2)     Status: None (Preliminary result)   Collection Time: 11/18/22  5:00 PM   Specimen: BLOOD  Result Value Ref Range Status   Specimen Description BLOOD BLOOD LEFT ARM  Final   Special Requests   Final    BOTTLES DRAWN AEROBIC AND ANAEROBIC Blood Culture  results may not be optimal due to an excessive volume of blood received in culture bottles   Culture   Final    NO GROWTH 3 DAYS Performed at [facility], [address] [place], [place] [address]    Report Status PENDING  Incomplete  Aerobic Culture w Gram Stain (superficial specimen)     Status: None   Collection Time: 11/18/22  5:00 PM   Specimen: Wound  Result Value Ref Range Status   Specimen Description   Final    WOUND Performed at [facility], [address] [place], [place] [address]    Special Requests   Final    HDF Performed at [facility], [address] [place], [place] [address]    Gram Stain   Final    FEW GRAM POSITIVE COCCI IN PAIRS IN CLUSTERS RARE WBC PRESENT, PREDOMINANTLY PMN RARE SQUAMOUS EPITHELIAL CELLS PRESENT Performed at [facility], [place] [address], [place], [place] [address]    Culture   Final    ABUNDANT METHICILLIN RESISTANT STAPHYLOCOCCUS AUREUS   Report Status 11/20/2022 FINAL  Final   Organism ID, Bacteria METHICILLIN RESISTANT STAPHYLOCOCCUS AUREUS  Final      Susceptibility   Methicillin resistant staphylococcus aureus - MIC*    CIPROFLOXACIN >=8 RESISTANT Resistant     ERYTHROMYCIN >=8 RESISTANT Resistant     GENTAMICIN <=0.5 SENSITIVE Sensitive     OXACILLIN >=4 RESISTANT Resistant     TETRACYCLINE <=1 SENSITIVE Sensitive     VANCOMYCIN <=0.5 SENSITIVE Sensitive     TRIMETH/SULFA <=10 SENSITIVE Sensitive     CLINDAMYCIN <=0.25 SENSITIVE Sensitive     RIFAMPIN <=0.5 SENSITIVE Sensitive     Inducible Clindamycin NEGATIVE Sensitive     * ABUNDANT METHICILLIN RESISTANT STAPHYLOCOCCUS AUREUS  Blood culture (routine x 2)     Status: None (Preliminary result)   Collection Time: 11/19/22  5:52 AM   Specimen: BLOOD  Result Value Ref Range Status   Specimen Description BLOOD BLOOD LEFT ARM  Final   Special Requests   Final    BOTTLES DRAWN AEROBIC AND ANAEROBIC Blood Culture adequate volume   Culture   Final    NO GROWTH 2 DAYS Performed at [facility], [address], [place], [place] [address]    Report Status PENDING  Incomplete  MRSA Next Gen by PCR, Nasal     Status: Abnormal   Collection Time: 11/21/22 10:45 AM   Specimen: Nasal Mucosa; Nasal Swab  Result Value Ref Range Status   MRSA by PCR Next Gen DETECTED (A) NOT DETECTED Final    Comment: RESULT CALLED TO, READ BACK BY AND VERIFIED WITH: RAMONE MAIZA 11/21/22 1303 MW (NOTE) The GeneXpert MRSA Assay (FDA approved for NASAL specimens only), is one component of a comprehensive MRSA colonization surveillance program. It is not intended to diagnose MRSA infection nor to guide or monitor treatment for MRSA infections. Test performance is not FDA approved in patients less than 3 years old. Performed at [facility], [address] [place], [place] [address]     Coagulation Studies: Recent Labs    11/18/22 1756  LABPROT 16.7*  INR 1.4*     Urinalysis: No results for input(s): "COLORURINE", "LABSPEC", "PHURINE", "GLUCOSEU", "HGBUR", "BILIRUBINUR", "KETONESUR", "PROTEINUR", "UROBILINOGEN", "NITRITE", "LEUKOCYTESUR" in the last 72 hours.  Invalid input(s): "APPERANCEUR"    Imaging: PERIPHERAL VASCULAR CATHETERIZATION  Result Date: 11/20/2022 See surgical note for result.    Medications:  vancomycin Stopped (11/20/22 1308)    amLODipine  10 mg Oral BH-q7a   calcitRIOL  0.75 mcg Oral Q M,W,F   Chlorhexidine Gluconate Cloth  6 each Topical Daily   cinacalcet  30 mg Oral Q M,W,F   insulin aspart  0-5 Units Subcutaneous QHS   insulin aspart  0-6 Units Subcutaneous TID WC   metoprolol tartrate  25 mg Oral BID   pravastatin  20 mg Oral q1800   acetaminophen **OR** acetaminophen, HYDROcodone-acetaminophen, morphine injection, ondansetron **OR** ondansetron (ZOFRAN) IV  Assessment/ Plan:  Mr. Rohan Brixey is a 82 y.o. black male with end stage renal disease on hemodialysis, hypertension, diabetes mellitus type II, diabetic retinopathy, gout, hyperlipidemia, sleep apnea, thrombocytopenia, who is admitted to [facility] on 11/18/2022 for Internal jugular vein thrombosis, right [I82.C11] Internal jugular (IJ) vein thromboembolism, acute, right [I82.C11] Acute deep vein thrombosis (DVT) of axillary vein of right upper extremity [I82.A11]  [facility] MWF [facility] Right IJ permcath 77.5kg  End stage renal disease: MWF schedule. Patient received dialysis today utilizing right chest PermCath.  Dialysis received yesterday, UF 1.5L achieved. Next treatment scheduled for Wednesday.   Complication of dialysis device with right IJ thrombosis. Placed on heparin gtt. Patient was on aspirin at home.  Vascular surgery to discuss options with patient. We are in favor of removing current permcath and placing a femoral line  for dialysis while awaiting fistula maturation.   Hypertension with chronic kidney disease:  Current regimen of amlodipine and metoprolol. Patient's enalapril is being held.    Anemia of chronic kidney disease: with history of thrombocytopenia and now thrombosis: Hemoglobin and platelets stable for this patient.  Secondary Hyperparathyroidism: outpatient labs: elevated PTH 702 and hyperphosphatemia.  Continue patient's cinacalcet and calcitriol.    LOS: 3   4/2/20242:14 PM

## 2022-11-22 DIAGNOSIS — S50811A Abrasion of right forearm, initial encounter: Secondary | ICD-10-CM | POA: Diagnosis not present

## 2022-11-22 DIAGNOSIS — N186 End stage renal disease: Secondary | ICD-10-CM | POA: Diagnosis not present

## 2022-11-22 DIAGNOSIS — E1169 Type 2 diabetes mellitus with other specified complication: Secondary | ICD-10-CM | POA: Diagnosis not present

## 2022-11-22 DIAGNOSIS — L089 Local infection of the skin and subcutaneous tissue, unspecified: Secondary | ICD-10-CM

## 2022-11-22 DIAGNOSIS — Z9889 Other specified postprocedural states: Secondary | ICD-10-CM

## 2022-11-22 LAB — CBC
HCT: 28.7 % — ABNORMAL LOW (ref 39.0–52.0)
Hemoglobin: 9.3 g/dL — ABNORMAL LOW (ref 13.0–17.0)
MCH: 30.4 pg (ref 26.0–34.0)
MCHC: 32.4 g/dL (ref 30.0–36.0)
MCV: 93.8 fL (ref 80.0–100.0)
Platelets: 124 10*3/uL — ABNORMAL LOW (ref 150–400)
RBC: 3.06 MIL/uL — ABNORMAL LOW (ref 4.22–5.81)
RDW: 15.5 % (ref 11.5–15.5)
WBC: 7.7 10*3/uL (ref 4.0–10.5)
nRBC: 0 % (ref 0.0–0.2)

## 2022-11-22 LAB — RENAL FUNCTION PANEL
Albumin: 2.8 g/dL — ABNORMAL LOW (ref 3.5–5.0)
Anion gap: 15 (ref 5–15)
BUN: 58 mg/dL — ABNORMAL HIGH (ref 8–23)
CO2: 25 mmol/L (ref 22–32)
Calcium: 9.8 mg/dL (ref 8.9–10.3)
Chloride: 98 mmol/L (ref 98–111)
Creatinine, Ser: 8.97 mg/dL — ABNORMAL HIGH (ref 0.61–1.24)
GFR, Estimated: 5 mL/min — ABNORMAL LOW (ref >60)
Glucose, Bld: 108 mg/dL — ABNORMAL HIGH (ref 70–99)
Phosphorus: 6.5 mg/dL — ABNORMAL HIGH (ref 2.5–4.6)
Potassium: 3.8 mmol/L (ref 3.5–5.1)
Sodium: 138 mmol/L (ref 135–145)

## 2022-11-22 LAB — GLUCOSE, CAPILLARY
Glucose-Capillary: 107 mg/dL — ABNORMAL HIGH (ref 70–99)
Glucose-Capillary: 135 mg/dL — ABNORMAL HIGH (ref 70–99)
Glucose-Capillary: 92 mg/dL (ref 70–99)
Glucose-Capillary: 135 mg/dL — ABNORMAL HIGH (ref 70–99)

## 2022-11-22 LAB — VANCOMYCIN, RANDOM: Vancomycin Rm: 17 ug/mL

## 2022-11-22 MED ORDER — HEPARIN SODIUM (PORCINE) 5000 UNIT/ML IJ SOLN
5000.0000 [IU] | Freq: Three times a day (TID) | INTRAMUSCULAR | Status: DC
  Administered 2022-11-22 – 2022-11-25 (×7): 5000 [IU] via SUBCUTANEOUS
  Filled 2022-11-22 (×7): qty 1

## 2022-11-22 NOTE — Progress Notes (Signed)
Progress Note    11/22/2022 2:44 PM 2 Days Post-Op  Subjective:  This is a very nice 82 year old gentleman who has renal insufficiency.  He previously was receiving renal replacement therapy via right internal jugular TDC.  He had construction of a right brachiocephalic fistula on 26 October 2022 by Dr. Lucky Cowboy.  He did well, but then noticed that his arm was getting bigger in size.  He came to the emergency room yesterday for evaluation and treatment.  Ultrasound demonstrated patent brachiocephalic fistula, but thrombus within the internal jugular vein that was partially occlusive adjacent to the [facility] in place, subclavian vein appeared to be widely patent, but axillary thrombus likely affecting the outflow tract of the arm and the AV fistula. 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   On exam this morning the patient rests comfortably in bed. Right upper arm remains edematous +4. Patient endorses pain upon movement. He is now POD #2 from right upper extremity venogram and superior venacavogram. There is no thrombus in the right brachial, axillary, subclavian, or innominate veins. These veins were relatively normal down to the innominate vein and then there was an occlusion of the superior vena cava and most proximal innominate vein around his existing PermCath. No complaints overnight. Vitals all remain stable.    Vitals:   11/22/22 1416 11/22/22 1430  BP: (!) 127/53 (!) 118/53  Pulse: 71 73  Resp: 15 18  Temp:    SpO2: 96% 98%   Physical Exam: Cardiac:  RRR No murmurs, rubs or gallops.  Lungs:  Clear to auscultation. Respiratory effort normal.  Incisions:  Right brachial insertion site. Dressing clean dry and intact.  Extremities:  Right upper extremity +4 edema. Unable to palpate pulse due to edema.  Abdomen:  Abdomen is nondistended, soft and nontender. Normal bowel sounds heard.  Neurologic: Judgement and insight appear normal. Mood & affect  appropriate.   CBC    Component Value Date/Time   WBC 6.6 11/21/2022 0425   RBC 3.43 (L) 11/21/2022 0425   HGB 10.5 (L) 11/21/2022 0425   HGB 10.7 (L) 12/26/2013 1035   HCT 32.0 (L) 11/21/2022 0425   HCT 32.5 (L) 12/26/2013 1035   PLT 114 (L) 11/21/2022 0425   PLT 142 (L) 12/26/2013 1035   MCV 93.3 11/21/2022 0425   MCV 90 12/26/2013 1035   MCH 30.6 11/21/2022 0425   MCHC 32.8 11/21/2022 0425   RDW 15.5 11/21/2022 0425   RDW 13.9 12/26/2013 1035   LYMPHSABS 2.2 11/18/2022 1700   LYMPHSABS 1.2 12/26/2013 1035   MONOABS 1.0 11/18/2022 1700   MONOABS 0.6 12/26/2013 1035   EOSABS 0.5 11/18/2022 1700   EOSABS 0.5 12/26/2013 1035   BASOSABS 0.0 11/18/2022 1700   BASOSABS 0.1 12/26/2013 1035    BMET    Component Value Date/Time   NA 138 11/22/2022 1412   NA 141 10/14/2012 0518   K 3.8 11/22/2022 1412   K 4.0 10/14/2012 0518   CL 98 11/22/2022 1412   CL 111 (H) 10/14/2012 0518   CO2 25 11/22/2022 1412   CO2 20 (L) 10/14/2012 0518   GLUCOSE 108 (H) 11/22/2022 1412   GLUCOSE 171 (H) 10/14/2012 0518   BUN 58 (H) 11/22/2022 1412   BUN 44 (H) 10/14/2012 0518   CREATININE 8.97 (H) 11/22/2022 1412   CREATININE 2.54 (H) 10/14/2012 0518   CALCIUM 9.8 11/22/2022 1412   CALCIUM 9.2 10/14/2012 0518   GFRNONAA 5 (L) 11/22/2022 1412  GFRNONAA 25 (L) 10/14/2012 0518   GFRAA 10 (L) 06/17/2018 1132   GFRAA 29 (L) 10/14/2012 0518    INR    Component Value Date/Time   INR 1.4 (H) 11/18/2022 1756    No intake or output data in the 24 hours ending 11/22/22 1444   Assessment/Plan:  82 y.o. male is s/p right upper extremity venogram and superior venacavogram.  2 Days Post-Op   PLAN: Patient will be taken back to the vascular lab on Friday 11/24/22 to remove the dialysis catheter in the patients chest which is causing the occlusion and replacing it in a femoral location due to the need for dialysis now. We will then let the edema subside and let the A/V fistula heal for another 2-4  weeks before using and then removing the femoral dialysis perma catheter.   DVT prophylaxis:  Heparin 5000 units SQ every 8 hours    Drema Pry [facility] 11/22/2022 2:44 PM

## 2022-11-22 NOTE — Progress Notes (Signed)
[facility]  ROUNDING NOTE   Subjective:   [name] was admitted to [facility] on 11/18/2022 for Internal jugular vein thrombosis, right [I82.C11] Internal jugular (IJ) vein thromboembolism, acute, right [I82.C11] Acute deep vein thrombosis (DVT) of axillary vein of right upper extremity [I82.A11]  Patient seen resting in bed Alert and oriented Right arm remains edematous and firm  Dialysis scheduled for later this afternoon   Objective:  Vital signs in last 24 hours:  Temp:  [97.7 F (36.5 C)-98.6 F (37 C)] 98.6 F (37 C) (04/03 1205) Pulse Rate:  [65-82] 74 (04/03 1205) Resp:  [18-19] 19 (04/03 1205) BP: (82-133)/(45-55) 111/45 (04/03 1205) SpO2:  [93 %-100 %] 98 % (04/03 1205) Weight:  [77.4 kg] 77.4 kg (04/03 0352)  Weight change: -0.7 kg Filed Weights   11/20/22 1320 11/20/22 1636 11/22/22 0352  Weight: 77.1 kg 77.1 kg 77.4 kg    Intake/Output: I/O last 3 completed shifts: In: 350 [IV Piggyback:350] Out: 200 [Urine:200]   Intake/Output this shift:  No intake/output data recorded.  Physical Exam: General: NAD  Head: Normocephalic, atraumatic. Moist oral mucosal membranes  Eyes: Anicteric  Lungs:  Clear to auscultation  Heart: Regular rate and rhythm  Abdomen:  Soft, nontender  Extremities:  Right upper extremity ++ edema.  Neurologic: Alert and oriented, moving all four extremities  Skin: No lesions, RUE wound  Access: Right IJ permcath, right arm AVF with edema    Basic Metabolic Panel: Recent Labs  Lab 11/18/22 1700 11/20/22 0813 11/21/22 0425  NA 136 139 137  K 4.3 5.1 4.0  CL 99 102 99  CO2 24 23 24   GLUCOSE 137* 108* 101*  BUN 69* 82* 40*  CREATININE 9.45* 10.42* 6.36*  CALCIUM 9.4 9.4 9.8  PHOS  --  6.4*  --      Liver Function Tests: Recent Labs  Lab 11/18/22 1700 11/20/22 0813  AST 11*  --   ALT 7  --   ALKPHOS 74  --   BILITOT 0.7  --   PROT 7.2  --   ALBUMIN 3.4* 2.8*    No results for input(s):  "LIPASE", "AMYLASE" in the last 168 hours. No results for input(s): "AMMONIA" in the last 168 hours.  CBC: Recent Labs  Lab 11/18/22 1700 11/19/22 0108 11/19/22 0552 11/19/22 2359 11/20/22 0813 11/21/22 0425  WBC 10.4  --   --  8.1 7.9 6.6  NEUTROABS 6.7  --   --   --   --   --   HGB 9.8* 9.9* 10.3* 9.9* 10.2* 10.5*  HCT 31.5*  --   --  31.0* 32.4* 32.0*  MCV 96.6  --   --  94.2 96.4 93.3  PLT 95*  --   --  121* 124* 114*     Cardiac Enzymes: No results for input(s): "CKTOTAL", "CKMB", "CKMBINDEX", "TROPONINI" in the last 168 hours.  BNP: Invalid input(s): "POCBNP"  CBG: Recent Labs  Lab 11/21/22 1237 11/21/22 1634 11/21/22 2149 11/22/22 0819 11/22/22 1203  GLUCAP 133* 128* 184* 107* 135*     Microbiology: Results for orders placed or performed during the hospital encounter of 11/18/22  Blood culture (routine x 2)     Status: None (Preliminary result)   Collection Time: 11/18/22  5:00 PM   Specimen: BLOOD  Result Value Ref Range Status   Specimen Description BLOOD BLOOD LEFT ARM  Final   Special Requests   Final    BOTTLES DRAWN AEROBIC AND ANAEROBIC Blood Culture  results may not be optimal due to an excessive volume of blood received in culture bottles   Culture   Final    NO GROWTH 4 DAYS Performed at [facility] [address] [place], [place] [address]    Report Status PENDING  Incomplete  Aerobic Culture w Gram Stain (superficial specimen)     Status: None   Collection Time: 11/18/22  5:00 PM   Specimen: Wound  Result Value Ref Range Status   Specimen Description   Final    WOUND Performed at [facility], [address], [place], [place] [address]    Special Requests   Final    HDF Performed at [facility], [address] [place], [place] [address]    Gram Stain   Final    FEW GRAM POSITIVE COCCI IN PAIRS IN CLUSTERS RARE WBC PRESENT, PREDOMINANTLY PMN RARE SQUAMOUS EPITHELIAL CELLS PRESENT Performed at  [facility], [place] [address], [place], [place] [address]    Culture   Final    ABUNDANT METHICILLIN RESISTANT STAPHYLOCOCCUS AUREUS   Report Status 11/20/2022 FINAL  Final   Organism ID, Bacteria METHICILLIN RESISTANT STAPHYLOCOCCUS AUREUS  Final      Susceptibility   Methicillin resistant staphylococcus aureus - MIC*    CIPROFLOXACIN >=8 RESISTANT Resistant     ERYTHROMYCIN >=8 RESISTANT Resistant     GENTAMICIN <=0.5 SENSITIVE Sensitive     OXACILLIN >=4 RESISTANT Resistant     TETRACYCLINE <=1 SENSITIVE Sensitive     VANCOMYCIN <=0.5 SENSITIVE Sensitive     TRIMETH/SULFA <=10 SENSITIVE Sensitive     CLINDAMYCIN <=0.25 SENSITIVE Sensitive     RIFAMPIN <=0.5 SENSITIVE Sensitive     Inducible Clindamycin NEGATIVE Sensitive     * ABUNDANT METHICILLIN RESISTANT STAPHYLOCOCCUS AUREUS  Blood culture (routine x 2)     Status: None (Preliminary result)   Collection Time: 11/19/22  5:52 AM   Specimen: BLOOD  Result Value Ref Range Status   Specimen Description BLOOD BLOOD LEFT ARM  Final   Special Requests   Final    BOTTLES DRAWN AEROBIC AND ANAEROBIC Blood Culture adequate volume   Culture   Final    NO GROWTH 3 DAYS Performed at [facility], [address] [place], [place] [address]    Report Status PENDING  Incomplete  MRSA Next Gen by PCR, Nasal     Status: Abnormal   Collection Time: 11/21/22 10:45 AM   Specimen: Nasal Mucosa; Nasal Swab  Result Value Ref Range Status   MRSA by PCR Next Gen DETECTED (A) NOT DETECTED Final    Comment: RESULT CALLED TO, READ BACK BY AND VERIFIED WITH: RAMONE MAIZA 11/21/22 1303 MW (NOTE) The GeneXpert MRSA Assay (FDA approved for NASAL specimens only), is one component of a comprehensive MRSA colonization surveillance program. It is not intended to diagnose MRSA infection nor to guide or monitor treatment for MRSA infections. Test performance is not FDA approved in patients less than 18 years old. Performed at [facility], [address] [place], [place] [address]     Coagulation Studies: No results for input(s): "LABPROT", "INR" in the last 72 hours.   Urinalysis: No results for input(s): "COLORURINE", "LABSPEC", "PHURINE", "GLUCOSEU", "HGBUR", "BILIRUBINUR", "KETONESUR", "PROTEINUR", "UROBILINOGEN", "NITRITE", "LEUKOCYTESUR" in the last 72 hours.  Invalid input(s): "APPERANCEUR"    Imaging: PERIPHERAL VASCULAR CATHETERIZATION  Result Date: 11/20/2022 See surgical note for result.    Medications:    vancomycin Stopped (11/20/22 1308)  amLODipine  10 mg Oral BH-q7a   calcitRIOL  0.75 mcg Oral Q M,W,F   Chlorhexidine Gluconate Cloth  6 each Topical Daily   cinacalcet  30 mg Oral Q M,W,F   heparin injection (subcutaneous)  5,000 Units Subcutaneous Q8H   insulin aspart  0-5 Units Subcutaneous QHS   insulin aspart  0-6 Units Subcutaneous TID WC   metoprolol tartrate  25 mg Oral BID   pravastatin  20 mg Oral q1800   acetaminophen **OR** acetaminophen, HYDROcodone-acetaminophen, morphine injection, ondansetron **OR** ondansetron (ZOFRAN) IV  Assessment/ Plan:  [name] is a 82 y.o. black male with end stage renal disease on hemodialysis, hypertension, diabetes mellitus type II, diabetic retinopathy, gout, hyperlipidemia, sleep apnea, thrombocytopenia, who is admitted to [facility] on 11/18/2022 for Internal jugular vein thrombosis, right [I82.C11] Internal jugular (IJ) vein thromboembolism, acute, right [I82.C11] Acute deep vein thrombosis (DVT) of axillary vein of right upper extremity [I82.A11]  [facility] M[facility] Right IJ permcath 77.5kg  End stage renal disease: MWF schedule.  Patient will receive scheduled dialysis later today via right chest PermCath.  Complication of dialysis device with right IJ thrombosis. Placed on heparin gtt. Patient was on aspirin at home.  We are in favor of removing current permcath and placing a femoral line for dialysis while awaiting  fistula maturation.  Awaiting vascular surgery plans.  Hypertension with chronic kidney disease:  Current regimen of amlodipine and metoprolol. Patient's enalapril is being held.  Blood pressure remained stable for this patient.  Anemia of chronic kidney disease: with history of thrombocytopenia and now thrombosis: Will obtain updated labs in dialysis.  Secondary Hyperparathyroidism: outpatient labs: elevated PTH 702 and hyperphosphatemia.  Continue patient's cinacalcet and calcitriol.    LOS: 4   4/3/20241:25 PM

## 2022-11-22 NOTE — H&P (View-Only) (Signed)
Progress Note    11/22/2022 2:44 PM 2 Days Post-Op  Subjective:  This is a very nice 81-year-old gentleman who has renal insufficiency.  He previously was receiving renal replacement therapy via right internal jugular TDC.  He had construction of a right brachiocephalic fistula on 26 October 2022 by Dr. Dew.  He did well, but then noticed that his arm was getting bigger in size.  He came to the emergency room yesterday for evaluation and treatment.  Ultrasound demonstrated patent brachiocephalic fistula, but thrombus within the internal jugular vein that was partially occlusive adjacent to the TDC in place, subclavian vein appeared to be widely patent, but axillary thrombus likely affecting the outflow tract of the arm and the AV fistula. 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   On exam this morning the patient rests comfortably in bed. Right upper arm remains edematous +4. Patient endorses pain upon movement. He is now POD #2 from right upper extremity venogram and superior venacavogram. There is no thrombus in the right brachial, axillary, subclavian, or innominate veins. These veins were relatively normal down to the innominate vein and then there was an occlusion of the superior vena cava and most proximal innominate vein around his existing PermCath. No complaints overnight. Vitals all remain stable.    Vitals:   11/22/22 1416 11/22/22 1430  BP: (!) 127/53 (!) 118/53  Pulse: 71 73  Resp: 15 18  Temp:    SpO2: 96% 98%   Physical Exam: Cardiac:  RRR No murmurs, rubs or gallops.  Lungs:  Clear to auscultation. Respiratory effort normal.  Incisions:  Right brachial insertion site. Dressing clean dry and intact.  Extremities:  Right upper extremity +4 edema. Unable to palpate pulse due to edema.  Abdomen:  Abdomen is nondistended, soft and nontender. Normal bowel sounds heard.  Neurologic: Judgement and insight appear normal. Mood & affect  appropriate.   CBC    Component Value Date/Time   WBC 6.6 11/21/2022 0425   RBC 3.43 (L) 11/21/2022 0425   HGB 10.5 (L) 11/21/2022 0425   HGB 10.7 (L) 12/26/2013 1035   HCT 32.0 (L) 11/21/2022 0425   HCT 32.5 (L) 12/26/2013 1035   PLT 114 (L) 11/21/2022 0425   PLT 142 (L) 12/26/2013 1035   MCV 93.3 11/21/2022 0425   MCV 90 12/26/2013 1035   MCH 30.6 11/21/2022 0425   MCHC 32.8 11/21/2022 0425   RDW 15.5 11/21/2022 0425   RDW 13.9 12/26/2013 1035   LYMPHSABS 2.2 11/18/2022 1700   LYMPHSABS 1.2 12/26/2013 1035   MONOABS 1.0 11/18/2022 1700   MONOABS 0.6 12/26/2013 1035   EOSABS 0.5 11/18/2022 1700   EOSABS 0.5 12/26/2013 1035   BASOSABS 0.0 11/18/2022 1700   BASOSABS 0.1 12/26/2013 1035    BMET    Component Value Date/Time   NA 138 11/22/2022 1412   NA 141 10/14/2012 0518   K 3.8 11/22/2022 1412   K 4.0 10/14/2012 0518   CL 98 11/22/2022 1412   CL 111 (H) 10/14/2012 0518   CO2 25 11/22/2022 1412   CO2 20 (L) 10/14/2012 0518   GLUCOSE 108 (H) 11/22/2022 1412   GLUCOSE 171 (H) 10/14/2012 0518   BUN 58 (H) 11/22/2022 1412   BUN 44 (H) 10/14/2012 0518   CREATININE 8.97 (H) 11/22/2022 1412   CREATININE 2.54 (H) 10/14/2012 0518   CALCIUM 9.8 11/22/2022 1412   CALCIUM 9.2 10/14/2012 0518   GFRNONAA 5 (L) 11/22/2022 1412     GFRNONAA 25 (L) 10/14/2012 0518   GFRAA 10 (L) 06/17/2018 1132   GFRAA 29 (L) 10/14/2012 0518    INR    Component Value Date/Time   INR 1.4 (H) 11/18/2022 1756    No intake or output data in the 24 hours ending 11/22/22 1444   Assessment/Plan:  81 y.o. male is s/p right upper extremity venogram and superior venacavogram.  2 Days Post-Op   PLAN: Patient will be taken back to the vascular lab on Friday 11/24/22 to remove the dialysis catheter in the patients chest which is causing the occlusion and replacing it in a femoral location due to the need for dialysis now. We will then let the edema subside and let the A/V fistula heal for another 2-4  weeks before using and then removing the femoral dialysis perma catheter.   DVT prophylaxis:  Heparin 5000 units SQ every 8 hours    Aloura Matsuoka R [facility]il[facility] 11/22/2022 2:44 PM   

## 2022-11-22 NOTE — Progress Notes (Signed)
Hemodialysis note  Received patient in bed to unit. Alert and oriented.  Informed consent signed and in chart.  Treatment initiated: 1416 Treatment completed: 1748 Patient tolerated well. Transported back to room, alert without acute distress.  Report given to patient's RN.   Access used: Right HD catheter Access issues: none  Total UF removed: 2.5L Medication(s) given:  Vancomycin 750 mg IV  Arizona Constable RN Kidney D[facility]

## 2022-11-22 NOTE — TOC Initial Note (Signed)
Transition of Care [facility]) - Initial/Assessment Note    Patient Details  Name: [name] [name] MRN: [id number] Date of Birth: [birth year]  [facility] of [facility]) CM/SW Contact:    Laurena Slimmer, RN Phone Number: 11/22/2022, 2:52 PM  Clinical Narrative:                 Case reviewed for DME needs and changes in discharge disposition.         Patient Goals and CMS Choice            Expected Discharge Plan and Services                                              Prior Living Arrangements/Services                       Activities of Daily Living Home Assistive Devices/Equipment: Cane (specify quad or straight), Eyeglasses (straight) ADL Screening (condition at time of admission) Patient's cognitive ability adequate to safely complete daily activities?: Yes Is the patient deaf or have difficulty hearing?: No Does the patient have difficulty seeing, even when wearing glasses/contacts?: No Does the patient have difficulty concentrating, remembering, or making decisions?: No Patient able to express need for assistance with ADLs?: Yes Does the patient have difficulty dressing or bathing?: No Independently performs ADLs?: No Communication: Independent Dressing (OT): Needs assistance (due to infection of right arm) Is this a change from baseline?: Pre-admission baseline (due to infection of right arm; daughter has been helping patient) Grooming: Needs assistance Is this a change from baseline?: Pre-admission baseline (due to infection of right arm; daughter has been helping patient) Feeding: Independent Bathing: Needs assistance Is this a change from baseline?: Pre-admission baseline (due to infection of right arm; daughter has been helping patient) Toileting: Independent In/Out Bed: Independent Walks in Home: Independent Does the patient have difficulty walking or climbing stairs?: No Weakness of Legs: Both Weakness of Arms/Hands: None  Permission  Sought/Granted                  Emotional Assessment              Admission diagnosis:  Internal jugular vein thrombosis, right [id number] Internal jugular (IJ) vein thromboembolism, acute, right [I82.C11] Acute deep vein thrombosis (DVT) of axillary vein of right upper extremity [I82.A11] Patient Active Problem List   Diagnosis Date Noted   Internal jugular (IJ) vein thromboembolism, acute, right 11/18/2022   Postoperative complication of skin involving drainage from surgical wound from right arm AVF placement 11/18/2022   Cellulitis of right arm 11/18/2022   S/P arteriovenous (AV) fistula creation 10/26/22 11/18/2022   ESRD (end stage renal disease) on dialysis 04/14/2022   Gout 04/14/2022   Acute lower UTI 04/14/2022   Peritoneal dialysis catheter dysfunction 04/14/2022   Acute on chronic anemia 04/05/2022   PD catheter dysfunction    Hypokalemia 12/19/2021   ESRD on dialysis    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    Chronic kidney disease, stage V 09/20/2021   Seasonal allergic rhinitis due to pollen 12/15/2020   Ventral hernia without obstruction or gangrene 12/15/2020   Proteinuria 06/14/2019   Secondary hyperparathyroidism of renal origin 06/14/2019   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   Class 1 obesity due to excess calories with serious comorbidity and body mass index (BMI) of  34.0 to 34.9 in adult 03/28/2019   Benign essential hypertension 03/28/2019   History of gout 03/28/2019   Mixed hyperlipidemia 03/28/2019   Diabetes mellitus, type II 03/28/2019   Essential thrombocytopenia 06/28/2016   Thrombocytopenia 05/07/2016   Chronic kidney disease, stage IV (severe) 05/07/2016   PCP:  Dion Body, MD Pharmacy:   [facility] [address] ([address]), [place]) [place] [address] Phone: [phone] Fax: [phone]     Social Determinants of Health (SDOH) Social  History: SDOH Screenings   Food Insecurity: No Food Insecurity (11/19/2022)  Housing: Low Risk  (11/19/2022)  Transportation Needs: No Transportation Needs (11/19/2022)  Utilities: Not At Risk (11/19/2022)  Tobacco Use: Low Risk  (11/21/2022)   SDOH Interventions:     Readmission Risk Interventions     No data to display

## 2022-11-22 NOTE — Progress Notes (Signed)
PROGRESS NOTE    [name] [name]  [id number] DOB: 1940-12-17 DOA: 11/18/2022 PCP: Dion Body, MD  Assessment & Plan:   Principal Problem:   Internal jugular (IJ) vein thromboembolism, acute, right Active Problems:   Postoperative complication of skin involving drainage from surgical wound from right arm AVF placement   Cellulitis of right arm   S/P arteriovenous (AV) fistula creation 10/26/22   Acute on chronic anemia   ESRD (end stage renal disease) on dialysis   Anemia in chronic kidney disease   Essential thrombocytopenia   Benign essential hypertension   Diabetes mellitus, type II   Gout  Assessment and Plan:  R/o acute right internal jugular vein and axillary vein thrombosis: as per vasc surg on 11/20/22. IV heparin drip was d/c by vasc surg. S/p permacath placement into right innominate vein on 11/20/22 as per vasc surg. Vasc surg following and recs apprec   Right antecubital fossa wound infection: from right AV fistula site. Wound culture grew MRSA.  Continue IV vancomycin. Norco, morphine prn for pain       DM2: w/ hypoglycemic episodes. S/p hypoglycemic event on 11/20/2022 which occurred when he was NPO while awaiting vascular procedure. No hypoglycemic episodes so far today    ESRD: on HD MWF. Management as per nephro    ACD: likely secondary to ESRD. No need for a transfusion currently      DVT prophylaxis: heparin SQ Code Status: full  Family Communication:  Disposition Plan: likely d/c back home  Level of care: Progressive  Status is: Inpatient Remains inpatient appropriate because: severity of illness    Consultants:  Vasc surg Nephro   Procedures:   Antimicrobials: vanco    Subjective: Pt c/o right arm swelling  Objective: Vitals:   11/21/22 2027 11/21/22 2352 11/22/22 0352 11/22/22 0818  BP: (!) 107/51 (!) 119/52 (!) 133/55 (!) 82/50  Pulse: 82 65 72 70  Resp: 18 18 18 18   Temp: 97.7 F (36.5 C) 97.8 F (36.6 C) 98 F (36.7 C)  97.8 F (36.6 C)  TempSrc:      SpO2: 96% 100% 93% 94%  Weight:   77.4 kg   Height:       No intake or output data in the 24 hours ending 11/22/22 0854 Filed Weights   11/20/22 1320 11/20/22 1636 11/22/22 0352  Weight: 77.1 kg 77.1 kg 77.4 kg    Examination:  General exam: Appears calm and comfortable  Respiratory system: Clear to auscultation. Respiratory effort normal. Cardiovascular system: S1 & S2+. No rubs, gallops or clicks. Gastrointestinal system: Abdomen is nondistended, soft and nontender.  Normal bowel sounds heard. Central nervous system: Alert and oriented. Moves all extremities  Psychiatry: Judgement and insight appear normal. Mood & affect appropriate.     Data Reviewed: I have personally reviewed following labs and imaging studies  CBC: Recent Labs  Lab 11/18/22 1700 11/19/22 0108 11/19/22 0552 11/19/22 2359 11/20/22 0813 11/21/22 0425  WBC 10.4  --   --  8.1 7.9 6.6  NEUTROABS 6.7  --   --   --   --   --   HGB 9.8* 9.9* 10.3* 9.9* 10.2* 10.5*  HCT 31.5*  --   --  31.0* 32.4* 32.0*  MCV 96.6  --   --  94.2 96.4 93.3  PLT 95*  --   --  121* 124* [id number]*   Basic Metabolic Panel: Recent Labs  Lab 11/18/22 1700 11/20/22 0813 11/21/22 0425  NA 136 139 137  K  4.3 5.1 4.0  CL 99 102 99  CO2 24 23 24   GLUCOSE 137* 108* 101*  BUN 69* 82* 40*  CREATININE 9.45* 10.42* 6.36*  CALCIUM 9.4 9.4 9.8  PHOS  --  6.4*  --    GFR: Estimated Creatinine Clearance: 9.3 mL/min (A) (by C-G formula based on SCr of 6.36 mg/dL (H)). Liver Function Tests: Recent Labs  Lab 11/18/22 1700 11/20/22 0813  AST 11*  --   ALT 7  --   ALKPHOS 74  --   BILITOT 0.7  --   PROT 7.2  --   ALBUMIN 3.4* 2.8*   No results for input(s): "LIPASE", "AMYLASE" in the last 168 hours. No results for input(s): "AMMONIA" in the last 168 hours. Coagulation Profile: Recent Labs  Lab 11/18/22 1756  INR 1.4*   Cardiac Enzymes: No results for input(s): "CKTOTAL", "CKMB",  "CKMBINDEX", "TROPONINI" in the last 168 hours. BNP (last 3 results) No results for input(s): "PROBNP" in the last 8760 hours. HbA1C: No results for input(s): "HGBA1C" in the last 72 hours. CBG: Recent Labs  Lab 11/21/22 0750 11/21/22 1237 11/21/22 1634 11/21/22 2149 11/22/22 0819  GLUCAP 91 133* 128* 184* 107*   Lipid Profile: No results for input(s): "CHOL", "HDL", "LDLCALC", "TRIG", "CHOLHDL", "LDLDIRECT" in the last 72 hours. Thyroid Function Tests: No results for input(s): "TSH", "T4TOTAL", "FREET4", "T3FREE", "THYROIDAB" in the last 72 hours. Anemia Panel: No results for input(s): "VITAMINB12", "FOLATE", "FERRITIN", "TIBC", "IRON", "RETICCTPCT" in the last 72 hours. Sepsis Labs: Recent Labs  Lab 11/18/22 1700  PROCALCITON 19.94    Recent Results (from the past 240 hour(s))  Blood culture (routine x 2)     Status: None (Preliminary result)   Collection Time: 11/18/22  5:00 PM   Specimen: BLOOD  Result Value Ref Range Status   Specimen Description BLOOD BLOOD LEFT ARM  Final   Special Requests   Final    BOTTLES DRAWN AEROBIC AND ANAEROBIC Blood Culture results may not be optimal due to an excessive volume of blood received in culture bottles   Culture   Final    NO GROWTH 3 DAYS Performed at [facility], [address], [place], [place] [address]    Report Status PENDING  Incomplete  Aerobic Culture w Gram Stain (superficial specimen)     Status: None   Collection Time: 11/18/22  5:00 PM   Specimen: Wound  Result Value Ref Range Status   Specimen Description   Final    WOUND Performed at [facility], [address] [place], [place] [address]    Special Requests   Final    HDF Performed at [facility], [address] [place], [place] [address]    Gram Stain   Final    FEW GRAM POSITIVE COCCI IN PAIRS IN CLUSTERS RARE WBC PRESENT, PREDOMINANTLY PMN RARE SQUAMOUS EPITHELIAL CELLS PRESENT Performed at [facility], [place] [address], [place], [place] [address]    Culture   Final    ABUNDANT METHICILLIN RESISTANT STAPHYLOCOCCUS AUREUS   Report Status 11/20/2022 FINAL  Final   Organism ID, Bacteria METHICILLIN RESISTANT STAPHYLOCOCCUS AUREUS  Final      Susceptibility   Methicillin resistant staphylococcus aureus - MIC*    CIPROFLOXACIN >=8 RESISTANT Resistant     ERYTHROMYCIN >=8 RESISTANT Resistant     GENTAMICIN <=0.5 SENSITIVE Sensitive     OXACILLIN >=4 RESISTANT Resistant     TETRACYCLINE <=1 SENSITIVE Sensitive  VANCOMYCIN <=0.5 SENSITIVE Sensitive     TRIMETH/SULFA <=10 SENSITIVE Sensitive     CLINDAMYCIN <=0.25 SENSITIVE Sensitive     RIFAMPIN <=0.5 SENSITIVE Sensitive     Inducible Clindamycin NEGATIVE Sensitive     * ABUNDANT METHICILLIN RESISTANT STAPHYLOCOCCUS AUREUS  Blood culture (routine x 2)     Status: None (Preliminary result)   Collection Time: 11/19/22  5:52 AM   Specimen: BLOOD  Result Value Ref Range Status   Specimen Description BLOOD BLOOD LEFT ARM  Final   Special Requests   Final    BOTTLES DRAWN AEROBIC AND ANAEROBIC Blood Culture adequate volume   Culture   Final    NO GROWTH 2 DAYS Performed at [facility], [address] [place], [place] [address]    Report Status PENDING  Incomplete  MRSA Next Gen by PCR, Nasal     Status: Abnormal   Collection Time: 11/21/22 10:45 AM   Specimen: Nasal Mucosa; Nasal Swab  Result Value Ref Range Status   MRSA by PCR Next Gen DETECTED (A) NOT DETECTED Final    Comment: RESULT CALLED TO, READ BACK BY AND VERIFIED WITH: RAMONE MAIZA 11/21/22 1303 MW (NOTE) The GeneXpert MRSA Assay (FDA approved for NASAL specimens only), is one component of a comprehensive MRSA colonization surveillance program. It is not intended to diagnose MRSA infection nor to guide or monitor treatment for MRSA infections. Test performance is not FDA approved in patients less than 71 years old. Performed at [facility], [address], [place], [place] [address]          Radiology Studies: PERIPHERAL VASCULAR CATHETERIZATION  Result Date: 11/20/2022 See surgical note for result.       Scheduled Meds:  amLODipine  10 mg Oral BH-q7a   calcitRIOL  0.75 mcg Oral Q M,W,F   Chlorhexidine Gluconate Cloth  6 each Topical Daily   cinacalcet  30 mg Oral Q M,W,F   insulin aspart  0-5 Units Subcutaneous QHS   insulin aspart  0-6 Units Subcutaneous TID WC   metoprolol tartrate  25 mg Oral BID   pravastatin  20 mg Oral q1800   Continuous Infusions:  vancomycin Stopped (11/20/22 1308)     LOS: 4 days    Time spent: 25 mins     Wyvonnia Dusky, MD [facility] Pager [phone]  If 7PM-7AM, please contact night-coverage [email or web address] 11/22/2022, 8:54 AM

## 2022-11-23 ENCOUNTER — Other Ambulatory Visit: Payer: Medicare Other

## 2022-11-23 ENCOUNTER — Encounter (INDEPENDENT_AMBULATORY_CARE_PROVIDER_SITE_OTHER): Payer: Medicare Other

## 2022-11-23 ENCOUNTER — Ambulatory Visit (INDEPENDENT_AMBULATORY_CARE_PROVIDER_SITE_OTHER): Payer: Medicare Other | Admitting: Nurse Practitioner

## 2022-11-23 DIAGNOSIS — N186 End stage renal disease: Secondary | ICD-10-CM | POA: Diagnosis not present

## 2022-11-23 DIAGNOSIS — E1169 Type 2 diabetes mellitus with other specified complication: Secondary | ICD-10-CM | POA: Diagnosis not present

## 2022-11-23 DIAGNOSIS — L089 Local infection of the skin and subcutaneous tissue, unspecified: Secondary | ICD-10-CM | POA: Diagnosis not present

## 2022-11-23 DIAGNOSIS — S50811A Abrasion of right forearm, initial encounter: Secondary | ICD-10-CM | POA: Diagnosis not present

## 2022-11-23 LAB — CULTURE, BLOOD (ROUTINE X 2): Culture: NO GROWTH

## 2022-11-23 LAB — CBC
HCT: 30.7 % — ABNORMAL LOW (ref 39.0–52.0)
Hemoglobin: 9.8 g/dL — ABNORMAL LOW (ref 13.0–17.0)
MCH: 30.2 pg (ref 26.0–34.0)
MCHC: 31.9 g/dL (ref 30.0–36.0)
MCV: 94.5 fL (ref 80.0–100.0)
Platelets: 142 10*3/uL — ABNORMAL LOW (ref 150–400)
RBC: 3.25 MIL/uL — ABNORMAL LOW (ref 4.22–5.81)
RDW: 15.3 % (ref 11.5–15.5)
WBC: 8.7 10*3/uL (ref 4.0–10.5)
nRBC: 0 % (ref 0.0–0.2)

## 2022-11-23 LAB — BASIC METABOLIC PANEL
Anion gap: 14 (ref 5–15)
BUN: 35 mg/dL — ABNORMAL HIGH (ref 8–23)
CO2: 27 mmol/L (ref 22–32)
Calcium: 10.2 mg/dL (ref 8.9–10.3)
Chloride: 95 mmol/L — ABNORMAL LOW (ref 98–111)
Creatinine, Ser: 6.33 mg/dL — ABNORMAL HIGH (ref 0.61–1.24)
GFR, Estimated: 8 mL/min — ABNORMAL LOW (ref >60)
Glucose, Bld: 114 mg/dL — ABNORMAL HIGH (ref 70–99)
Potassium: 3.9 mmol/L (ref 3.5–5.1)
Sodium: 136 mmol/L (ref 135–145)

## 2022-11-23 LAB — GLUCOSE, CAPILLARY
Glucose-Capillary: 116 mg/dL — ABNORMAL HIGH (ref 70–99)
Glucose-Capillary: 110 mg/dL — ABNORMAL HIGH (ref 70–99)
Glucose-Capillary: 101 mg/dL — ABNORMAL HIGH (ref 70–99)
Glucose-Capillary: 124 mg/dL — ABNORMAL HIGH (ref 70–99)

## 2022-11-23 MED ORDER — DIPHENHYDRAMINE HCL 50 MG/ML IJ SOLN: 50.0000 mg | Freq: Once | INTRAMUSCULAR | Status: DC | PRN

## 2022-11-23 MED ORDER — CHLORHEXIDINE GLUCONATE CLOTH 2 % EX PADS
6.0000 | MEDICATED_PAD | Freq: Once | CUTANEOUS | Status: AC
  Administered 2022-11-24: 6 via TOPICAL

## 2022-11-23 MED ORDER — SODIUM CHLORIDE 0.9 % IV SOLN: INTRAVENOUS | Status: DC

## 2022-11-23 MED ORDER — CHLORHEXIDINE GLUCONATE CLOTH 2 % EX PADS: 6.0000 | MEDICATED_PAD | Freq: Once | CUTANEOUS | Status: DC

## 2022-11-23 MED ORDER — FENTANYL CITRATE PF 50 MCG/ML IJ SOSY: 12.5000 ug | PREFILLED_SYRINGE | Freq: Once | INTRAMUSCULAR | Status: DC | PRN

## 2022-11-23 MED ORDER — FAMOTIDINE 20 MG PO TABS: 40.0000 mg | ORAL_TABLET | Freq: Once | ORAL | Status: DC | PRN

## 2022-11-23 MED ORDER — MIDAZOLAM HCL 2 MG/ML PO SYRP: 8.0000 mg | ORAL_SOLUTION | Freq: Once | ORAL | Status: DC | PRN

## 2022-11-23 MED ORDER — ONDANSETRON HCL 4 MG/2ML IJ SOLN: 4.0000 mg | Freq: Four times a day (QID) | INTRAMUSCULAR | Status: DC | PRN

## 2022-11-23 MED ORDER — CEFAZOLIN SODIUM-DEXTROSE 2-4 GM/100ML-% IV SOLN
2.0000 g | INTRAVENOUS | Status: DC
  Filled 2022-11-23: qty 100

## 2022-11-23 MED ORDER — METHYLPREDNISOLONE SODIUM SUCC 125 MG IJ SOLR: 125.0000 mg | Freq: Once | INTRAMUSCULAR | Status: DC | PRN

## 2022-11-23 MED ORDER — HYDROMORPHONE HCL 1 MG/ML IJ SOLN: 1.0000 mg | Freq: Once | INTRAMUSCULAR | Status: DC | PRN

## 2022-11-23 NOTE — Progress Notes (Signed)
[facility]  ROUNDING NOTE   Subjective:   [name] was admitted to [facility] on 11/18/2022 for Internal jugular vein thrombosis, right [I82.C11] Internal jugular (IJ) vein thromboembolism, acute, right [I82.C11] Acute deep vein thrombosis (DVT) of axillary vein of right upper extremity [I82.A11]  Patient seen sitting up in bed Alert and oriented Denies pain Currently NPO for procedure RUE remains edematous    Objective:  Vital signs in last 24 hours:  Temp:  [98 F (36.7 C)-98.4 F (36.9 C)] 98 F (36.7 C) (04/04 1105) Pulse Rate:  [65-94] 65 (04/04 1105) Resp:  [12-22] 18 (04/04 1105) BP: (96-132)/(50-67) 111/50 (04/04 1105) SpO2:  [95 %-100 %] 100 % (04/04 1105) Weight:  [75.7 kg-78.9 kg] 78.9 kg (04/04 0600)  Weight change: -1.7 kg Filed Weights   11/22/22 0352 11/22/22 1751 11/23/22 0600  Weight: 77.4 kg 75.7 kg 78.9 kg    Intake/Output: I/O last 3 completed shifts: In: -  Out: 2500 [Other:2500]   Intake/Output this shift:  No intake/output data recorded.  Physical Exam: General: NAD  Head: Normocephalic, atraumatic. Moist oral mucosal membranes  Eyes: Anicteric  Lungs:  Clear to auscultation  Heart: Regular rate and rhythm  Abdomen:  Soft, nontender  Extremities:  Right upper extremity ++ edema.  Neurologic: Alert and oriented, moving all four extremities  Skin: No lesions, RUE wound  Access: Right IJ permcath, right arm AVF with edema    Basic Metabolic Panel: Recent Labs  Lab 11/18/22 1700 11/20/22 0813 11/21/22 0425 11/22/22 1412 11/23/22 1029  NA 136 139 137 138 136  K 4.3 5.1 4.0 3.8 3.9  CL 99 102 99 98 95*  CO2 24 23 24 25 27   GLUCOSE 137* 108* 101* 108* 114*  BUN 69* 82* 40* 58* 35*  CREATININE 9.45* 10.42* 6.36* 8.97* 6.33*  CALCIUM 9.4 9.4 9.8 9.8 10.2  PHOS  --  6.4*  --  6.5*  --      Liver Function Tests: Recent Labs  Lab 11/18/22 1700 11/20/22 0813 11/22/22 1412  AST 11*  --   --   ALT 7  --   --    ALKPHOS 74  --   --   BILITOT 0.7  --   --   PROT 7.2  --   --   ALBUMIN 3.4* 2.8* 2.8*    No results for input(s): "LIPASE", "AMYLASE" in the last 168 hours. No results for input(s): "AMMONIA" in the last 168 hours.  CBC: Recent Labs  Lab 11/18/22 1700 11/19/22 0108 11/19/22 2359 11/20/22 0813 11/21/22 0425 11/22/22 1412 11/23/22 1029  WBC 10.4  --  8.1 7.9 6.6 7.7 8.7  NEUTROABS 6.7  --   --   --   --   --   --   HGB 9.8*   < > 9.9* 10.2* 10.5* 9.3* 9.8*  HCT 31.5*  --  31.0* 32.4* 32.0* 28.7* 30.7*  MCV 96.6  --  94.2 96.4 93.3 93.8 94.5  PLT 95*  --  121* 124* 114* 124* 142*   < > = values in this interval not displayed.     Cardiac Enzymes: No results for input(s): "CKTOTAL", "CKMB", "CKMBINDEX", "TROPONINI" in the last 168 hours.  BNP: Invalid input(s): "POCBNP"  CBG: Recent Labs  Lab 11/22/22 1203 11/22/22 1826 11/22/22 2100 11/23/22 0741 11/23/22 1107  GLUCAP 135* 92 135* 116* 110*     Microbiology: Results for orders placed or performed during the hospital encounter of 11/18/22  Blood culture (  routine x 2)     Status: None (Preliminary result)   Collection Time: 11/18/22  5:00 PM   Specimen: BLOOD  Result Value Ref Range Status   Specimen Description BLOOD BLOOD LEFT ARM  Final   Special Requests   Final    BOTTLES DRAWN AEROBIC AND ANAEROBIC Blood Culture results may not be optimal due to an excessive volume of blood received in culture bottles   Culture   Final    NO GROWTH 4 DAYS Performed at [facility], [address], [place], [place] [address]    Report Status PENDING  Incomplete  Aerobic Culture w Gram Stain (superficial specimen)     Status: None   Collection Time: 11/18/22  5:00 PM   Specimen: Wound  Result Value Ref Range Status   Specimen Description   Final    WOUND Performed at [facility], [address] [place], [place] [address]    Special Requests   Final    HDF Performed at [facility], [address] [place], [place] [address]    Gram Stain   Final    FEW GRAM POSITIVE COCCI IN PAIRS IN CLUSTERS RARE WBC PRESENT, PREDOMINANTLY PMN RARE SQUAMOUS EPITHELIAL CELLS PRESENT Performed at [facility], [place] [address] [place], [place] [address]    Culture   Final    ABUNDANT METHICILLIN RESISTANT STAPHYLOCOCCUS AUREUS   Report Status 11/20/2022 FINAL  Final   Organism ID, Bacteria METHICILLIN RESISTANT STAPHYLOCOCCUS AUREUS  Final      Susceptibility   Methicillin resistant staphylococcus aureus - MIC*    CIPROFLOXACIN >=8 RESISTANT Resistant     ERYTHROMYCIN >=8 RESISTANT Resistant     GENTAMICIN <=0.5 SENSITIVE Sensitive     OXACILLIN >=4 RESISTANT Resistant     TETRACYCLINE <=1 SENSITIVE Sensitive     VANCOMYCIN <=0.5 SENSITIVE Sensitive     TRIMETH/SULFA <=10 SENSITIVE Sensitive     CLINDAMYCIN <=0.25 SENSITIVE Sensitive     RIFAMPIN <=0.5 SENSITIVE Sensitive     Inducible Clindamycin NEGATIVE Sensitive     * ABUNDANT METHICILLIN RESISTANT STAPHYLOCOCCUS AUREUS  Blood culture (routine x 2)     Status: None (Preliminary result)   Collection Time: 11/19/22  5:52 AM   Specimen: BLOOD  Result Value Ref Range Status   Specimen Description BLOOD BLOOD LEFT ARM  Final   Special Requests   Final    BOTTLES DRAWN AEROBIC AND ANAEROBIC Blood Culture adequate volume   Culture   Final    NO GROWTH 3 DAYS Performed at [facility], [address] [place], [place] [address]    Report Status PENDING  Incomplete  MRSA Next Gen by PCR, Nasal     Status: Abnormal   Collection Time: 11/21/22 10:45 AM   Specimen: Nasal Mucosa; Nasal Swab  Result Value Ref Range Status   MRSA by PCR Next Gen DETECTED (A) NOT DETECTED Final    Comment: RESULT CALLED TO, READ BACK BY AND VERIFIED WITH: RAMONE MAIZA 11/21/22 1303 MW (NOTE) The GeneXpert MRSA Assay (FDA approved for NASAL specimens only), is one component of a comprehensive MRSA colonization  surveillance program. It is not intended to diagnose MRSA infection nor to guide or monitor treatment for MRSA infections. Test performance is not FDA approved in patients less than 38 years old. Performed at [facility], [address] [place],  [address]     Coagulation Studies: No results for input(s): "LABPROT", "INR" in the last  72 hours.   Urinalysis: No results for input(s): "COLORURINE", "LABSPEC", "PHURINE", "GLUCOSEU", "HGBUR", "BILIRUBINUR", "KETONESUR", "PROTEINUR", "UROBILINOGEN", "NITRITE", "LEUKOCYTESUR" in the last 72 hours.  Invalid input(s): "APPERANCEUR"    Imaging: No results found.   Medications:    vancomycin Stopped (11/22/22 1738)    amLODipine  10 mg Oral BH-q7a   calcitRIOL  0.75 mcg Oral Q M,W,F   Chlorhexidine Gluconate Cloth  6 each Topical Daily   cinacalcet  30 mg Oral Q M,W,F   heparin injection (subcutaneous)  5,000 Units Subcutaneous Q8H   insulin aspart  0-5 Units Subcutaneous QHS   insulin aspart  0-6 Units Subcutaneous TID WC   metoprolol tartrate  25 mg Oral BID   pravastatin  20 mg Oral q1800   acetaminophen **OR** acetaminophen, HYDROcodone-acetaminophen, morphine injection, ondansetron **OR** ondansetron (ZOFRAN) IV  Assessment/ Plan:  [name] [name] is a 82 y.o. black male with end stage renal disease on hemodialysis, hypertension, diabetes mellitus type II, diabetic retinopathy, gout, hyperlipidemia, sleep apnea, thrombocytopenia, who is admitted to [facility] on 11/18/2022 for Internal jugular vein thrombosis, right [I82.C11] Internal jugular (IJ) vein thromboembolism, acute, right [I82.C11] Acute deep vein thrombosis (DVT) of axillary vein of right upper extremity [I82.A11]  [facility] M[facility] Right IJ permcath 77.5kg  End stage renal disease: MWF schedule.  Dialysis received yesterday, UF 2.5L achieved. Next treatment scheduled for Friday.  Complication of dialysis device with right IJ thrombosis.  Placed on heparin gtt. Patient was on aspirin at home.  Appreciate vascular surgery removing Rt chest permcath and placing femoral tunneled catheter.   Hypertension with chronic kidney disease:  Current regimen of amlodipine and metoprolol. Patient's enalapril is being held.  Blood pressure acceptable.  Anemia of chronic kidney disease: with history of thrombocytopenia and now thrombosis: Hgb within optimal ranged, platelets improving.  Secondary Hyperparathyroidism: outpatient labs: elevated PTH 702 and hyperphosphatemia.  Continue patient's cinacalcet and calcitriol.    LOS: 5   4/4/20241:09 PM

## 2022-11-23 NOTE — Progress Notes (Addendum)
PROGRESS NOTE    [name] [name]  [id number] DOB: Sep 19, 1940 DOA: 11/18/2022 PCP: Dion Body, MD  Assessment & Plan:   Principal Problem:   Internal jugular (IJ) vein thromboembolism, acute, right Active Problems:   Postoperative complication of skin involving drainage from surgical wound from right arm AVF placement   Cellulitis of right arm   S/P arteriovenous (AV) fistula creation 10/26/22   Acute on chronic anemia   ESRD (end stage renal disease) on dialysis   Anemia in chronic kidney disease   Essential thrombocytopenia   Benign essential hypertension   Diabetes mellitus, type II   Gout  Assessment and Plan:  R/o acute right internal jugular vein and axillary vein thrombosis: as per vasc surg on 11/20/22. IV heparin drip was d/c by vasc surg. S/p permacath placement into right innominate vein on 11/20/22 as per vasc surg. Will go for tunneled femoral cath placement. Vasc surg following and recs apprec    Right antecubital fossa wound infection: from right AV fistula site. Wound culture grew MRSA.  Continue on IV vanco while inpatient. Norco, morphine prn for pain       DM2: w/ hypoglycemic episodes. S/p hypoglycemic event on 11/20/2022 which occurred when he was NPO while awaiting vascular procedure. Hypoglycemic episodes have resolved    ESRD: on HD MWF. Management as per nephro    ACD: likely secondary to ESRD. H&H are stable      DVT prophylaxis: heparin SQ Code Status: full  Family Communication:  Disposition Plan: likely d/c back home  Level of care: Progressive  Status is: Inpatient Remains inpatient appropriate because: severity of illness, femoral cath placement and can likely d/c tomorrow after completion of HD     Consultants:  Vasc surg Nephro   Procedures:   Antimicrobials: vanco    Subjective: Pt c/o fatigue   Objective: Vitals:   11/22/22 2313 11/23/22 0344 11/23/22 0600 11/23/22 0744  BP: (!) 118/52 (!) 124/57 (!) 117/51 (!)  117/50  Pulse: 87 77 81 78  Resp: 18 18  20   Temp: 98 F (36.7 C) 98.3 F (36.8 C) 98.2 F (36.8 C) 98 F (36.7 C)  TempSrc:   Oral   SpO2: 98% 97% 98% 97%  Weight:   78.9 kg   Height:        Intake/Output Summary (Last 24 hours) at 11/23/2022 0855 Last data filed at 11/22/2022 1751 Gross per 24 hour  Intake --  Output 2500 ml  Net -2500 ml   Filed Weights   11/22/22 0352 11/22/22 1751 11/23/22 0600  Weight: 77.4 kg 75.7 kg 78.9 kg    Examination:  General exam: Appears comfortable  Respiratory system: clear breath sounds b/l  Cardiovascular system: S1/S2+. No rubs or clicks  Gastrointestinal system: Abd is soft, NT, ND & normal bowel sounds  Central nervous system: Alert and oriented. Moves all extremities  Psychiatry: Judgement and insight appear at baseline. Flat mood and affect    Data Reviewed: I have personally reviewed following labs and imaging studies  CBC: Recent Labs  Lab 11/18/22 1700 11/19/22 0108 11/19/22 0552 11/19/22 2359 11/20/22 0813 11/21/22 0425 11/22/22 1412  WBC 10.4  --   --  8.1 7.9 6.6 7.7  NEUTROABS 6.7  --   --   --   --   --   --   HGB 9.8*   < > 10.3* 9.9* 10.2* 10.5* 9.3*  HCT 31.5*  --   --  31.0* 32.4* 32.0* 28.7*  MCV 96.6  --   --  94.2 96.4 93.3 93.8  PLT 95*  --   --  121* 124* 114* 124*   < > = values in this interval not displayed.   Basic Metabolic Panel: Recent Labs  Lab 11/18/22 1700 11/20/22 0813 11/21/22 0425 11/22/22 1412  NA 136 139 137 138  K 4.3 5.1 4.0 3.8  CL 99 102 99 98  CO2 24 23 24 25   GLUCOSE 137* 108* 101* 108*  BUN 69* 82* 40* 58*  CREATININE 9.45* 10.42* 6.36* 8.97*  CALCIUM 9.4 9.4 9.8 9.8  PHOS  --  6.4*  --  6.5*   GFR: Estimated Creatinine Clearance: 6.6 mL/min (A) (by C-G formula based on SCr of 8.97 mg/dL (H)). Liver Function Tests: Recent Labs  Lab 11/18/22 1700 11/20/22 0813 11/22/22 1412  AST 11*  --   --   ALT 7  --   --   ALKPHOS 74  --   --   BILITOT 0.7  --   --    PROT 7.2  --   --   ALBUMIN 3.4* 2.8* 2.8*   No results for input(s): "LIPASE", "AMYLASE" in the last 168 hours. No results for input(s): "AMMONIA" in the last 168 hours. Coagulation Profile: Recent Labs  Lab 11/18/22 1756  INR 1.4*   Cardiac Enzymes: No results for input(s): "CKTOTAL", "CKMB", "CKMBINDEX", "TROPONINI" in the last 168 hours. BNP (last 3 results) No results for input(s): "PROBNP" in the last 8760 hours. HbA1C: No results for input(s): "HGBA1C" in the last 72 hours. CBG: Recent Labs  Lab 11/22/22 0819 11/22/22 1203 11/22/22 1826 11/22/22 2100 11/23/22 0741  GLUCAP 107* 135* 92 135* 116*   Lipid Profile: No results for input(s): "CHOL", "HDL", "LDLCALC", "TRIG", "CHOLHDL", "LDLDIRECT" in the last 72 hours. Thyroid Function Tests: No results for input(s): "TSH", "T4TOTAL", "FREET4", "T3FREE", "THYROIDAB" in the last 72 hours. Anemia Panel: No results for input(s): "VITAMINB12", "FOLATE", "FERRITIN", "TIBC", "IRON", "RETICCTPCT" in the last 72 hours. Sepsis Labs: Recent Labs  Lab 11/18/22 1700  PROCALCITON 19.94    Recent Results (from the past 240 hour(s))  Blood culture (routine x 2)     Status: None (Preliminary result)   Collection Time: 11/18/22  5:00 PM   Specimen: BLOOD  Result Value Ref Range Status   Specimen Description BLOOD BLOOD LEFT ARM  Final   Special Requests   Final    BOTTLES DRAWN AEROBIC AND ANAEROBIC Blood Culture results may not be optimal due to an excessive volume of blood received in culture bottles   Culture   Final    NO GROWTH 4 DAYS Performed at [facility], [address], [place], [place] [address]    Report Status PENDING  Incomplete  Aerobic Culture w Gram Stain (superficial specimen)     Status: None   Collection Time: 11/18/22  5:00 PM   Specimen: Wound  Result Value Ref Range Status   Specimen Description   Final    WOUND Performed at [facility], [address], [place], [place]  [address]    Special Requests   Final    HDF Performed at [facility], [address] [place], [place] [address]    Gram Stain   Final    FEW GRAM POSITIVE COCCI IN PAIRS IN CLUSTERS RARE WBC PRESENT, PREDOMINANTLY PMN RARE SQUAMOUS EPITHELIAL CELLS PRESENT Performed at [facility], [place] [address], [place], [place] [address]    Culture   Final    ABUNDANT METHICILLIN  RESISTANT STAPHYLOCOCCUS AUREUS   Report Status 11/20/2022 FINAL  Final   Organism ID, Bacteria METHICILLIN RESISTANT STAPHYLOCOCCUS AUREUS  Final      Susceptibility   Methicillin resistant staphylococcus aureus - MIC*    CIPROFLOXACIN >=8 RESISTANT Resistant     ERYTHROMYCIN >=8 RESISTANT Resistant     GENTAMICIN <=0.5 SENSITIVE Sensitive     OXACILLIN >=4 RESISTANT Resistant     TETRACYCLINE <=1 SENSITIVE Sensitive     VANCOMYCIN <=0.5 SENSITIVE Sensitive     TRIMETH/SULFA <=10 SENSITIVE Sensitive     CLINDAMYCIN <=0.25 SENSITIVE Sensitive     RIFAMPIN <=0.5 SENSITIVE Sensitive     Inducible Clindamycin NEGATIVE Sensitive     * ABUNDANT METHICILLIN RESISTANT STAPHYLOCOCCUS AUREUS  Blood culture (routine x 2)     Status: None (Preliminary result)   Collection Time: 11/19/22  5:52 AM   Specimen: BLOOD  Result Value Ref Range Status   Specimen Description BLOOD BLOOD LEFT ARM  Final   Special Requests   Final    BOTTLES DRAWN AEROBIC AND ANAEROBIC Blood Culture adequate volume   Culture   Final    NO GROWTH 3 DAYS Performed at [facility], [address] [place], [place] [address]    Report Status PENDING  Incomplete  MRSA Next Gen by PCR, Nasal     Status: Abnormal   Collection Time: 11/21/22 10:45 AM   Specimen: Nasal Mucosa; Nasal Swab  Result Value Ref Range Status   MRSA by PCR Next Gen DETECTED (A) NOT DETECTED Final    Comment: RESULT CALLED TO, READ BACK BY AND VERIFIED WITH: RAMONE MAIZA 11/21/22 1303 MW (NOTE) The GeneXpert MRSA Assay (FDA approved for NASAL specimens  only), is one component of a comprehensive MRSA colonization surveillance program. It is not intended to diagnose MRSA infection nor to guide or monitor treatment for MRSA infections. Test performance is not FDA approved in patients less than 34 years old. Performed at [facility], [address] [place], [place] [address]          Radiology Studies: No results found.      Scheduled Meds:  amLODipine  10 mg Oral BH-q7a   calcitRIOL  0.75 mcg Oral Q M,W,F   Chlorhexidine Gluconate Cloth  6 each Topical Daily   cinacalcet  30 mg Oral Q M,W,F   heparin injection (subcutaneous)  5,000 Units Subcutaneous Q8H   insulin aspart  0-5 Units Subcutaneous QHS   insulin aspart  0-6 Units Subcutaneous TID WC   metoprolol tartrate  25 mg Oral BID   pravastatin  20 mg Oral q1800   Continuous Infusions:  vancomycin Stopped (11/22/22 1738)     LOS: 5 days    Time spent: 25 mins     Wyvonnia Dusky, MD [facility] Pager [phone]  If 7PM-7AM, please contact night-coverage [email or web address] 11/23/2022, 8:55 AM

## 2022-11-23 NOTE — Plan of Care (Signed)

## 2022-11-24 ENCOUNTER — Encounter
Admission: EM | Disposition: A | Discharge status: Home Health | Payer: Self-pay | Source: Home / Self Care | Attending: Internal Medicine

## 2022-11-24 HISTORY — PX: UPPER EXTREMITY VENOGRAPHY: CATH118272

## 2022-11-24 LAB — CULTURE, BLOOD (ROUTINE X 2)
Culture: NO GROWTH
Special Requests: ADEQUATE

## 2022-11-24 LAB — BASIC METABOLIC PANEL
Anion gap: 14 (ref 5–15)
BUN: 45 mg/dL — ABNORMAL HIGH (ref 8–23)
CO2: 25 mmol/L (ref 22–32)
Calcium: 10.1 mg/dL (ref 8.9–10.3)
Chloride: 98 mmol/L (ref 98–111)
Creatinine, Ser: 7.93 mg/dL — ABNORMAL HIGH (ref 0.61–1.24)
GFR, Estimated: 6 mL/min — ABNORMAL LOW (ref >60)
Glucose, Bld: 99 mg/dL (ref 70–99)
Potassium: 3.7 mmol/L (ref 3.5–5.1)
Sodium: 137 mmol/L (ref 135–145)

## 2022-11-24 LAB — CBC
HCT: 30.4 % — ABNORMAL LOW (ref 39.0–52.0)
Hemoglobin: 9.7 g/dL — ABNORMAL LOW (ref 13.0–17.0)
MCH: 30.3 pg (ref 26.0–34.0)
MCHC: 31.9 g/dL (ref 30.0–36.0)
MCV: 95 fL (ref 80.0–100.0)
Platelets: 130 10*3/uL — ABNORMAL LOW (ref 150–400)
RBC: 3.2 MIL/uL — ABNORMAL LOW (ref 4.22–5.81)
RDW: 15.4 % (ref 11.5–15.5)
WBC: 7.9 10*3/uL (ref 4.0–10.5)
nRBC: 0 % (ref 0.0–0.2)

## 2022-11-24 LAB — GLUCOSE, CAPILLARY
Glucose-Capillary: 103 mg/dL — ABNORMAL HIGH (ref 70–99)
Glucose-Capillary: 64 mg/dL — ABNORMAL LOW (ref 70–99)
Glucose-Capillary: 148 mg/dL — ABNORMAL HIGH (ref 70–99)

## 2022-11-24 SURGERY — UPPER EXTREMITY VENOGRAPHY
Anesthesia: Moderate Sedation | Laterality: Right

## 2022-11-24 SURGERY — IVC VENOGRAPHY
Anesthesia: Moderate Sedation

## 2022-11-24 MED ORDER — IODIXANOL 320 MG/ML IV SOLN
INTRAVENOUS | Status: DC | PRN
  Administered 2022-11-24: 10 mL via INTRAVENOUS

## 2022-11-24 MED ORDER — MIDAZOLAM HCL 5 MG/5ML IJ SOLN
INTRAMUSCULAR | Status: AC
  Filled 2022-11-24: qty 5

## 2022-11-24 MED ORDER — HEPARIN SODIUM (PORCINE) 1000 UNIT/ML IJ SOLN
INTRAMUSCULAR | Status: DC | PRN
  Administered 2022-11-24: 3000 [IU] via INTRAVENOUS

## 2022-11-24 MED ORDER — CEFAZOLIN SODIUM-DEXTROSE 1-4 GM/50ML-% IV SOLN
INTRAVENOUS | Status: AC | PRN
  Administered 2022-11-24: 1 g via INTRAVENOUS

## 2022-11-24 MED ORDER — CEFAZOLIN SODIUM-DEXTROSE 1-4 GM/50ML-% IV SOLN: 1.0000 g | INTRAVENOUS | Status: DC

## 2022-11-24 MED ORDER — HEPARIN SODIUM (PORCINE) 1000 UNIT/ML IJ SOLN
INTRAMUSCULAR | Status: AC
  Filled 2022-11-24: qty 10

## 2022-11-24 MED ORDER — MIDAZOLAM HCL 2 MG/2ML IJ SOLN
INTRAMUSCULAR | Status: DC | PRN
  Administered 2022-11-24 (×3): 1 mg via INTRAVENOUS

## 2022-11-24 MED ORDER — MORPHINE SULFATE (PF) 2 MG/ML IV SOLN
INTRAVENOUS | Status: AC
  Filled 2022-11-24: qty 1

## 2022-11-24 MED ORDER — CEFAZOLIN SODIUM-DEXTROSE 1-4 GM/50ML-% IV SOLN
INTRAVENOUS | Status: AC
  Filled 2022-11-24: qty 50

## 2022-11-24 MED ORDER — HEPARIN SODIUM (PORCINE) 10000 UNIT/ML IJ SOLN
INTRAMUSCULAR | Status: AC
  Filled 2022-11-24: qty 1

## 2022-11-24 MED ORDER — FENTANYL CITRATE (PF) 100 MCG/2ML IJ SOLN
INTRAMUSCULAR | Status: AC
  Filled 2022-11-24: qty 2

## 2022-11-24 MED ORDER — FENTANYL CITRATE (PF) 100 MCG/2ML IJ SOLN
INTRAMUSCULAR | Status: DC | PRN
  Administered 2022-11-24: 25 ug via INTRAVENOUS
  Administered 2022-11-24: 50 ug via INTRAVENOUS
  Administered 2022-11-24: 25 ug via INTRAVENOUS

## 2022-11-24 SURGICAL SUPPLY — 13 items
ADH SKN CLS APL DERMABOND .7 (GAUZE/BANDAGES/DRESSINGS) ×2
BALLN DORADO 10X80X80 (BALLOONS) ×1
BALLOON DORADO 10X80X80 (BALLOONS) IMPLANT
CATH PALINDROME-P 44CM KIT (CATHETERS) ×1
COVER PROBE ULTRASOUND 5X96 (MISCELLANEOUS) IMPLANT
DERMABOND ADVANCED .7 DNX12 (GAUZE/BANDAGES/DRESSINGS) IMPLANT
KIT CATH CHRNC PALINDROME PRCS (CATHETERS) IMPLANT
PACK ANGIOGRAPHY (CUSTOM PROCEDURE TRAY) IMPLANT
SHEATH 9FRX11 (SHEATH) IMPLANT
SHEATH BRITE TIP 10FRX11 (SHEATH) IMPLANT
SUT MNCRL AB 4-0 PS2 18 (SUTURE) IMPLANT
SUT PROLENE 0 CT 1 30 (SUTURE) IMPLANT
WIRE MAGIC TOR.035 180C (WIRE) IMPLANT

## 2022-11-24 NOTE — Progress Notes (Signed)
  Received patient in bed to unit.   Informed consent signed and in chart.    TX duration:3.5hrs     Transported back to floor  Hand-off given to patient's nurse.    Access used: R Femoral cath Access issues: none    Total UF removed: 1.5kgs Medication(s) given: vancomycin 750mg  Post HD VS: stable Post HD weight: 71.9kg     Lynann Beaver  [facility]

## 2022-11-24 NOTE — Progress Notes (Signed)
Approx 5-10 minutes after administering 2 mg morphine via IV pt became very upset, tearful and expressing a desire to "be taken by Jesus", "y'all aren't doing anything for me and my wife is at home alone and she has Alzheimer's and she needs help" - staff unable to calm or reason with pt, pt's daughter called in hopes of calming pt but daughter reports that that would make it worse and that the last time IV pain meds were given that pt became upset and confused.  Allergy/intolerance added to chart.

## 2022-11-24 NOTE — Op Note (Signed)
OPERATIVE NOTE    PRE-OPERATIVE DIAGNOSIS: 1. 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  Patent right femoral vein   SPECIMEN(S):  None  INDICATIONS:   Patient is a 81 y.o.male who presents with SVC syndrome around a jugular catheter and this needs to be removed.   The patient needs long term dialysis access for their ESRD, and a Permcath is necessary.  Risks and benefits are discussed and informed consent is obtained.    DESCRIPTION: After obtaining full informed written consent, the patient was brought back to the vascular suited. Moderate conscious sedation was administered throughout the procedure with a face-to-face encounter with my presence for the entire procedure and with my supervision of the RN monitoring the patient's vital signs, pulse oximetry, telemetry, and mental status throughout the procedure. The patient's right groin was sterilely prepped and draped and a sterile surgical field was created.  The right femoral vein was visualized with ultrasound and found to be patent. It was then accessed under direct ultrasound guidance and a permanent image was recorded. A wire was placed. After skin nick and dilatation, the peel-away sheath was placed over the wire. I then turned my attention to an area about 5 cm inferior and lateral to the access incision and a small counterincision was created.  I tunneled from the counter  incision to the access site. Using fluoroscopic guidance, a 44 centimeterer  tip to cuff tunneled hemodialysis catheter was selected, and tunneled from the counter  incision to the access site. It was then placed through the peel-away sheath and the peel-away sheath was removed. Using fluoroscopic guidance the catheter tips were parked in the IVC just below the atrium. The appropriate distal connectors were placed. It withdrew blood well and flushed easily with heparinized saline and a concentrated heparin solution was then placed. It was secured to the leg  with 2 Prolene sutures. The access incision was closed single 4-0 Monocryl. A 4-0 Monocryl pursestring suture was placed around the exit site. Sterile dressings were placed. The patient tolerated the procedure well.  The jugular catheter removal and venogram portion will be dictated separately.   COMPLICATIONS: None  CONDITION: Stable    [name] 11/24/2022 11:46 AM   This note was created with Dragon Medical transcription system. Any errors in dictation are purely unintentional.

## 2022-11-24 NOTE — Consult Note (Signed)
Pharmacy Antibiotic Note  [name] [name] is a 82 y.o. male admitted on 11/18/2022 with internal jugular thromboembolism.  Pharmacy has been consulted for vancomycin dosing for wound infection. Wound culture growing MRSA.     Plan: Day 7 of abx. Pt received vancomycin loading dose of 1750 mg x 1 followed by 750 mg with HD. Vancomycin random was 17 on 4/3. Continue current dose of vancomycin and order vancomycin level 4/10. Plan for tunneled femoral cath placement.   Height: 5\' 9"  (175.3 cm) Weight: 74.5 kg (164 lb 3.2 oz) IBW/kg (Calculated) : 70.7  Temp (24hrs), Avg:98 F (36.7 C), Min:97.8 F (36.6 C), Max:98.2 F (36.8 C)  Recent Labs  Lab 11/20/22 0813 11/21/22 0425 11/22/22 0510 11/22/22 1412 11/23/22 1029 11/24/22 0452  WBC 7.9 6.6  --  7.7 8.7 7.9  CREATININE 10.42* 6.36*  --  8.97* 6.33* 7.93*  VANCORANDOM  --   --  17  --   --   --      Estimated Creatinine Clearance: 7.4 mL/min (A) (by C-G formula based on SCr of 7.93 mg/dL (H)).    Allergies  Allergen Reactions   Fish Allergy Swelling   Ciprofloxacin     Unknown reaction   Doxazosin Other (See Comments)    unknown    Antimicrobials this admission: vancomycin 3/30 >>  cefepime 3/30 >> 4/2 Ceftriaxone 3/30 x 1  Dose adjustments this admission: N/A  Microbiology results: 3/30 BCx: pending 3/30 Wound: MRSA  Thank you for allowing pharmacy to be a part of this patient's care.  Ronnald Ramp, PharmD 11/24/2022 8:49 AM

## 2022-11-24 NOTE — Op Note (Signed)
Operative Note     Preoperative diagnosis:   1. 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  A new catheter was placed in the groin will be dictated in a separate note.  Risks and benefits are discussed and informed consent is obtained.  Description of the Procedure:  The patient's right neck, chest and existing catheter were sterilely prepped and draped. The area around the catheter was anesthetized copiously with 1% lidocaine. The catheter was dissected out with curved hemostats until the cuff was freed from the surrounding fibrous sheath.  I then placed Amplatz Super Stiff wire through the existing catheter and parked this through the right atrium and the inferior vena cava.  The existing PermCath was then removed.  A 10 French sheath was placed in the right jugular vein over the wire.  Selective imaging showed high-grade narrowing of the right jugular and innominate veins tracking down into the proximal superior vena cava.  I elected to treat this area with angioplasty.  2 inflations with a 10 mm diameter by 8 cm length high-pressure angioplasty balloon were performed.  The first was only to 4 atm and the second inflation required higher pressure up in the jugular and innominate vein up to 24 atm to break the waist.  Completion imaging showed marked improvement with less than  20% residual stenosis in the innominate vein and superior vena cava.  There was residual narrowing in the jugular vein at the access site but this should not affect his right arm and his arm swelling should be improved.  The sheath and wire were then removed.  A 4-0 Monocryl pursestring suture was placed from the previous catheter site.  Pressure was held and sterile dressings were placed. The patient tolerated the procedure well and was taken to the recovery room in stable condition.     [name]  11/24/2022, 11:51 AM This note was created with Dragon Medical transcription system. Any errors in dictation are purely unintentional.

## 2022-11-24 NOTE — Progress Notes (Signed)
PROGRESS NOTE    [name] [name]  LFY:[id number] DOB: 1940/09/12 DOA: 11/18/2022 PCP: Marisue Ivan, MD  Assessment & Plan:   Principal Problem:   Internal jugular (IJ) vein thromboembolism, acute, right Active Problems:   Postoperative complication of skin involving drainage from surgical wound from right arm AVF placement   Cellulitis of right arm   S/P arteriovenous (AV) fistula creation 10/26/22   Acute on chronic anemia   ESRD (end stage renal disease) on dialysis   Anemia in chronic kidney disease   Essential thrombocytopenia   Benign essential hypertension   Diabetes mellitus, type II   Gout  Assessment and Plan:  R/o acute right internal jugular vein and axillary vein thrombosis: as per vasc surg on 11/20/22. IV heparin drip was d/c by vasc surg. S/p tunneled femoral cath placement today 11/24/22. Vasc surg following and recs apprec    Right antecubital fossa wound infection: from right AV fistula site. Wound culture grew MRSA. Continue on IV vanco while inpatient. Norco, morphine prn for pain    DM2: w/ hypoglycemic episodes. S/p hypoglycemic event on 11/20/2022 which occurred when he was NPO while awaiting vascular procedure. Hypoglycemic episodes have resolved    ESRD: on HD MWF. Management as per neprho    ACD: likely secondary to ESRD. H&H are stable   Generalized weakness: PT/OT consulted     DVT prophylaxis: heparin SQ Code Status: full  Family Communication: discussed pt's care w/ pt's daughter, Doyne Keel, and answered her questions Disposition Plan: likely d/c back home but therapy will need to see pt prior   Level of care: Progressive  Status is: Inpatient Remains inpatient appropriate because: severity of illness, therapy still needs to see the pt     Consultants:  Vasc surg Nephro   Procedures:   Antimicrobials: vanco    Subjective: Pt c/o malaise   Objective: Vitals:   11/24/22 0005 11/24/22 0405 11/24/22 0626 11/24/22 0830  BP: 119/63  (!) 114/50 (!) 118/52 (!) 117/49  Pulse: 64 68 69 70  Resp: 18 16  18   Temp: 98.2 F (36.8 C) 97.9 F (36.6 C)  98.2 F (36.8 C)  TempSrc:      SpO2: 100% 100%  98%  Weight:  74.5 kg    Height:        Intake/Output Summary (Last 24 hours) at 11/24/2022 0836 Last data filed at 11/24/2022 0630 Gross per 24 hour  Intake 150 ml  Output 200 ml  Net -50 ml   Filed Weights   11/22/22 1751 11/23/22 0600 11/24/22 0405  Weight: 75.7 kg 78.9 kg 74.5 kg    Examination:  General exam: Appears frustrated  Respiratory system: clear breath sounds b/l Cardiovascular system: S1 & S2+. No gallops or rubs  Gastrointestinal system: abd is soft, NT, ND & normal bowel sounds Central nervous system: alert and oriented. Moves all extremities Psychiatry: Judgement and insight appear at baseline. Frustrated mood and affect    Data Reviewed: I have personally reviewed following labs and imaging studies  CBC: Recent Labs  Lab 11/18/22 1700 11/19/22 0108 11/20/22 0813 11/21/22 0425 11/22/22 1412 11/23/22 1029 11/24/22 0452  WBC 10.4   < > 7.9 6.6 7.7 8.7 7.9  NEUTROABS 6.7  --   --   --   --   --   --   HGB 9.8*   < > 10.2* 10.5* 9.3* 9.8* 9.7*  HCT 31.5*   < > 32.4* 32.0* 28.7* 30.7* 30.4*  MCV 96.6   < >  96.4 93.3 93.8 94.5 95.0  PLT 95*   < > 124* 114* 124* 142* 130*   < > = values in this interval not displayed.   Basic Metabolic Panel: Recent Labs  Lab 11/20/22 0813 11/21/22 0425 11/22/22 1412 11/23/22 1029 11/24/22 0452  NA 139 137 138 136 137  K 5.1 4.0 3.8 3.9 3.7  CL 102 99 98 95* 98  CO2 23 24 25 27 25   GLUCOSE 108* 101* 108* 114* 99  BUN 82* 40* 58* 35* 45*  CREATININE 10.42* 6.36* 8.97* 6.33* 7.93*  CALCIUM 9.4 9.8 9.8 10.2 10.1  PHOS 6.4*  --  6.5*  --   --    GFR: Estimated Creatinine Clearance: 7.4 mL/min (A) (by C-G formula based on SCr of 7.93 mg/dL (H)). Liver Function Tests: Recent Labs  Lab 11/18/22 1700 11/20/22 0813 11/22/22 1412  AST 11*  --   --    ALT 7  --   --   ALKPHOS 74  --   --   BILITOT 0.7  --   --   PROT 7.2  --   --   ALBUMIN 3.4* 2.8* 2.8*   No results for input(s): "LIPASE", "AMYLASE" in the last 168 hours. No results for input(s): "AMMONIA" in the last 168 hours. Coagulation Profile: Recent Labs  Lab 11/18/22 1756  INR 1.4*   Cardiac Enzymes: No results for input(s): "CKTOTAL", "CKMB", "CKMBINDEX", "TROPONINI" in the last 168 hours. BNP (last 3 results) No results for input(s): "PROBNP" in the last 8760 hours. HbA1C: No results for input(s): "HGBA1C" in the last 72 hours. CBG: Recent Labs  Lab 11/23/22 0741 11/23/22 1107 11/23/22 1610 11/23/22 2231 11/24/22 0827  GLUCAP 116* 110* 101* 124* 103*   Lipid Profile: No results for input(s): "CHOL", "HDL", "LDLCALC", "TRIG", "CHOLHDL", "LDLDIRECT" in the last 72 hours. Thyroid Function Tests: No results for input(s): "TSH", "T4TOTAL", "FREET4", "T3FREE", "THYROIDAB" in the last 72 hours. Anemia Panel: No results for input(s): "VITAMINB12", "FOLATE", "FERRITIN", "TIBC", "IRON", "RETICCTPCT" in the last 72 hours. Sepsis Labs: Recent Labs  Lab 11/18/22 1700  PROCALCITON 19.94    Recent Results (from the past 240 hour(s))  Blood culture (routine x 2)     Status: None   Collection Time: 11/18/22  5:00 PM   Specimen: BLOOD  Result Value Ref Range Status   Specimen Description BLOOD BLOOD LEFT ARM  Final   Special Requests   Final    BOTTLES DRAWN AEROBIC AND ANAEROBIC Blood Culture results may not be optimal due to an excessive volume of blood received in culture bottles   Culture   Final    NO GROWTH 5 DAYS Performed at [facility], [address] [place], [place] [address]    Report Status 11/23/2022 FINAL  Final  Aerobic Culture w Gram Stain (superficial specimen)     Status: None   Collection Time: 11/18/22  5:00 PM   Specimen: Wound  Result Value Ref Range Status   Specimen Description   Final    WOUND Performed at [facility], [address] [place], [place] [address]    Special Requests   Final    HDF Performed at [facility], [address] [place], [place] [address]    Gram Stain   Final    FEW GRAM POSITIVE COCCI IN PAIRS IN CLUSTERS RARE WBC PRESENT, PREDOMINANTLY PMN RARE SQUAMOUS EPITHELIAL CELLS PRESENT Performed at [facility], [address] [place], [place] [address]  Culture   Final    ABUNDANT METHICILLIN RESISTANT STAPHYLOCOCCUS AUREUS   Report Status 11/20/2022 FINAL  Final   Organism ID, Bacteria METHICILLIN RESISTANT STAPHYLOCOCCUS AUREUS  Final      Susceptibility   Methicillin resistant staphylococcus aureus - MIC*    CIPROFLOXACIN >=8 RESISTANT Resistant     ERYTHROMYCIN >=8 RESISTANT Resistant     GENTAMICIN <=0.5 SENSITIVE Sensitive     OXACILLIN >=4 RESISTANT Resistant     TETRACYCLINE <=1 SENSITIVE Sensitive     VANCOMYCIN <=0.5 SENSITIVE Sensitive     TRIMETH/SULFA <=10 SENSITIVE Sensitive     CLINDAMYCIN <=0.25 SENSITIVE Sensitive     RIFAMPIN <=0.5 SENSITIVE Sensitive     Inducible Clindamycin NEGATIVE Sensitive     * ABUNDANT METHICILLIN RESISTANT STAPHYLOCOCCUS AUREUS  Blood culture (routine x 2)     Status: None   Collection Time: 11/19/22  5:52 AM   Specimen: BLOOD  Result Value Ref Range Status   Specimen Description BLOOD BLOOD LEFT ARM  Final   Special Requests   Final    BOTTLES DRAWN AEROBIC AND ANAEROBIC Blood Culture adequate volume   Culture   Final    NO GROWTH 5 DAYS Performed at [facility], [address], [place], [place] [address]    Report Status 11/24/2022 FINAL  Final  MRSA Next Gen by PCR, Nasal     Status: Abnormal   Collection Time: 11/21/22 10:45 AM   Specimen: Nasal Mucosa; Nasal Swab  Result Value Ref Range Status   MRSA by PCR Next Gen DETECTED (A) NOT DETECTED Final    Comment: RESULT CALLED TO, READ BACK BY AND VERIFIED WITH: RAMONE MAIZA 11/21/22 1303 MW (NOTE) The GeneXpert MRSA Assay  (FDA approved for NASAL specimens only), is one component of a comprehensive MRSA colonization surveillance program. It is not intended to diagnose MRSA infection nor to guide or monitor treatment for MRSA infections. Test performance is not FDA approved in patients less than 22 years old. Performed at [facility], [address], [place], [place] [address]          Radiology Studies: No results found.      Scheduled Meds:  amLODipine  10 mg Oral BH-q7a   calcitRIOL  0.75 mcg Oral Q M,W,F   Chlorhexidine Gluconate Cloth  6 each Topical Daily   Chlorhexidine Gluconate Cloth  6 each Topical Once   cinacalcet  30 mg Oral Q M,W,F   heparin injection (subcutaneous)  5,000 Units Subcutaneous Q8H   insulin aspart  0-5 Units Subcutaneous QHS   insulin aspart  0-6 Units Subcutaneous TID WC   metoprolol tartrate  25 mg Oral BID   pravastatin  20 mg Oral q1800   Continuous Infusions:  sodium chloride      ceFAZolin (ANCEF) IV     vancomycin Stopped (11/22/22 1738)     LOS: 6 days    Time spent: 25 mins     Charise Killian, MD [facility] Pager [phone]  If 7PM-7AM, please contact night-coverage [email or web address] 11/24/2022, 8:36 AM

## 2022-11-24 NOTE — Progress Notes (Signed)
[facility]  ROUNDING NOTE   Subjective:   [name] was admitted to [facility] on 11/18/2022 for Internal jugular vein thrombosis, right [I82.C11] Internal jugular (IJ) vein thromboembolism, acute, right [I82.C11] Acute deep vein thrombosis (DVT) of axillary vein of right upper extremity [I82.A11]  Seen and examined on hemodialysis treatment. Tolerating treatment well.   Patient had his right IJ tunneled catheter removed and right tunneled femoral catheter placed by [facility] this morning.     HEMODIALYSIS FLOWSHEET:  Blood Flow Rate (mL/min): 400 mL/min Arterial Pressure (mmHg): -220 mmHg Venous Pressure (mmHg): 200 mmHg TMP (mmHg): 16 mmHg Ultrafiltration Rate (mL/min): 971 mL/min Dialysate Flow Rate (mL/min): 300 ml/min Dialysis Fluid Bolus: Normal Saline    Objective:  Vital signs in last 24 hours:  Temp:  [97.5 F (36.4 C)-98.2 F (36.8 C)] 97.5 F (36.4 C) (04/05 1305) Pulse Rate:  [64-90] 73 (04/05 1320) Resp:  [11-30] 17 (04/05 1320) BP: (109-143)/(46-63) 125/52 (04/05 1320) SpO2:  [76 %-100 %] 99 % (04/05 1320) Weight:  [73 kg-74.5 kg] 73 kg (04/05 1316)  Weight change: -1.219 kg Filed Weights   11/23/22 0600 11/24/22 0405 11/24/22 1316  Weight: 78.9 kg 74.5 kg 73 kg    Intake/Output: I/O last 3 completed shifts: In: 150 [IV Piggyback:150] Out: 200 [Urine:200]   Intake/Output this shift:  No intake/output data recorded.  Physical Exam: General: NAD  Head: Normocephalic, atraumatic. Moist oral mucosal membranes  Eyes: Anicteric  Lungs:  Clear to auscultation  Heart: Regular rate and rhythm  Abdomen:  Soft, nontender  Extremities:  Right upper extremity ++ edema.  Neurologic: Alert and oriented, moving all four extremities  Skin: No lesions, RUE wound  Access: Right IJ permcath, right arm AVF with edema    Basic Metabolic Panel: Recent Labs  Lab 11/20/22 0813 11/21/22 0425 11/22/22 1412 11/23/22 1029 11/24/22 0452  NA 139 137  138 136 137  K 5.1 4.0 3.8 3.9 3.7  CL 102 99 98 95* 98  CO2 23 24 25 27 25   GLUCOSE 108* 101* 108* 114* 99  BUN 82* 40* 58* 35* 45*  CREATININE 10.42* 6.36* 8.97* 6.33* 7.93*  CALCIUM 9.4 9.8 9.8 10.2 10.1  PHOS 6.4*  --  6.5*  --   --      Liver Function Tests: Recent Labs  Lab 11/18/22 1700 11/20/22 0813 11/22/22 1412  AST 11*  --   --   ALT 7  --   --   ALKPHOS 74  --   --   BILITOT 0.7  --   --   PROT 7.2  --   --   ALBUMIN 3.4* 2.8* 2.8*    No results for input(s): "LIPASE", "AMYLASE" in the last 168 hours. No results for input(s): "AMMONIA" in the last 168 hours.  CBC: Recent Labs  Lab 11/18/22 1700 11/19/22 0108 11/20/22 0813 11/21/22 0425 11/22/22 1412 11/23/22 1029 11/24/22 0452  WBC 10.4   < > 7.9 6.6 7.7 8.7 7.9  NEUTROABS 6.7  --   --   --   --   --   --   HGB 9.8*   < > 10.2* 10.5* 9.3* 9.8* 9.7*  HCT 31.5*   < > 32.4* 32.0* 28.7* 30.7* 30.4*  MCV 96.6   < > 96.4 93.3 93.8 94.5 95.0  PLT 95*   < > 124* 114* 124* 142* 130*   < > = values in this interval not displayed.     Cardiac Enzymes: No results  for input(s): "CKTOTAL", "CKMB", "CKMBINDEX", "TROPONINI" in the last 168 hours.  BNP: Invalid input(s): "POCBNP"  CBG: Recent Labs  Lab 11/23/22 0741 11/23/22 1107 11/23/22 1610 11/23/22 2231 11/24/22 0827  GLUCAP 116* 110* 101* 124* 103*     Microbiology: Results for orders placed or performed during the hospital encounter of 11/18/22  Blood culture (routine x 2)     Status: None   Collection Time: 11/18/22  5:00 PM   Specimen: BLOOD  Result Value Ref Range Status   Specimen Description BLOOD BLOOD LEFT ARM  Final   Special Requests   Final    BOTTLES DRAWN AEROBIC AND ANAEROBIC Blood Culture results may not be optimal due to an excessive volume of blood received in culture bottles   Culture   Final    NO GROWTH 5 DAYS Performed at [facility], [address] [place], [place] [address]    Report Status 11/23/2022  FINAL  Final  Aerobic Culture w Gram Stain (superficial specimen)     Status: None   Collection Time: 11/18/22  5:00 PM   Specimen: Wound  Result Value Ref Range Status   Specimen Description   Final    WOUND Performed at [facility], [address] [place], [place] [address]    Special Requests   Final    HDF Performed at [facility], [address] [place], [place] [address]    Gram Stain   Final    FEW GRAM POSITIVE COCCI IN PAIRS IN CLUSTERS RARE WBC PRESENT, PREDOMINANTLY PMN RARE SQUAMOUS EPITHELIAL CELLS PRESENT Performed at [facility], [address] [place], [place] [address]    Culture   Final    ABUNDANT METHICILLIN RESISTANT STAPHYLOCOCCUS AUREUS   Report Status 11/20/2022 FINAL  Final   Organism ID, Bacteria METHICILLIN RESISTANT STAPHYLOCOCCUS AUREUS  Final      Susceptibility   Methicillin resistant staphylococcus aureus - MIC*    CIPROFLOXACIN >=8 RESISTANT Resistant     ERYTHROMYCIN >=8 RESISTANT Resistant     GENTAMICIN <=0.5 SENSITIVE Sensitive     OXACILLIN >=4 RESISTANT Resistant     TETRACYCLINE <=1 SENSITIVE Sensitive     VANCOMYCIN <=0.5 SENSITIVE Sensitive     TRIMETH/SULFA <=10 SENSITIVE Sensitive     CLINDAMYCIN <=0.25 SENSITIVE Sensitive     RIFAMPIN <=0.5 SENSITIVE Sensitive     Inducible Clindamycin NEGATIVE Sensitive     * ABUNDANT METHICILLIN RESISTANT STAPHYLOCOCCUS AUREUS  Blood culture (routine x 2)     Status: None   Collection Time: 11/19/22  5:52 AM   Specimen: BLOOD  Result Value Ref Range Status   Specimen Description BLOOD BLOOD LEFT ARM  Final   Special Requests   Final    BOTTLES DRAWN AEROBIC AND ANAEROBIC Blood Culture adequate volume   Culture   Final    NO GROWTH 5 DAYS Performed at [facility], [address] [place], [place] [address]    Report Status 11/24/2022 FINAL  Final  MRSA Next Gen by PCR, Nasal     Status: Abnormal   Collection Time: 11/21/22 10:45 AM   Specimen:  Nasal Mucosa; Nasal Swab  Result Value Ref Range Status   MRSA by PCR Next Gen DETECTED (A) NOT DETECTED Final    Comment: RESULT CALLED TO, READ BACK BY AND VERIFIED WITH: RAMONE MAIZA 11/21/22 1303 MW (NOTE) The GeneXpert MRSA Assay (FDA approved for NASAL specimens only), is one component of a comprehensive MRSA colonization surveillance program.  It is not intended to diagnose MRSA infection nor to guide or monitor treatment for MRSA infections. Test performance is not FDA approved in patients less than 37 years old. Performed at [facility], [address] [place], [place] [address]     Coagulation Studies: No results for input(s): "LABPROT", "INR" in the last 72 hours.   Urinalysis: No results for input(s): "COLORURINE", "LABSPEC", "PHURINE", "GLUCOSEU", "HGBUR", "BILIRUBINUR", "KETONESUR", "PROTEINUR", "UROBILINOGEN", "NITRITE", "LEUKOCYTESUR" in the last 72 hours.  Invalid input(s): "APPERANCEUR"    Imaging: PERIPHERAL VASCULAR CATHETERIZATION  Result Date: 11/24/2022 See surgical note for result.    Medications:    sodium chloride 10 mL/hr at 11/24/22 0911    ceFAZolin (ANCEF) IV      ceFAZolin (ANCEF) IV     [MAR Hold] vancomycin Stopped (11/22/22 1738)    [MAR Hold] amLODipine  10 mg Oral BH-q7a   [MAR Hold] calcitRIOL  0.75 mcg Oral Q M,W,F   [MAR Hold] Chlorhexidine Gluconate Cloth  6 each Topical Daily   Chlorhexidine Gluconate Cloth  6 each Topical Once   [MAR Hold] cinacalcet  30 mg Oral Q M,W,F   [MAR Hold] heparin injection (subcutaneous)  5,000 Units Subcutaneous Q8H   [MAR Hold] insulin aspart  0-5 Units Subcutaneous QHS   [MAR Hold] insulin aspart  0-6 Units Subcutaneous TID WC   [MAR Hold] metoprolol tartrate  25 mg Oral BID   [MAR Hold] pravastatin  20 mg Oral q1800   [MAR Hold] acetaminophen **OR** [MAR Hold] acetaminophen, ceFAZolin (ANCEF) IV, diphenhydrAMINE, famotidine, [MAR Hold] fentaNYL (SUBLIMAZE) injection, fentaNYL, heparin  sodium (porcine), [MAR Hold] HYDROcodone-acetaminophen, [MAR Hold]  HYDROmorphone (DILAUDID) injection, iodixanol, methylPREDNISolone (SOLU-MEDROL) injection, midazolam, midazolam, [MAR Hold]  morphine injection, [MAR Hold] ondansetron **OR** [MAR Hold] ondansetron (ZOFRAN) IV, [MAR Hold] ondansetron (ZOFRAN) IV  Assessment/ Plan:  [name] is a 82 y.o. black male with end stage renal disease on hemodialysis, hypertension, diabetes mellitus type II, diabetic retinopathy, gout, hyperlipidemia, sleep apnea, thrombocytopenia, who is admitted to [facility] on 11/18/2022 for Internal jugular vein thrombosis, right [I82.C11] Internal jugular (IJ) vein thromboembolism, acute, right [I82.C11] Acute deep vein thrombosis (DVT) of axillary vein of right upper extremity [I82.A11]  [facility] Right IJ permcath 77.5kg  End stage renal disease: Seen and examined on hemodialysis treatment. Via right femoral tunneled catheter.   Hypertension with chronic kidney disease: 125/52.  Current regimen of amlodipine and metoprolol. Patient's enalapril is being held.     Anemia of chronic kidney disease: with history of thrombocytopenia. ESA at outpatient.   Secondary Hyperparathyroidism: outpatient labs: elevated PTH 702 and hyperphosphatemia. Calcium upper level of normal on labs. Continue patient's cinacalcet and calcitriol.    LOS: 6 Tawonna Esquer 4/5/20241:26 PM

## 2022-11-24 NOTE — Plan of Care (Signed)

## 2022-11-24 NOTE — Interval H&P Note (Signed)
History and Physical Interval Note:  11/24/2022 10:02 AM  [name] [name]  has presented today for surgery, with the diagnosis of PVD.  The various methods of treatment have been discussed with the patient and family. After consideration of risks, benefits and other options for treatment, the patient has consented to  Procedure(s): UPPER EXTREMITY VENOGRAPHY (Right) as a surgical intervention.  The patient's history has been reviewed, patient examined, no change in status, stable for surgery.  I have reviewed the patient's chart and labs.  Questions were answered to the patient's satisfaction.     [name]

## 2022-11-24 NOTE — Progress Notes (Addendum)
Patient complained of dizziness and blurry vision. Patient's blood  pressure 75/47 (56). Patient received 200 cc ns. UF rate paused due to symptoms. Patient alert and awake. Made MD aware and ordered for UF to remain off until further blood pressure checks.

## 2022-11-25 DIAGNOSIS — R531 Weakness: Secondary | ICD-10-CM

## 2022-11-25 LAB — CBC
HCT: 30.8 % — ABNORMAL LOW (ref 39.0–52.0)
Hemoglobin: 9.7 g/dL — ABNORMAL LOW (ref 13.0–17.0)
MCH: 30 pg (ref 26.0–34.0)
MCHC: 31.5 g/dL (ref 30.0–36.0)
MCV: 95.4 fL (ref 80.0–100.0)
Platelets: 129 10*3/uL — ABNORMAL LOW (ref 150–400)
RBC: 3.23 MIL/uL — ABNORMAL LOW (ref 4.22–5.81)
RDW: 15.3 % (ref 11.5–15.5)
WBC: 9.1 10*3/uL (ref 4.0–10.5)
nRBC: 0 % (ref 0.0–0.2)

## 2022-11-25 LAB — BASIC METABOLIC PANEL
Anion gap: 15 (ref 5–15)
BUN: 28 mg/dL — ABNORMAL HIGH (ref 8–23)
CO2: 27 mmol/L (ref 22–32)
Calcium: 10 mg/dL (ref 8.9–10.3)
Chloride: 96 mmol/L — ABNORMAL LOW (ref 98–111)
Creatinine, Ser: 5.63 mg/dL — ABNORMAL HIGH (ref 0.61–1.24)
GFR, Estimated: 10 mL/min — ABNORMAL LOW (ref >60)
Glucose, Bld: 110 mg/dL — ABNORMAL HIGH (ref 70–99)
Potassium: 4 mmol/L (ref 3.5–5.1)
Sodium: 138 mmol/L (ref 135–145)

## 2022-11-25 LAB — GLUCOSE, CAPILLARY
Glucose-Capillary: 102 mg/dL — ABNORMAL HIGH (ref 70–99)
Glucose-Capillary: 107 mg/dL — ABNORMAL HIGH (ref 70–99)

## 2022-11-25 MED ORDER — VANCOMYCIN HCL 750 MG/150ML IV SOLN: 750.0000 mg | INTRAVENOUS | Status: DC

## 2022-11-25 NOTE — TOC Initial Note (Signed)
Transition of Care [facility]) - Initial/Assessment Note    Patient Details  Name: [name] [name] MRN: [id number] Date of Birth: 1941/07/24  [facility]) CM/SW Contact:    [name] Quarry, RN Phone Number: 11/25/2022, 10:17 AM  Clinical Narrative: 11/25/22: Per provider patient potential discharge today with HH orders in. Spoke with patient and had no preference for [facility] agency and has not used one before. Was agreeable to [facility] which accepted insurance. [facility] to reach out to patient. Patient lives with daughter currently and she helps with transportation to appointments, will pick up on discharge today,  has no trouble obtaining medications, has 3 stairs into house and has a RW at home.   PCP verified [name] [name] at [facility].  Rx: [facility] - [place] (N), [place] - [address] [[id number]]     [name] [name] (Daughter) (DPR verified).  [phone] [facility])     [name] Cirri RN CM   [phone] (weekends)         Expected Discharge Plan: Home w Home Health Services Barriers to Discharge: Barriers Resolved   Patient Goals and CMS Choice   CMS Medicare.gov Compare Post Acute Care list provided to:: Patient Choice offered to / list presented to : Patient      Expected Discharge Plan and Services   Discharge Planning Services: CM Consult Post Acute Care Choice: Home Health Living arrangements for the past 2 months: Holiday representative (Lives with Daughter at this time.)                 DME Arranged: N/A DME Agency: NA       HH Arranged: PT, OT HH AgencyHotel manager Home Health Care Date [facility] Agency Contacted: 11/25/22 Time HH Agency Contacted: 1014 Representative spoke with at [facility] Agency: [name] [name]  Prior Living Arrangements/Services Living arrangements for the past 2 months: Holiday representative (Lives with Daughter at this time.) Lives with:: Adult Children Patient language and need for interpreter reviewed:: No Do you feel safe going back  to the place where you live?: Yes      Need for Family Participation in Patient Care: Yes (Comment) Care giver support system in place?: Yes (comment)   Criminal Activity/Legal Involvement Pertinent to Current Situation/Hospitalization: No - Comment as needed  Activities of Daily Living Home Assistive Devices/Equipment: Cane (specify quad or straight), Eyeglasses (straight) ADL Screening (condition at time of admission) Patient's cognitive ability adequate to safely complete daily activities?: Yes Is the patient deaf or have difficulty hearing?: No Does the patient have difficulty seeing, even when wearing glasses/contacts?: No Does the patient have difficulty concentrating, remembering, or making decisions?: No Patient able to express need for assistance with ADLs?: Yes Does the patient have difficulty dressing or bathing?: No Independently performs ADLs?: No Communication: Independent Dressing (OT): Needs assistance (due to infection of right arm) Is this a change from baseline?: Pre-admission baseline (due to infection of right arm; daughter has been helping patient) Grooming: Needs assistance Is this a change from baseline?: Pre-admission baseline (due to infection of right arm; daughter has been helping patient) Feeding: Independent Bathing: Needs assistance Is this a change from baseline?: Pre-admission baseline (due to infection of right arm; daughter has been helping patient) Toileting: Independent In/Out Bed: Independent Walks in Home: Independent Does the patient have difficulty walking or climbing stairs?: No Weakness of Legs: Both Weakness of Arms/Hands: None  Permission Sought/Granted Permission sought to share information with : Case Manager, Other (comment) (Home Health Agency) Permission granted to  share information with : Yes, Verbal Permission Granted  Share Information with NAME: [name] [name]  Permission granted to share info w AGENCY: [name] [name]  Permission granted to  share info w Relationship: Daughter  Permission granted to share info w Contact Information: [name] [name], [name] [name] (Daughter) [phone] (Mobile)  Emotional Assessment Appearance:: Appears stated age Attitude/Demeanor/Rapport: Engaged, Gracious Affect (typically observed): Accepting Orientation: : Oriented to Self, Oriented to Place, Oriented to Situation Alcohol / Substance Use: Not Applicable Psych Involvement: No (comment)  Admission diagnosis:  Internal jugular vein thrombosis, right [I82.C11] Internal jugular (IJ) vein thromboembolism, acute, right [I82.C11] Acute deep vein thrombosis (DVT) of axillary vein of right upper extremity [I82.A11] Patient Active Problem List   Diagnosis Date Noted   Internal jugular (IJ) vein thromboembolism, acute, right 11/18/2022   Postoperative complication of skin involving drainage from surgical wound from right arm AVF placement 11/18/2022   Cellulitis of right arm 11/18/2022   S/P arteriovenous (AV) fistula creation 10/26/22 11/18/2022   ESRD (end stage renal disease) on dialysis 04/14/2022   Gout 04/14/2022   Acute lower UTI 04/14/2022   Peritoneal dialysis catheter dysfunction 04/14/2022   Acute on chronic anemia 04/05/2022   PD catheter dysfunction    Hypokalemia 12/19/2021   ESRD on dialysis    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    Chronic kidney disease, stage V 09/20/2021   Seasonal allergic rhinitis due to pollen 12/15/2020   Ventral hernia without obstruction or gangrene 12/15/2020   Proteinuria 06/14/2019   Secondary hyperparathyroidism of renal origin 06/14/2019   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   Class 1 obesity due to excess calories with serious comorbidity and body mass index (BMI) of 34.0 to 34.9 in adult 03/28/2019   Benign essential hypertension 03/28/2019   History of gout 03/28/2019   Mixed hyperlipidemia 03/28/2019   Diabetes mellitus, type II 03/28/2019   Essential thrombocytopenia 06/28/2016    Thrombocytopenia 05/07/2016   Chronic kidney disease, stage IV (severe) 05/07/2016   PCP:  Marisue Ivan, MD Pharmacy:   [facility] [address] ([address]), [place] - [address] [place]nn M[place]) [place] [address] Phone: [phone] Fax: [phone]     Social Determinants of Health (SDOH) Social History: SDOH Screenings   Food Insecurity: No Food Insecurity (11/19/2022)  Housing: Low Risk  (11/19/2022)  Transportation Needs: No Transportation Needs (11/19/2022)  Utilities: Not At Risk (11/19/2022)  Tobacco Use: Low Risk  (11/21/2022)   SDOH Interventions:     Readmission Risk Interventions     No data to display

## 2022-11-25 NOTE — Evaluation (Signed)
Occupational Therapy Evaluation Patient Details Name: [name] [name] MRN: [id number] DOB: [birth year] Today's Date: 11/25/2022   History of Present Illness Pt is an 82 y/o M admitted on 11/18/22 after presenting with c/o 2 week hx of pain, swelling, & redness in the R arm extending from fingers to mid upper arm. RUE venous Dopplers showing nonocclusive thrombus in the right IJ & axillary veins & pt found to have RUE cellulitis. Pt was started on IV heparin & later underwent RUE venogram & superior venacavogram. Pt had chest dialysis catheter removed & R femoral tunneled HD catheter placed on 11/24/22. PMH: DM, HTN, gout, anemia of CKD, essential thrombocytopenia, ESRD on HD MWF via tunneled catheter, s/p placement of R AV fistula on 10/26/22, CA, HLD, renal mass, sleep apnea   Clinical Impression   Patient received for OT evaluation. See flowsheet below for details of function. Generally,  pt MOD (I) with RW for functional mobility, and MOD (I) for ADLs. Pt did have perseveration on world/national issues (unclear if this is baseline or not) and OT recommends family intermittent supervision.   No further acute OT needs at this time.     Recommendations for follow up therapy are one component of a multi-disciplinary discharge planning process, led by the attending physician.  Recommendations may be updated based on patient status, additional functional criteria and insurance authorization.   Assistance Recommended at Discharge Set up Supervision/Assistance  Patient can return home with the following Other (comment) (supervision for cognition/behavior in case perseveration is not baseline)    Functional Status Assessment  Patient has not had a recent decline in their functional status  Equipment Recommendations  None recommended by OT    Recommendations for Other Services       Precautions / Restrictions Precautions Precautions: Fall Restrictions Weight Bearing Restrictions: No       Mobility Bed Mobility               General bed mobility comments: pt in recliner today when OT arrived and left in recliner.    Transfers Overall transfer level: Modified independent Equipment used: Rolling walker (2 wheels) Transfers: Sit to/from Stand Sit to Stand: Modified independent (Device/Increase time)           General transfer comment: needing extra time for STS with RW.      Balance Overall balance assessment: Needs assistance         Standing balance support: Reliant on assistive device for balance Standing balance-Leahy Scale: Fair                             ADL either performed or assessed with clinical judgement   ADL Overall ADL's : Modified independent                                       General ADL Comments: Pt able to get dressed today without assistance, able to walk with RW to the sink and brush teeth; shows good flexibility. Denies need for toileting at this time; shows good safety awareness with RW. Did have extra effort needed to rise from low recliner.     Vision         Perception     Praxis      Pertinent Vitals/Pain Pain Assessment Pain Assessment: No/denies pain     Hand Dominance     Extremity/Trunk  Assessment Upper Extremity Assessment Upper Extremity Assessment: Overall WFL for tasks assessed RUE Deficits / Details: RUE edema   Lower Extremity Assessment Lower Extremity Assessment: Defer to PT evaluation   Cervical / Trunk Assessment Cervical / Trunk Assessment: Normal   Communication Communication Communication: No difficulties   Cognition Arousal/Alertness: Awake/alert Behavior During Therapy: WFL for tasks assessed/performed Overall Cognitive Status: Within Functional Limits for tasks assessed                                 General Comments: Pt is alert and oriented and follows OT cues during session. Able to state the date correctly. However, pt extremely  perseverative throughout session about multiple different national and global problems, all of which are out of pt's control (example: too many billionaires, government providing aide abroad and not at home, high cost of healthcare, lack of social safety nets in the [place], etc). Pt is able to be redirected to the task of getting dressed and walking to the sink, and is kind and agreeable with OT, but continues to be perseverative. Of note, all of the issues pt discussing were factual and correct, but his behavior was quite odd given the setting and the lack of actionable solutions besides voting.     General Comments  Pt on room air. OT notified medical team about concerns for perseveration on world/national affairs.    Exercises     Shoulder Instructions      Home Living Family/patient expects to be discharged to:: Private residence Living Arrangements: Spouse/significant other;Children (wife has dementia) Available Help at Discharge: Family;Available PRN/intermittently Type of Home: House Home Access: Stairs to enter Entergy CorporationEntrance Stairs-Number of Steps: 3 Entrance Stairs-Rails: Left Home Layout: One level     Bathroom Shower/Tub: Producer, television/film/videoWalk-in shower   Bathroom Toilet: Standard Bathroom Accessibility: Yes How Accessible: Accessible via walker Home Equipment: Cane - single point;BSC/3in1;Shower seat;Rolling Walker (2 wheels)   Additional Comments: Pt lives with wife who has dementia & pt cares for her. Daughter works during the day but can come by around 7pm.      Prior Functioning/Environment Prior Level of Function : Independent/Modified Independent;Driving             Mobility Comments: Pt ambulatory with SPC, still driving, caregiver for his wife who has dementia. ADLs Comments: Pt reports he cooks & cleans; (I) ADLs.        OT Problem List:        OT Treatment/Interventions:      OT Goals(Current goals can be found in the care plan section) Acute Rehab OT Goals Patient  Stated Goal: Go home to take care of wife OT Goal Formulation: All assessment and education complete, DC therapy  OT Frequency:      Co-evaluation              AM-PAC OT "6 Clicks" Daily Activity     Outcome Measure Help from another person eating meals?: None Help from another person taking care of personal grooming?: None Help from another person toileting, which includes using toliet, bedpan, or urinal?: None Help from another person bathing (including washing, rinsing, drying)?: None Help from another person to put on and taking off regular upper body clothing?: None Help from another person to put on and taking off regular lower body clothing?: None 6 Click Score: 24   End of Session Equipment Utilized During Treatment: Rolling walker (2 wheels) Nurse Communication: Mobility status;Other (  comment) (pt behavior during session)  Activity Tolerance: Patient tolerated treatment well Patient left: in chair;with chair alarm set;with call bell/phone within reach                   Time: 1001-1032 OT Time Calculation (min): 31 min Charges:  OT General Charges $OT Visit: 1 Visit OT Evaluation $OT Eval Moderate Complexity: 1 Mod OT Treatments $Self Care/Home Management : 8-22 mins Linward Foster, MS, OTR/L  Alvester Morin 11/25/2022, 11:35 AM

## 2022-11-25 NOTE — Discharge Summary (Signed)
Physician Discharge Summary  [name] BrasHurley Huffine ZOX:[id number] DOB: 09-03-1940 DOA: 11/18/2022  PCP: Marisue IvanLinthavong, Kanhka, MD  Admit date: 11/18/2022 Discharge date: 11/25/2022  Admitted From: home  Disposition:  home w/ home health   Recommendations for Outpatient Follow-up:  Follow up with PCP in 1-2 weeks F/u w/ nephro, Dr. Wynelle LinkKolluru, in 1-2 weeks   Home Health: yes  Equipment/Devices:  Discharge Condition: stable  CODE STATUS: full  Diet recommendation: Heart Healthy / Carb Modified   Brief/Interim Summary: HPI was taken from Dr. Para Marchuncan: [name] [name] is a 82 y.o. male with medical history significant for DM, HTN, gout, , anemia of CKD, essential thrombocytopenia, ESRD on HD MWF via tunneled catheter, who is s/p placement of right AV fistula on 10/26/2022 who presents to the ED with a 2-week history of pain, swelling and redness in the arm extending from the fingers to the mid upper arm.   He denies fever or chills.He states a few days after the procedure on 3/7, he noted swelling and bloody oozing from the incision and was applying peroxide however as the days progressed he noticed increasing swelling and then over the past 3 days it became warm red and tender.  He states his last dialysis session was on Wednesday, March 27 as he could not drive to his session on Friday because his arm was so swollen and tender and weeping.  He denies shortness of breath or chest pain ED course and data review: Vitals within normal limits.  CBC notable for hemoglobin of 9.8, down from 12.6 about 3 weeks prior although baseline is around 7.5.  Platelets at baseline at 95,000.  CMP at baseline.  INR 1.4. Right upper extremity venous Dopplers showing nonocclusive thrombus in the right IJ and axillary veins. Right upper extremity arterial duplex showing patent right radiocephalic AV fistula Chest x-ray showed normal placement of catheter. The ED provider spoke with vascular surgeon Dr. Norma FredricksonFarr who recommended IV  heparin with plans for thrombectomy on Monday/1/24. Patient was started on heparin.  Cultures were taken of the wound discharge, blood cultures done and patient was also treated with ceftriaxone and vancomycin for possible wound infection.  Hospitalist consulted for admission.   As per Dr. Myriam ForehandAyiku: [name] BrasHurley Stockhausen is a 82 y.o. 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  He presented to the hospital for swelling and pain in the right upper extremity extending from the fingers to the mid upper arm   As per Dr. Mayford KnifeWilliams 4/3-11/25/22: Pt had tunneled femoral cath placed by vascular surg. Pt completed HD through the tunneled femoral HD cath successfully. PT/OT evaluated the pt and recommended home health. Home health was set up by CM prior to d/c. For more information, please see previous progress/consult notes.   Discharge Diagnoses:  Principal Problem:   Internal jugular (IJ) vein thromboembolism, acute, right Active Problems:   Postoperative complication of skin involving drainage from surgical wound from right arm AVF placement   Cellulitis of right arm   S/P arteriovenous (AV) fistula creation 10/26/22   Acute on chronic anemia   ESRD (end stage renal disease) on dialysis   Anemia in chronic kidney disease   Essential thrombocytopenia   Benign essential hypertension   Diabetes mellitus, type II   Gout  R/o acute right internal jugular vein and axillary vein thrombosis: as per vasc  surg on 11/20/22. IV heparin drip was d/c by vasc surg. S/p tunneled femoral cath placement today 11/24/22. Vasc surg following and recs apprec    Right antecubital fossa wound infection: from right AV fistula site. Wound culture grew MRSA. Continue on IV vanco MWF w/ HD x 14 days. Norco, morphine prn for pain     DM2: w/ hypoglycemic episodes. S/p hypoglycemic event on 11/20/2022 which occurred when he was NPO while awaiting vascular procedure. Hypoglycemic episodes have resolved    ESRD: on HD MWF. Management as per neprho    ACD: likely secondary to ESRD. H&H are stable   Generalized weakness: PT/OT recs home health   Discharge Instructions  Discharge Instructions     Diet - low sodium heart healthy   Complete by: As directed    Diet Carb Modified   Complete by: As directed    Discharge instructions   Complete by: As directed    F/u w/ PCP in 1-2 weeks. F/u w/ nephro, Dr. Wynelle Link, in 1-2 weeks   Increase activity slowly   Complete by: As directed       Allergies as of 11/25/2022       Reactions   Fish Allergy Swelling   Morphine Other (See Comments)   About 5-10 minutes after admin of 2 mg morphine IV, patient became very agitated and tearful, loudly expressing desire to "be taken by Jesus", unable to reason with or calm patient   Ciprofloxacin    Unknown reaction   Doxazosin Other (See Comments)   unknown        Medication List     TAKE these medications    acetaminophen 500 MG tablet Commonly known as: TYLENOL Take 2 tablets (1,000 mg total) by mouth every 6 (six) hours as needed for mild pain. What changed: when to take this   amLODipine 10 MG tablet Commonly known as: NORVASC Take 10 mg by mouth every morning.   aspirin EC 81 MG tablet Take 81 mg by mouth daily. Swallow whole.   azelastine 0.1 % nasal spray Commonly known as: ASTELIN Place 1 spray into both nostrils 2 (two) times daily as needed for rhinitis. Use in each nostril as directed   bicalutamide 50 MG tablet Commonly known as: CASODEX Take 50 mg by mouth every morning.   cinacalcet 30 MG tablet Commonly known as: SENSIPAR Take 30 mg by mouth daily.   enalapril 20 MG tablet Commonly known as: VASOTEC Take 20 mg by mouth 2 (two) times daily.   fluticasone 50 MCG/ACT nasal spray Commonly known  as: FLONASE Place 1 spray into both nostrils daily as needed for allergies or rhinitis.   HYDROcodone-acetaminophen 5-325 MG tablet Commonly known as: NORCO/VICODIN Take 2 tablets by mouth every 6 (six) hours as needed for moderate pain.   lovastatin 20 MG tablet Commonly known as: MEVACOR Take 20 mg by mouth at bedtime.   metoprolol tartrate 50 MG tablet Commonly known as: LOPRESSOR Take 25 mg by mouth 2 (two) times daily.   polyethylene glycol 17 g packet Commonly known as: MIRALAX / GLYCOLAX Take 17 g by mouth daily as needed.   sodium bicarbonate 650 MG tablet Take 1,300 mg by mouth 2 (two) times daily.   Uloric 40 MG tablet Generic drug: febuxostat Take 40 mg by mouth every morning.   vancomycin 750 MG/150ML Soln Commonly known as: VANCOREADY Inject 150 mLs (750 mg total) into the vein every Monday, Wednesday, and Friday with hemodialysis. Start taking on: November 27, 2022        Allergies  Allergen Reactions   Fish Allergy Swelling   Morphine Other (See Comments)    About 5-10 minutes after admin of 2 mg morphine IV, patient became very agitated and tearful, loudly expressing desire to "be taken by Jesus", unable to reason with or calm patient   Ciprofloxacin     Unknown reaction   Doxazosin Other (See Comments)    unknown    Consultations: Vasc surg Nephro    Procedures/Studies: PERIPHERAL VASCULAR CATHETERIZATION  Result Date: 11/24/2022 See surgical note for result.  PERIPHERAL VASCULAR CATHETERIZATION  Result Date: 11/20/2022 See surgical note for result.  [place] Upper Ext Art Right Ltd  Result Date: 11/18/2022 CLINICAL DATA:  Recent right AV fistula, new swelling EXAM: RIGHT UPPER EXTREMITY ARTERIAL DUPLEX SCAN TECHNIQUE: Gray-scale sonography as well as color Doppler and duplex ultrasound was performed to evaluate the arteries of the upper extremity. COMPARISON:  None Available. FINDINGS: Sonographic evaluation of the right upper extremity AV fistula  was performed. A radiocephalic right upper extremity AV fistula is identified. Inflow artery: 3.2 mm, 60.69 cm/S Arterial anastomosis: 3.8 mm, 281.32 cm/S Fistula: Proximal: 6.4 mm, 239.91 cm/S Mid: 7.0 mm, 199.93 cm/S Distal: 11.3 mm, 471.25 cm/S Normal color flow and Doppler waveforms are seen within the visualized right radial artery and right cephalic vein. IMPRESSION: 1. Patent right radiocephalic AV fistula as above. Electronically Signed   By: Sharlet Salina M.D.   On: 11/18/2022 20:05   US Venous Img Upper Uni Right(DVT)  Result Date: 11/18/2022 CLINICAL DATA:  Arm swelling.  AV fistula right arm. EXAM: Right UPPER EXTREMITY VENOUS DOPPLER ULTRASOUND TECHNIQUE: Gray-scale sonography with graded compression, as well as color Doppler and duplex ultrasound were performed to evaluate the upper extremity deep venous system from the level of the subclavian vein and including the jugular, axillary, basilic, radial, ulnar and upper cephalic vein. Spectral Doppler was utilized to evaluate flow at rest and with distal augmentation maneuvers. COMPARISON:  None Available. FINDINGS: Contralateral Subclavian Vein: Respiratory phasicity is normal and symmetric with the symptomatic side. No evidence of thrombus. Normal compressibility. Internal Jugular Vein: Nonocclusive thrombus noted in the right internal jugular vein. Subclavian Vein: No evidence of thrombus. Normal compressibility, respiratory phasicity and response to augmentation. Axillary Vein: Nonocclusive thrombus noted in the axillary vein. Cephalic Vein: No evidence of thrombus. Normal compressibility, respiratory phasicity and response to augmentation. Basilic Vein: No evidence of thrombus. Normal compressibility, respiratory phasicity and response to augmentation. Brachial Veins: No evidence of thrombus. Normal compressibility, respiratory phasicity and response to augmentation. Radial Veins: No evidence of thrombus. Normal compressibility, respiratory  phasicity and response to augmentation. Ulnar Veins: No evidence of thrombus. Normal compressibility, respiratory phasicity and response to augmentation. Venous Reflux:  None visualized. Other Findings: Brisk and pulsatile flow in the larger veins related to the presence of the fistula. IMPRESSION: Nonocclusive thrombus noted in the right internal jugular and axillary veins. Electronically Signed   By: Paulina Fusi M.D.   On: 11/18/2022 19:51   DG Chest Portable 1 View  Result Date: 11/18/2022 CLINICAL DATA:  Catheter placement. EXAM: PORTABLE CHEST 1 VIEW COMPARISON:  Chest radiograph dated 03/03/2022. FINDINGS: Right IJ dual-lumen dialysis catheter with tips over central SVC close to the cavoatrial junction. No pneumothorax. No focal consolidation or pleural effusion. The cardiac silhouette is within normal limits. No acute osseous pathology. IMPRESSION: Right IJ dual-lumen dialysis catheter with tips over the central SVC. No pneumothorax. Electronically Signed   By:  Elgie Collard M.D.   On: 11/18/2022 19:17   (Echo, Carotid, EGD, Colonoscopy, ERCP)    Subjective: Pt denies any complaints    Discharge Exam: Vitals:   11/25/22 0636 11/25/22 0837  BP: (!) 101/49 (!) 104/52  Pulse: 64 84  Resp:  18  Temp:  97.8 F (36.6 C)  SpO2:  100%   Vitals:   11/24/22 2330 11/25/22 0412 11/25/22 0636 11/25/22 0837  BP: (!) 111/44 (!) 118/59 (!) 101/49 (!) 104/52  Pulse: 82 65 64 84  Resp: Temp: 98.5 F (36.9 C) 97.8 F (36.6 C)  97.8 F (36.6 C)  TempSrc: Oral Oral  Oral  SpO2: 100% 99%  100%  Weight:  75.1 kg    Height:        General: Pt is alert, awake, not in acute distress Cardiovascular: S1/S2 +, no rubs, no gallops Respiratory: CTA bilaterally, no wheezing, no rhonchi Abdominal: Soft, NT, ND, bowel sounds + Extremities: no cyanosis    The results of significant diagnostics from this hospitalization (including imaging, microbiology, ancillary and laboratory) are  listed below for reference.     Microbiology: Recent Results (from the past 240 hour(s))  Blood culture (routine x 2)     Status: None   Collection Time: 11/18/22  5:00 PM   Specimen: BLOOD  Result Value Ref Range Status   Specimen Description BLOOD BLOOD LEFT ARM  Final   Special Requests   Final    BOTTLES DRAWN AEROBIC AND ANAEROBIC Blood Culture results may not be optimal due to an excessive volume of blood received in culture bottles   Culture   Final    NO GROWTH 5 DAYS Performed at [facility], [address] [place], [place] [address]    Report Status 11/23/2022 FINAL  Final  Aerobic Culture w Gram Stain (superficial specimen)     Status: None   Collection Time: 11/18/22  5:00 PM   Specimen: Wound  Result Value Ref Range Status   Specimen Description   Final    WOUND Performed at [facility], [address], [place], [place] [address]    Special Requests   Final    HDF Performed at [facility], [address], [place], [place] [address]    Gram Stain   Final    FEW GRAM POSITIVE COCCI IN PAIRS IN CLUSTERS RARE WBC PRESENT, PREDOMINANTLY PMN RARE SQUAMOUS EPITHELIAL CELLS PRESENT Performed at [facility], [address], [place], [place] [address]    Culture   Final    ABUNDANT METHICILLIN RESISTANT STAPHYLOCOCCUS AUREUS   Report Status 11/20/2022 FINAL  Final   Organism ID, Bacteria METHICILLIN RESISTANT STAPHYLOCOCCUS AUREUS  Final      Susceptibility   Methicillin resistant staphylococcus aureus - MIC*    CIPROFLOXACIN >=8 RESISTANT Resistant     ERYTHROMYCIN >=8 RESISTANT Resistant     GENTAMICIN <=0.5 SENSITIVE Sensitive     OXACILLIN >=4 RESISTANT Resistant     TETRACYCLINE <=1 SENSITIVE Sensitive     VANCOMYCIN <=0.5 SENSITIVE Sensitive     TRIMETH/SULFA <=10 SENSITIVE Sensitive     CLINDAMYCIN <=0.25 SENSITIVE Sensitive     RIFAMPIN <=0.5 SENSITIVE Sensitive     Inducible Clindamycin NEGATIVE Sensitive     *  ABUNDANT METHICILLIN RESISTANT STAPHYLOCOCCUS AUREUS  Blood culture (routine x 2)     Status: None   Collection Time: 11/19/22  5:52 AM   Specimen: BLOOD  Result Value  Ref Range Status   Specimen Description BLOOD BLOOD LEFT ARM  Final   Special Requests   Final    BOTTLES DRAWN AEROBIC AND ANAEROBIC Blood Culture adequate volume   Culture   Final    NO GROWTH 5 DAYS Performed at [facility], [address], [place], [place] [address]    Report Status 11/24/2022 FINAL  Final  MRSA Next Gen by PCR, Nasal     Status: Abnormal   Collection Time: 11/21/22 10:45 AM   Specimen: Nasal Mucosa; Nasal Swab  Result Value Ref Range Status   MRSA by PCR Next Gen DETECTED (A) NOT DETECTED Final    Comment: RESULT CALLED TO, READ BACK BY AND VERIFIED WITH: RAMONE MAIZA 11/21/22 1303 MW (NOTE) The GeneXpert MRSA Assay (FDA approved for NASAL specimens only), is one component of a comprehensive MRSA colonization surveillance program. It is not intended to diagnose MRSA infection nor to guide or monitor treatment for MRSA infections. Test performance is not FDA approved in patients less than 85 years old. Performed at [facility], [address] [place], [place] [address]      Labs: BNP (last 3 results) No results for input(s): "BNP" in the last 8760 hours. Basic Metabolic Panel: Recent Labs  Lab 11/20/22 0813 11/21/22 0425 11/22/22 1412 11/23/22 1029 11/24/22 0452 11/25/22 0513  NA 139 137 138 136 137 138  K 5.1 4.0 3.8 3.9 3.7 4.0  CL 102 99 98 95* 98 96*  CO2 23 24 25 27 25 27   GLUCOSE 108* 101* 108* 114* 99 110*  BUN 82* 40* 58* 35* 45* 28*  CREATININE 10.42* 6.36* 8.97* 6.33* 7.93* 5.63*  CALCIUM 9.4 9.8 9.8 10.2 10.1 10.0  PHOS 6.4*  --  6.5*  --   --   --    Liver Function Tests: Recent Labs  Lab 11/18/22 1700 11/20/22 0813 11/22/22 1412  AST 11*  --   --   ALT 7  --   --   ALKPHOS 74  --   --   BILITOT 0.7  --   --   PROT 7.2  --   --    ALBUMIN 3.4* 2.8* 2.8*   No results for input(s): "LIPASE", "AMYLASE" in the last 168 hours. No results for input(s): "AMMONIA" in the last 168 hours. CBC: Recent Labs  Lab 11/18/22 1700 11/19/22 0108 11/21/22 0425 11/22/22 1412 11/23/22 1029 11/24/22 0452 11/25/22 0513  WBC 10.4   < > 6.6 7.7 8.7 7.9 9.1  NEUTROABS 6.7  --   --   --   --   --   --   HGB 9.8*   < > 10.5* 9.3* 9.8* 9.7* 9.7*  HCT 31.5*   < > 32.0* 28.7* 30.7* 30.4* 30.8*  MCV 96.6   < > 93.3 93.8 94.5 95.0 95.4  PLT 95*   < > 114* 124* 142* 130* 129*   < > = values in this interval not displayed.   Cardiac Enzymes: No results for input(s): "CKTOTAL", "CKMB", "CKMBINDEX", "TROPONINI" in the last 168 hours. BNP: Invalid input(s): "POCBNP" CBG: Recent Labs  Lab 11/24/22 0827 11/24/22 1759 11/24/22 2125 11/25/22 0831 11/25/22 1138  GLUCAP 103* 64* 148* 102* 107*   D-Dimer No results for input(s): "DDIMER" in the last 72 hours. Hgb A1c No results for input(s): "HGBA1C" in the last 72 hours. Lipid Profile No results for input(s): "CHOL", "HDL", "LDLCALC", "TRIG", "CHOLHDL", "LDLDIRECT" in the last 72 hours. Thyroid function studies No  results for input(s): "TSH", "T4TOTAL", "T3FREE", "THYROIDAB" in the last 72 hours.  Invalid input(s): "FREET3" Anemia work up No results for input(s): "VITAMINB12", "FOLATE", "FERRITIN", "TIBC", "IRON", "RETICCTPCT" in the last 72 hours. Urinalysis    Component Value Date/Time   COLORURINE STRAW (A) 04/14/2022 0011   APPEARANCEUR HAZY (A) 04/14/2022 0011   APPEARANCEUR Cloudy 10/11/2012 1930   LABSPEC 1.006 04/14/2022 0011   LABSPEC 1.018 10/11/2012 1930   PHURINE 9.0 (H) 04/14/2022 0011   GLUCOSEU NEGATIVE 04/14/2022 0011   GLUCOSEU Negative 10/11/2012 1930   HGBUR LARGE (A) 04/14/2022 0011   BILIRUBINUR NEGATIVE 04/14/2022 0011   BILIRUBINUR Negative 10/11/2012 1930   KETONESUR NEGATIVE 04/14/2022 0011   PROTEINUR 100 (A) 04/14/2022 0011   NITRITE NEGATIVE  04/14/2022 0011   LEUKOCYTESUR NEGATIVE 04/14/2022 0011   LEUKOCYTESUR Negative 10/11/2012 1930   Sepsis Labs Recent Labs  Lab 11/22/22 1412 11/23/22 1029 11/24/22 0452 11/25/22 0513  WBC 7.7 8.7 7.9 9.1   Microbiology Recent Results (from the past 240 hour(s))  Blood culture (routine x 2)     Status: None   Collection Time: 11/18/22  5:00 PM   Specimen: BLOOD  Result Value Ref Range Status   Specimen Description BLOOD BLOOD LEFT ARM  Final   Special Requests   Final    BOTTLES DRAWN AEROBIC AND ANAEROBIC Blood Culture results may not be optimal due to an excessive volume of blood received in culture bottles   Culture   Final    NO GROWTH 5 DAYS Performed at [facility], [address], [place], [place] [address]    Report Status 11/23/2022 FINAL  Final  Aerobic Culture w Gram Stain (superficial specimen)     Status: None   Collection Time: 11/18/22  5:00 PM   Specimen: Wound  Result Value Ref Range Status   Specimen Description   Final    WOUND Performed at [facility], [address], [place], [place] [address]    Special Requests   Final    HDF Performed at [facility], [address] [place], [place] [address]    Gram Stain   Final    FEW GRAM POSITIVE COCCI IN PAIRS IN CLUSTERS RARE WBC PRESENT, PREDOMINANTLY PMN RARE SQUAMOUS EPITHELIAL CELLS PRESENT Performed at [facility], [address], [place], [place] [address]    Culture   Final    ABUNDANT METHICILLIN RESISTANT STAPHYLOCOCCUS AUREUS   Report Status 11/20/2022 FINAL  Final   Organism ID, Bacteria METHICILLIN RESISTANT STAPHYLOCOCCUS AUREUS  Final      Susceptibility   Methicillin resistant staphylococcus aureus - MIC*    CIPROFLOXACIN >=8 RESISTANT Resistant     ERYTHROMYCIN >=8 RESISTANT Resistant     GENTAMICIN <=0.5 SENSITIVE Sensitive     OXACILLIN >=4 RESISTANT Resistant     TETRACYCLINE <=1 SENSITIVE Sensitive     VANCOMYCIN <=0.5 SENSITIVE Sensitive      TRIMETH/SULFA <=10 SENSITIVE Sensitive     CLINDAMYCIN <=0.25 SENSITIVE Sensitive     RIFAMPIN <=0.5 SENSITIVE Sensitive     Inducible Clindamycin NEGATIVE Sensitive     * ABUNDANT METHICILLIN RESISTANT STAPHYLOCOCCUS AUREUS  Blood culture (routine x 2)     Status: None   Collection Time: 11/19/22  5:52 AM   Specimen: BLOOD  Result Value Ref Range Status   Specimen Description BLOOD BLOOD LEFT ARM  Final   Special Requests   Final    BOTTLES DRAWN AEROBIC AND ANAEROBIC Blood Culture adequate volume  Culture   Final    NO GROWTH 5 DAYS Performed at [facility], [address] [place] [place], [place] [address]    Report Status 11/24/2022 FINAL  Final  MRSA Next Gen by PCR, Nasal     Status: Abnormal   Collection Time: 11/21/22 10:45 AM   Specimen: Nasal Mucosa; Nasal Swab  Result Value Ref Range Status   MRSA by PCR Next Gen DETECTED (A) NOT DETECTED Final    Comment: RESULT CALLED TO, READ BACK BY AND VERIFIED WITH: RAMONE MAIZA 11/21/22 1303 MW (NOTE) The GeneXpert MRSA Assay (FDA approved for NASAL specimens only), is one component of a comprehensive MRSA colonization surveillance program. It is not intended to diagnose MRSA infection nor to guide or monitor treatment for MRSA infections. Test performance is not FDA approved in patients less than 61 years old. Performed at [facility], [address], [place], [place] [address]      Time coordinating discharge: Over 30 minutes  SIGNED:   Charise Killian, MD  [facility] 11/25/2022, 11:45 AM Pager   If 7PM-7AM, please contact night-coverage [email or web address]

## 2022-11-25 NOTE — Evaluation (Signed)
Physical Therapy Evaluation Patient Details Name: [name] [name] MRN: [id number] DOB: March 29, 1941 Today's Date: 11/25/2022  History of Present Illness  Pt is an 82 y/o M admitted on 11/18/22 after presenting with c/o 2 week hx of pain, swelling, & redness in the R arm extending from fingers to mid upper arm. RUE venous Dopplers showing nonocclusive thrombus in the right IJ & axillary veins & pt found to have RUE cellulitis. Pt was started on IV heparin & later underwent RUE venogram & superior venacavogram. Pt had chest dialysis catheter removed & R femoral tunneled HD catheter placed on 11/24/22. 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 Pt reports prior to admission he was living with his wife who has dementia & who he cares for. Pt lives in a 1 level home with 3 steps with L rail to enter & is ambulatory with SPC, still driving. On this date, pt is able to transfer STS with [facility] but constantly reaching for BUE support during gait so provided pt with RW for increased balance & support. Pt is able to ambulate with RW & supervision but does note dizziness/lightheadedness that somewhat improves. Pt noted to have soft BP & MD notified. Pt would benefit from ongoing PT services to address balance, strengthening & gait with LRAD to facilitate return to PLOF.  BP checked in LUE: Supine: 98/41 mmHg (MAP 59) Sitting: 104/52 mmHg (MAP 64) Sitting in recliner after gait: 107/40 mmHg (MAP 61)   Recommendations for follow up therapy are one component of a multi-disciplinary discharge planning process, led by the attending physician.  Recommendations may be updated based on patient status, additional functional criteria and insurance authorization.  Follow Up Recommendations       Assistance Recommended at  Discharge Intermittent Supervision/Assistance  Patient can return home with the following  A little help with walking and/or transfers;A little help with bathing/dressing/bathroom;Assistance with cooking/housework;Assist for transportation;Help with stairs or ramp for entrance    Equipment Recommendations None recommended by PT (pt already has a RW)  Recommendations for Other Services       Functional Status Assessment Patient has had a recent decline in their functional status and demonstrates the ability to make significant improvements in function in a reasonable and predictable amount of time.     Precautions / Restrictions Precautions Precautions: Fall Restrictions Weight Bearing Restrictions: No      Mobility  Bed Mobility Overal bed mobility: Modified Independent             General bed mobility comments: supine>sit with HOB slightly elevated    Transfers Overall transfer level: Needs assistance Equipment used: Straight cane Transfers: Sit to/from Stand Sit to Stand: Supervision           General transfer comment: close supervision, extra time for successful STS from EOB with [facility]    Ambulation/Gait Ambulation/Gait assistance: Min guard, Supervision Gait Distance (Feet): 3 Feet (+ 25 ft) Assistive device: Straight cane, Rolling walker (2 wheels) Gait Pattern/deviations: Decreased step length - left, Decreased step length - right, Decreased stride length Gait velocity: decreased     General Gait Details: Pt initiates gait with SPC but constantly reaching for/holding to furniture with other UE. Provided pt with RW & pt able to ambulate with close supervision.  Stairs  Wheelchair Mobility    Modified Rankin (Stroke Patients Only)       Balance Overall balance assessment: Needs assistance Sitting-balance support: Feet supported Sitting balance-Leahy Scale: Fair     Standing balance support: Single extremity supported, During  functional activity Standing balance-Leahy Scale: Poor                               Pertinent Vitals/Pain Pain Assessment Pain Assessment: No/denies pain    Home Living Family/patient expects to be discharged to:: Private residence Living Arrangements: Spouse/significant other;Children Available Help at Discharge: Family;Available PRN/intermittently Type of Home: House Home Access: Stairs to enter Entrance Stairs-Rails: Left Entrance Stairs-Number of Steps: 3   Home Layout: One level Home Equipment: Cane - single point;BSC/3in1;Shower seat;Rolling Walker (2 wheels) Additional Comments: Pt lives with wife who has dementia & pt cares for her. Daughter works during the day but can come by around 7pm.    Prior Function Prior Level of Function : Independent/Modified Independent;Driving             Mobility Comments: Pt ambulatory with SPC, still driving, caregiver for his wife who has dementia. ADLs Comments: Pt reports he cooks & cleans.     Hand Dominance        Extremity/Trunk Assessment   Upper Extremity Assessment Upper Extremity Assessment: RUE deficits/detail RUE Deficits / Details: RUE edema, white/purulent noted on crease of elbow but not actively draining    Lower Extremity Assessment Lower Extremity Assessment: Generalized weakness       Communication   Communication: No difficulties  Cognition Arousal/Alertness: Awake/alert Behavior During Therapy: WFL for tasks assessed/performed Overall Cognitive Status: Within Functional Limits for tasks assessed                                 General Comments: Pt somewhat emotional re: needing to get home to his wife.        General Comments      Exercises     Assessment/Plan    PT Assessment Patient needs continued PT services  PT Problem List Decreased strength;Decreased activity tolerance;Decreased balance;Decreased mobility;Decreased safety awareness;Decreased knowledge of  use of DME       PT Treatment Interventions DME instruction;Therapeutic exercise;Gait training;Balance training;Neuromuscular re-education;Stair training;Functional mobility training;Therapeutic activities;Patient/family education    PT Goals (Current goals can be found in the Care Plan section)  Acute Rehab PT Goals Patient Stated Goal: get better, return home to wife PT Goal Formulation: With patient Time For Goal Achievement: 12/09/22 Potential to Achieve Goals: Good    Frequency Min 3X/week     Co-evaluation               AM-PAC PT "6 Clicks" Mobility  Outcome Measure Help needed turning from your back to your side while in a flat bed without using bedrails?: None Help needed moving from lying on your back to sitting on the side of a flat bed without using bedrails?: None Help needed moving to and from a bed to a chair (including a wheelchair)?: A Little Help needed standing up from a chair using your arms (e.g., wheelchair or bedside chair)?: A Little Help needed to walk in hospital room?: A Little Help needed climbing 3-5 steps with a railing? : A Little 6 Click Score: 20    End of Session   Activity Tolerance: Patient tolerated treatment well Patient left:  in chair;with call bell/phone within reach;with chair alarm set Nurse Communication: Mobility status PT Visit Diagnosis: Muscle weakness (generalized) (M62.81);Unsteadiness on feet (R26.81);Difficulty in walking, not elsewhere classified (R26.2)    Time: 5697-9480 PT Time Calculation (min) (ACUTE ONLY): 21 min   Charges:   PT Evaluation $PT Eval Low Complexity: 1 Low          Aleda Grana, PT, DPT 11/25/22, 9:04 AM   Sandi Mariscal 11/25/2022, 9:01 AM

## 2022-11-25 NOTE — Plan of Care (Signed)

## 2022-11-25 NOTE — TOC Transition Note (Addendum)
Transition of Care [facility]) - CM/SW Discharge Note   Patient Details  Name: [name] [name] MRN: [id number] Date of Birth: Oct 02, 1940  Transition of Care [facility]) CM/SW Contact:  Bing Quarry, RN Phone Number: 11/25/2022, 10:27 AM   Clinical Narrative:  4/6: Discharging today. HH set up via [facility] after speaking with patient regarding choices and patient agreeable. Daughter will transport home and assists with transportation to appointments. Patient has RW at home, no other DME ordered. Daughter's contact number given to [facility] Rep.Cory.  Gabriel Cirri RN CM      Final next level of care: Home w Home Health Services Barriers to Discharge: Barriers Resolved   Patient Goals and CMS Choice CMS Medicare.gov Compare Post Acute Care list provided to:: Patient Choice offered to / list presented to : Patient  Discharge Placement                         Discharge Plan and Services Additional resources added to the After Visit Summary for     Discharge Planning Services: CM Consult Post Acute Care Choice: Home Health          DME Arranged: N/A DME Agency: NA       HH Arranged: PT, OT HH Agency: [facility] Date [facility] Agency Contacted: 11/25/22 Time HH Agency Contacted: 1014 Representative spoke with at [facility] Agency: Kandee Keen  Social Determinants of Health (SDOH) Interventions SDOH Screenings   Food Insecurity: No Food Insecurity (11/19/2022)  Housing: Low Risk  (11/19/2022)  Transportation Needs: No Transportation Needs (11/19/2022)  Utilities: Not At Risk (11/19/2022)  Tobacco Use: Low Risk  (11/21/2022)     Readmission Risk Interventions     No data to display

## 2022-11-25 NOTE — Progress Notes (Signed)
[facility]  PROGRESS NOTE   Subjective:   Patient seen at bedside.  Feels much better.  Objective:  Vital signs: Blood pressure (!) 104/52, pulse 84, temperature 97.8 F (36.6 C), temperature source Oral, resp. rate 18, height 5\' 9"  (1.753 m), weight 75.1 kg, SpO2 100 %.  Intake/Output Summary (Last 24 hours) at 11/25/2022 1315 Last data filed at 11/24/2022 2033 Gross per 24 hour  Intake 240 ml  Output 1650 ml  Net -1410 ml   Filed Weights   11/24/22 1316 11/24/22 1710 11/25/22 0412  Weight: 73 kg 71.9 kg 75.1 kg     Physical Exam: General:  No acute distress  Head:  Normocephalic, atraumatic. Moist oral mucosal membranes  Eyes:  Anicteric  Neck:  Supple  Lungs:   Clear to auscultation, normal effort  Heart:  S1S2 no rubs  Abdomen:   Soft, nontender, bowel sounds present  Extremities:  peripheral edema.  Neurologic:  Awake, alert, following commands  Skin:  No lesions  Access:     Basic Metabolic Panel: Recent Labs  Lab 11/20/22 0813 11/21/22 0425 11/22/22 1412 11/23/22 1029 11/24/22 0452 11/25/22 0513  NA 139 137 138 136 137 138  K 5.1 4.0 3.8 3.9 3.7 4.0  CL 102 99 98 95* 98 96*  CO2 23 24 25 27 25 27   GLUCOSE 108* 101* 108* 114* 99 110*  BUN 82* 40* 58* 35* 45* 28*  CREATININE 10.42* 6.36* 8.97* 6.33* 7.93* 5.63*  CALCIUM 9.4 9.8 9.8 10.2 10.1 10.0  PHOS 6.4*  --  6.5*  --   --   --    GFR: Estimated Creatinine Clearance: 10.5 mL/min (A) (by C-G formula based on SCr of 5.63 mg/dL (H)).  Liver Function Tests: Recent Labs  Lab 11/18/22 1700 11/20/22 0813 11/22/22 1412  AST 11*  --   --   ALT 7  --   --   ALKPHOS 74  --   --   BILITOT 0.7  --   --   PROT 7.2  --   --   ALBUMIN 3.4* 2.8* 2.8*   No results for input(s): "LIPASE", "AMYLASE" in the last 168 hours. No results for input(s): "AMMONIA" in the last 168 hours.  CBC: Recent Labs  Lab 11/18/22 1700 11/19/22 0108 11/21/22 0425 11/22/22 1412 11/23/22 1029 11/24/22 0452  11/25/22 0513  WBC 10.4   < > 6.6 7.7 8.7 7.9 9.1  NEUTROABS 6.7  --   --   --   --   --   --   HGB 9.8*   < > 10.5* 9.3* 9.8* 9.7* 9.7*  HCT 31.5*   < > 32.0* 28.7* 30.7* 30.4* 30.8*  MCV 96.6   < > 93.3 93.8 94.5 95.0 95.4  PLT 95*   < > 114* 124* 142* 130* 129*   < > = values in this interval not displayed.     HbA1C: Hgb A1c MFr Bld  Date/Time Value Ref Range Status  11/18/2022 01:08 AM 5.6 4.8 - 5.6 % Final    Comment:    (NOTE)         Prediabetes: 5.7 - 6.4         Diabetes: >6.4         Glycemic control for adults with diabetes: <7.0     Urinalysis: No results for input(s): "COLORURINE", "LABSPEC", "PHURINE", "GLUCOSEU", "HGBUR", "BILIRUBINUR", "KETONESUR", "PROTEINUR", "UROBILINOGEN", "NITRITE", "LEUKOCYTESUR" in the last 72 hours.  Invalid input(s): "APPERANCEUR"    Imaging: PERIPHERAL  VASCULAR CATHETERIZATION  Result Date: 11/24/2022 See surgical note for result.    Medications:    vancomycin 750 mg (11/24/22 1835)    amLODipine  10 mg Oral BH-q7a   calcitRIOL  0.75 mcg Oral Q M,W,F   Chlorhexidine Gluconate Cloth  6 each Topical Daily   Chlorhexidine Gluconate Cloth  6 each Topical Once   cinacalcet  30 mg Oral Q M,W,F   heparin injection (subcutaneous)  5,000 Units Subcutaneous Q8H   insulin aspart  0-5 Units Subcutaneous QHS   insulin aspart  0-6 Units Subcutaneous TID WC   metoprolol tartrate  25 mg Oral BID   pravastatin  20 mg Oral q1800    Assessment/ Plan:     82 year old male with history of hypertension, diabetes, hyperlipidemia, end-stage renal disease on dialysis on Monday Wednesday Friday schedule.  He was admitted for right internal jugular vein thrombosis.  ESRD on hemodialysis.  Patient is stable dialysis via right femoral tunneled catheter yesterday.  Hypertension: Blood pressure is well-controlled at this time.  Anemia: Continue anemia protocol.  Patient is scheduled for discharge today.  Advised to follow-up as  outpatient.    LOS: 7 Lorain Childes, MD [facility] 4/6/20241:15 PM

## 2022-11-27 ENCOUNTER — Encounter: Payer: Self-pay | Admitting: Vascular Surgery

## 2022-11-29 ENCOUNTER — Emergency Department
Admission: EM | Admit: 2022-11-29 | Discharge: 2022-11-29 | Disposition: A | Discharge status: Home / Self Care | Payer: Medicare Other | Attending: Emergency Medicine | Admitting: Emergency Medicine

## 2022-11-29 ENCOUNTER — Other Ambulatory Visit: Payer: Self-pay

## 2022-11-29 DIAGNOSIS — Z992 Dependence on renal dialysis: Secondary | ICD-10-CM | POA: Insufficient documentation

## 2022-11-29 DIAGNOSIS — T827XXA Infection and inflammatory reaction due to other cardiac and vascular devices, implants and grafts, initial encounter: Secondary | ICD-10-CM | POA: Diagnosis present

## 2022-11-29 DIAGNOSIS — N186 End stage renal disease: Secondary | ICD-10-CM | POA: Diagnosis not present

## 2022-11-29 DIAGNOSIS — Y69 Unspecified misadventure during surgical and medical care: Secondary | ICD-10-CM | POA: Insufficient documentation

## 2022-11-29 DIAGNOSIS — E1122 Type 2 diabetes mellitus with diabetic chronic kidney disease: Secondary | ICD-10-CM | POA: Diagnosis not present

## 2022-11-29 DIAGNOSIS — T827XXS Infection and inflammatory reaction due to other cardiac and vascular devices, implants and grafts, sequela: Secondary | ICD-10-CM

## 2022-11-29 DIAGNOSIS — I120 Hypertensive chronic kidney disease with stage 5 chronic kidney disease or end stage renal disease: Secondary | ICD-10-CM | POA: Insufficient documentation

## 2022-11-29 LAB — COMPREHENSIVE METABOLIC PANEL
ALT: 7 U/L (ref 0–44)
AST: 20 U/L (ref 15–41)
Albumin: 3.3 g/dL — ABNORMAL LOW (ref 3.5–5.0)
Alkaline Phosphatase: 66 U/L (ref 38–126)
Anion gap: 13 (ref 5–15)
BUN: 28 mg/dL — ABNORMAL HIGH (ref 8–23)
CO2: 25 mmol/L (ref 22–32)
Calcium: 9.5 mg/dL (ref 8.9–10.3)
Chloride: 101 mmol/L (ref 98–111)
Creatinine, Ser: 5.9 mg/dL — ABNORMAL HIGH (ref 0.61–1.24)
GFR, Estimated: 9 mL/min — ABNORMAL LOW (ref >60)
Glucose, Bld: 114 mg/dL — ABNORMAL HIGH (ref 70–99)
Potassium: 3.6 mmol/L (ref 3.5–5.1)
Sodium: 139 mmol/L (ref 135–145)
Total Bilirubin: 0.9 mg/dL (ref 0.3–1.2)
Total Protein: 7.1 g/dL (ref 6.5–8.1)

## 2022-11-29 LAB — CBC WITH DIFFERENTIAL/PLATELET
Abs Immature Granulocytes: 0.11 10*3/uL — ABNORMAL HIGH (ref 0.00–0.07)
Basophils Absolute: 0.1 10*3/uL (ref 0.0–0.1)
Basophils Relative: 0 %
Eosinophils Absolute: 0.3 10*3/uL (ref 0.0–0.5)
Eosinophils Relative: 2 %
HCT: 29.3 % — ABNORMAL LOW (ref 39.0–52.0)
Hemoglobin: 8.9 g/dL — ABNORMAL LOW (ref 13.0–17.0)
Immature Granulocytes: 1 %
Lymphocytes Relative: 25 %
Lymphs Abs: 2.8 10*3/uL (ref 0.7–4.0)
MCH: 30.1 pg (ref 26.0–34.0)
MCHC: 30.4 g/dL (ref 30.0–36.0)
MCV: 99 fL (ref 80.0–100.0)
Monocytes Absolute: 0.9 10*3/uL (ref 0.1–1.0)
Monocytes Relative: 8 %
Neutro Abs: 7.3 10*3/uL (ref 1.7–7.7)
Neutrophils Relative %: 64 %
Platelets: 110 10*3/uL — ABNORMAL LOW (ref 150–400)
RBC: 2.96 MIL/uL — ABNORMAL LOW (ref 4.22–5.81)
RDW: 15.7 % — ABNORMAL HIGH (ref 11.5–15.5)
WBC: 11.4 10*3/uL — ABNORMAL HIGH (ref 4.0–10.5)
nRBC: 0 % (ref 0.0–0.2)

## 2022-11-29 MED ORDER — BACITRACIN ZINC 500 UNIT/GM EX OINT: TOPICAL_OINTMENT | Freq: Once | CUTANEOUS | Status: AC

## 2022-11-29 MED ORDER — ACETAMINOPHEN 500 MG PO TABS
ORAL_TABLET | ORAL | Status: AC
  Filled 2022-11-29: qty 2

## 2022-11-29 MED ORDER — ACETAMINOPHEN 500 MG PO TABS: 1000.0000 mg | ORAL_TABLET | Freq: Once | ORAL | Status: AC

## 2022-11-29 MED ORDER — BACITRACIN ZINC 500 UNIT/GM EX OINT
TOPICAL_OINTMENT | CUTANEOUS | Status: AC
  Filled 2022-11-29: qty 1.8

## 2022-11-29 MED ORDER — ACETAMINOPHEN 500 MG PO TABS
ORAL_TABLET | ORAL | Status: AC
  Administered 2022-11-29: 1000 mg via ORAL
  Filled 2022-11-29: qty 2

## 2022-11-29 MED ORDER — BACITRACIN 500 UNIT/GM EX OINT: 1.0000 | TOPICAL_OINTMENT | Freq: Two times a day (BID) | CUTANEOUS | 0 refills | Status: DC

## 2022-11-29 MED ORDER — VANCOMYCIN HCL 750 MG/150ML IV SOLN
750.0000 mg | Freq: Once | INTRAVENOUS | Status: AC
  Administered 2022-11-29: 750 mg via INTRAVENOUS
  Filled 2022-11-29 (×2): qty 150

## 2022-11-29 NOTE — Discharge Instructions (Addendum)
You are seen in the emergency department for pain in your right arm.  You did not complete your dialysis today.  You were given your dose of IV vancomycin since you did not receive it in dialysis.  Your wound is healing, continue to apply bacitracin twice daily and keep it wrapped.  Follow-up with vascular surgery in 1 week with Dr. Wyn Quaker.

## 2022-11-29 NOTE — ED Notes (Signed)
Pt's hemodialysis graft wounds wrapped with telfa and bacitracin and curlex. Pt. Educated on keeping wound clean and dry, and changing dressing when at home.

## 2022-11-29 NOTE — ED Provider Notes (Signed)
Care assumed of patient from outgoing provider.  See their note for initial history, exam and plan.  Clinical Course as of 11/29/22 1546  Wed Nov 29, 2022  1506 ESRD with recent AVF placed with infection and gets HD with R fem perm cath.  Patient with arm/wound pain today in HD.  Vascular surgery to eval the AVF.  [SM]    Clinical Course User Index [SM] Corena Herter, MD   Vascular surgery reviewed images.  Stated that it looked improved when compared to prior.  Recommended bacitracin and wrapping and follow-up in 1 week as an outpatient.  Ultrasound-guided IV placed to receive IV vancomycin.  PROCEDURE NOTE: IV Placement under Ultrasound Guidance Performed by: Corena Herter, MD (myself) Indication: IV access required. Multiple attempts at peripheral IV placement were made by the nursing staff without success   Procedure: The area was prepped in the usual fashion. The left AC was cannulated with a 20-gauge angiocath with use of dynamic  ultrasound to identify the vein. The patient tolerated the procedure  well. Imaging not saved.  Complications: none   Corena Herter, MD 11/29/22 1547

## 2022-11-29 NOTE — ED Notes (Signed)
This RN to bedside to draw blood and start IV. Pt. Is cooperative and chatting with this RN. Pt. And family Updated on POC by this RN. This RN notified pt. That she was able to draw blood, but was unable to obtain IV, so IV team would have to be consulted as pt's wound required IV antibiotics. Pt. Agreed. As this RN was preparing to leave the room, pt. Began complaining about right leg pain, and being cold. This RN stated that she would get him another warm blanket, and asked if this pain was new or if he had it sometimes, and pt. Began raising his voice and yelling at this RN that the pain was old. Pt's behavior and tone escalating quickly. This RN attempted to verbally deescalate pt, offering pain medication and asking what he normally took at home for this pain. Pt. States tylenol, and this RN offers to get him some tylenol. Pt. Raising his voice more and begins yelling about how, "ya'll left me here in this bed, all uncomfortable, who would do that! My leg all twisted up!I'm leaving! I'm getting Arlan Organ here! Ya'll leavin me here!"  This RN apologized for the wait, and asked if she could get him a warm blanket as he said he was cold earlier. Pt. Insists he is leaving. Family at bedside, attempting to convince pt. To stay in the bed. This RN notifies Dr. Sidney Ace of pt's current state, desire to leave, and quick escalation of behavior and emotion.

## 2022-11-29 NOTE — ED Notes (Signed)
ED Provider at bedside. 

## 2022-11-29 NOTE — ED Notes (Signed)
Dr. Sidney Ace and this RN at bedside attempting to deescalate pt, and explain POC to him. This RN obtained VO for tylenol for pt. And got it for him. Pt. Refusing to speak to this RN, or take tylenol, while yelling loudly at dr. Sidney Ace and attempting to push past her and leave with his walker. Dr. Sidney Ace states to this RN that he should not leave until vascular is consulted, and the vascular MD is currently in the OR. Security called to bedside. Pt. Unwilling to listen to Dr. Sidney Ace, yelling overtop and speaking only to security officer at bedside. Pt. Is finally convinced to sit back down by family member and Engineer, materials. Engineer, materials convinces pt. To eventually take some tylenol, (see MAR.). pt's family member states he has to leave to pick up kids. Charge RN notified pt. Needs a Comptroller. No sitter available. Security supervision staying close to room in case he is needed.

## 2022-11-29 NOTE — ED Notes (Signed)
This RN called pharmacy about vanc. States will send to flex.

## 2022-11-29 NOTE — ED Triage Notes (Signed)
Pt states that he had a procedure done to his dialysis fistula in the right upper arm last week, pt states that he had dialysis treatment today and states that the arm began to swell and is concerned that it may be getting infected. Pt states that he had the full treatment and then was going to get a dose of antibiotic through the fistula but the machine stopped working and the dialysis center lost power

## 2022-11-29 NOTE — ED Notes (Signed)
This RN attempted IV insertion, unable to start IV, blood draw accomplished. Will consult IV team.

## 2022-11-29 NOTE — ED Provider Notes (Signed)
[facility] Provider Note    Event Date/Time   First MD Initiated Contact with Patient 11/29/22 1324     (approximate)   History   Vascular Access Problem   HPI  Lucius Bier is a 82 y.o. male  past medical history of end-stage renal disease who presents with for arm pain.  Patient was in dialysis today was getting his session when he felt like the right arm became more swollen and painful.  He feels like the AV fistula site is more infected and was draining some yesterday.  Patient had a AV fistula placed by Dr. Wyn Quaker on 3/7.  Presented to [facility] on 3/30 with significant right arm swelling was found to have a right SVC thrombosis associated with his HD catheter.  Catheter has since been removed and he is now getting dialysis through a femoral catheter.  There was some concern for infection of the AV fistula site and he had a wound culture from some drainage at the site that grew MRSA.  He is currently receiving vancomycin Monday Wednesday Friday with dialysis.  He did not receive his antibiotic today because he did not complete his session.  Called over to [facility] apparently patient was agitated during dialysis in 2 hours and decided he did not want to finish the session and want to go to the hospital.  The nurse I spoke with did not see the AV fistula site on Monday but she did show a picture to another nurse who had stated on Monday who thought it looked worse today.  Patient denies fevers or chills.  Past Medical History:  Diagnosis Date   Anemia    Arthritis    Cancer    Chronic kidney disease    Diabetes mellitus without complication    ESRD (end stage renal disease)    GERD (gastroesophageal reflux disease)    h/o   Gout    Hyperlipidemia, mixed    Hypertension    Hypokalemia    Renal mass    Secondary hyperparathyroidism of renal origin    Sleep apnea    h/o no cpap   Thrombocytopenia     Patient Active Problem List   Diagnosis Date  Noted   Internal jugular (IJ) vein thromboembolism, acute, right 11/18/2022   Postoperative complication of skin involving drainage from surgical wound from right arm AVF placement 11/18/2022   Cellulitis of right arm 11/18/2022   S/P arteriovenous (AV) fistula creation 10/26/22 11/18/2022   ESRD (end stage renal disease) on dialysis 04/14/2022   Gout 04/14/2022   Acute lower UTI 04/14/2022   Peritoneal dialysis catheter dysfunction 04/14/2022   Acute on chronic anemia 04/05/2022   PD catheter dysfunction    Hypokalemia 12/19/2021   ESRD on dialysis    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    Chronic kidney disease, stage V 09/20/2021   Seasonal allergic rhinitis due to pollen 12/15/2020   Ventral hernia without obstruction or gangrene 12/15/2020   Proteinuria 06/14/2019   Secondary hyperparathyroidism of renal origin 06/14/2019   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   Class 1 obesity due to excess calories with serious comorbidity and body mass index (BMI) of 34.0 to 34.9 in adult 03/28/2019   Benign essential hypertension 03/28/2019   History of gout 03/28/2019   Mixed hyperlipidemia 03/28/2019   Diabetes mellitus, type II 03/28/2019   Essential thrombocytopenia 06/28/2016   Thrombocytopenia 05/07/2016   Chronic kidney disease, stage IV (severe) 05/07/2016  Physical Exam  Triage Vital Signs: ED Triage Vitals [11/29/22 1321]  Enc Vitals Group     BP (!) 131/58     Pulse Rate 88     Resp 18     Temp 97.9 F (36.6 C)     Temp Source Oral     SpO2 100 %     Weight 165 lb 5.5 oz (75 kg)     Height 5\' 9"  (1.753 m)     Head Circumference      Peak Flow      Pain Score 6     Pain Loc      Pain Edu?      Excl. in GC?     Most recent vital signs: Vitals:   11/29/22 1321  BP: (!) 131/58  Pulse: 88  Resp: 18  Temp: 97.9 F (36.6 C)  SpO2: 100%     General: Awake, no distress.  CV:  Good peripheral perfusion.  Resp:  Normal effort.   Abd:  No distention.  Neuro:             Awake, Alert, Oriented x 3  Other:  Arm is slightly more swollen than left Right AV fistula site with overlying ulceration? No drainage, no erythema, good thrill     ED Results / Procedures / Treatments  Labs (all labs ordered are listed, but only abnormal results are displayed) Labs Reviewed  CBC WITH DIFFERENTIAL/PLATELET - Abnormal; Notable for the following components:      Result Value   WBC 11.4 (*)    RBC 2.96 (*)    Hemoglobin 8.9 (*)    HCT 29.3 (*)    RDW 15.7 (*)    Platelets 110 (*)    Abs Immature Granulocytes 0.11 (*)    All other components within normal limits  COMPREHENSIVE METABOLIC PANEL     EKG     RADIOLOGY    PROCEDURES:  Critical Care performed: No  Procedures   MEDICATIONS ORDERED IN ED: Medications  vancomycin (VANCOREADY) IVPB 750 mg/150 mL (has no administration in time range)  acetaminophen (TYLENOL) 500 MG tablet (has no administration in time range)     IMPRESSION / MDM / ASSESSMENT AND PLAN / ED COURSE  I reviewed the triage vital signs and the nursing notes.                              Patient's presentation is most consistent with acute complicated illness / injury requiring diagnostic workup.  Differential diagnosis includes, but is not limited to, infected fistula, granulation tissue, cellulitis   Patient is a 82 year old male with end-stage renal disease who presents with concern for pain and swelling and recently AV fistula site.  Patient AV fistula placed on 3/7 then developed a upper extremity thrombus related to his IJ catheter which is since been removed.  He is currently being treated for a infection of the fistula as well as he had MRSA growing from all superficial wound culture.  Today during dialysis apparently he got somewhat agitated and left dialysis after just 2 hours.  Tells me that his arm is more swollen and painful.  I spoke with [facility] dialysis nurse tells me that  she has not seen the wound before but she showed a picture to her nurse was seen on Monday and it did look to be somewhat worse.  Patient's fistula has a good thrill there is an area  with some granulation tissue and questionable ulceration there is no active drainage no significant surrounding cellulitis.  Question whether this is healing appropriately or is worsening.  I would like vascular to look at it.  Given patient did not receive full dialysis session will send some labs as well.  I had ordered patient's IV vancomycin that he did not get today at dialysis because he left early.  After patient's lab draw he became acutely agitated.  Saying his legs were in pain and was very angry that he was sitting in the stretcher and his legs were hanging off the bed.  Attempted to verbally de-escalate but patient would not let me talk.  Kept yelling about his pain and wanting to leave the hospital.  I was not able to have the capacity discussion with him because he will not even let me speak.  We were able to de-escalate him and get him back into the stretcher with the help of security.  Patient's son does tell me that this is not atypical for him that he does not think he is altered but he just gets agitated intermittently.  I did contact Dr. Gilda CreaseSchnier with vascular who is currently in the OR.  Spoke with our nurse.  I sent him a secure chat message.  Patient signed out to oncoming provider pending discussion with vascular as well as CBC and BMP.   Clinical Course as of 11/29/22 1519  Wed Nov 29, 2022  1506 ESRD with recent AVF placed with infection and gets HD with R fem perm cath.  Patient with arm/wound pain today in HD.  Vascular surgery to eval the AVF.  [SM]    Clinical Course User Index [SM] Corena HerterMumma, Shannon, MD     FINAL CLINICAL IMPRESSION(S) / ED DIAGNOSES   Final diagnoses:  AV fistula infection, sequela     Rx / DC Orders   ED Discharge Orders     None        Note:  This document  was prepared using Dragon voice recognition software and may include unintentional dictation errors.   Georga HackingMcHugh, Mckinnon Glick Rose, MD 11/29/22 [phone]

## 2022-12-01 ENCOUNTER — Encounter: Payer: Self-pay | Admitting: Oncology

## 2022-12-05 ENCOUNTER — Ambulatory Visit (INDEPENDENT_AMBULATORY_CARE_PROVIDER_SITE_OTHER): Payer: Medicare Other | Admitting: Vascular Surgery

## 2022-12-05 ENCOUNTER — Ambulatory Visit (INDEPENDENT_AMBULATORY_CARE_PROVIDER_SITE_OTHER): Payer: Medicare Other

## 2022-12-05 ENCOUNTER — Encounter (INDEPENDENT_AMBULATORY_CARE_PROVIDER_SITE_OTHER): Payer: Self-pay | Admitting: Nurse Practitioner

## 2022-12-05 VITALS — BP 129/56 | HR 80 | Resp 16 | Ht 69.0 in | Wt 171.0 lb

## 2022-12-05 DIAGNOSIS — E1122 Type 2 diabetes mellitus with diabetic chronic kidney disease: Secondary | ICD-10-CM

## 2022-12-05 DIAGNOSIS — Z9889 Other specified postprocedural states: Secondary | ICD-10-CM | POA: Diagnosis not present

## 2022-12-05 DIAGNOSIS — N186 End stage renal disease: Secondary | ICD-10-CM

## 2022-12-05 DIAGNOSIS — Z992 Dependence on renal dialysis: Secondary | ICD-10-CM

## 2022-12-05 DIAGNOSIS — I10 Essential (primary) hypertension: Secondary | ICD-10-CM

## 2022-12-05 NOTE — Assessment & Plan Note (Signed)
blood pressure control important in reducing the progression of atherosclerotic disease. On appropriate oral medications.  

## 2022-12-05 NOTE — Assessment & Plan Note (Signed)
Currently using right femoral PermCath.  Right arm AV fistula should be ready to use as soon as his wounds heal and the drainage stops.  Will recheck him in 2 to 3 weeks to reassess the wound.

## 2022-12-05 NOTE — Assessment & Plan Note (Signed)
blood glucose control important in reducing the progression of atherosclerotic disease. Also, involved in wound healing. On appropriate medications.  

## 2022-12-05 NOTE — Progress Notes (Signed)
Patient ID: [name] [name], male   DOB: 1940/12/08, 82 y.o.   MRN: [id number]  Chief Complaint  Patient presents with   Follow-up    ultrasound    HPI [name] [name] is a 82 y.o. male.  Patient returns in follow-up.  His right arm AV fistula has still not yet been used for dialysis.  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  The PermCath is now in the groin and is working fine.  His arm swelling is markedly decreased after intervention, but he does still have some serous drainage from 2 small openings in the incision.  Duplex today shows the AV fistula to be widely patent.   Past Medical History:  Diagnosis Date   Anemia    Arthritis    Cancer    Chronic kidney disease    Diabetes mellitus without complication    ESRD (end stage renal disease)    GERD (gastroesophageal reflux disease)    h/o   Gout    Hyperlipidemia, mixed    Hypertension    Hypokalemia    Renal mass    Secondary hyperparathyroidism of renal origin    Sleep apnea    h/o no cpap   Thrombocytopenia     Past Surgical History:  Procedure Laterality Date   APPENDECTOMY     AV FISTULA PLACEMENT Right 10/26/2022   Procedure: ARTERIOVENOUS (AV) FISTULA CREATION;  Surgeon: Annice Needy, MD;  Location: [facility] ORS;  Service: Vascular;  Laterality: Right;   Bunion Removal Right    CAPD INSERTION N/A 11/23/2021   Procedure: LAPAROSCOPIC INSERTION CONTINUOUS AMBULATORY PERITONEAL DIALYSIS  (CAPD) CATHETER;  Surgeon: Henrene Dodge, MD;  Location: [facility] OR[facility];  Service: General;  Laterality: N/A;   CAPD REVISION N/A 01/31/2022   Procedure: LAPAROSCOPIC REVISION CONTINUOUS AMBULATORY PERITONEAL DIALYSIS  (CAPD) CATHETER;  Surgeon: Henrene Dodge, MD;  Location: [facility] ORS;  Service: General;  Laterality: N/A;   CAPD REVISION N/A 04/06/2022   Procedure: LAPAROSCOPIC REVISION CONTINUOUS AMBULATORY PERITONEAL DIALYSIS  (CAPD) CATHETER, replacement;  Surgeon:  Henrene Dodge, MD;  Location: [facility] OR[facility];  Service: General;  Laterality: N/A;   CAPD REVISION N/A 04/14/2022   Procedure: LAPAROSCOPIC ASSISTED CONTINUOUS AMBULATORY PERITONEAL DIALYSIS  (CAPD) CATHETER REMOVAL;  Surgeon: Henrene Dodge, MD;  Location: [facility];  Service: General;  Laterality: N/A;   COLONOSCOPY WITH PROPOFOL N/A 06/14/2015   Procedure: COLONOSCOPY WITH PROPOFOL;  Surgeon: Christena Deem, MD;  Location: [facility];  Service: Endoscopy;  Laterality: N/A;   DIALYSIS/PERMA CATHETER INSERTION N/A 10/03/2021   Procedure: DIALYSIS/PERMA CATHETER INSERTION;  Surgeon: Annice Needy, MD;  Location: [facility] INVASIVE CV [facility];  Service: Cardiovascular;  Laterality: N/A;   EYE SURGERY     HERNIA REPAIR     abd   INSERTION OF MESH  11/23/2021   Procedure: INSERTION OF MESH;  Surgeon: Henrene Dodge, MD;  Location: [facility] OR[facility];  Service: General;;   PROSTATE BIOPSY N/A 03/27/2017   Procedure: PROSTATE BIOPSY-URO NAV;  Surgeon: Orson Ape, MD;  Location: [facility] OR[facility];  Service: Urology;  Laterality: N/A;   PROSTATE BIOPSY N/A 09/11/2019   Procedure: PROSTATE BIOPSY Michell Heinrich;  Surgeon: Orson Ape, MD;  Location: [facility];  Service: Urology;  Laterality: N/A;   RETINAL DETACHMENT SURGERY Left    TRANSURETHRAL RESECTION OF PROSTATE     UPPER EXTREMITY VENOGRAPHY Right 11/20/2022   Procedure: UPPER EXTREMITY VENOGRAPHY;  Surgeon: [name]  S, MD;  Location: [facility];  Service: Cardiovascular;  Laterality: Right;   UPPER EXTREMITY VENOGRAPHY Right 11/24/2022   Procedure: UPPER EXTREMITY VENOGRAPHY;  Surgeon: Annice Needy, MD;  Location: [facility];  Service: Cardiovascular;  Laterality: Right;   XI ROBOTIC ASSISTED VENTRAL HERNIA N/A 11/23/2021   Procedure: XI ROBOTIC ASSISTED VENTRAL HERNIA, incisional;  Surgeon: Henrene Dodge, MD;  Location: [facility];  Service: General;  Laterality: N/A;      Allergies  Allergen Reactions   Fish Allergy Swelling   Morphine Other  (See Comments)    About 5-10 minutes after admin of 2 mg morphine IV, patient became very agitated and tearful, loudly expressing desire to "be taken by Jesus", unable to reason with or calm patient   Ciprofloxacin     Unknown reaction   Doxazosin Other (See Comments)    unknown    Current Outpatient Medications  Medication Sig Dispense Refill   acetaminophen (TYLENOL) 500 MG tablet Take 2 tablets (1,000 mg total) by mouth every 6 (six) hours as needed for mild pain. (Patient taking differently: Take 1,000 mg by mouth 2 (two) times daily.)     amLODipine (NORVASC) 10 MG tablet Take 10 mg by mouth every morning.     aspirin EC 81 MG tablet Take 81 mg by mouth daily. Swallow whole.     azelastine (ASTELIN) 0.1 % nasal spray Place 1 spray into both nostrils 2 (two) times daily as needed for rhinitis. Use in each nostril as directed     bacitracin 500 UNIT/GM ointment Apply 1 Application topically 2 (two) times daily. To right AV fistula wound 15 g 0   bicalutamide (CASODEX) 50 MG tablet Take 50 mg by mouth every morning.     cinacalcet (SENSIPAR) 30 MG tablet Take 30 mg by mouth daily.     enalapril (VASOTEC) 20 MG tablet Take 20 mg by mouth 2 (two) times daily.      fluticasone (FLONASE) 50 MCG/ACT nasal spray Place 1 spray into both nostrils daily as needed for allergies or rhinitis.     HYDROcodone-acetaminophen (NORCO/VICODIN) 5-325 MG tablet Take 2 tablets by mouth every 6 (six) hours as needed for moderate pain. 20 tablet 0   lovastatin (MEVACOR) 20 MG tablet Take 20 mg by mouth at bedtime.     metoprolol (LOPRESSOR) 50 MG tablet Take 25 mg by mouth 2 (two) times daily.     polyethylene glycol (MIRALAX / GLYCOLAX) 17 g packet Take 17 g by mouth daily as needed.     sodium bicarbonate 650 MG tablet Take 1,300 mg by mouth 2 (two) times daily.     ULORIC 40 MG tablet Take 40 mg by mouth every morning.     vancomycin (VANCOREADY) 750 MG/150ML SOLN Inject 150 mLs (750 mg total) into the vein  every Monday, Wednesday, and Friday with hemodialysis.     No current facility-administered medications for this visit.        Physical Exam BP (!) 129/56 (BP Location: Left Arm)   Pulse 80   Resp 16   Ht 5\' 9"  (1.753 m)   Wt 171 lb (77.6 kg)   BMI 25.25 kg/m  Gen:  WD/WN, NAD Skin: incision with 2 small areas of superficial opening.  No erythema.  Serous drainage is present.  The arm itself is far less swollen than it was before central venous intervention 2 weeks ago.  PermCath is now in the right groin.  Assessment/Plan:  ESRD on dialysis [facility]) Currently using right femoral PermCath.  Right arm AV fistula should be ready to use as soon as his wounds heal and the drainage stops.  Will recheck him in 2 to 3 weeks to reassess the wound.  Diabetes mellitus, type II (HCC) blood glucose control important in reducing the progression of atherosclerotic disease. Also, involved in wound healing. On appropriate medications.   Benign essential hypertension blood pressure control important in reducing the progression of atherosclerotic disease. On appropriate oral medications.      [name] 12/05/2022, 2:16 PM   This note was created with Dragon medical transcription system.  Any errors from dictation are unintentional.

## 2022-12-19 ENCOUNTER — Ambulatory Visit (INDEPENDENT_AMBULATORY_CARE_PROVIDER_SITE_OTHER): Payer: Medicare Other | Admitting: Vascular Surgery

## 2022-12-19 ENCOUNTER — Encounter (INDEPENDENT_AMBULATORY_CARE_PROVIDER_SITE_OTHER): Payer: Self-pay | Admitting: Nurse Practitioner

## 2022-12-19 ENCOUNTER — Ambulatory Visit (INDEPENDENT_AMBULATORY_CARE_PROVIDER_SITE_OTHER): Payer: Medicare Other | Admitting: Nurse Practitioner

## 2022-12-19 VITALS — BP 132/66 | HR 72 | Resp 15 | Wt 175.0 lb

## 2022-12-19 DIAGNOSIS — Z9889 Other specified postprocedural states: Secondary | ICD-10-CM

## 2022-12-20 ENCOUNTER — Encounter (INDEPENDENT_AMBULATORY_CARE_PROVIDER_SITE_OTHER): Payer: Self-pay | Admitting: Nurse Practitioner

## 2022-12-20 NOTE — Progress Notes (Signed)
Subjective:    Patient ID: [name] [name], male    DOB: 12/27/1940, 82 y.o.   MRN: [id number] Chief Complaint  Patient presents with   Follow-up    2 week follow up    [name] [name] is a 82 y.o. male.  Patient returns in follow-up.  His right arm AV fistula has still not yet been used for dialysis.  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  The PermCath is now in the groin and is working fine.  His arm swelling is markedly decreased after intervention, but he does still have some serous drainage from 1 small openings in the incision.  He continues to have a good thrill and bruit.       Review of Systems  Skin:  Positive for wound.  All other systems reviewed and are negative.      Objective:   Physical Exam Vitals reviewed.  HENT:     Head: Normocephalic.  Cardiovascular:     Rate and Rhythm: Normal rate.     Pulses: Normal pulses.  Pulmonary:     Effort: Pulmonary effort is normal.  Skin:    General: Skin is warm and dry.  Neurological:     Mental Status: He is alert and oriented to person, place, and time.  Psychiatric:        Mood and Affect: Mood normal.        Behavior: Behavior normal.        Thought Content: Thought content normal.        Judgment: Judgment normal.     BP 132/66 (BP Location: Left Arm)   Pulse 72   Resp 15   Wt 175 lb (79.4 kg)   BMI 25.84 kg/m   Past Medical History:  Diagnosis Date   Anemia    Arthritis    Cancer (HCC)    Chronic kidney disease    Diabetes mellitus without complication (HCC)    ESRD (end stage renal disease) (HCC)    GERD (gastroesophageal reflux disease)    h/o   Gout    Hyperlipidemia, mixed    Hypertension    Hypokalemia    Renal mass    Secondary hyperparathyroidism of renal origin (HCC)    Sleep apnea    h/o no cpap   Thrombocytopenia (HCC)     Social History   Socioeconomic History   Marital status: Married     Spouse name: Eber Jones   Number of children: Not on file   Years of education: Not on file   Highest education level: Not on file  Occupational History   Not on file  Tobacco Use   Smoking status: Never   Smokeless tobacco: Never  Vaping Use   Vaping Use: Never used  Substance and Sexual Activity   Alcohol use: Not Currently   Drug use: No   Sexual activity: Not on file  Other Topics Concern   Not on file  Social History Narrative   Not on file   Social Determinants of Health   Financial Resource Strain: Not on file  Food Insecurity: No Food Insecurity (11/19/2022)   Hunger Vital Sign    Worried About Running Out of Food in the Last Year: Never true    Ran Out of Food in the Last Year: Never true  Transportation Needs: No Transportation Needs (11/19/2022)   PRAPARE - Administrator, Civil Service (Medical):  No    Lack of Transportation (Non-Medical): No  Physical Activity: Not on file  Stress: Not on file  Social Connections: Not on file  Intimate Partner Violence: Not At Risk (11/19/2022)   Humiliation, Afraid, Rape, and Kick questionnaire    Fear of Current or Ex-Partner: No    Emotionally Abused: No    Physically Abused: No    Sexually Abused: No    Past Surgical History:  Procedure Laterality Date   APPENDECTOMY     AV FISTULA PLACEMENT Right 10/26/2022   Procedure: ARTERIOVENOUS (AV) FISTULA CREATION;  Surgeon: Annice Needy, MD;  Location: [facility];  Service: Vascular;  Laterality: Right;   Bunion Removal Right    CAPD INSERTION N/A 11/23/2021   Procedure: LAPAROSCOPIC INSERTION CONTINUOUS AMBULATORY PERITONEAL DIALYSIS  (CAPD) CATHETER;  Surgeon: Henrene Dodge, MD;  Location: [facility];  Service: General;  Laterality: N/A;   CAPD REVISION N/A 01/31/2022   Procedure: LAPAROSCOPIC REVISION CONTINUOUS AMBULATORY PERITONEAL DIALYSIS  (CAPD) CATHETER;  Surgeon: Henrene Dodge, MD;  Location: [facility];  Service: General;  Laterality: N/A;   CAPD REVISION N/A  04/06/2022   Procedure: LAPAROSCOPIC REVISION CONTINUOUS AMBULATORY PERITONEAL DIALYSIS  (CAPD) CATHETER, replacement;  Surgeon: Henrene Dodge, MD;  Location: [facility];  Service: General;  Laterality: N/A;   CAPD REVISION N/A 04/14/2022   Procedure: LAPAROSCOPIC ASSISTED CONTINUOUS AMBULATORY PERITONEAL DIALYSIS  (CAPD) CATHETER REMOVAL;  Surgeon: Henrene Dodge, MD;  Location: [facility];  Service: General;  Laterality: N/A;   COLONOSCOPY WITH PROPOFOL N/A 06/14/2015   Procedure: COLONOSCOPY WITH PROPOFOL;  Surgeon: Christena Deem, MD;  Location: [facility] ENDOSCOPY;  Service: Endoscopy;  Laterality: N/A;   DIALYSIS/PERMA CATHETER INSERTION N/A 10/03/2021   Procedure: DIALYSIS/PERMA CATHETER INSERTION;  Surgeon: Annice Needy, MD;  Location: [facility];  Service: Cardiovascular;  Laterality: N/A;   EYE SURGERY     HERNIA REPAIR     abd   INSERTION OF MESH  11/23/2021   Procedure: INSERTION OF MESH;  Surgeon: Henrene Dodge, MD;  Location: [facility];  Service: General;;   PROSTATE BIOPSY N/A 03/27/2017   Procedure: PROSTATE BIOPSY-URO NAV;  Surgeon: Orson Ape, MD;  Location: [facility];  Service: Urology;  Laterality: N/A;   PROSTATE BIOPSY N/A 09/11/2019   Procedure: PROSTATE BIOPSY Michell Heinrich;  Surgeon: Orson Ape, MD;  Location: [facility];  Service: Urology;  Laterality: N/A;   RETINAL DETACHMENT SURGERY Left    TRANSURETHRAL RESECTION OF PROSTATE     UPPER EXTREMITY VENOGRAPHY Right 11/20/2022   Procedure: UPPER EXTREMITY VENOGRAPHY;  Surgeon: Annice Needy, MD;  Location: [facility];  Service: Cardiovascular;  Laterality: Right;   UPPER EXTREMITY VENOGRAPHY Right 11/24/2022   Procedure: UPPER EXTREMITY VENOGRAPHY;  Surgeon: Annice Needy, MD;  Location: [facility];  Service: Cardiovascular;  Laterality: Right;   XI ROBOTIC ASSISTED VENTRAL HERNIA N/A 11/23/2021   Procedure: XI ROBOTIC ASSISTED VENTRAL HERNIA, incisional;  Surgeon: Henrene Dodge, MD;  Location: [facility]  [facility];  Service: General;  Laterality: N/A;    Family History  Problem Relation Age of Onset   Leukemia Father    Leukemia Brother     Allergies  Allergen Reactions   Fish Allergy Swelling   Morphine Other (See Comments)    About 5-10 minutes after admin of 2 mg morphine IV, patient became very agitated and tearful, loudly expressing desire to "be taken by Jesus", unable to reason with or calm patient   Ciprofloxacin  Unknown reaction   Doxazosin Other (See Comments)    unknown       Latest Ref Rng & Units 11/29/2022    2:30 PM 11/25/2022    5:13 AM 11/24/2022    4:52 AM  CBC  WBC 4.0 - 10.5 K/uL 11.4  9.1  7.9   Hemoglobin 13.0 - 17.0 g/dL 8.9  9.7  9.7   Hematocrit 39.0 - 52.0 % 29.3  30.8  30.4   Platelets 150 - 400 K/uL 110  129  130       CMP     Component Value Date/Time   NA 139 11/29/2022 1601   NA 141 10/14/2012 0518   K 3.6 11/29/2022 1601   K 4.0 10/14/2012 0518   CL 101 11/29/2022 1601   CL 111 (H) 10/14/2012 0518   CO2 25 11/29/2022 1601   CO2 20 (L) 10/14/2012 0518   GLUCOSE 114 (H) 11/29/2022 1601   GLUCOSE 171 (H) 10/14/2012 0518   BUN 28 (H) 11/29/2022 1601   BUN 44 (H) 10/14/2012 0518   CREATININE 5.90 (H) 11/29/2022 1601   CREATININE 2.54 (H) 10/14/2012 0518   CALCIUM 9.5 11/29/2022 1601   CALCIUM 9.2 10/14/2012 0518   PROT 7.1 11/29/2022 1601   PROT 7.3 10/14/2012 0518   ALBUMIN 3.3 (L) 11/29/2022 1601   ALBUMIN 2.4 (L) 10/14/2012 0518   AST 20 11/29/2022 1601   AST 15 10/14/2012 0518   ALT 7 11/29/2022 1601   ALT 15 10/14/2012 0518   ALKPHOS 66 11/29/2022 1601   ALKPHOS 84 10/14/2012 0518   BILITOT 0.9 11/29/2022 1601   BILITOT 0.3 10/14/2012 0518   GFRNONAA 9 (L) 11/29/2022 1601   GFRNONAA 25 (L) 10/14/2012 0518   GFRAA 10 (L) 06/17/2018 1132   GFRAA 29 (L) 10/14/2012 0518     No results found.     Assessment & Plan:   1. S/P arteriovenous (AV) fistula creation The patient still has a small open area at the distal portion  of his fistula.  There is still some drainage.  We will have the patient placed Aquacel changed every other day and have her return in 2 to 3 weeks for evaluation of the wound.   Current Outpatient Medications on File Prior to Visit  Medication Sig Dispense Refill   acetaminophen (TYLENOL) 500 MG tablet Take 2 tablets (1,000 mg total) by mouth every 6 (six) hours as needed for mild pain. (Patient taking differently: Take 1,000 mg by mouth 2 (two) times daily.)     amLODipine (NORVASC) 10 MG tablet Take 10 mg by mouth every morning.     aspirin EC 81 MG tablet Take 81 mg by mouth daily. Swallow whole.     azelastine (ASTELIN) 0.1 % nasal spray Place 1 spray into both nostrils 2 (two) times daily as needed for rhinitis. Use in each nostril as directed     bacitracin 500 UNIT/GM ointment Apply 1 Application topically 2 (two) times daily. To right AV fistula wound 15 g 0   bicalutamide (CASODEX) 50 MG tablet Take 50 mg by mouth every morning.     cinacalcet (SENSIPAR) 30 MG tablet Take 30 mg by mouth daily.     enalapril (VASOTEC) 20 MG tablet Take 20 mg by mouth 2 (two) times daily.      fluticasone (FLONASE) 50 MCG/ACT nasal spray Place 1 spray into both nostrils daily as needed for allergies or rhinitis.     HYDROcodone-acetaminophen (NORCO/VICODIN) 5-325 MG tablet Take 2  tablets by mouth every 6 (six) hours as needed for moderate pain. 20 tablet 0   lovastatin (MEVACOR) 20 MG tablet Take 20 mg by mouth at bedtime.     metoprolol (LOPRESSOR) 50 MG tablet Take 25 mg by mouth 2 (two) times daily.     polyethylene glycol (MIRALAX / GLYCOLAX) 17 g packet Take 17 g by mouth daily as needed.     sodium bicarbonate 650 MG tablet Take 1,300 mg by mouth 2 (two) times daily.     ULORIC 40 MG tablet Take 40 mg by mouth every morning.     vancomycin (VANCOREADY) 750 MG/150ML SOLN Inject 150 mLs (750 mg total) into the vein every Monday, Wednesday, and Friday with hemodialysis.     No current  facility-administered medications on file prior to visit.    There are no Patient Instructions on file for this visit. No follow-ups on file.   Georgiana Spinner, NP

## 2022-12-26 ENCOUNTER — Ambulatory Visit
Admission: RE | Admit: 2022-12-26 | Discharge: 2022-12-26 | Disposition: A | Discharge status: Home / Self Care | Payer: Medicare Other | Source: Ambulatory Visit | Attending: Family Medicine | Admitting: Family Medicine

## 2022-12-26 DIAGNOSIS — N644 Mastodynia: Secondary | ICD-10-CM

## 2022-12-26 DIAGNOSIS — Z8679 Personal history of other diseases of the circulatory system: Secondary | ICD-10-CM | POA: Diagnosis present

## 2022-12-30 IMAGING — MR MR ABDOMEN W/O CM
9 series · 48 of 48 positions shown · non-contrast
Comparison: Noncontrast MRI 05/24/2020, CT 10/07/2019 (also
noncontrast

CLINICAL DATA: Follow-up indeterminate renal lesion.

EXAM:
MRI ABDOMEN WITHOUT CONTRAST
TECHNIQUE: Multiplanar multisequence MR imaging was performed without the
administration of intravenous contrast.

[Series 5: T2 · coronal · 6.0mm · 1.25mm/px · 4 of 35 slices shown (1 of 2)]
[im 1/35]
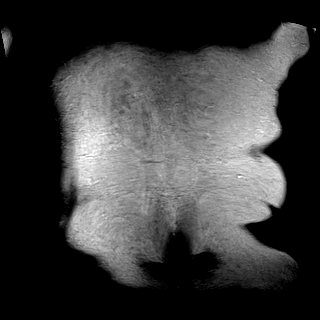
[im 12/35]
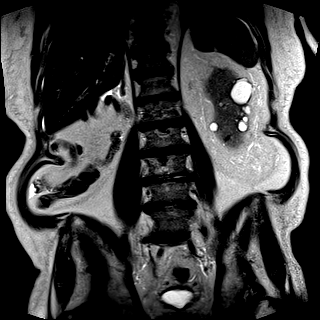
[im 23/35]
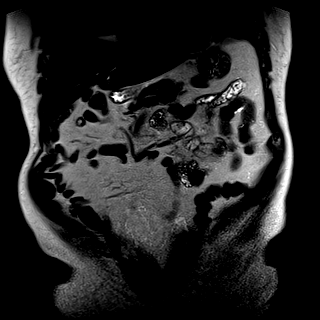
[im 35/35]
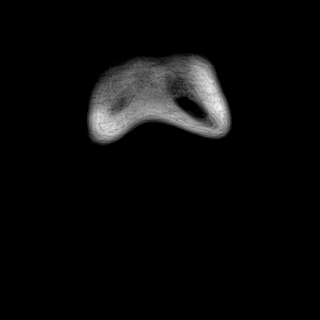

[Series 6: T2 · axial · 6.0mm · 1.19mm/px · z∈[-290,+26]mm · 4 of 45 slices shown (2 of 2)]
[im 1/45]
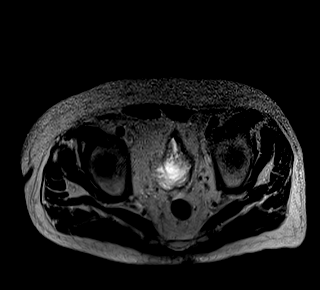
[im 15/45]
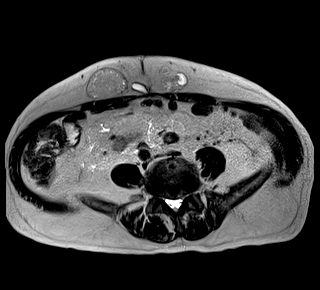
[im 30/45]
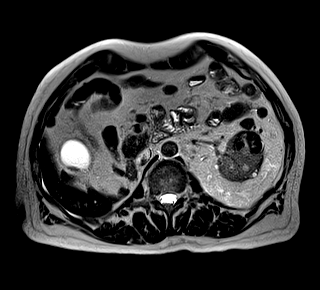
[im 45/45]
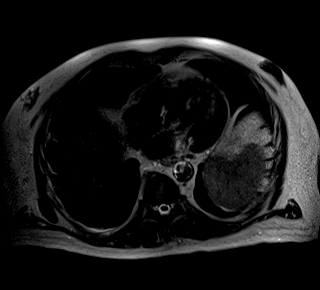

[Series 8: ax dwi_tracew · axial · 6.0mm · 1.42mm/px · z∈[-287,+22]mm · 10 of 132 slices shown]
[im 1/132]
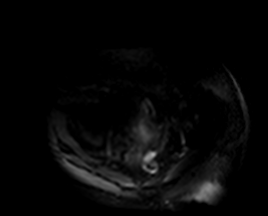
[im 15/132]
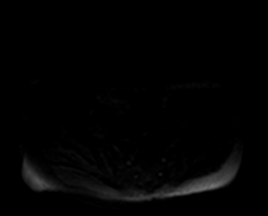
[im 30/132]
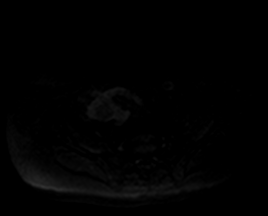
[im 44/132]
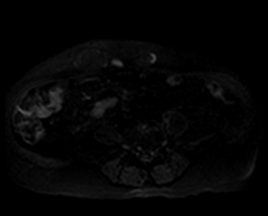
[im 59/132]
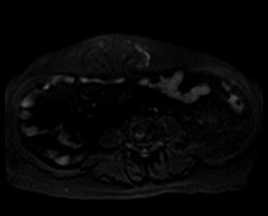
[im 73/132]
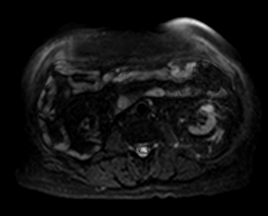
[im 88/132]
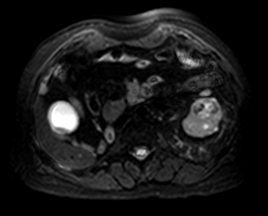
[im 102/132]
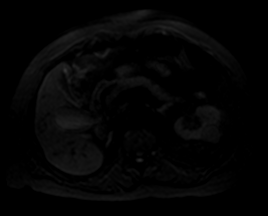
[im 117/132]
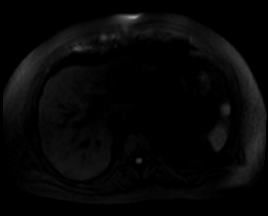
[im 132/132]
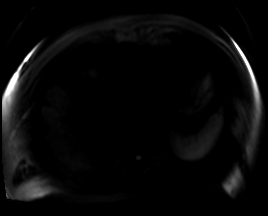

[Series 9: ax dwi_adc · axial · 6.0mm · 1.42mm/px · z∈[-287,+22]mm · 3 of 44 slices shown]
[im 1/44]
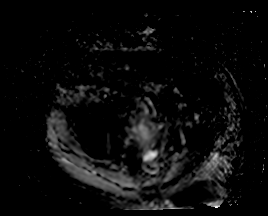
[im 22/44]
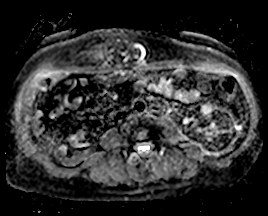
[im 44/44]
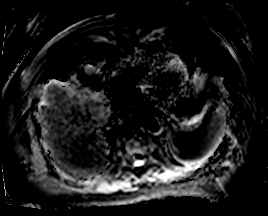

[Series 10: T2 fat-sat · axial · 6.0mm · 1.19mm/px · z∈[-276,+12]mm · 3 of 41 slices shown]
[im 1/41]
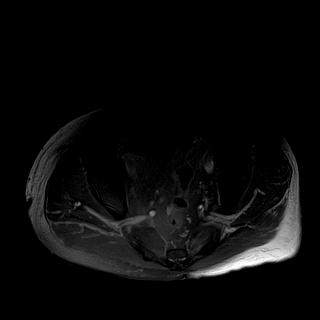
[im 21/41]
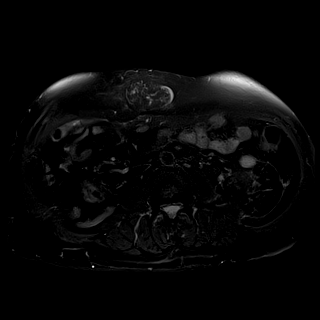
[im 41/41]
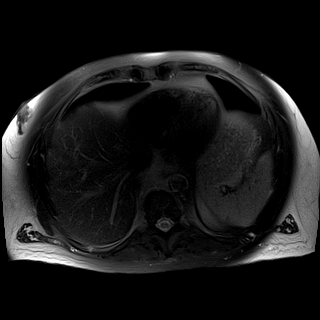

[Series 11: in & out · axial · 3.0mm · 1.19mm/px · z∈[-274,+10]mm · 7 of 96 slices shown (1 of 2)]
[im 1/96]
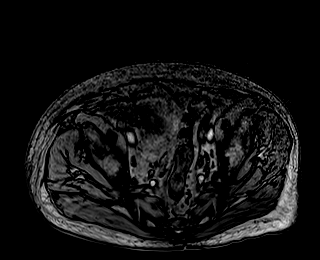
[im 16/96]
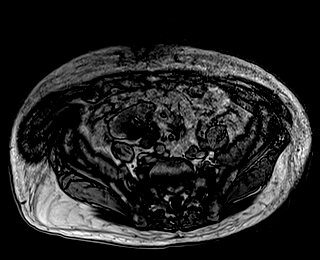
[im 32/96]
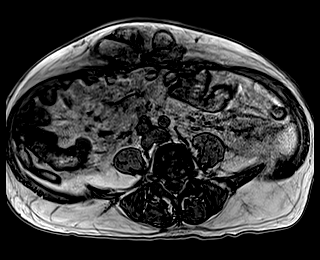
[im 48/96]
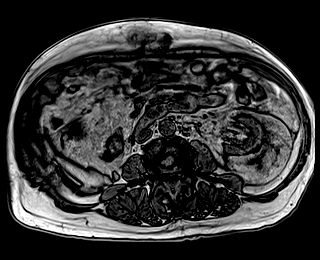
[im 64/96]
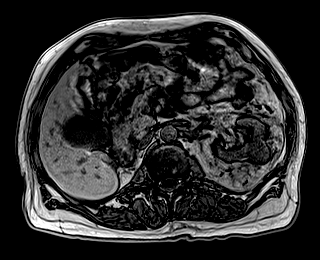
[im 80/96]
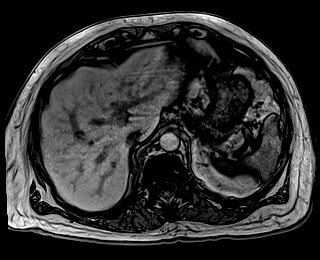
[im 96/96]
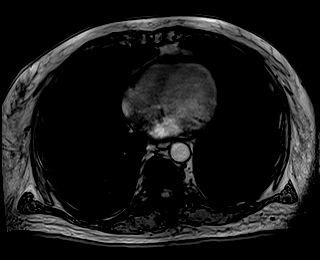

[Series 12: in & out · axial · 3.0mm · 1.19mm/px · z∈[-274,+10]mm · 7 of 96 slices shown (2 of 2)]
[im 1/96]
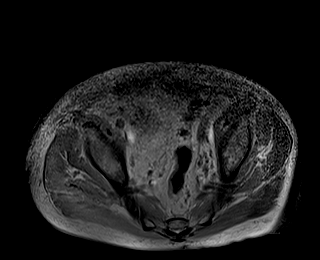
[im 16/96]
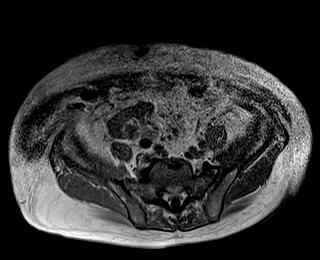
[im 32/96]
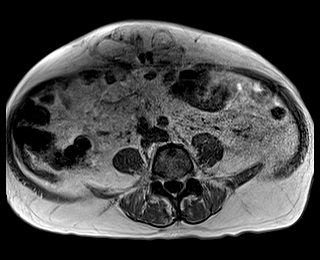
[im 48/96]
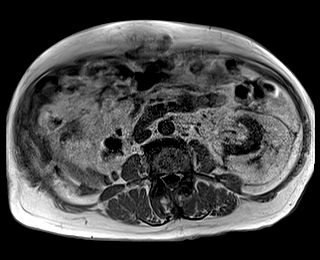
[im 64/96]
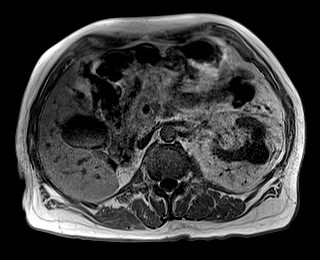
[im 80/96]
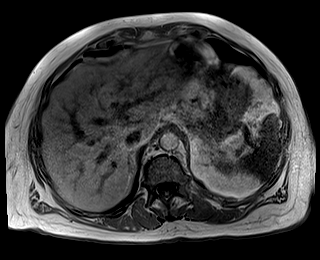
[im 96/96]
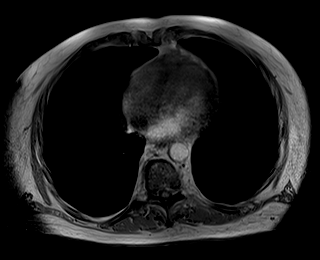

[Series 13: bSSFP · axial · 6.0mm · 0.74mm/px · z∈[-279,+15]mm · 3 of 42 slices shown]
[im 1/42]
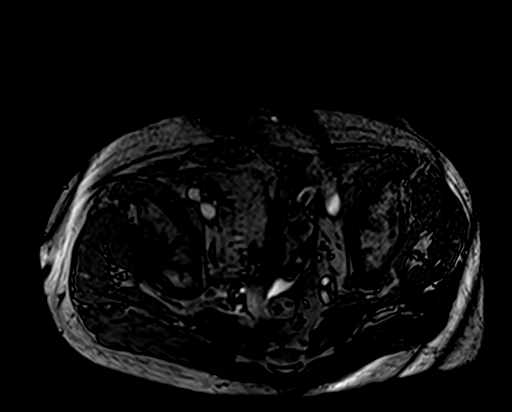
[im 21/42]
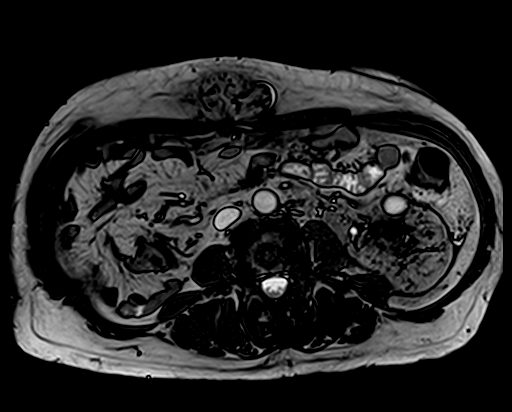
[im 42/42]
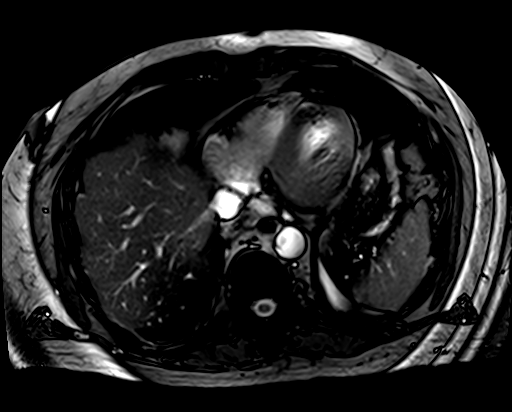

[Series 14: T1 dynamic fat-sat · axial · non-contrast · 3.0mm · 1.19mm/px · z∈[-274,+10]mm · 7 of 96 slices shown]
[im 1/96]
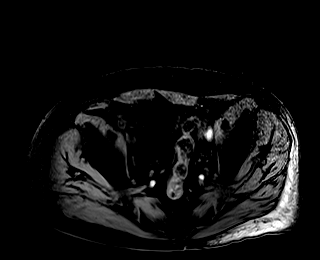
[im 16/96]
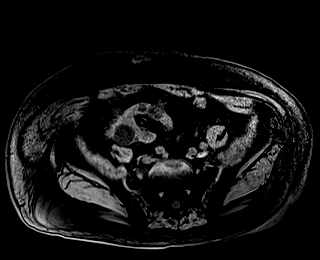
[im 32/96]
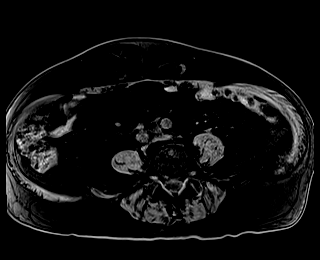
[im 48/96]
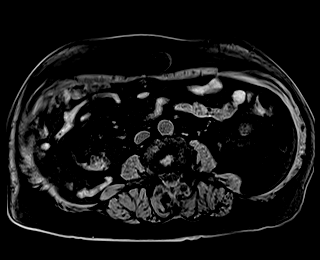
[im 64/96]
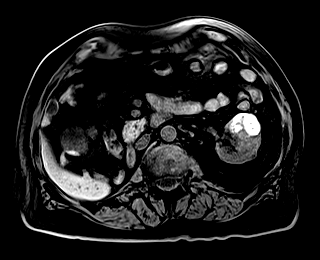
[im 80/96]
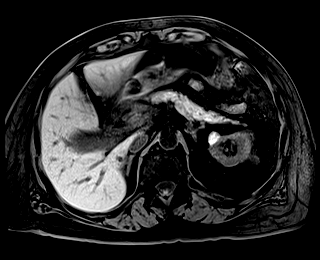
[im 96/96]
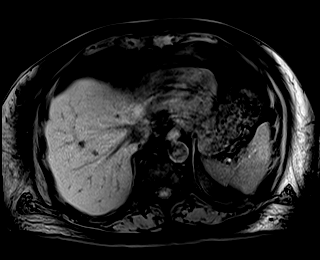

[48 of 48 positions shown; findings below may reference images not displayed]

FINDINGS: Lower chest:  Lung bases are clear.

Hepatobiliary: Multiple small stones noted dependently within the
fundus of the gallbladder. Gallbladder is mildly distended to 4.1 cm
similar prior. No intrahepatic duct dilatation. Common bile duct
normal caliber without choledocholithiasis.

Pancreas: Normal pancreatic parenchymal intensity. No ductal
dilatation or inflammation.

Spleen: Normal spleen.

Adrenals/urinary tract: Adrenal glands normal.

In the anterior margin of the LEFT kidney the there is a
well-circumscribed bilobed lesion which is high signal intensity on
T1 weighted imaging measuring 3.4 x 2.7 cm compared with 3.5 x
cm for no interval change. Lesion again has intensely high signal
intensity on T1 weighted images some heterogeneity of signal. There
is low signal intensity on T2 weighted imaging which is also
heterogeneous.

Additional within the LEFT kidney cortex which have high signal
intensity on T2 weighted imaging a typical of simple cysts.

Again without IV contrast further characterization of the dominant
lesion or small lesions cannot be made.

RIGHT pelvic kidney with multiple benign appearing cystic lesions.

Stomach/Bowel: Stomach and limited of the small bowel is
unremarkable

Vascular/Lymphatic: Abdominal aortic normal caliber. No
retroperitoneal periportal lymphadenopathy.

Musculoskeletal: No aggressive osseous lesion. There is a ventral
midline hernia which contains herniated fat. Hernia sac measures
cm in hernia mouth measures 12 mm (27/series 6)
IMPRESSION: 1. No interval change in complex proteinaceous/hemorrhagic LEFT
renal mass.
2. Additional simple appearing renal cysts in LEFT kidney.
3. Pelvic kidney on the RIGHT has similar benign-appearing cysts.
4. Small ventral hernia.
5. Cholelithiasis without cholecystitis.

## 2023-01-02 ENCOUNTER — Ambulatory Visit (INDEPENDENT_AMBULATORY_CARE_PROVIDER_SITE_OTHER): Payer: Medicare Other | Admitting: Vascular Surgery

## 2023-01-02 VITALS — BP 149/70 | HR 67 | Resp 16 | Ht 69.0 in | Wt 175.2 lb

## 2023-01-02 DIAGNOSIS — L03113 Cellulitis of right upper limb: Secondary | ICD-10-CM

## 2023-01-02 DIAGNOSIS — N186 End stage renal disease: Secondary | ICD-10-CM

## 2023-01-02 DIAGNOSIS — Z992 Dependence on renal dialysis: Secondary | ICD-10-CM

## 2023-01-02 DIAGNOSIS — I10 Essential (primary) hypertension: Secondary | ICD-10-CM

## 2023-01-02 NOTE — Assessment & Plan Note (Signed)
blood pressure control important in reducing the progression of atherosclerotic disease. On appropriate oral medications.  

## 2023-01-02 NOTE — Progress Notes (Signed)
Patient ID: [name] [name], male   DOB: 1941/07/25, 82 y.o.   MRN: [id number]  Chief Complaint  Patient presents with   Follow-up    HPI [name] [name] is a 82 y.o. male.  Patient returns in follow-up of his dialysis access and right arm swelling.  He had a right brachiocephalic AV fistula created and then developed marked right arm swelling associated with the center venous stenosis.  The swelling has markedly improved although not entirely resolved with the removal of the right jugular PermCath and treatment of the central venous stenosis with angioplasty.  His PermCath is currently in the femoral region and working well.  His fistula is now about 2 months old.  It has an excellent thrill and is visibly enlarged at this point.   Past Medical History:  Diagnosis Date   Anemia    Arthritis    Cancer (HCC)    Chronic kidney disease    Diabetes mellitus without complication (HCC)    ESRD (end stage renal disease) (HCC)    GERD (gastroesophageal reflux disease)    h/o   Gout    Hyperlipidemia, mixed    Hypertension    Hypokalemia    Renal mass    Secondary hyperparathyroidism of renal origin (HCC)    Sleep apnea    h/o no cpap   Thrombocytopenia (HCC)     Past Surgical History:  Procedure Laterality Date   APPENDECTOMY     AV FISTULA PLACEMENT Right 10/26/2022   Procedure: ARTERIOVENOUS (AV) FISTULA CREATION;  Surgeon: Annice Needy, MD;  Location: [facility];  Service: Vascular;  Laterality: Right;   Bunion Removal Right    CAPD INSERTION N/A 11/23/2021   Procedure: LAPAROSCOPIC INSERTION CONTINUOUS AMBULATORY PERITONEAL DIALYSIS  (CAPD) CATHETER;  Surgeon: Henrene Dodge, MD;  Location: [facility];  Service: General;  Laterality: N/A;   CAPD REVISION N/A 01/31/2022   Procedure: LAPAROSCOPIC REVISION CONTINUOUS AMBULATORY PERITONEAL DIALYSIS  (CAPD) CATHETER;  Surgeon: Henrene Dodge, MD;  Location: [facility];  Service: General;  Laterality: N/A;   CAPD REVISION N/A 04/06/2022    Procedure: LAPAROSCOPIC REVISION CONTINUOUS AMBULATORY PERITONEAL DIALYSIS  (CAPD) CATHETER, replacement;  Surgeon: Henrene Dodge, MD;  Location: [facility];  Service: General;  Laterality: N/A;   CAPD REVISION N/A 04/14/2022   Procedure: LAPAROSCOPIC ASSISTED CONTINUOUS AMBULATORY PERITONEAL DIALYSIS  (CAPD) CATHETER REMOVAL;  Surgeon: Henrene Dodge, MD;  Location: [facility];  Service: General;  Laterality: N/A;   COLONOSCOPY WITH PROPOFOL N/A 06/14/2015   Procedure: COLONOSCOPY WITH PROPOFOL;  Surgeon: Christena Deem, MD;  Location: [facility] ENDOSCOPY;  Service: Endoscopy;  Laterality: N/A;   DIALYSIS/PERMA CATHETER INSERTION N/A 10/03/2021   Procedure: DIALYSIS/PERMA CATHETER INSERTION;  Surgeon: Annice Needy, MD;  Location: [facility];  Service: Cardiovascular;  Laterality: N/A;   EYE SURGERY     HERNIA REPAIR     abd   INSERTION OF MESH  11/23/2021   Procedure: INSERTION OF MESH;  Surgeon: Henrene Dodge, MD;  Location: [facility];  Service: General;;   PROSTATE BIOPSY N/A 03/27/2017   Procedure: PROSTATE BIOPSY-URO NAV;  Surgeon: Orson Ape, MD;  Location: [facility];  Service: Urology;  Laterality: N/A;   PROSTATE BIOPSY N/A 09/11/2019   Procedure: PROSTATE BIOPSY Michell Heinrich;  Surgeon: Orson Ape, MD;  Location: [facility];  Service: Urology;  Laterality: N/A;   RETINAL DETACHMENT SURGERY Left    TRANSURETHRAL RESECTION OF PROSTATE     UPPER EXTREMITY VENOGRAPHY Right 11/20/2022  Procedure: UPPER EXTREMITY VENOGRAPHY;  Surgeon: Annice Needy, MD;  Location: [facility];  Service: Cardiovascular;  Laterality: Right;   UPPER EXTREMITY VENOGRAPHY Right 11/24/2022   Procedure: UPPER EXTREMITY VENOGRAPHY;  Surgeon: Annice Needy, MD;  Location: [facility];  Service: Cardiovascular;  Laterality: Right;   XI ROBOTIC ASSISTED VENTRAL HERNIA N/A 11/23/2021   Procedure: XI ROBOTIC ASSISTED VENTRAL HERNIA, incisional;  Surgeon: Henrene Dodge, MD;  Location: [facility];   Service: General;  Laterality: N/A;      Allergies  Allergen Reactions   Fish Allergy Swelling   Morphine Other (See Comments)    About 5-10 minutes after admin of 2 mg morphine IV, patient became very agitated and tearful, loudly expressing desire to "be taken by Jesus", unable to reason with or calm patient   Ciprofloxacin     Unknown reaction   Doxazosin Other (See Comments)    unknown    Current Outpatient Medications  Medication Sig Dispense Refill   acetaminophen (TYLENOL) 500 MG tablet Take 2 tablets (1,000 mg total) by mouth every 6 (six) hours as needed for mild pain. (Patient taking differently: Take 1,000 mg by mouth 2 (two) times daily.)     aspirin EC 81 MG tablet Take 81 mg by mouth daily. Swallow whole.     azelastine (ASTELIN) 0.1 % nasal spray Place 1 spray into both nostrils 2 (two) times daily as needed for rhinitis. Use in each nostril as directed     bacitracin 500 UNIT/GM ointment Apply 1 Application topically 2 (two) times daily. To right AV fistula wound 15 g 0   bicalutamide (CASODEX) 50 MG tablet Take 50 mg by mouth every morning.     cinacalcet (SENSIPAR) 30 MG tablet Take 30 mg by mouth daily.     enalapril (VASOTEC) 20 MG tablet Take 20 mg by mouth 2 (two) times daily.      fluticasone (FLONASE) 50 MCG/ACT nasal spray Place 1 spray into both nostrils daily as needed for allergies or rhinitis.     HYDROcodone-acetaminophen (NORCO/VICODIN) 5-325 MG tablet Take 2 tablets by mouth every 6 (six) hours as needed for moderate pain. 20 tablet 0   lovastatin (MEVACOR) 20 MG tablet Take 20 mg by mouth at bedtime.     polyethylene glycol (MIRALAX / GLYCOLAX) 17 g packet Take 17 g by mouth daily as needed.     sodium bicarbonate 650 MG tablet Take 1,300 mg by mouth 2 (two) times daily.     ULORIC 40 MG tablet Take 40 mg by mouth every morning.     vancomycin (VANCOREADY) 750 MG/150ML SOLN Inject 150 mLs (750 mg total) into the vein every Monday, Wednesday, and Friday  with hemodialysis.     amLODipine (NORVASC) 10 MG tablet Take 10 mg by mouth every morning. (Patient not taking: Reported on 01/02/2023)     metoprolol (LOPRESSOR) 50 MG tablet Take 25 mg by mouth 2 (two) times daily. (Patient not taking: Reported on 01/02/2023)     No current facility-administered medications for this visit.        Physical Exam BP (!) 149/70 (BP Location: Left Arm)   Pulse 67   Resp 16   Ht 5\' 9"  (1.753 m)   Wt 175 lb 3.2 oz (79.5 kg)   BMI 25.87 kg/m  Gen:  WD/WN, NAD Skin: incision C/D/I.  Excellent thrill in the right brachiocephalic AV fistula.  1+ right upper extremity edema     Assessment/Plan:  ESRD  on dialysis [facility]) This fistula is visibly mature and his arm swelling has reduced to the point that this could be used for dialysis at this point.  I have given them the okay to start using his AV fistula on Monday, May 20.  Once we know this fistula works well for dialysis for several treatments, we can remove his femoral PermCath.  I will plan on HDA to follow this AV fistula in about 3 months.  Cellulitis of right arm Arm swelling much improved after intervention for his central venous stenosis.  Benign essential hypertension blood pressure control important in reducing the progression of atherosclerotic disease. On appropriate oral medications.      [name] 01/02/2023, 2:04 PM   This note was created with Dragon medical transcription system.  Any errors from dictation are unintentional.

## 2023-01-02 NOTE — Assessment & Plan Note (Signed)
This fistula is visibly mature and his arm swelling has reduced to the point that this could be used for dialysis at this point.  I have given them the okay to start using his AV fistula on Monday, May 20.  Once we know this fistula works well for dialysis for several treatments, we can remove his femoral PermCath.  I will plan on HDA to follow this AV fistula in about 3 months.

## 2023-01-02 NOTE — Assessment & Plan Note (Signed)
Arm swelling much improved after intervention for his central venous stenosis.

## 2023-02-02 ENCOUNTER — Telehealth (INDEPENDENT_AMBULATORY_CARE_PROVIDER_SITE_OTHER): Payer: Self-pay

## 2023-02-02 NOTE — Telephone Encounter (Signed)
A fax was received from [facility] for the patient to have a permcath removal. Patient has been scheduled with Dr. Wyn Quaker on 02/12/23 with a 2:00 pm arrival time to the [facility]. Pre-procedure instructions were faxed back to attention [place] at [facility].

## 2023-02-12 ENCOUNTER — Ambulatory Visit
Admission: RE | Admit: 2023-02-12 | Discharge: 2023-02-12 | Disposition: A | Discharge status: Home / Self Care | Payer: Medicare Other | Attending: Vascular Surgery | Admitting: Vascular Surgery

## 2023-02-12 ENCOUNTER — Encounter
Admission: RE | Disposition: A | Discharge status: Home / Self Care | Payer: Self-pay | Source: Home / Self Care | Attending: Vascular Surgery

## 2023-02-12 DIAGNOSIS — Z452 Encounter for adjustment and management of vascular access device: Secondary | ICD-10-CM | POA: Diagnosis not present

## 2023-02-12 DIAGNOSIS — Z992 Dependence on renal dialysis: Secondary | ICD-10-CM | POA: Diagnosis not present

## 2023-02-12 DIAGNOSIS — E1122 Type 2 diabetes mellitus with diabetic chronic kidney disease: Secondary | ICD-10-CM | POA: Insufficient documentation

## 2023-02-12 DIAGNOSIS — Z95828 Presence of other vascular implants and grafts: Secondary | ICD-10-CM

## 2023-02-12 DIAGNOSIS — Y841 Kidney dialysis as the cause of abnormal reaction of the patient, or of later complication, without mention of misadventure at the time of the procedure: Secondary | ICD-10-CM | POA: Diagnosis not present

## 2023-02-12 DIAGNOSIS — I120 Hypertensive chronic kidney disease with stage 5 chronic kidney disease or end stage renal disease: Secondary | ICD-10-CM | POA: Insufficient documentation

## 2023-02-12 DIAGNOSIS — T8249XA Other complication of vascular dialysis catheter, initial encounter: Secondary | ICD-10-CM | POA: Insufficient documentation

## 2023-02-12 DIAGNOSIS — N186 End stage renal disease: Secondary | ICD-10-CM | POA: Insufficient documentation

## 2023-02-12 HISTORY — PX: DIALYSIS/PERMA CATHETER REMOVAL: CATH118289

## 2023-02-12 SURGERY — DIALYSIS/PERMA CATHETER REMOVAL
Anesthesia: LOCAL

## 2023-02-12 MED ORDER — LIDOCAINE-EPINEPHRINE (PF) 1 %-1:200000 IJ SOLN
INTRAMUSCULAR | Status: DC | PRN
  Administered 2023-02-12: 20 mL

## 2023-02-12 SURGICAL SUPPLY — 2 items
FORCEPS HALSTEAD CVD 5IN STRL (INSTRUMENTS) IMPLANT
TRAY LACERAT/PLASTIC (MISCELLANEOUS) IMPLANT

## 2023-02-12 NOTE — Op Note (Signed)
Operative Note     Preoperative diagnosis:   1. ESRD with functional permanent access  Postoperative diagnosis:  1. ESRD with functional permanent access  Procedure:  Removal of right femoral Permcath  Surgeon:  [name], MD  Anesthesia:  Local  EBL:  Minimal  Indication for the Procedure:  The patient has a functional permanent dialysis access and no longer needs their permcath.  This can be removed.  Risks and benefits are discussed and informed consent is obtained.  Description of the Procedure:  The patient's right thigh, groin, and existing catheter were sterilely prepped and draped. The area around the catheter was anesthetized copiously with 1% lidocaine. The catheter was dissected out with curved hemostats until the cuff was freed from the surrounding fibrous sheath. The fiber sheath was transected, and the catheter was then removed in its entirety using gentle traction. Pressure was held and sterile dressings were placed. The patient tolerated the procedure well and was taken to the recovery room in stable condition.     [name]  02/12/2023, 3:25 PM This note was created with Dragon Medical transcription system. Any errors in dictation are purely unintentional.

## 2023-02-12 NOTE — H&P (Signed)
[facility] Admission History & Physical  MRN : [id number]  [name] [name] is a 82 y.o. (12-25-1940) male who presents with chief complaint of No chief complaint on file. Marland Kitchen  History of Present Illness: I am asked to evaluate the patient by the dialysis center. The patient was sent here because they have a nonfunctioning tunneled catheter and a functioning AVF.  The patient reports they're not been any problems with any of their dialysis runs. They are reporting good flows with good parameters at dialysis.   Patient denies pain or tenderness overlying the access.  There is no pain with dialysis.  The patient denies hand pain or finger pain consistent with steal syndrome.  No fevers or chills while on dialysis.    No current facility-administered medications for this encounter.    Past Medical History:  Diagnosis Date   Anemia    Arthritis    Cancer (HCC)    Chronic kidney disease    Diabetes mellitus without complication (HCC)    ESRD (end stage renal disease) (HCC)    GERD (gastroesophageal reflux disease)    h/o   Gout    Hyperlipidemia, mixed    Hypertension    Hypokalemia    Renal mass    Secondary hyperparathyroidism of renal origin (HCC)    Sleep apnea    h/o no cpap   Thrombocytopenia (HCC)     Past Surgical History:  Procedure Laterality Date   APPENDECTOMY     AV FISTULA PLACEMENT Right 10/26/2022   Procedure: ARTERIOVENOUS (AV) FISTULA CREATION;  Surgeon: Annice Needy, MD;  Location: [facility] ORS;  Service: Vascular;  Laterality: Right;   Bunion Removal Right    CAPD INSERTION N/A 11/23/2021   Procedure: LAPAROSCOPIC INSERTION CONTINUOUS AMBULATORY PERITONEAL DIALYSIS  (CAPD) CATHETER;  Surgeon: Henrene Dodge, MD;  Location: [facility];  Service: General;  Laterality: N/A;   CAPD REVISION N/A 01/31/2022   Procedure: LAPAROSCOPIC REVISION CONTINUOUS AMBULATORY PERITONEAL DIALYSIS  (CAPD) CATHETER;  Surgeon: Henrene Dodge, MD;  Location: [facility] OR[facility];   Service: General;  Laterality: N/A;   CAPD REVISION N/A 04/06/2022   Procedure: LAPAROSCOPIC REVISION CONTINUOUS AMBULATORY PERITONEAL DIALYSIS  (CAPD) CATHETER, replacement;  Surgeon: Henrene Dodge, MD;  Location: [facility]S;  Service: General;  Laterality: N/A;   CAPD REVISION N/A 04/14/2022   Procedure: LAPAROSCOPIC ASSISTED CONTINUOUS AMBULATORY PERITONEAL DIALYSIS  (CAPD) CATHETER REMOVAL;  Surgeon: Henrene Dodge, MD;  Location: [facility];  Service: General;  Laterality: N/A;   COLONOSCOPY WITH PROPOFOL N/A 06/14/2015   Procedure: COLONOSCOPY WITH PROPOFOL;  Surgeon: Christena Deem, MD;  Location: [facility] ENDOSCOPY;  Service: Endoscopy;  Laterality: N/A;   DIALYSIS/PERMA CATHETER INSERTION N/A 10/03/2021   Procedure: DIALYSIS/PERMA CATHETER INSERTION;  Surgeon: Annice Needy, MD;  Location: [facility] INVASIVE CV LAB;  Service: Cardiovascular;  Laterality: N/A;   EYE SURGERY     HERNIA REPAIR     abd   INSERTION OF MESH  11/23/2021   Procedure: INSERTION OF MESH;  Surgeon: Henrene Dodge, MD;  Location: [facility];  Service: General;;   PROSTATE BIOPSY N/A 03/27/2017   Procedure: PROSTATE BIOPSY-URO NAV;  Surgeon: Orson Ape, MD;  Location: [facility];  Service: Urology;  Laterality: N/A;   PROSTATE BIOPSY N/A 09/11/2019   Procedure: PROSTATE BIOPSY Michell Heinrich;  Surgeon: Orson Ape, MD;  Location: [facility];  Service: Urology;  Laterality: N/A;   RETINAL DETACHMENT SURGERY Left    TRANSURETHRAL RESECTION OF PROSTATE  UPPER EXTREMITY VENOGRAPHY Right 11/20/2022   Procedure: UPPER EXTREMITY VENOGRAPHY;  Surgeon: Annice Needy, MD;  Location: [facility] INVASIVE CV LAB;  Service: Cardiovascular;  Laterality: Right;   UPPER EXTREMITY VENOGRAPHY Right 11/24/2022   Procedure: UPPER EXTREMITY VENOGRAPHY;  Surgeon: Annice Needy, MD;  Location: [facility] INVASIVE CV LAB;  Service: Cardiovascular;  Laterality: Right;   XI ROBOTIC ASSISTED VENTRAL HERNIA N/A 11/23/2021   Procedure: XI ROBOTIC ASSISTED VENTRAL HERNIA,  incisional;  Surgeon: Henrene Dodge, MD;  Location: [facility];  Service: General;  Laterality: N/A;    Social History   Tobacco Use   Smoking status: Never   Smokeless tobacco: Never  Vaping Use   Vaping Use: Never used  Substance Use Topics   Alcohol use: Not Currently   Drug use: No    Family History  Problem Relation Age of Onset   Leukemia Father    Leukemia Brother     No family history of bleeding or clotting disorders, autoimmune disease or porphyria  Allergies  Allergen Reactions   Fish Allergy Swelling   Morphine Other (See Comments)    About 5-10 minutes after admin of 2 mg morphine IV, patient became very agitated and tearful, loudly expressing desire to "be taken by Jesus", unable to reason with or calm patient   Ciprofloxacin     Unknown reaction   Doxazosin Other (See Comments)    unknown     REVIEW OF SYSTEMS (Negative unless checked)  Constitutional: [] Weight loss  [] Fever  [] Chills Cardiac: [] Chest pain   [] Chest pressure   [] Palpitations   [] Shortness of breath when laying flat   [] Shortness of breath at rest   [x] Shortness of breath with exertion. Vascular:  [] Pain in legs with walking   [] Pain in legs at rest   [] Pain in legs when laying flat   [] Claudication   [] Pain in feet when walking  [] Pain in feet at rest  [] Pain in feet when laying flat   [] History of DVT   [] Phlebitis   [] Swelling in legs   [] Varicose veins   [] Non-healing ulcers Pulmonary:   [] Uses home oxygen   [] Productive cough   [] Hemoptysis   [] Wheeze  [] COPD   [] Asthma Neurologic:  [] Dizziness  [] Blackouts   [] Seizures   [] History of stroke   [] History of TIA  [] Aphasia   [] Temporary blindness   [] Dysphagia   [] Weakness or numbness in arms   [] Weakness or numbness in legs Musculoskeletal:  [x] Arthritis   [] Joint swelling   [x] Joint pain   [] Low back pain Hematologic:  [] Easy bruising  [] Easy bleeding   [] Hypercoagulable state   [x] Anemic  [] Hepatitis Gastrointestinal:  [] Blood in stool    [] Vomiting blood  [x] Gastroesophageal reflux/heartburn   [] Difficulty swallowing. Genitourinary:  [x] Chronic kidney disease   [] Difficult urination  [] Frequent urination  [] Burning with urination   [] Blood in urine Skin:  [] Rashes   [] Ulcers   [] Wounds Psychological:  [] History of anxiety   []  History of major depression.  Physical Examination  There were no vitals filed for this visit. There is no height or weight on file to calculate BMI. Gen: WD/WN, NAD Head: [place]/AT, No temporalis wasting.  Ear/Nose/Throat: Hearing grossly intact, nares w/o erythema or drainage, oropharynx w/o Erythema/Exudate,  Eyes: Conjunctiva clear, sclera non-icteric Neck: Trachea midline.  No JVD.  Pulmonary:  Good air movement, respirations not labored, no use of accessory muscles.  Cardiac: RRR, normal S1, S2. Vascular:  Vessel Right Left  Radial Palpable Palpable   Musculoskeletal: M/S  5/5 throughout.  Extremities without ischemic changes.  No deformity or atrophy.  Neurologic: Sensation grossly intact in extremities.  Symmetrical.  Speech is fluent. Motor exam as listed above. Psychiatric: Judgment intact, Mood & affect appropriate for pt's clinical situation. Dermatologic: No rashes or ulcers noted.  No cellulitis or open wounds.    CBC Lab Results  Component Value Date   WBC 11.4 (H) 11/29/2022   HGB 8.9 (L) 11/29/2022   HCT 29.3 (L) 11/29/2022   MCV 99.0 11/29/2022   PLT 110 (L) 11/29/2022    BMET    Component Value Date/Time   NA 139 11/29/2022 1601   NA 141 10/14/2012 0518   K 3.6 11/29/2022 1601   K 4.0 10/14/2012 0518   CL 101 11/29/2022 1601   CL 111 (H) 10/14/2012 0518   CO2 25 11/29/2022 1601   CO2 20 (L) 10/14/2012 0518   GLUCOSE 114 (H) 11/29/2022 1601   GLUCOSE 171 (H) 10/14/2012 0518   BUN 28 (H) 11/29/2022 1601   BUN 44 (H) 10/14/2012 0518   CREATININE 5.90 (H) 11/29/2022 1601   CREATININE 2.54 (H) 10/14/2012 0518   CALCIUM 9.5 11/29/2022 1601   CALCIUM 9.2 10/14/2012  0518   GFRNONAA 9 (L) 11/29/2022 1601   GFRNONAA 25 (L) 10/14/2012 0518   GFRAA 10 (L) 06/17/2018 1132   GFRAA 29 (L) 10/14/2012 0518   CrCl cannot be calculated (Patient's most recent lab result is older than the maximum 21 days allowed.).  COAG Lab Results  Component Value Date   INR 1.4 (H) 11/18/2022   INR 1.2 10/24/2022   INR 1.1 01/31/2022    Radiology No results found.  Assessment/Plan 1.  Complication dialysis device:  Patient's Tunneled catheter is not being used. The patient has an extremity access that is functioning well. Therefore, the patient will undergo removal of the tunneled catheter under local anesthesia.  The risks and benefits were described to the patient.  All questions were answered.  The patient agrees to proceed with angiography and intervention. Potassium will be drawn to ensure that it is an appropriate level prior to performing intervention. 2.  End-stage renal disease requiring hemodialysis:  Patient will continue dialysis therapy without further interruption 3.  Hypertension:  Patient will continue medical management; nephrology is following no changes in oral medications. 4. Diabetes mellitus:  Glucose will be monitored and oral medications been held this morning once the patient has undergone the patient's procedure po intake will be reinitiated and again Accu-Cheks will be used to assess the blood glucose level and treat as needed. The patient will be restarted on the patient's usual hypoglycemic regime     [name], MD  02/12/2023 1:37 PM

## 2023-02-12 NOTE — Discharge Instructions (Signed)
Tunneled Catheter Removal, Care After Refer to this sheet in the next few weeks. These instructions provide you with information about caring for yourself after your procedure. Your health care provider may also give you more specific instructions. Your treatment has been planned according to current medical practices, but problems sometimes occur. Call your health care provider if you have any problems or questions after your procedure. What can I expect after the procedure? After the procedure, it is common to have: Some mild redness, swelling, and pain around your catheter site.   Follow these instructions at home: Incision care  Check your removal site  every day for signs of infection. Check for: More redness, swelling, or pain. More fluid or blood. Warmth. Pus or a bad smell. Remove your dressing in 48hrs leave open to air  Activity  Return to your normal activities as told by your health care provider. Ask your health care provider what activities are safe for you. Do not lift anything that is heavier than 10 lb (4.5 kg) for 3 days  You may shower tomorrow  Contact a health care provider if: You have more fluid or blood coming from your removal site You have more redness, swelling, or pain at your incisions or around the area where your catheter was removed Your removal site feel warm to the touch. You feel unusually weak. You feel nauseous.. Get help right away if You have swelling in your arm, shoulder, neck, or face. You develop chest pain. You have difficulty breathing. You feel dizzy or light-headed. You have pus or a bad smell coming from your removal site You have a fever. You develop bleeding from your removal site, and your bleeding does not stop. This information is not intended to replace advice given to you by your health care provider. Make sure you discuss any questions you have with your health care provider. Document Released: 07/24/2012 Document Revised:  04/09/2016 Document Reviewed: 05/03/2015 Elsevier Interactive Patient Education  2017 Elsevier Inc. 

## 2023-02-13 ENCOUNTER — Encounter: Payer: Self-pay | Admitting: Vascular Surgery

## 2023-04-03 ENCOUNTER — Encounter (INDEPENDENT_AMBULATORY_CARE_PROVIDER_SITE_OTHER): Payer: Medicare Other

## 2023-04-03 ENCOUNTER — Ambulatory Visit (INDEPENDENT_AMBULATORY_CARE_PROVIDER_SITE_OTHER): Payer: Medicare Other | Admitting: Vascular Surgery

## 2023-05-29 ENCOUNTER — Ambulatory Visit: Payer: Medicare Other

## 2023-05-29 ENCOUNTER — Encounter: Payer: Self-pay | Admitting: Oncology

## 2023-05-29 DIAGNOSIS — Z23 Encounter for immunization: Secondary | ICD-10-CM | POA: Diagnosis not present

## 2023-05-29 DIAGNOSIS — Z719 Counseling, unspecified: Secondary | ICD-10-CM

## 2023-05-29 NOTE — Progress Notes (Signed)
Homevisit made to pt and his wife.  Pt requests covid vaccine and states he recently received his flu vaccine.  Vaccine administration screening questions completed and signed by pt; sent for scanning.  VIS given.  Comirnaty [id number] formula given by Marin Roberts RN; tolerated well.  After vaccine care reviewed and questions answered.  Updated [facility] record given to pt.  Cherlynn Polo, RN

## 2023-08-02 ENCOUNTER — Ambulatory Visit (INDEPENDENT_AMBULATORY_CARE_PROVIDER_SITE_OTHER): Payer: Medicare Other

## 2023-08-02 ENCOUNTER — Ambulatory Visit (INDEPENDENT_AMBULATORY_CARE_PROVIDER_SITE_OTHER): Payer: Medicare Other | Admitting: Nurse Practitioner

## 2023-08-02 ENCOUNTER — Encounter (INDEPENDENT_AMBULATORY_CARE_PROVIDER_SITE_OTHER): Payer: Self-pay | Admitting: Nurse Practitioner

## 2023-08-02 VITALS — BP 155/68 | HR 62 | Resp 18 | Ht 68.0 in | Wt 172.0 lb

## 2023-08-02 DIAGNOSIS — I10 Essential (primary) hypertension: Secondary | ICD-10-CM | POA: Diagnosis not present

## 2023-08-02 DIAGNOSIS — E1122 Type 2 diabetes mellitus with diabetic chronic kidney disease: Secondary | ICD-10-CM

## 2023-08-02 DIAGNOSIS — Z992 Dependence on renal dialysis: Secondary | ICD-10-CM

## 2023-08-02 DIAGNOSIS — N186 End stage renal disease: Secondary | ICD-10-CM | POA: Diagnosis not present

## 2023-08-02 NOTE — Progress Notes (Signed)
Subjective:    Patient ID: [name] [name], male    DOB: 1941-03-25, 82 y.o.   MRN: [id number] Chief Complaint  Patient presents with   Follow-up    HDA/consult. cold hand, numbness. r/o steal. lateef    [name] [name] is an 82 year old male who presents today for evaluation of possible steal syndrome.  He has been having pain in his right upper extremity.  However he notes that the pain is only occurs and is small finger during dialysis.  He notes that it happens if he holds his arm close to his chest but if he straightens his arm help the cold sensation resolves.  He denies any pain in any other finger.  He denies any coolness to any other portion of his finger other than the pinky itself.  He notes that since he has been stretching his arm he has not had these issues.  He currently has no open wounds currently has extremities.  Today he has a flow volume of 1226.  There is some dilatation noted at the antecubital fossa but no significant evidence of steal syndrome.    Review of Systems  All other systems reviewed and are negative.      Objective:   Physical Exam Vitals reviewed.  Cardiovascular:     Rate and Rhythm: Normal rate.     Pulses:          Radial pulses are 2+ on the right side.  Pulmonary:     Effort: Pulmonary effort is normal.  Skin:    General: Skin is warm and dry.     Capillary Refill: Capillary refill takes less than 2 seconds.  Neurological:     Mental Status: He is alert and oriented to person, place, and time.  Psychiatric:        Mood and Affect: Mood normal.        Behavior: Behavior normal.        Thought Content: Thought content normal.        Judgment: Judgment normal.     BP (!) 155/68   Pulse 62   Resp 18   Ht 5\' 8"  (1.727 m)   Wt 172 lb (78 kg)   BMI 26.15 kg/m   Past Medical History:  Diagnosis Date   Anemia    Arthritis    Cancer (HCC)    Chronic kidney disease    Diabetes mellitus without complication (HCC)    ESRD (end stage  renal disease) (HCC)    GERD (gastroesophageal reflux disease)    h/o   Gout    Hyperlipidemia, mixed    Hypertension    Hypokalemia    Renal mass    Secondary hyperparathyroidism of renal origin (HCC)    Sleep apnea    h/o no cpap   Thrombocytopenia (HCC)     Social History   Socioeconomic History   Marital status: Married    Spouse name: Eber Jones   Number of children: Not on file   Years of education: Not on file   Highest education level: Not on file  Occupational History   Not on file  Tobacco Use   Smoking status: Never   Smokeless tobacco: Never  Vaping Use   Vaping status: Never Used  Substance and Sexual Activity   Alcohol use: Not Currently   Drug use: No   Sexual activity: Not on file  Other Topics Concern   Not on file  Social History Narrative   Not on file  Social Drivers of Corporate investment banker Strain: Low Risk  (07/26/2023)   Received [facility]   Overall Financial Resource Strain (CARDIA)    Difficulty of Paying Living Expenses: Not hard at all  Food Insecurity: No Food Insecurity (07/26/2023)   Received from [facility]   Hunger Vital Sign    Worried About Running Out of Food in the Last Year: Never true    Ran Out of Food in the Last Year: Never true  Transportation Needs: No Transportation Needs (07/26/2023)   Received from [facility] - Transportation    In the past 12 months, has lack of transportation kept you from medical appointments or from getting medications?: No    Lack of Transportation (Non-Medical): No  Physical Activity: Not on file  Stress: Not on file  Social Connections: Not on file  Intimate Partner Violence: Not At Risk (11/19/2022)   Humiliation, Afraid, Rape, and Kick questionnaire    Fear of Current or Ex-Partner: No    Emotionally Abused: No    Physically Abused: No    Sexually Abused: No    Past Surgical History:  Procedure Laterality  Date   APPENDECTOMY     AV FISTULA PLACEMENT Right 10/26/2022   Procedure: ARTERIOVENOUS (AV) FISTULA CREATION;  Surgeon: Annice Needy, MD;  Location: [facility] OR[facility];  Service: Vascular;  Laterality: Right;   Bunion Removal Right    CAPD INSERTION N/A 11/23/2021   Procedure: LAPAROSCOPIC INSERTION CONTINUOUS AMBULATORY PERITONEAL DIALYSIS  (CAPD) CATHETER;  Surgeon: Henrene Dodge, MD;  Location: [facility] OR[facility];  Service: General;  Laterality: N/A;   CAPD REVISION N/A 01/31/2022   Procedure: LAPAROSCOPIC REVISION CONTINUOUS AMBULATORY PERITONEAL DIALYSIS  (CAPD) CATHETER;  Surgeon: Henrene Dodge, MD;  Location: [facility];  Service: General;  Laterality: N/A;   CAPD REVISION N/A 04/06/2022   Procedure: LAPAROSCOPIC REVISION CONTINUOUS AMBULATORY PERITONEAL DIALYSIS  (CAPD) CATHETER, replacement;  Surgeon: Henrene Dodge, MD;  Location: [facility];  Service: General;  Laterality: N/A;   CAPD REVISION N/A 04/14/2022   Procedure: LAPAROSCOPIC ASSISTED CONTINUOUS AMBULATORY PERITONEAL DIALYSIS  (CAPD) CATHETER REMOVAL;  Surgeon: Henrene Dodge, MD;  Location: [facility] OR[facility];  Service: General;  Laterality: N/A;   COLONOSCOPY WITH PROPOFOL N/A 06/14/2015   Procedure: COLONOSCOPY WITH PROPOFOL;  Surgeon: Christena Deem, MD;  Location: [facility] ENDOSCOPY;  Service: Endoscopy;  Laterality: N/A;   DIALYSIS/PERMA CATHETER INSERTION N/A 10/03/2021   Procedure: DIALYSIS/PERMA CATHETER INSERTION;  Surgeon: Annice Needy, MD;  Location: [facility] INVASIVE CV LAB;  Service: Cardiovascular;  Laterality: N/A;   DIALYSIS/PERMA CATHETER REMOVAL N/A 02/12/2023   Procedure: DIALYSIS/PERMA CATHETER REMOVAL;  Surgeon: Annice Needy, MD;  Location: [facility] INVASIVE CV LAB;  Service: Cardiovascular;  Laterality: N/A;   EYE SURGERY     HERNIA REPAIR     abd   INSERTION OF MESH  11/23/2021   Procedure: INSERTION OF MESH;  Surgeon: Henrene Dodge, MD;  Location: [facility];  Service: General;;   PROSTATE BIOPSY N/A 03/27/2017   Procedure: PROSTATE BIOPSY-URO NAV;   Surgeon: Orson Ape, MD;  Location: [facility];  Service: Urology;  Laterality: N/A;   PROSTATE BIOPSY N/A 09/11/2019   Procedure: PROSTATE BIOPSY Michell Heinrich;  Surgeon: Orson Ape, MD;  Location: [facility];  Service: Urology;  Laterality: N/A;   RETINAL DETACHMENT SURGERY Left    TRANSURETHRAL RESECTION OF PROSTATE     UPPER EXTREMITY VENOGRAPHY Right 11/20/2022   Procedure: UPPER EXTREMITY  VENOGRAPHY;  Surgeon: Annice Needy, MD;  Location: [facility] INV[facility] CV LAB;  Service: Cardiovascular;  Laterality: Right;   UPPER EXTREMITY VENOGRAPHY Right 11/24/2022   Procedure: UPPER EXTREMITY VENOGRAPHY;  Surgeon: Annice Needy, MD;  Location: [facility] INV[facility] LAB;  Service: Cardiovascular;  Laterality: Right;   XI ROBOTIC ASSISTED VENTRAL HERNIA N/A 11/23/2021   Procedure: XI ROBOTIC ASSISTED VENTRAL HERNIA, incisional;  Surgeon: Henrene Dodge, MD;  Location: [facility];  Service: General;  Laterality: N/A;    Family History  Problem Relation Age of Onset   Leukemia Father    Leukemia Brother     Allergies  Allergen Reactions   Fish Allergy Swelling   Morphine Other (See Comments)    About 5-10 minutes after admin of 2 mg morphine IV, patient became very agitated and tearful, loudly expressing desire to "be taken by Jesus", unable to reason with or calm patient   Ciprofloxacin     Unknown reaction   Doxazosin Other (See Comments)    unknown       Latest Ref Rng & Units 11/29/2022    2:30 PM 11/25/2022    5:13 AM 11/24/2022    4:52 AM  CBC  WBC 4.0 - 10.5 K/uL 11.4  9.1  7.9   Hemoglobin 13.0 - 17.0 g/dL 8.9  9.7  9.7   Hematocrit 39.0 - 52.0 % 29.3  30.8  30.4   Platelets 150 - 400 K/uL 110  129  130       CMP     Component Value Date/Time   NA 139 11/29/2022 1601   NA 141 10/14/2012 0518   K 3.6 11/29/2022 1601   K 4.0 10/14/2012 0518   CL 101 11/29/2022 1601   CL 111 (H) 10/14/2012 0518   CO2 25 11/29/2022 1601   CO2 20 (L) 10/14/2012 0518   GLUCOSE 114 (H) 11/29/2022 1601    GLUCOSE 171 (H) 10/14/2012 0518   BUN 28 (H) 11/29/2022 1601   BUN 44 (H) 10/14/2012 0518   CREATININE 5.90 (H) 11/29/2022 1601   CREATININE 2.54 (H) 10/14/2012 0518   CALCIUM 9.5 11/29/2022 1601   CALCIUM 9.2 10/14/2012 0518   PROT 7.1 11/29/2022 1601   PROT 7.3 10/14/2012 0518   ALBUMIN 3.3 (L) 11/29/2022 1601   ALBUMIN 2.4 (L) 10/14/2012 0518   AST 20 11/29/2022 1601   AST 15 10/14/2012 0518   ALT 7 11/29/2022 1601   ALT 15 10/14/2012 0518   ALKPHOS 66 11/29/2022 1601   ALKPHOS 84 10/14/2012 0518   BILITOT 0.9 11/29/2022 1601   BILITOT 0.3 10/14/2012 0518   GFRNONAA 9 (L) 11/29/2022 1601   GFRNONAA 25 (L) 10/14/2012 0518     No results found.     Assessment & Plan:   1. ESRD on dialysis [facility]) (Primary) Today based upon the patient's description of symptoms I do not believe that this is truly a steal syndrome as it really only occurs in 1 finger and improves when he changes the position of his hand.  Based on this we will see the patient back in 3 months to see how symptoms have been.  We discussed the options with him including angiogram for exploration of steal symptoms but at this time he wishes to proceed more conservatively.  However symptoms have worsened we will see him back sooner.  2. Benign essential hypertension Continue antihypertensive medications as already ordered, these medications have been reviewed and there are no changes at this time.  3. Type  2 diabetes mellitus with stage 5 chronic kidney disease not on chronic dialysis, unspecified whether long term insulin use (HCC) Continue hypoglycemic medications as already ordered, these medications have been reviewed and there are no changes at this time.  Hgb A1C to be monitored as already arranged by primary service   Current Outpatient Medications on File Prior to Visit  Medication Sig Dispense Refill   acetaminophen (TYLENOL) 500 MG tablet Take 2 tablets (1,000 mg total) by mouth every 6 (six) hours as  needed for mild pain. (Patient taking differently: Take 1,000 mg by mouth 2 (two) times daily.)     aspirin EC 81 MG tablet Take 81 mg by mouth daily. Swallow whole.     azelastine (ASTELIN) 0.1 % nasal spray Place 1 spray into both nostrils 2 (two) times daily as needed for rhinitis. Use in each nostril as directed     bacitracin 500 UNIT/GM ointment Apply 1 Application topically 2 (two) times daily. To right AV fistula wound 15 g 0   bicalutamide (CASODEX) 50 MG tablet Take 50 mg by mouth every morning.     cinacalcet (SENSIPAR) 30 MG tablet Take 30 mg by mouth daily.     enalapril (VASOTEC) 20 MG tablet Take 20 mg by mouth 2 (two) times daily.      fluticasone (FLONASE) 50 MCG/ACT nasal spray Place 1 spray into both nostrils daily as needed for allergies or rhinitis.     HYDROcodone-acetaminophen (NORCO/VICODIN) 5-325 MG tablet Take 2 tablets by mouth every 6 (six) hours as needed for moderate pain. 20 tablet 0   lovastatin (MEVACOR) 20 MG tablet Take 20 mg by mouth at bedtime.     polyethylene glycol (MIRALAX / GLYCOLAX) 17 g packet Take 17 g by mouth daily as needed.     ULORIC 40 MG tablet Take 40 mg by mouth every morning.     vancomycin (VANCOREADY) 750 MG/150ML SOLN Inject 150 mLs (750 mg total) into the vein every Monday, Wednesday, and Friday with hemodialysis.     amLODipine (NORVASC) 10 MG tablet Take 10 mg by mouth every morning. (Patient not taking: Reported on 01/02/2023)     metoprolol (LOPRESSOR) 50 MG tablet Take 25 mg by mouth 2 (two) times daily. (Patient not taking: Reported on 01/02/2023)     No current facility-administered medications on file prior to visit.    There are no Patient Instructions on file for this visit. No follow-ups on file.   Georgiana Spinner, NP

## 2023-08-27 ENCOUNTER — Ambulatory Visit: Payer: Medicare Other | Admitting: Urology

## 2023-08-31 ENCOUNTER — Encounter: Payer: Self-pay | Admitting: Urology

## 2023-09-03 ENCOUNTER — Ambulatory Visit: Payer: Medicare Other | Admitting: Urology

## 2023-09-11 NOTE — Progress Notes (Deleted)
09/13/2023 4:13 PM   [name] [name] 09-26-1940 [id number]  Referring provider: Marisue Ivan, MD [phone] [facility] [address] [facility] [place],  [place] [address]  Urological history: 1. Renal mass -MRI (05/2022) -complex interpolar left renal lesion remaining indeterminate favored to represent a hemorrhagic/proteinaceous cyst  2. Prostate cancer -PSA (03/2023) - 0.78 -Gleason's 4 + 3, managed with ADT and Casodex   No chief complaint on file.   HPI: [name] [name] is a 83 y.o. male who presents today for renal mass.    Previous records reviewed.     PMH: Past Medical History:  Diagnosis Date   Anemia    Arthritis    Cancer (HCC)    Chronic kidney disease    Diabetes mellitus without complication (HCC)    ESRD (end stage renal disease) (HCC)    GERD (gastroesophageal reflux disease)    h/o   Gout    Hyperlipidemia, mixed    Hypertension    Hypokalemia    Renal mass    Secondary hyperparathyroidism of renal origin (HCC)    Sleep apnea    h/o no cpap   Thrombocytopenia (HCC)     Surgical History: Past Surgical History:  Procedure Laterality Date   APPENDECTOMY     AV FISTULA PLACEMENT Right 10/26/2022   Procedure: ARTERIOVENOUS (AV) FISTULA CREATION;  Surgeon: Annice Needy, MD;  Location: [facility] ORS;  Service: Vascular;  Laterality: Right;   Bunion Removal Right    CAPD INSERTION N/A 11/23/2021   Procedure: LAPAROSCOPIC INSERTION CONTINUOUS AMBULATORY PERITONEAL DIALYSIS  (CAPD) CATHETER;  Surgeon: Henrene Dodge, MD;  Location: [facility];  Service: General;  Laterality: N/A;   CAPD REVISION N/A 01/31/2022   Procedure: LAPAROSCOPIC REVISION CONTINUOUS AMBULATORY PERITONEAL DIALYSIS  (CAPD) CATHETER;  Surgeon: Henrene Dodge, MD;  Location: [facility];  Service: General;  Laterality: N/A;   CAPD REVISION N/A 04/06/2022   Procedure: LAPAROSCOPIC REVISION CONTINUOUS AMBULATORY PERITONEAL DIALYSIS  (CAPD) CATHETER, replacement;  Surgeon: Henrene Dodge, MD;   Location: [facility];  Service: General;  Laterality: N/A;   CAPD REVISION N/A 04/14/2022   Procedure: LAPAROSCOPIC ASSISTED CONTINUOUS AMBULATORY PERITONEAL DIALYSIS  (CAPD) CATHETER REMOVAL;  Surgeon: Henrene Dodge, MD;  Location: [facility];  Service: General;  Laterality: N/A;   COLONOSCOPY WITH PROPOFOL N/A 06/14/2015   Procedure: COLONOSCOPY WITH PROPOFOL;  Surgeon: Christena Deem, MD;  Location: [facility] ENDOSCOPY;  Service: Endoscopy;  Laterality: N/A;   DIALYSIS/PERMA CATHETER INSERTION N/A 10/03/2021   Procedure: DIALYSIS/PERMA CATHETER INSERTION;  Surgeon: Annice Needy, MD;  Location: [facility] INVAS[facility];  Service: Cardiovascular;  Laterality: N/A;   DIALYSIS/PERMA CATHETER REMOVAL N/A 02/12/2023   Procedure: DIALYSIS/PERMA CATHETER REMOVAL;  Surgeon: Annice Needy, MD;  Location: [facility] IN[facility] CV LAB;  Service: Cardiovascular;  Laterality: N/A;   EYE SURGERY     HERNIA REPAIR     abd   INSERTION OF MESH  11/23/2021   Procedure: INSERTION OF MESH;  Surgeon: Henrene Dodge, MD;  Location: [facility];  Service: General;;   PROSTATE BIOPSY N/A 03/27/2017   Procedure: PROSTATE BIOPSY-URO NAV;  Surgeon: Orson Ape, MD;  Location: [facility];  Service: Urology;  Laterality: N/A;   PROSTATE BIOPSY N/A 09/11/2019   Procedure: PROSTATE BIOPSY Michell Heinrich;  Surgeon: Orson Ape, MD;  Location: [facility];  Service: Urology;  Laterality: N/A;   RETINAL DETACHMENT SURGERY Left    TRANSURETHRAL RESECTION OF PROSTATE     UPPER EXTREMITY VENOGRAPHY Right 11/20/2022   Procedure: UPPER  EXTREMITY VENOGRAPHY;  Surgeon: Annice Needy, MD;  Location: [facility];  Service: Cardiovascular;  Laterality: Right;   UPPER EXTREMITY VENOGRAPHY Right 11/24/2022   Procedure: UPPER EXTREMITY VENOGRAPHY;  Surgeon: Annice Needy, MD;  Location: [facility];  Service: Cardiovascular;  Laterality: Right;   XI ROBOTIC ASSISTED VENTRAL HERNIA N/A 11/23/2021   Procedure: XI ROBOTIC ASSISTED VENTRAL HERNIA,  incisional;  Surgeon: Henrene Dodge, MD;  Location: [facility];  Service: General;  Laterality: N/A;    Home Medications:  Allergies as of 09/13/2023       Reactions   Fish Allergy Swelling   Morphine Other (See Comments)   About 5-10 minutes after admin of 2 mg morphine IV, patient became very agitated and tearful, loudly expressing desire to "be taken by Jesus", unable to reason with or calm patient   Ciprofloxacin    Unknown reaction   Doxazosin Other (See Comments)   unknown        Medication List        Accurate as of September 11, 2023  4:13 PM. If you have any questions, ask your nurse or doctor.          acetaminophen 500 MG tablet Commonly known as: TYLENOL Take 2 tablets (1,000 mg total) by mouth every 6 (six) hours as needed for mild pain. What changed: when to take this   amLODipine 10 MG tablet Commonly known as: NORVASC Take 10 mg by mouth every morning.   aspirin EC 81 MG tablet Take 81 mg by mouth daily. Swallow whole.   azelastine 0.1 % nasal spray Commonly known as: ASTELIN Place 1 spray into both nostrils 2 (two) times daily as needed for rhinitis. Use in each nostril as directed   bacitracin 500 UNIT/GM ointment Apply 1 Application topically 2 (two) times daily. To right AV fistula wound   bicalutamide 50 MG tablet Commonly known as: CASODEX Take 50 mg by mouth every morning.   cinacalcet 30 MG tablet Commonly known as: SENSIPAR Take 30 mg by mouth daily.   enalapril 20 MG tablet Commonly known as: VASOTEC Take 20 mg by mouth 2 (two) times daily.   fluticasone 50 MCG/ACT nasal spray Commonly known as: FLONASE Place 1 spray into both nostrils daily as needed for allergies or rhinitis.   HYDROcodone-acetaminophen 5-325 MG tablet Commonly known as: NORCO/VICODIN Take 2 tablets by mouth every 6 (six) hours as needed for moderate pain.   lovastatin 20 MG tablet Commonly known as: MEVACOR Take 20 mg by mouth at bedtime.   metoprolol  tartrate 50 MG tablet Commonly known as: LOPRESSOR Take 25 mg by mouth 2 (two) times daily.   polyethylene glycol 17 g packet Commonly known as: MIRALAX / GLYCOLAX Take 17 g by mouth daily as needed.   Uloric 40 MG tablet Generic drug: febuxostat Take 40 mg by mouth every morning.   vancomycin 750 MG/150ML Soln Commonly known as: VANCOREADY Inject 150 mLs (750 mg total) into the vein every Monday, Wednesday, and Friday with hemodialysis.        Allergies:  Allergies  Allergen Reactions   Fish Allergy Swelling   Morphine Other (See Comments)    About 5-10 minutes after admin of 2 mg morphine IV, patient became very agitated and tearful, loudly expressing desire to "be taken by Jesus", unable to reason with or calm patient   Ciprofloxacin     Unknown reaction   Doxazosin Other (See Comments)    unknown  Family History: Family History  Problem Relation Age of Onset   Leukemia Father    Leukemia Brother     Social History:  reports that he has never smoked. He has never used smokeless tobacco. He reports that he does not currently use alcohol. He reports that he does not use drugs.  ROS: Pertinent ROS in HPI  Physical Exam: There were no vitals taken for this visit.  Constitutional:  Well nourished. Alert and oriented, No acute distress. HEENT: [place] AT, moist mucus membranes.  Trachea midline, no masses. Cardiovascular: No clubbing, cyanosis, or edema. Respiratory: Normal respiratory effort, no increased work of breathing. GI: Abdomen is soft, non tender, non distended, no abdominal masses. Liver and spleen not palpable.  No hernias appreciated.  Stool sample for occult testing is not indicated.   GU: No CVA tenderness.  No bladder fullness or masses.  Patient with circumcised/uncircumcised phallus. ***Foreskin easily retracted***  Urethral meatus is patent.  No penile discharge. No penile lesions or rashes. Scrotum without lesions, cysts, rashes and/or edema.   Testicles are located scrotally bilaterally. No masses are appreciated in the testicles. Left and right epididymis are normal. Rectal: Patient with  normal sphincter tone. Anus and perineum without scarring or rashes. No rectal masses are appreciated. Prostate is approximately *** grams, *** nodules are appreciated. Seminal vesicles are normal. Skin: No rashes, bruises or suspicious lesions. Lymph: No cervical or inguinal adenopathy. Neurologic: Grossly intact, no focal deficits, moving all 4 extremities. Psychiatric: Normal mood and affect.  Laboratory Data: Lab Results  Component Value Date   WBC 11.4 (H) 11/29/2022   HGB 8.9 (L) 11/29/2022   HCT 29.3 (L) 11/29/2022   MCV 99.0 11/29/2022   PLT 110 (L) 11/29/2022    Lab Results  Component Value Date   CREATININE 5.90 (H) 11/29/2022    Lab Results  Component Value Date   HGBA1C 5.6 11/18/2022    Lab Results  Component Value Date   AST 20 11/29/2022   Lab Results  Component Value Date   ALT 7 11/29/2022  I have reviewed the labs.   Pertinent Imaging: N/A  Assessment & Plan:  ***  1. Renal mass -order a renal MRI for surveillance  2. Prostate cancer -PSA pending -continue Casodex    No follow-ups on file.  These notes generated with voice recognition software. I apologize for typographical errors.  Cloretta Ned  [facility] [address]  [address] [place], [place] [address] [phone]

## 2023-09-13 ENCOUNTER — Ambulatory Visit: Payer: Medicare Other | Admitting: Urology

## 2023-09-13 DIAGNOSIS — C61 Malignant neoplasm of prostate: Secondary | ICD-10-CM

## 2023-09-13 DIAGNOSIS — N2889 Other specified disorders of kidney and ureter: Secondary | ICD-10-CM

## 2023-09-14 ENCOUNTER — Encounter: Payer: Self-pay | Admitting: Urology

## 2023-10-01 NOTE — Progress Notes (Deleted)
 10/02/2023 9:49 AM   [name] [name] [birth year] [id number]  Referring provider: Marisue Ivan, MD [phone] [facility] [address] [facility] [place],  [place] [address]  Urological history: 1. Renal mass -MRI (05/2022) -complex interpolar left renal lesion remaining indeterminate favored to represent a hemorrhagic/proteinaceous cyst  2. Prostate cancer -PSA (03/2023) - 0.78 -Gleason's 4 + 3, managed with ADT and Casodex   No chief complaint on file.  HPI: [name] [name] is a 83 y.o. male who presents today for renal mass.    Previous records reviewed.     PMH: Past Medical History:  Diagnosis Date   Anemia    Arthritis    Cancer (HCC)    Chronic kidney disease    Diabetes mellitus without complication (HCC)    ESRD (end stage renal disease) (HCC)    GERD (gastroesophageal reflux disease)    h/o   Gout    Hyperlipidemia, mixed    Hypertension    Hypokalemia    Renal mass    Secondary hyperparathyroidism of renal origin (HCC)    Sleep apnea    h/o no cpap   Thrombocytopenia (HCC)     Surgical History: Past Surgical History:  Procedure Laterality Date   APPENDECTOMY     AV FISTULA PLACEMENT Right 10/26/2022   Procedure: ARTERIOVENOUS (AV) FISTULA CREATION;  Surgeon: Annice Needy, MD;  Location: [facility];  Service: Vascular;  Laterality: Right;   Bunion Removal Right    CAPD INSERTION N/A 11/23/2021   Procedure: LAPAROSCOPIC INSERTION CONTINUOUS AMBULATORY PERITONEAL DIALYSIS  (CAPD) CATHETER;  Surgeon: Henrene Dodge, MD;  Location: [facility];  Service: General;  Laterality: N/A;   CAPD REVISION N/A 01/31/2022   Procedure: LAPAROSCOPIC REVISION CONTINUOUS AMBULATORY PERITONEAL DIALYSIS  (CAPD) CATHETER;  Surgeon: Henrene Dodge, MD;  Location: [facility];  Service: General;  Laterality: N/A;   CAPD REVISION N/A 04/06/2022   Procedure: LAPAROSCOPIC REVISION CONTINUOUS AMBULATORY PERITONEAL DIALYSIS  (CAPD) CATHETER, replacement;  Surgeon: Henrene Dodge, MD;   Location: [facility];  Service: General;  Laterality: N/A;   CAPD REVISION N/A 04/14/2022   Procedure: LAPAROSCOPIC ASSISTED CONTINUOUS AMBULATORY PERITONEAL DIALYSIS  (CAPD) CATHETER REMOVAL;  Surgeon: Henrene Dodge, MD;  Location: [facility];  Service: General;  Laterality: N/A;   COLONOSCOPY WITH PROPOFOL N/A 06/14/2015   Procedure: COLONOSCOPY WITH PROPOFOL;  Surgeon: Christena Deem, MD;  Location: [facility] ENDOSCOPY;  Service: Endoscopy;  Laterality: N/A;   DIALYSIS/PERMA CATHETER INSERTION N/A 10/03/2021   Procedure: DIALYSIS/PERMA CATHETER INSERTION;  Surgeon: Annice Needy, MD;  Location: [facility] INV[facility]IVE CV LAB;  Service: Cardiovascular;  Laterality: N/A;   DIALYSIS/PERMA CATHETER REMOVAL N/A 02/12/2023   Procedure: DIALYSIS/PERMA CATHETER REMOVAL;  Surgeon: Annice Needy, MD;  Location: [facility];  Service: Cardiovascular;  Laterality: N/A;   EYE SURGERY     HERNIA REPAIR     abd   INSERTION OF MESH  11/23/2021   Procedure: INSERTION OF MESH;  Surgeon: Henrene Dodge, MD;  Location: [facility];  Service: General;;   PROSTATE BIOPSY N/A 03/27/2017   Procedure: PROSTATE BIOPSY-URO NAV;  Surgeon: Orson Ape, MD;  Location: [facility];  Service: Urology;  Laterality: N/A;   PROSTATE BIOPSY N/A 09/11/2019   Procedure: PROSTATE BIOPSY Michell Heinrich;  Surgeon: Orson Ape, MD;  Location: [facility];  Service: Urology;  Laterality: N/A;   RETINAL DETACHMENT SURGERY Left    TRANSURETHRAL RESECTION OF PROSTATE     UPPER EXTREMITY VENOGRAPHY Right 11/20/2022   Procedure: UPPER EXTREMITY  VENOGRAPHY;  Surgeon: Annice Needy, MD;  Location: [facility];  Service: Cardiovascular;  Laterality: Right;   UPPER EXTREMITY VENOGRAPHY Right 11/24/2022   Procedure: UPPER EXTREMITY VENOGRAPHY;  Surgeon: Annice Needy, MD;  Location: [facility];  Service: Cardiovascular;  Laterality: Right;   XI ROBOTIC ASSISTED VENTRAL HERNIA N/A 11/23/2021   Procedure: XI ROBOTIC ASSISTED VENTRAL HERNIA,  incisional;  Surgeon: Henrene Dodge, MD;  Location: [facility];  Service: General;  Laterality: N/A;    Home Medications:  Allergies as of 10/02/2023       Reactions   Fish Allergy Swelling   Morphine Other (See Comments)   About 5-10 minutes after admin of 2 mg morphine IV, patient became very agitated and tearful, loudly expressing desire to "be taken by Jesus", unable to reason with or calm patient   Ciprofloxacin    Unknown reaction   Doxazosin Other (See Comments)   unknown        Medication List        Accurate as of October 01, 2023  9:49 AM. If you have any questions, ask your nurse or doctor.          acetaminophen 500 MG tablet Commonly known as: TYLENOL Take 2 tablets (1,000 mg total) by mouth every 6 (six) hours as needed for mild pain. What changed: when to take this   amLODipine 10 MG tablet Commonly known as: NORVASC Take 10 mg by mouth every morning.   aspirin EC 81 MG tablet Take 81 mg by mouth daily. Swallow whole.   azelastine 0.1 % nasal spray Commonly known as: ASTELIN Place 1 spray into both nostrils 2 (two) times daily as needed for rhinitis. Use in each nostril as directed   bacitracin 500 UNIT/GM ointment Apply 1 Application topically 2 (two) times daily. To right AV fistula wound   bicalutamide 50 MG tablet Commonly known as: CASODEX Take 50 mg by mouth every morning.   cinacalcet 30 MG tablet Commonly known as: SENSIPAR Take 30 mg by mouth daily.   enalapril 20 MG tablet Commonly known as: VASOTEC Take 20 mg by mouth 2 (two) times daily.   fluticasone 50 MCG/ACT nasal spray Commonly known as: FLONASE Place 1 spray into both nostrils daily as needed for allergies or rhinitis.   HYDROcodone-acetaminophen 5-325 MG tablet Commonly known as: NORCO/VICODIN Take 2 tablets by mouth every 6 (six) hours as needed for moderate pain.   lovastatin 20 MG tablet Commonly known as: MEVACOR Take 20 mg by mouth at bedtime.   metoprolol  tartrate 50 MG tablet Commonly known as: LOPRESSOR Take 25 mg by mouth 2 (two) times daily.   polyethylene glycol 17 g packet Commonly known as: MIRALAX / GLYCOLAX Take 17 g by mouth daily as needed.   Uloric 40 MG tablet Generic drug: febuxostat Take 40 mg by mouth every morning.   vancomycin 750 MG/150ML Soln Commonly known as: VANCOREADY Inject 150 mLs (750 mg total) into the vein every Monday, Wednesday, and Friday with hemodialysis.        Allergies:  Allergies  Allergen Reactions   Fish Allergy Swelling   Morphine Other (See Comments)    About 5-10 minutes after admin of 2 mg morphine IV, patient became very agitated and tearful, loudly expressing desire to "be taken by Jesus", unable to reason with or calm patient   Ciprofloxacin     Unknown reaction   Doxazosin Other (See Comments)    unknown  Family History: Family History  Problem Relation Age of Onset   Leukemia Father    Leukemia Brother     Social History:  reports that he has never smoked. He has never used smokeless tobacco. He reports that he does not currently use alcohol. He reports that he does not use drugs.  ROS: Pertinent ROS in HPI  Physical Exam: There were no vitals taken for this visit.  Constitutional:  Well nourished. Alert and oriented, No acute distress. HEENT: [place] AT, moist mucus membranes.  Trachea midline, no masses. Cardiovascular: No clubbing, cyanosis, or edema. Respiratory: Normal respiratory effort, no increased work of breathing. GI: Abdomen is soft, non tender, non distended, no abdominal masses. Liver and spleen not palpable.  No hernias appreciated.  Stool sample for occult testing is not indicated.   GU: No CVA tenderness.  No bladder fullness or masses.  Patient with circumcised/uncircumcised phallus. ***Foreskin easily retracted***  Urethral meatus is patent.  No penile discharge. No penile lesions or rashes. Scrotum without lesions, cysts, rashes and/or edema.   Testicles are located scrotally bilaterally. No masses are appreciated in the testicles. Left and right epididymis are normal. Rectal: Patient with  normal sphincter tone. Anus and perineum without scarring or rashes. No rectal masses are appreciated. Prostate is approximately *** grams, *** nodules are appreciated. Seminal vesicles are normal. Skin: No rashes, bruises or suspicious lesions. Lymph: No cervical or inguinal adenopathy. Neurologic: Grossly intact, no focal deficits, moving all 4 extremities. Psychiatric: Normal mood and affect.  Laboratory Data: Lab Results  Component Value Date   WBC 11.4 (H) 11/29/2022   HGB 8.9 (L) 11/29/2022   HCT 29.3 (L) 11/29/2022   MCV 99.0 11/29/2022   PLT 110 (L) 11/29/2022    Lab Results  Component Value Date   CREATININE 5.90 (H) 11/29/2022    Lab Results  Component Value Date   HGBA1C 5.6 11/18/2022    Lab Results  Component Value Date   AST 20 11/29/2022   Lab Results  Component Value Date   ALT 7 11/29/2022  I have reviewed the labs.   Pertinent Imaging: N/A  Assessment & Plan:  ***  1. Renal mass -order a renal MRI for surveillance  2. Prostate cancer -PSA pending -continue Casodex    No follow-ups on file.  These notes generated with voice recognition software. I apologize for typographical errors.  Cloretta Ned  [facility] [address]  [address] [place], [place] [address] [phone]

## 2023-10-02 ENCOUNTER — Ambulatory Visit: Payer: Medicare Other | Admitting: Urology

## 2023-10-02 DIAGNOSIS — N2889 Other specified disorders of kidney and ureter: Secondary | ICD-10-CM

## 2023-10-02 DIAGNOSIS — C61 Malignant neoplasm of prostate: Secondary | ICD-10-CM

## 2023-10-03 ENCOUNTER — Encounter: Payer: Self-pay | Admitting: Urology

## 2023-10-30 ENCOUNTER — Other Ambulatory Visit (INDEPENDENT_AMBULATORY_CARE_PROVIDER_SITE_OTHER): Payer: Self-pay | Admitting: Nurse Practitioner

## 2023-10-30 DIAGNOSIS — N186 End stage renal disease: Secondary | ICD-10-CM

## 2023-11-01 ENCOUNTER — Encounter (INDEPENDENT_AMBULATORY_CARE_PROVIDER_SITE_OTHER): Payer: Medicare Other

## 2023-11-01 ENCOUNTER — Ambulatory Visit (INDEPENDENT_AMBULATORY_CARE_PROVIDER_SITE_OTHER): Payer: Medicare Other | Admitting: Nurse Practitioner

## 2023-11-11 ENCOUNTER — Encounter: Payer: Self-pay | Admitting: Oncology

## 2023-11-11 ENCOUNTER — Emergency Department
Admission: EM | Admit: 2023-11-11 | Discharge: 2023-11-11 | Disposition: A | Discharge status: Home / Self Care | Attending: Emergency Medicine | Admitting: Emergency Medicine

## 2023-11-11 ENCOUNTER — Emergency Department

## 2023-11-11 DIAGNOSIS — J0110 Acute frontal sinusitis, unspecified: Secondary | ICD-10-CM | POA: Insufficient documentation

## 2023-11-11 DIAGNOSIS — Z992 Dependence on renal dialysis: Secondary | ICD-10-CM | POA: Diagnosis not present

## 2023-11-11 DIAGNOSIS — N186 End stage renal disease: Secondary | ICD-10-CM | POA: Insufficient documentation

## 2023-11-11 DIAGNOSIS — I120 Hypertensive chronic kidney disease with stage 5 chronic kidney disease or end stage renal disease: Secondary | ICD-10-CM | POA: Insufficient documentation

## 2023-11-11 DIAGNOSIS — E1122 Type 2 diabetes mellitus with diabetic chronic kidney disease: Secondary | ICD-10-CM | POA: Insufficient documentation

## 2023-11-11 DIAGNOSIS — R413 Other amnesia: Secondary | ICD-10-CM | POA: Insufficient documentation

## 2023-11-11 LAB — CBC WITH DIFFERENTIAL/PLATELET
Abs Immature Granulocytes: 0.02 10*3/uL (ref 0.00–0.07)
Basophils Absolute: 0 10*3/uL (ref 0.0–0.1)
Basophils Relative: 0 %
Eosinophils Absolute: 0.1 10*3/uL (ref 0.0–0.5)
Eosinophils Relative: 1 %
HCT: 32.1 % — ABNORMAL LOW (ref 39.0–52.0)
Hemoglobin: 10.4 g/dL — ABNORMAL LOW (ref 13.0–17.0)
Immature Granulocytes: 0 %
Lymphocytes Relative: 45 %
Lymphs Abs: 4.1 10*3/uL — ABNORMAL HIGH (ref 0.7–4.0)
MCH: 31 pg (ref 26.0–34.0)
MCHC: 32.4 g/dL (ref 30.0–36.0)
MCV: 95.5 fL (ref 80.0–100.0)
Monocytes Absolute: 0.6 10*3/uL (ref 0.1–1.0)
Monocytes Relative: 7 %
Neutro Abs: 4.2 10*3/uL (ref 1.7–7.7)
Neutrophils Relative %: 47 %
Platelets: 101 10*3/uL — ABNORMAL LOW (ref 150–400)
RBC: 3.36 MIL/uL — ABNORMAL LOW (ref 4.22–5.81)
RDW: 15.9 % — ABNORMAL HIGH (ref 11.5–15.5)
WBC: 9.1 10*3/uL (ref 4.0–10.5)
nRBC: 0 % (ref 0.0–0.2)

## 2023-11-11 LAB — COMPREHENSIVE METABOLIC PANEL
ALT: 11 U/L (ref 0–44)
AST: 16 U/L (ref 15–41)
Albumin: 3.4 g/dL — ABNORMAL LOW (ref 3.5–5.0)
Alkaline Phosphatase: 83 U/L (ref 38–126)
Anion gap: 11 (ref 5–15)
BUN: 46 mg/dL — ABNORMAL HIGH (ref 8–23)
CO2: 25 mmol/L (ref 22–32)
Calcium: 9.7 mg/dL (ref 8.9–10.3)
Chloride: 102 mmol/L (ref 98–111)
Creatinine, Ser: 7.14 mg/dL — ABNORMAL HIGH (ref 0.61–1.24)
GFR, Estimated: 7 mL/min — ABNORMAL LOW (ref >60)
Glucose, Bld: 102 mg/dL — ABNORMAL HIGH (ref 70–99)
Potassium: 3.7 mmol/L (ref 3.5–5.1)
Sodium: 138 mmol/L (ref 135–145)
Total Bilirubin: 0.6 mg/dL (ref 0.0–1.2)
Total Protein: 6.9 g/dL (ref 6.5–8.1)

## 2023-11-11 LAB — RESP PANEL BY RT-PCR (RSV, FLU A&B, COVID)  RVPGX2
Influenza A by PCR: NEGATIVE
Influenza B by PCR: NEGATIVE
Resp Syncytial Virus by PCR: NEGATIVE
SARS Coronavirus 2 by RT PCR: NEGATIVE

## 2023-11-11 LAB — MAGNESIUM: Magnesium: 2.1 mg/dL (ref 1.7–2.4)

## 2023-11-11 LAB — TSH: TSH: 0.52 u[IU]/mL (ref 0.350–4.500)

## 2023-11-11 LAB — T4, FREE: Free T4: 0.94 ng/dL (ref 0.61–1.12)

## 2023-11-11 MED ORDER — AMOXICILLIN-POT CLAVULANATE 875-125 MG PO TABS: 1.0000 | ORAL_TABLET | Freq: Two times a day (BID) | ORAL | 0 refills | Status: AC

## 2023-11-11 NOTE — ED Provider Notes (Signed)
 [facility] Provider Note    Event Date/Time   First MD Initiated Contact with Patient 11/11/23 1523     (approximate)   History   Memory Loss and generalized weakness   HPI [name] [name] is a 83 y.o. male with history of ESRD on dialysis, HTN, HLD, DM2 presenting today for memory loss and weakness.  Patient states for the past month he has had intermittent episodes with memory loss which has frustrated him.  He also has generalized weakness associated with this as well.  Symptoms seem to come and go.  Also notes mild congestion symptoms.  Otherwise, only chronic symptom is pain in his left hip for which she is already seeing orthopedics for for arthritis.  No other significant changes to that pain.  Denies any specific unilateral weakness or numbness.  No obvious speech changes.  No vision changes.  Otherwise denies chest pain, shortness of breath, abdominal pain, nausea, vomiting.     Physical Exam   Triage Vital Signs: ED Triage Vitals  Encounter Vitals Group     BP 11/11/23 1439 (!) 176/78     Systolic BP Percentile --      Diastolic BP Percentile --      Pulse Rate 11/11/23 1439 72     Resp 11/11/23 1439 18     Temp 11/11/23 1439 97.6 F (36.4 C)     Temp Source 11/11/23 1439 Oral     SpO2 11/11/23 1439 100 %     Weight 11/11/23 1440 171 lb 15.3 oz (78 kg)     Height 11/11/23 1440 5\' 9"  (1.753 m)     Head Circumference --      Peak Flow --      Pain Score 11/11/23 1439 0     Pain Loc --      Pain Education --      Exclude from Growth Chart --     Most recent vital signs: Vitals:   11/11/23 1439  BP: (!) 176/78  Pulse: 72  Resp: 18  Temp: 97.6 F (36.4 C)  SpO2: 100%   Physical Exam: I have reviewed the vital signs and nursing notes. General: Awake, alert, no acute distress.  Nontoxic appearing. Head:  Atraumatic, normocephalic.   ENT:  EOM intact, PERRL. Oral mucosa is pink and moist with no lesions. Neck: Neck is supple with  full range of motion, No meningeal signs. Cardiovascular:  RRR, No murmurs. Peripheral pulses palpable and equal bilaterally. Respiratory:  Symmetrical chest wall expansion.  No rhonchi, rales, or wheezes.  Good air movement throughout.  No use of accessory muscles.   Musculoskeletal:  No cyanosis or edema. Moving extremities with full ROM Abdomen:  Soft, nontender, nondistended. Neuro:  GCS 15, moving all four extremities, interacting appropriately. Speech clear.  Cranial nerves II through XII intact.  Sensation equal and intact throughout bilateral upper and lower extremities.  5 out of 5 strength throughout bilateral upper and lower extremities. Psych:  Calm, appropriate.   Skin:  Warm, dry, no rash.    ED Results / Procedures / Treatments   Labs (all labs ordered are listed, but only abnormal results are displayed) Labs Reviewed  COMPREHENSIVE METABOLIC PANEL - Abnormal; Notable for the following components:      Result Value   Glucose, Bld 102 (*)    BUN 46 (*)    Creatinine, Ser 7.14 (*)    Albumin 3.4 (*)    GFR, Estimated 7 (*)  All other components within normal limits  CBC WITH DIFFERENTIAL/PLATELET - Abnormal; Notable for the following components:   RBC 3.36 (*)    Hemoglobin 10.4 (*)    HCT 32.1 (*)    RDW 15.9 (*)    Platelets 101 (*)    Lymphs Abs 4.1 (*)    All other components within normal limits  RESP PANEL BY RT-PCR (RSV, FLU A&B, COVID)  RVPGX2  MAGNESIUM  TSH  T4, FREE     EKG    RADIOLOGY Independently interpreted CT imaging with no acute pathology   PROCEDURES:  Critical Care performed: No  Procedures   MEDICATIONS ORDERED IN ED: Medications - No data to display   IMPRESSION / MDM / ASSESSMENT AND PLAN / ED COURSE  I reviewed the triage vital signs and the nursing notes.                              Differential diagnosis includes, but is not limited to, respiratory infection such as COVID, flu, RSV, UTI, mild cognitive  impairment, early dementia, lower suspicion CVA  Patient's presentation is most consistent with acute complicated illness / injury requiring diagnostic workup.  Patient is an 83 year old male presenting today for 1 month of intermittent generalized weakness and memory loss issues.  His exam today does not show any acute altered mental status.  Neurological exam unremarkable with normal cranial nerves and sensation and strength intact throughout all extremities.  Physical exam elsewise reassuring.  Vital signs stable.  Laboratory workup today all reassuring and consistent with known ESRD on dialysis.  CT imaging of the head shows chronic atrophy but no other acute pathology.  Questionable sinusitis which would match up with patient's congestion.  Otherwise negative for COVID, flu, RSV.  He was not able to make any urine today given his history on dialysis so no need to test for his UA.  Given otherwise reassuring workup, patient is safe for discharge with no acute life-threatening processes or concerns for admission today.  Will have him follow-up with a primary care provider to evaluate for other cognitive impairment or early dementia signs.  Patient sent with antibiotics to treat sinusitis and given strict return precautions.  The patient is on the cardiac monitor to evaluate for evidence of arrhythmia and/or significant heart rate changes.     FINAL CLINICAL IMPRESSION(S) / ED DIAGNOSES   Final diagnoses:  Memory loss  Acute non-recurrent frontal sinusitis     Rx / DC Orders   ED Discharge Orders          Ordered    amoxicillin-clavulanate (AUGMENTIN) 875-125 MG tablet  2 times daily        11/11/23 1728             Note:  This document was prepared using Dragon voice recognition software and may include unintentional dictation errors.   Janith Lima, MD 11/11/23 [phone]

## 2023-11-11 NOTE — Discharge Instructions (Signed)
 Please follow-up with your primary care provider soon for ongoing workup of your memory loss.  This could be early signs of dementia contributing to this.  I have also sent an antibiotic to your pharmacy to help potentially treat a sinus infection.

## 2023-11-11 NOTE — ED Triage Notes (Signed)
 Pt presents to the ED for memory loss and generalized weakness x1 month. Pt states that he just doesn't feel like he is thinking correctly. Per brother this has been going on for about a month. States that he gets frustrated and agitated easily and complains about chronic pain.

## 2023-12-21 ENCOUNTER — Ambulatory Visit (LOCAL_COMMUNITY_HEALTH_CENTER)

## 2023-12-21 DIAGNOSIS — Z23 Encounter for immunization: Secondary | ICD-10-CM

## 2023-12-21 DIAGNOSIS — Z719 Counseling, unspecified: Secondary | ICD-10-CM

## 2023-12-21 NOTE — Progress Notes (Signed)
 Homevisit to administer covid vaccine.  Vaccine screening/administration form completed; signed by pt.  (Sent for scanning).   VIS given.  Comirnaty 12+ 2024-25 administered by Moishe Angel RN; tolerated well.  Post vaccination instructions given.  [facility] updated and copy mailed to pt.  Macon Scala, RN

## 2024-01-03 ENCOUNTER — Encounter: Payer: Self-pay | Admitting: Oncology

## 2024-01-10 ENCOUNTER — Ambulatory Visit
Attending: Student in an Organized Health Care Education/Training Program | Admitting: Student in an Organized Health Care Education/Training Program

## 2024-01-10 ENCOUNTER — Encounter: Payer: Self-pay | Admitting: Student in an Organized Health Care Education/Training Program

## 2024-01-10 VITALS — BP 151/71 | HR 74 | Temp 98.5°F | Ht 68.0 in | Wt 171.0 lb

## 2024-01-10 DIAGNOSIS — E1140 Type 2 diabetes mellitus with diabetic neuropathy, unspecified: Secondary | ICD-10-CM | POA: Diagnosis present

## 2024-01-10 DIAGNOSIS — M79604 Pain in right leg: Secondary | ICD-10-CM | POA: Insufficient documentation

## 2024-01-10 DIAGNOSIS — M25552 Pain in left hip: Secondary | ICD-10-CM | POA: Diagnosis present

## 2024-01-10 DIAGNOSIS — M25511 Pain in right shoulder: Secondary | ICD-10-CM | POA: Diagnosis present

## 2024-01-10 DIAGNOSIS — M79605 Pain in left leg: Secondary | ICD-10-CM | POA: Diagnosis present

## 2024-01-10 DIAGNOSIS — G8929 Other chronic pain: Secondary | ICD-10-CM | POA: Insufficient documentation

## 2024-01-10 DIAGNOSIS — M170 Bilateral primary osteoarthritis of knee: Secondary | ICD-10-CM | POA: Insufficient documentation

## 2024-01-10 NOTE — Progress Notes (Signed)
 PROVIDER NOTE: Interpretation of information contained herein should be left to medically-trained personnel. Specific patient instructions are provided elsewhere under "Patient Instructions" section of medical record. This document was created in part using AI and STT-dictation technology, any transcriptional errors that may result from this process are unintentional.  Patient: [name] [name]  Service: E/M Encounter  Provider: Cephus Collin, MD  DOB: 1941-02-04  Delivery: Face-to-face  Specialty: Interventional Pain Management  MRN: [id number]  Setting: Ambulatory outpatient facility  Specialty designation: 09  Type: New Patient  Location: Outpatient office facility  PCP: Monique Ano, MD  DOS: 01/10/2024    Referring Prov.: Rhesa Celeste Munsoor, MD   Primary Reason(s) for Visit: Encounter for initial evaluation of one or more chronic problems (new to examiner) potentially causing chronic pain, and posing a threat to normal musculoskeletal function. (Level of risk: High) CC: Ankle Pain (left)  HPI  [name] [name] is a 83 y.o. year old, male patient, who comes for the first time to our practice referred by Quantez Schnyder, Munsoor, MD for our initial evaluation of his chronic pain. He has Thrombocytopenia (HCC); Chronic kidney disease, stage IV (severe) (HCC); Anemia in chronic kidney disease; Class 1 obesity due to excess calories with serious comorbidity and body mass index (BMI) of 34.0 to 34.9 in adult; Benign essential hypertension; 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HCC); History of gout; Mixed hyperlipidemia; Diabetes mellitus, type II (HCC); Proteinuria; Secondary hyperparathyroidism of renal origin (HCC); Seasonal allergic rhinitis due to pollen; Ventral hernia without obstruction or gangrene; Chronic kidney disease, stage V (HCC); ESRD on dialysis (HCC); 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; PD catheter dysfunction (HCC); Hypokalemia; Acute on chronic anemia; ESRD (end stage renal disease) on dialysis  (HCC); Gout; Acute lower UTI; Peritoneal dialysis catheter dysfunction (HCC); Internal jugular (IJ) vein thromboembolism, acute, right (HCC); Postoperative complication of skin involving drainage from surgical wound from right arm AVF placement; Cellulitis of right arm; S/P arteriovenous (AV) fistula creation 10/26/22; Chronic painful diabetic neuropathy (HCC); Bilateral primary osteoarthritis of knee; Bilateral leg pain; Left hip pain; and Chronic right shoulder pain on their problem list. Today he comes in for evaluation of his Ankle Pain (left)  Pain Assessment: Location: Left Ankle (left hip, r shoulder, neck evreywhere) Radiating: pain radiaities everywhere Onset: More than a month ago Duration: Chronic pain Quality: Throbbing, Aching, Nagging, Burning, Constant Severity: 10-Worst pain ever/10 (subjective, self-reported pain score)  Effect on ADL: limits my daily activities Timing: Constant Modifying factors: meds, heat BP: (!) 151/71  HR: 74  Onset and Duration: Sudden Cause of pain: Work related accident or event Severity: Getting worse, No change since onset, NAS-11 at its worse: 9/10, NAS-11 at its best: 5/10, NAS-11 now: 4/10, and NAS-11 on the average: 7/10 Timing: During activity or exercise Aggravating Factors: dialysis Alleviating Factors: Resting, Sitting, and Walking Associated Problems: Dizziness, Fatigue, Numbness, and Pain that wakes patient up Quality of Pain: Aching, Horrible, Nagging, Stabbing, Tender, Throbbing, and Toothache-like Previous Examinations or Tests: X-rays Previous Treatments: Epidural steroid injections and Narcotic medications  [name] is being evaluated for possible interventional pain management therapies for the treatment of his chronic pain.   Discussed the use of AI scribe software for clinical note transcription with the patient, who gave verbal consent to proceed.  History of Present Illness   [name] [name] is an 83 year old male with  chronic kidney disease on dialysis who presents with severe joint pain. He is accompanied by his nephew. He was referred by Dr. Munsoor Cleophas Yoak for evaluation of his  chronic pain management.  He has been undergoing hemodialysis for approximately four years, attending sessions thrice weekly on Monday, Wednesday, and Friday. Initially, he attempted peritoneal dialysis at home, but it was unsuccessful after multiple attempts, necessitating a transition to regular hemodialysis.  He experiences severe pain in multiple joints, including the left hip, knees, lower back, and right shoulder. The pain radiates from his toes to his shins, with the most severe discomfort in his legs and knees. He describes the pain as persistent and debilitating, significantly impacting his mobility and quality of life.  He has a history of left hip and knee osteoarthritis, with x-rays taken at [facility]. A hip injection in March provided significant relief for about a month. He has not undergone any knee surgeries or received gel injections in the past.  He experiences occasional burning and tingling in his feet, which he attributes to his previous history of diabetes. He is taking gabapentin  at night for this.  He uses topical treatments like Voltaren gel for pain relief and finds some benefit from it. He also takes Tylenol  occasionally to help manage the pain.  He has a past medical history of diabetes, which he states he no longer has, and he was never on insulin . He was advised against taking Celebrex due to its potential impact on his kidneys.       Meds   Current Outpatient Medications:    aspirin EC 81 MG tablet, Take 81 mg by mouth daily. Swallow whole., Disp: , Rfl:    Cholecalciferol (D3 2000 PO), Take by mouth daily., Disp: , Rfl:    diclofenac Sodium (VOLTAREN) 1 % GEL, Apply topically daily as needed., Disp: , Rfl:    diphenhydrAMINE  (BENADRYL ) 25 mg capsule, Take 25 mg by mouth every 6 (six) hours as needed.,  Disp: , Rfl:    fluticasone  (FLONASE ) 50 MCG/ACT nasal spray, Place 1 spray into both nostrils daily as needed for allergies or rhinitis., Disp: , Rfl:    gabapentin  (NEURONTIN ) 300 MG capsule, Take 300 mg by mouth at bedtime., Disp: , Rfl:    polyethylene glycol (MIRALAX  / GLYCOLAX ) 17 g packet, Take 17 g by mouth daily as needed., Disp: , Rfl:    sodium bicarbonate  325 MG tablet, Take 325 mg by mouth 3 (three) times daily., Disp: , Rfl:   Imaging Review  Severe left hip osteoarthritis.  BRENDAN CHRISTOPHER CLINE, MD Narrative  EXAM: X-ray hip left 2 or 3 views with or without pelvis  INDICATION: chronic pain Primary osteoarthritis involving multiple joints [M15.0 (ICD-10-CM)]  COMPARISON: None  FINDINGS: No acute fracture or dislocation. Regional soft tissues unremarkable. No aggressive osseus lesion. Severe left hip degenerative change with bone-on-bone contact and subchondral sclerosis with cortical irregularity   Anatomical Region Laterality Modality  Knee Left -- Radiographic Imaging  Tib Fib Left -- --  Femur Left -- --  ORTHO Knee Left -- --   Impression  Similar appearance of severe left knee degenerative change.  BRENDAN CHRISTOPHER CLINE, MD Narrative  EXAM: X-ray knee left 3 views  INDICATION: chronic pain Primary osteoarthritis involving multiple joints [M15.0 (ICD-10-CM)]  COMPARISON: 03/13/2022  FINDINGS: No acute fracture or dislocation. Regional soft tissues unremarkable. No aggressive osseus lesion. Redemonstrated severe degenerative change in all 3 compartments of the left knee, with bone-on-bone contact, subchondral sclerosis and subchondral cortical irregularity.  AP lateral lumbar spine X-ray: Extensive degenerative disc disease, facet arthritis without spondylolisthesis or spondylolysis. SI joints with advanced arthritis. (11/01/2023) Exam End: 11/01/23 09:04  Complexity Note: Imaging results reviewed.                         ROS   Cardiovascular: Daily Aspirin intake Pulmonary or Respiratory: Temporary stoppage of breathing during sleep Neurological: No reported neurological signs or symptoms such as seizures, abnormal skin sensations, urinary and/or fecal incontinence, being born with an abnormal open spine and/or a tethered spinal cord Psychological-Psychiatric: Anxiousness and Depressed Gastrointestinal: Reflux or heatburn Genitourinary: Difficulty producing urine (Renal failure) Hematological: No reported hematological signs or symptoms such as prolonged bleeding, low or poor functioning platelets, bruising or bleeding easily, hereditary bleeding problems, low energy levels due to low hemoglobin or being anemic Endocrine: High thyroid  and No reported endocrine signs or symptoms such as high or low blood sugar, rapid heart rate due to high thyroid  levels, obesity or weight gain due to slow thyroid  or thyroid  disease Rheumatologic: No reported rheumatological signs and symptoms such as fatigue, joint pain, tenderness, swelling, redness, heat, stiffness, decreased range of motion, with or without associated rash Musculoskeletal: Negative for myasthenia gravis, muscular dystrophy, multiple sclerosis or malignant hyperthermia Work History: Retired  Allergies  Mr. Lopezgarcia is allergic to fish allergy, morphine , ciprofloxacin , and doxazosin.  Laboratory Chemistry Profile   Renal Lab Results  Component Value Date   BUN 46 (H) 11/11/2023   CREATININE 7.14 (H) 11/11/2023   GFRAA 10 (L) 06/17/2018   GFRNONAA 7 (L) 11/11/2023   PROTEINUR 100 (A) 04/14/2022     Electrolytes Lab Results  Component Value Date   NA 138 11/11/2023   K 3.7 11/11/2023   CL 102 11/11/2023   CALCIUM  9.7 11/11/2023   MG 2.1 11/11/2023   PHOS 6.5 (H) 11/22/2022     Hepatic Lab Results  Component Value Date   AST 16 11/11/2023   ALT 11 11/11/2023   ALBUMIN 3.4 (L) 11/11/2023   ALKPHOS 83 11/11/2023   LIPASE 45 04/13/2022      ID Lab Results  Component Value Date   SARSCOV2NAA NEGATIVE 11/11/2023   HCVAB NON REACTIVE 04/07/2022     Bone No results found for: "VD25OH", "VD125OH2TOT", "HY8657QI6", "NG2952WU1", "25OHVITD1", "25OHVITD2", "25OHVITD3", "TESTOFREE", "TESTOSTERONE"   Endocrine Lab Results  Component Value Date   GLUCOSE 102 (H) 11/11/2023   GLUCOSEU NEGATIVE 04/14/2022   HGBA1C 5.6 11/18/2022   TSH 0.520 11/11/2023   FREET4 0.94 11/11/2023     Neuropathy Lab Results  Component Value Date   VITAMINB12 224 04/05/2022   FOLATE 11.7 04/05/2022   HGBA1C 5.6 11/18/2022     CNS No results found for: "COLORCSF", "APPEARCSF", "RBCCOUNTCSF", "WBCCSF", "POLYSCSF", "LYMPHSCSF", "EOSCSF", "PROTEINCSF", "GLUCCSF", "JCVIRUS", "CSFOLI", "IGGCSF", "LABACHR", "ACETBL"   Inflammation (CRP: Acute  ESR: Chronic) No results found for: "CRP", "ESRSEDRATE", "LATICACIDVEN"   Rheumatology No results found for: "RF", "ANA", "LABURIC", "URICUR", "LYMEIGGIGMAB", "LYMEABIGMQN", "HLAB27"   Coagulation Lab Results  Component Value Date   INR 1.4 (H) 11/18/2022   LABPROT 16.7 (H) 11/18/2022   APTT 37 (H) 11/18/2022   PLT 101 (L) 11/11/2023     Cardiovascular Lab Results  Component Value Date   TROPONINI <0.03 06/17/2018   HGB 10.4 (L) 11/11/2023   HCT 32.1 (L) 11/11/2023     Screening Lab Results  Component Value Date   SARSCOV2NAA NEGATIVE 11/11/2023   HCVAB NON REACTIVE 04/07/2022     Cancer No results found for: "CEA", "CA125", "LABCA2"   Allergens No results found for: "ALMOND", "APPLE", "ASPARAGUS", "AVOCADO", "BANANA", "BARLEY", "BASIL", "BAYLEAF", "GREENBEAN", "LIMABEAN", "  WHITEBEAN", "BEEFIGE", "REDBEET", "BLUEBERRY", "BROCCOLI", "CABBAGE", "MELON", "CARROT", "CASEIN", "CASHEWNUT", "CAULIFLOWER", "CELERY"     Note: Lab results reviewed.  PFSH  Drug: Mr. Repsher  reports no history of drug use. Alcohol:  reports that he does not currently use alcohol. Tobacco:  reports that he has never  smoked. He has never used smokeless tobacco. Medical:  has a past medical history of Anemia, Arthritis, Cancer (HCC), Chronic kidney disease, Diabetes mellitus without complication (HCC), ESRD (end stage renal disease) (HCC), GERD (gastroesophageal reflux disease), Gout, Hyperlipidemia, mixed, Hypertension, Hypokalemia, Renal mass, Secondary hyperparathyroidism of renal origin [facility]), Sleep apnea, and Thrombocytopenia (HCC). Family: family history includes Leukemia in his brother and father.  Past Surgical History:  Procedure Laterality Date   APPENDECTOMY     AV FISTULA PLACEMENT Right 10/26/2022   Procedure: ARTERIOVENOUS (AV) FISTULA CREATION;  Surgeon: Celso College, MD;  Location: [facility] ORS;  Service: Vascular;  Laterality: Right;   Bunion Removal Right    CAPD INSERTION N/A 11/23/2021   Procedure: LAPAROSCOPIC INSERTION CONTINUOUS AMBULATORY PERITONEAL DIALYSIS  (CAPD) CATHETER;  Surgeon: Emmalene Hare, MD;  Location: [facility];  Service: General;  Laterality: N/A;   CAPD REVISION N/A 01/31/2022   Procedure: LAPAROSCOPIC REVISION CONTINUOUS AMBULATORY PERITONEAL DIALYSIS  (CAPD) CATHETER;  Surgeon: Emmalene Hare, MD;  Location: [facility];  Service: General;  Laterality: N/A;   CAPD REVISION N/A 04/06/2022   Procedure: LAPAROSCOPIC REVISION CONTINUOUS AMBULATORY PERITONEAL DIALYSIS  (CAPD) CATHETER, replacement;  Surgeon: Emmalene Hare, MD;  Location: [facility];  Service: General;  Laterality: N/A;   CAPD REVISION N/A 04/14/2022   Procedure: LAPAROSCOPIC ASSISTED CONTINUOUS AMBULATORY PERITONEAL DIALYSIS  (CAPD) CATHETER REMOVAL;  Surgeon: Emmalene Hare, MD;  Location: [facility];  Service: General;  Laterality: N/A;   COLONOSCOPY WITH PROPOFOL  N/A 06/14/2015   Procedure: COLONOSCOPY WITH PROPOFOL ;  Surgeon: Deveron Fly, MD;  Location: [facility] ENDOSCOPY;  Service: Endoscopy;  Laterality: N/A;   DIALYSIS/PERMA CATHETER INSERTION N/A 10/03/2021   Procedure: DIALYSIS/PERMA CATHETER INSERTION;  Surgeon:  Celso College, MD;  Location: [facility];  Service: Cardiovascular;  Laterality: N/A;   DIALYSIS/PERMA CATHETER REMOVAL N/A 02/12/2023   Procedure: DIALYSIS/PERMA CATHETER REMOVAL;  Surgeon: Celso College, MD;  Location: [facility];  Service: Cardiovascular;  Laterality: N/A;   EYE SURGERY     HERNIA REPAIR     abd   INSERTION OF MESH  11/23/2021   Procedure: INSERTION OF MESH;  Surgeon: Emmalene Hare, MD;  Location: [facility];  Service: General;;   PROSTATE BIOPSY N/A 03/27/2017   Procedure: PROSTATE BIOPSY-URO NAV;  Surgeon: Rea Cambridge, MD;  Location: [facility];  Service: Urology;  Laterality: N/A;   PROSTATE BIOPSY N/A 09/11/2019   Procedure: PROSTATE BIOPSY Venia Gilles;  Surgeon: Rea Cambridge, MD;  Location: [facility];  Service: Urology;  Laterality: N/A;   RETINAL DETACHMENT SURGERY Left    TRANSURETHRAL RESECTION OF PROSTATE     UPPER EXTREMITY VENOGRAPHY Right 11/20/2022   Procedure: UPPER EXTREMITY VENOGRAPHY;  Surgeon: Celso College, MD;  Location: [facility];  Service: Cardiovascular;  Laterality: Right;   UPPER EXTREMITY VENOGRAPHY Right 11/24/2022   Procedure: UPPER EXTREMITY VENOGRAPHY;  Surgeon: Celso College, MD;  Location: [facility];  Service: Cardiovascular;  Laterality: Right;   XI ROBOTIC ASSISTED VENTRAL HERNIA N/A 11/23/2021   Procedure: XI ROBOTIC ASSISTED VENTRAL HERNIA, incisional;  Surgeon: Emmalene Hare, MD;  Location: [facility];  Service: General;  Laterality: N/A;  Active Ambulatory Problems    Diagnosis Date Noted   Thrombocytopenia (HCC) 05/07/2016   Chronic kidney disease, stage IV (severe) (HCC) 05/07/2016   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   Class 1 obesity due to excess calories with serious comorbidity and body mass index (BMI) of 34.0 to 34.9 in adult 03/28/2019   Benign essential hypertension 03/28/2019   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HCC) 06/28/2016   History of gout 03/28/2019   Mixed hyperlipidemia  03/28/2019   Diabetes mellitus, type II (HCC) 03/28/2019   Proteinuria 06/14/2019   Secondary hyperparathyroidism of renal origin (HCC) 06/14/2019   Seasonal allergic rhinitis due to pollen 12/15/2020   Ventral hernia without obstruction or gangrene 12/15/2020   Chronic kidney disease, stage V (HCC) 09/20/2021   ESRD on dialysis [facility])    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    PD catheter dysfunction (HCC)    Hypokalemia 12/19/2021   Acute on chronic anemia 04/05/2022   ESRD (end stage renal disease) on dialysis (HCC) 04/14/2022   Gout 04/14/2022   Acute lower UTI 04/14/2022   Peritoneal dialysis catheter dysfunction (HCC) 04/14/2022   Internal jugular (IJ) vein thromboembolism, acute, right (HCC) 11/18/2022   Postoperative complication of skin involving drainage from surgical wound from right arm AVF placement 11/18/2022   Cellulitis of right arm 11/18/2022   S/P arteriovenous (AV) fistula creation 10/26/22 11/18/2022   Chronic painful diabetic neuropathy (HCC) 01/10/2024   Bilateral primary osteoarthritis of knee 01/10/2024   Bilateral leg pain 01/10/2024   Left hip pain 01/10/2024   Chronic right shoulder pain 01/10/2024   Resolved Ambulatory Problems    Diagnosis Date Noted   No Resolved Ambulatory Problems   Past Medical History:  Diagnosis Date   Anemia    Arthritis    Cancer (HCC)    Chronic kidney disease    Diabetes mellitus without complication (HCC)    ESRD (end stage renal disease) (HCC)    GERD (gastroesophageal reflux disease)    Hyperlipidemia, mixed    Hypertension    Renal mass    Sleep apnea    Constitutional Exam  General appearance: Well nourished, well developed, and well hydrated. In no apparent acute distress Vitals:   01/10/24 0927  BP: (!) 151/71  Pulse: 74  Temp: 98.5 F (36.9 C)  SpO2: 100%  Weight: 171 lb (77.6 kg)  Height: 5\' 8"  (1.727 m)   BMI Assessment: Estimated body mass index is 26 kg/m as calculated from the  following:   Height as of this encounter: 5\' 8"  (1.727 m).   Weight as of this encounter: 171 lb (77.6 kg).  BMI interpretation table: BMI level Category Range association with higher incidence of chronic pain  <18 kg/m2 Underweight   18.5-24.9 kg/m2 Ideal body weight   25-29.9 kg/m2 Overweight Increased incidence by 20%  30-34.9 kg/m2 Obese (Class I) Increased incidence by 68%  35-39.9 kg/m2 Severe obesity (Class II) Increased incidence by 136%  >40 kg/m2 Extreme obesity (Class III) Increased incidence by 254%   Patient's current BMI Ideal Body weight  Body mass index is 26 kg/m. Ideal body weight: 68.4 kg (150 lb 12.7 oz) Adjusted ideal body weight: 72.1 kg (158 lb 14 oz)   BMI Readings from Last 4 Encounters:  01/10/24 26.00 kg/m  11/11/23 25.39 kg/m  08/02/23 26.15 kg/m  01/02/23 25.87 kg/m   Wt Readings from Last 4 Encounters:  01/10/24 171 lb (77.6 kg)  11/11/23 171 lb 15.3 oz (78 kg)  08/02/23 172 lb (  78 kg)  01/02/23 175 lb 3.2 oz (79.5 kg)    Psych/Mental status: Alert, oriented x 3 (person, place, & time)       Eyes: PERLA Respiratory: No evidence of acute respiratory distress  Cervical Spine Area Exam  Skin & Axial Inspection: No masses, redness, edema, swelling, or associated skin lesions Alignment: Symmetrical Functional ROM: Unrestricted ROM      Stability: No instability detected Muscle Tone/Strength: Functionally intact. No obvious neuro-muscular anomalies detected. Sensory (Neurological): Unimpaired Palpation: No palpable anomalies             Upper Extremity (UE) Exam    Side: Right upper extremity  Side: Left upper extremity  Skin & Extremity Inspection: Skin color, temperature, and hair growth are WNL. No peripheral edema or cyanosis. No masses, redness, swelling, asymmetry, or associated skin lesions. No contractures.  Skin & Extremity Inspection: Skin color, temperature, and hair growth are WNL. No peripheral edema or cyanosis. No masses,  redness, swelling, asymmetry, or associated skin lesions. No contractures.  Functional ROM: Pain restricted ROM for shoulder and elbow  Functional ROM: Unrestricted ROM          Muscle Tone/Strength: Functionally intact. No obvious neuro-muscular anomalies detected.  Muscle Tone/Strength: Functionally intact. No obvious neuro-muscular anomalies detected.  Sensory (Neurological): Musculoskeletal pain pattern          Sensory (Neurological): Unimpaired          Palpation: No palpable anomalies              Palpation: No palpable anomalies              Provocative Test(s):  Phalen's test: deferred Tinel's test: deferred Apley's scratch test (touch opposite shoulder):  Action 1 (Across chest): Decreased ROM Action 2 (Overhead): Decreased ROM Action 3 (LB reach): Decreased ROM   Provocative Test(s):  Phalen's test: deferred Tinel's test: deferred Apley's scratch test (touch opposite shoulder):  Action 1 (Across chest): deferred Action 2 (Overhead): deferred Action 3 (LB reach): deferred    Thoracic Spine Area Exam  Skin & Axial Inspection: No masses, redness, or swelling Alignment: Symmetrical Functional ROM: Unrestricted ROM Stability: No instability detected Muscle Tone/Strength: Functionally intact. No obvious neuro-muscular anomalies detected. Sensory (Neurological): Unimpaired Muscle strength & Tone: No palpable anomalies Lumbar Spine Area Exam  Skin & Axial Inspection: No masses, redness, or swelling Alignment: Symmetrical Functional ROM: Unrestricted ROM       Stability: No instability detected Muscle Tone/Strength: Functionally intact. No obvious neuro-muscular anomalies detected. Sensory (Neurological): Musculoskeletal pain pattern Palpation: No palpable anomalies        Gait & Posture Assessment  Ambulation: Limited Gait: Significantly limited. Dependent on assistive device to ambulate Posture: Difficulty standing up straight, due to pain   Lower Extremity Exam     Side: Right lower extremity  Side: Left lower extremity  Stability: No instability observed          Stability: No instability observed          Skin & Extremity Inspection: Skin color, temperature, and hair growth are WNL. No peripheral edema or cyanosis. No masses, redness, swelling, asymmetry, or associated skin lesions. No contractures.  Skin & Extremity Inspection: Skin color, temperature, and hair growth are WNL. No peripheral edema or cyanosis. No masses, redness, swelling, asymmetry, or associated skin lesions. No contractures.  Functional ROM: Unrestricted ROM                  Functional ROM: Unrestricted ROM  Muscle Tone/Strength: Functionally intact. No obvious neuro-muscular anomalies detected.  Muscle Tone/Strength: Functionally intact. No obvious neuro-muscular anomalies detected.  Sensory (Neurological): Neurogenic pain pattern        Sensory (Neurological): Neurogenic pain pattern        DTR: Patellar: deferred today Achilles: deferred today Plantar: deferred today  DTR: Patellar: deferred today Achilles: deferred today Plantar: deferred today  Palpation: No palpable anomalies  Palpation: No palpable anomalies    Assessment  Primary Diagnosis & Pertinent Problem List: The primary encounter diagnosis was Bilateral primary osteoarthritis of knee. Diagnoses of Chronic painful diabetic neuropathy (HCC), Bilateral leg pain, Left hip pain, and Chronic right shoulder pain were also pertinent to this visit.  Visit Diagnosis (New problems to examiner): 1. Bilateral primary osteoarthritis of knee   2. Chronic painful diabetic neuropathy (HCC)   3. Bilateral leg pain   4. Left hip pain   5. Chronic right shoulder pain    Plan of Care   Assessment and Plan    Severe osteoarthritis of left hip and knee   Severe osteoarthritis in the left hip and knee is confirmed by previous imaging, causing significant pain. A previous hip injection provided relief for about a  month. Consider repeating injections every three months and consolidating care for these injections at this clinic. Avoid steroids due to potential impact on blood sugar levels. Administer gel injections for knees on the same day as Qutenza foot wrap.  Generalized osteoarthritis   Generalized osteoarthritis affects multiple joints, including the lower back, SI joints, hips, and knees. Pain management includes topical treatments and non-pharmacological methods. Use topical Voltaren gel and apply heat to affected areas. Avoid systemic medications due to potential renal implications.  Peripheral neuropathy   Peripheral neuropathy presents with burning and tingling in the feet. Use Qutenza, a topical treatment for neuropathic pain, suitable for patients with a history of diabetes and does not affect blood sugar levels. Administer Qutenza foot wrap.  End-stage renal disease on dialysis   Chronic end-stage renal disease is managed with hemodialysis. He has been on dialysis for approximately four years, initially attempted peritoneal dialysis without success, and is currently on a Monday, Wednesday, Friday schedule.  Diabetes mellitus   Diabetes mellitus is currently not managed with insulin . Avoid steroids in treatment plans to prevent exacerbation of diabetes due to his impact on blood sugar levels.      Procedure Orders         KNEE INJECTION         NEUROLYSIS     Consider suprascapular nerve block for right shoulder pain Provider-requested follow-up: Return in about 18 days (around 01/28/2024) for Qutenza and B/L Gel injections, in clinic [facility].  Future Appointments  Date Time Provider Department Center  02/04/2024  1:00 PM Cephus Collin, MD [facility] None   I discussed the assessment and treatment plan with the patient. The patient was provided an opportunity to ask questions and all were answered. The patient agreed with the plan and demonstrated an understanding of the instructions.  Patient  advised to call back or seek an in-person evaluation if the symptoms or condition worsens.  Duration of encounter: .  Total time on encounter, as per AMA guidelines included both the face-to-face and non-face-to-face time personally spent by the physician and/or other qualified health care professional(s) on the day of the encounter (includes time in activities that require the physician or other qualified health care professional and does not include time in activities normally performed by clinical  staff). Physician's time may include the following activities when performed: Preparing to see the patient (e.g., pre-charting review of records, searching for previously ordered imaging, lab work, and nerve conduction tests) Review of prior analgesic pharmacotherapies. Reviewing PMP Interpreting ordered tests (e.g., lab work, imaging, nerve conduction tests) Performing post-procedure evaluations, including interpretation of diagnostic procedures Obtaining and/or reviewing separately obtained history Performing a medically appropriate examination and/or evaluation Counseling and educating the patient/family/caregiver Ordering medications, tests, or procedures Referring and communicating with other health care professionals (when not separately reported) Documenting clinical information in the electronic or other health record Independently interpreting results (not separately reported) and communicating results to the patient/ family/caregiver Care coordination (not separately reported)  Note by: Cephus Collin, MD (TTS and AI technology used. I apologize for any typographical errors that were not detected and corrected.) Date: 01/10/2024; Time: 10:41 AM

## 2024-01-10 NOTE — Progress Notes (Signed)
 Safety precautions to be maintained throughout the outpatient stay will include: orient to surroundings, keep bed in low position, maintain call bell within reach at all times, provide assistance with transfer out of bed and ambulation.

## 2024-02-04 ENCOUNTER — Encounter: Payer: Self-pay | Admitting: Student in an Organized Health Care Education/Training Program

## 2024-02-04 ENCOUNTER — Ambulatory Visit
Attending: Student in an Organized Health Care Education/Training Program | Admitting: Student in an Organized Health Care Education/Training Program

## 2024-02-04 VITALS — BP 167/76 | Temp 98.2°F | Ht 68.0 in | Wt 171.0 lb

## 2024-02-04 DIAGNOSIS — M170 Bilateral primary osteoarthritis of knee: Secondary | ICD-10-CM | POA: Diagnosis present

## 2024-02-04 DIAGNOSIS — E1140 Type 2 diabetes mellitus with diabetic neuropathy, unspecified: Secondary | ICD-10-CM | POA: Insufficient documentation

## 2024-02-04 MED ORDER — CAPSAICIN-CLEANSING GEL 8 % EX KIT
4.0000 | PACK | Freq: Once | CUTANEOUS | Status: AC
  Administered 2024-02-04: 4 via TOPICAL
  Filled 2024-02-04: qty 4

## 2024-02-04 MED ORDER — SODIUM HYALURONATE (VISCOSUP) 20 MG/2ML IX SOSY
2.0000 mL | PREFILLED_SYRINGE | Freq: Once | INTRA_ARTICULAR | Status: AC
  Administered 2024-02-04: 20 mg via INTRA_ARTICULAR

## 2024-02-04 MED ORDER — LIDOCAINE HCL 2 % IJ SOLN: 20.0000 mL | Freq: Once | INTRAMUSCULAR | Status: DC

## 2024-02-04 MED ORDER — LIDOCAINE HCL 2 % IJ SOLN
INTRAMUSCULAR | Status: AC
  Filled 2024-02-04: qty 20

## 2024-02-04 NOTE — Progress Notes (Signed)
 PROVIDER NOTE: Interpretation of information contained herein should be left to medically-trained personnel. Specific patient instructions are provided elsewhere under Patient Instructions section of medical record. This document was created in part using STT-dictation technology, any transcriptional errors that may result from this process are unintentional.  Patient: [name] [name] Type: Established DOB: [birth year] MRN: [id number] PCP: Monique Ano, MD  Service: Procedure DOS: 02/04/2024 Setting: Ambulatory Location: Ambulatory outpatient facility Delivery: Face-to-face Provider: Cephus Collin, MD Specialty: Interventional Pain Management Specialty designation: 09 Location: Outpatient facility Ref. Prov.: Monique Ano, MD       Interventional Therapy   Type: Qutenza Neurolysis #1  Laterality:  Bilateral Area treated: Feet Imaging Guidance: None Anesthesia/analgesia/anxiolysis/sedation: None required Medication (Right): Qutenza (capsaicin 8%) topical system Medication (Left): Qutenza (capsaicin 8%) topical system Date: 02/04/2024 Performed by: Cephus Collin, MD Rationale (medical necessity): procedure needed and proper for the treatment of Mr. Enslow's medical symptoms and needs. Indication: Painful diabetic peripheral neuralgia (DPN) (ICD-10-CM:E11.40) severe enough to impact quality of life or function.  NAS-11 Pain score:   Pre-procedure: 10-Worst pain ever/10   Post-procedure: 10-Worst pain ever/10     Position / Prep / Materials:  Position: Supine  Materials: Qutenza Kit (4 patches)  H&P (Pre-op  Assessment):  [name] [name] is a 83 y.o. (year old), male patient, seen today for interventional treatment. He  has a past surgical history that includes Retinal detachment surgery (Left); Bunion Removal (Right); Hernia repair; Transurethral resection of prostate; Appendectomy; Colonoscopy with propofol  (N/A, 06/14/2015); Prostate biopsy (N/A, 03/27/2017); Prostate  biopsy (N/A, 09/11/2019); DIALYSIS/PERMA CATHETER INSERTION (N/A, 10/03/2021); Eye surgery; XI robotic assisted ventral hernia (N/A, 11/23/2021); CAPD insertion (N/A, 11/23/2021); Insertion of mesh (11/23/2021); CAPD revision (N/A, 01/31/2022); CAPD revision (N/A, 04/06/2022); CAPD revision (N/A, 04/14/2022); AV fistula placement (Right, 10/26/2022); UPPER EXTREMITY VENOGRAPHY (Right, 11/20/2022); UPPER EXTREMITY VENOGRAPHY (Right, 11/24/2022); and DIALYSIS/PERMA CATHETER REMOVAL (N/A, 02/12/2023). [name] has a current medication list which includes the following prescription(s): acetaminophen , diclofenac sodium, diphenhydramine , fluticasone , sodium bicarbonate , aspirin ec, cholecalciferol, gabapentin , and polyethylene glycol. His primarily concern today is the Leg Pain (both)  Initial Vital Signs:  Pulse/HCG Rate:    Temp: 98.2 F (36.8 C) Resp:   BP: (!) 167/76 SpO2: 100 %  BMI: Estimated body mass index is 26 kg/m as calculated from the following:   Height as of this encounter: 5' 8 (1.727 m).   Weight as of this encounter: 171 lb (77.6 kg).  Risk Assessment: Allergies: Reviewed. He is allergic to fish allergy, morphine , ciprofloxacin , and doxazosin.  Allergy Precautions: None required Coagulopathies: Reviewed. None identified.  Blood-thinner therapy: None at this time Active Infection(s): Reviewed. None identified. [name] [name] is afebrile  Site Confirmation: [name] [name] was asked to confirm the procedure and laterality before marking the site Procedure checklist: Completed Consent: Before the procedure and under the influence of no sedative(s), amnesic(s), or anxiolytics, the patient was informed of the treatment options, risks and possible complications. To fulfill our ethical and legal obligations, as recommended by the American Medical Association's Code of Ethics, I have informed the patient of my clinical impression; the nature and purpose of the treatment or procedure; the risks, benefits,  and possible complications of the intervention; the alternatives, including doing nothing; the risk(s) and benefit(s) of the alternative treatment(s) or procedure(s); and the risk(s) and benefit(s) of doing nothing. The patient was provided information about the general risks and possible complications associated with the procedure. These may include, but are not limited to: failure to achieve desired goals, infection, bleeding, organ  or nerve damage, allergic reactions, paralysis, and death. In addition, the patient was informed of those risks and complications associated to the procedure, such as failure to decrease pain; infection; bleeding; organ or nerve damage with subsequent damage to sensory, motor, and/or autonomic systems, resulting in permanent pain, numbness, and/or weakness of one or several areas of the body; allergic reactions; (i.e.: anaphylactic reaction); and/or death. Furthermore, the patient was informed of those risks and complications associated with the medications. These include, but are not limited to: allergic reactions (i.e.: anaphylactic or anaphylactoid reaction(s)); adrenal axis suppression; blood sugar elevation that in diabetics may result in ketoacidosis or comma; water retention that in patients with history of congestive heart failure may result in shortness of breath, pulmonary edema, and decompensation with resultant heart failure; weight gain; swelling or edema; medication-induced neural toxicity; particulate matter embolism and blood vessel occlusion with resultant organ, and/or nervous system infarction; and/or aseptic necrosis of one or more joints. Finally, the patient was informed that Medicine is not an exact science; therefore, there is also the possibility of unforeseen or unpredictable risks and/or possible complications that may result in a catastrophic outcome. The patient indicated having understood very clearly. We have given the patient no guarantees and we  have made no promises. Enough time was given to the patient to ask questions, all of which were answered to the patient's satisfaction. [name] [name] has indicated that he wanted to continue with the procedure. Attestation: I, the ordering provider, attest that I have discussed with the patient the benefits, risks, side-effects, alternatives, likelihood of achieving goals, and potential problems during recovery for the procedure that I have provided informed consent. Date  Time: 02/04/2024 12:43 PM  Pre-Procedure Preparation:  Monitoring: As per clinic protocol. Respiration, ETCO2, SpO2, BP, heart rate and rhythm monitor placed and checked for adequate function Safety Precautions: Patient was assessed for positional comfort and pressure points before starting the procedure. Time-out: I initiated and conducted the Time-out before starting the procedure, as per protocol. The patient was asked to participate by confirming the accuracy of the Time Out information. Verification of the correct person, site, and procedure were performed and confirmed by me, the nursing staff, and the patient. Time-out conducted as per Joint Commission's Universal Protocol (UP.01.01.01). Time:  (1422 Bilateral knee injection, sitting on side of bed stop time 1426) Start Time: 1320 hrs.  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  Description of the Procedure: Protocol guidelines were followed. The patient was assisted into a comfortable position.  Informed consent was obtained in the patient monitored in the usual manner.  All questions were answered prior to the procedure.  They Qutenza patches were applied to the affected area and then  covered with the wrap.  The Patient was kept under observation until the treatment was completed.  The patches were removed and the treated area was inspected.  Vitals:   02/04/24 1250  BP: (!) 167/76  Temp: 98.2 F (36.8 C)  SpO2: 100%  Weight: 171 lb (77.6 kg)  Height: 5' 8 (1.727 m)     End Time: 1400 hrs.  Type of Imaging Technique: None used Indication(s): N/A Exposure Time: No patient exposure Contrast: None used. Fluoroscopic Guidance: N/A Ultrasound Guidance: N/A Interpretation: N/A  Post-operative Assessment:  Post-procedure Vital Signs:  Pulse/HCG Rate:    Temp: 98.2 F (36.8 C) Resp:   BP: (!) 167/76 SpO2: 100 %  EBL: None  Complications: No immediate post-treatment complications observed by team, or reported by patient.  Note: The patient tolerated the entire procedure well. A repeat set of vitals were taken after the procedure and the patient was kept under observation following institutional policy, for this type of procedure. Post-procedural neurological assessment was performed, showing return to baseline, prior to discharge. The patient was provided with post-procedure discharge instructions, including a section on how to identify potential problems. Should any problems arise concerning this procedure, the patient was given instructions to immediately contact us , at any time, without hesitation. In any case, we plan to contact the patient by telephone for a follow-up status report regarding this interventional procedure.  Comments:  No additional relevant information.  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    Where will this procedure be performed?:   [facility]     Medications ordered for procedure: Meds ordered this encounter  Medications   capsaicin topical system 8 % patch 4 patch   Sodium Hyaluronate (Viscosup) SOSY 20 mg    Do not substitute. Deliver to facility day before procedure.   Sodium Hyaluronate (Viscosup) SOSY 20 mg    Do not substitute. Deliver to facility day before procedure.   Medications administered: We administered capsaicin topical system, Sodium Hyaluronate (Viscosup), and Sodium Hyaluronate (Viscosup).  See the medical record for exact dosing, route, and time of administration.    Follow-up plan:   Return in about 3 weeks (around 02/25/2024) for B/L hyalgan #2.     Recent Visits Date Type Provider Dept  01/10/24 Office Visit Cephus Collin, MD [facility]  Showing recent visits within past 90 days and meeting all other requirements Today's Visits Date Type Provider Dept  02/04/24 Procedure visit Cephus Collin, MD [facility]  Showing today's visits and meeting all other requirements Future Appointments Date Type Provider Dept  02/26/24 Appointment Cephus Collin, MD [facility]  Showing future appointments within next 90 days and meeting all other requirements   Disposition: Discharge home  Discharge (Date  Time): 02/04/2024; 1429 hrs.   Primary Care Physician: Monique Ano, MD Location: [facility] Note by: Cephus Collin, MD (TTS technology used. I apologize for any typographical errors that were not detected and corrected.) Date: 02/04/2024; Time: 3:24 PM  Disclaimer:  Medicine is not an Visual merchandiser. The only guarantee in medicine is that nothing is  guaranteed. It is important to note that the decision to proceed with this intervention was based on the information collected from the patient. The Data and conclusions were drawn from the patient's questionnaire, the interview, and the physical examination. Because the  information was provided in large part by the patient, it cannot be guaranteed that it has not been purposely or unconsciously manipulated. Every effort has been made to obtain as much relevant data as possible for this evaluation. It is important to note that the conclusions that lead to this procedure are derived in large part from the available data. Always take into account that the treatment will also be dependent on availability of resources and existing treatment guidelines, considered by other Pain Management Practitioners as being common knowledge and practice, at the time of the intervention. For Medico-Legal purposes, it is also important to point out that variation in procedural techniques and pharmacological choices are the acceptable norm. The indications, contraindications, technique, and results of the above procedure should only be interpreted and judged by a Board-Certified Interventional Pain Specialist with extensive familiarity and expertise in the same exact procedure and technique.

## 2024-02-04 NOTE — Progress Notes (Signed)
 PROVIDER NOTE: Interpretation of information contained herein should be left to medically-trained personnel. Specific patient instructions are provided elsewhere under Patient Instructions section of medical record. This document was created in part using STT-dictation technology, any transcriptional errors that may result from this process are unintentional.  Patient: [name] [name] Type: Established DOB: 25-May-1941 MRN: [id number] PCP: [name] Ano, MD  Service: Procedure DOS: 02/04/2024 Setting: Ambulatory Location: Ambulatory outpatient facility Delivery: Face-to-face Provider: Cephus Collin, MD Specialty: Interventional Pain Management Specialty designation: 09 Location: Outpatient facility Ref. Prov.: [name] Ano, MD       Interventional Therapy   Type:  Hyalgan Intra-articular Knee Injection #1  Laterality: Bilateral (-50) Level/approach: Medial Imaging guidance: None required ([id number]) Anesthesia: Local anesthesia (1-2% Lidocaine ) DOS: 02/04/2024  Performed by: [name] Collin, MD  Purpose: Diagnostic/Therapeutic Indications: Knee arthralgia associated to osteoarthritis of the knee 1. Chronic painful diabetic neuropathy (HCC)   2. Bilateral primary osteoarthritis of knee    NAS-11 score:   Pre-procedure: 10-Worst pain ever/10   Post-procedure: 10-Worst pain ever/10     Pre-Procedure Preparation  Monitoring: As per clinic protocol.  Risk Assessment: Vitals:  [id number] body mass index is 26 kg/m as calculated from the following:   Height as of this encounter: 5' 8 (1.727 m).   Weight as of this encounter: 171 lb (77.6 kg)., Rate:  , BP:(!) 167/76, Resp: , Temp:98.2 F (36.8 C), SpO2:100 %  Allergies: He is allergic to fish allergy, morphine , ciprofloxacin , and doxazosin.  Precautions: No additional precautions required  Blood-thinner(s): None at this time  Coagulopathies: Reviewed. None identified.   Active Infection(s): Reviewed. None identified.  [name] [name] is afebrile   Location setting: Exam room Position: Sitting w/ knee bent 90 degrees Safety Precautions: Patient was assessed for positional comfort and pressure points before starting the procedure. Prepping solution: DuraPrep (Iodine  Povacrylex [0.7% available iodine ] and Isopropyl Alcohol, 74% w/w) Prep Area: Entire knee region Approach: percutaneous, just above the tibial plateau, lateral to the infrapatellar tendon. Intended target: Intra-articular knee space Materials: Tray: Block Needle(s): Regular Qty: 1/side Length: 1.5-inch Gauge: 25G (x1) + 22G (x1)  Meds ordered this encounter  Medications   capsaicin topical system 8 % patch 4 patch   Sodium Hyaluronate (Viscosup) SOSY 20 mg    Do not substitute. Deliver to facility day before procedure.   Sodium Hyaluronate (Viscosup) SOSY 20 mg    Do not substitute. Deliver to facility day before procedure.    Orders Placed This Encounter  Procedures   KNEE INJECTION    Indications: Knee arthralgia (pain) due to osteoarthritis (OA) Imaging: None (CPT-20610) Position: Sitting Equipment/Materials: Block tray  1.5, 25-G (one per side)  Local anesthetic  Hyalgan (one per side)    Standing Status:   Future    Expected Date:   02/27/2024    Expiration Date:   02/03/2025    Scheduling Instructions:     Procedure: Knee injection Hyalgan (Hyaluronan/Hyaluronic acid)     Treatment No.: 2           Level: Intra-articular     Laterality: Bilateral Knee     Sedation: Patient's choice.    Where will this procedure be performed?:   [facility]     Time-out:  (1422 Bilateral knee injection, sitting on side of bed stop time 1426) I initiated and conducted the Time-out before starting the procedure, as per protocol. The patient was asked to participate by confirming the accuracy of the Time Out information. Verification of the correct person, site,  and procedure were performed and confirmed by me, the nursing staff,  and the patient. Time-out conducted as per Joint Commission's Universal Protocol (UP.01.01.01). Procedure checklist: Completed  H&P (Pre-op   Assessment)  [name] [name] is a 83 y.o. (year old), male patient, seen today for interventional treatment. He  has a past surgical history that includes Retinal detachment surgery (Left); Bunion Removal (Right); Hernia repair; Transurethral resection of prostate; Appendectomy; Colonoscopy with propofol  (N/A, 06/14/2015); Prostate biopsy (N/A, 03/27/2017); Prostate biopsy (N/A, 09/11/2019); DIALYSIS/PERMA CATHETER INSERTION (N/A, 10/03/2021); Eye surgery; XI robotic assisted ventral hernia (N/A, 11/23/2021); CAPD insertion (N/A, 11/23/2021); Insertion of mesh (11/23/2021); CAPD revision (N/A, 01/31/2022); CAPD revision (N/A, 04/06/2022); CAPD revision (N/A, 04/14/2022); AV fistula placement (Right, 10/26/2022); UPPER EXTREMITY VENOGRAPHY (Right, 11/20/2022); UPPER EXTREMITY VENOGRAPHY (Right, 11/24/2022); and DIALYSIS/PERMA CATHETER REMOVAL (N/A, 02/12/2023). [name] [name] has a current medication list which includes the following prescription(s): acetaminophen , diclofenac sodium, diphenhydramine , fluticasone , sodium bicarbonate , aspirin ec, cholecalciferol, gabapentin , and polyethylene glycol. His primarily concern today is the Leg Pain (both)  He is allergic to fish allergy, morphine , ciprofloxacin , and doxazosin.   Last encounter: My last encounter with him was on 01/10/2024. Pertinent problems: [name] [name] does not have any pertinent problems on file. Pain Assessment: Severity of Chronic pain is reported as a 10-Worst pain ever/10. Location: Leg Left, Right/pain radiaties down both leg. Onset: More than a month ago. Quality: Aching, Burning, Tingling, Discomfort, Constant. Timing: Constant. Modifying factor(s): sitting down. Vitals:  height is 5' 8 (1.727 m) and weight is 171 lb (77.6 kg). His temperature is 98.2 F (36.8 C). His blood pressure is 167/76 (abnormal). His oxygen  saturation is 100%.   Reason for encounter: interventional pain management therapy due pain of at least four (4) weeks in duration, with failure to respond and/or inability to tolerate more conservative care.  Site Confirmation: Mr. Lightsey was asked to confirm the procedure and laterality before marking the site.  Consent: Before the procedure and under the influence of no sedative(s), amnesic(s), or anxiolytics, the patient was informed of the treatment options, risks and possible complications. To fulfill our ethical and legal obligations, as recommended by the American Medical Association's Code of Ethics, I have informed the patient of my clinical impression; the nature and purpose of the treatment or procedure; the risks, benefits, and possible complications of the intervention; the alternatives, including doing nothing; the risk(s) and benefit(s) of the alternative treatment(s) or procedure(s); and the risk(s) and benefit(s) of doing nothing. The patient was provided information about the general risks and possible complications associated with the procedure. These may include, but are not limited to: failure to achieve desired goals, infection, bleeding, organ or nerve damage, allergic reactions, paralysis, and death. In addition, the patient was informed of those risks and complications associated to Spine-related procedures, such as failure to decrease pain; infection (i.e.: Meningitis, epidural or intraspinal abscess); bleeding (i.e.: epidural hematoma, subarachnoid hemorrhage, or any other type of intraspinal or peri-dural bleeding); organ or nerve damage (i.e.: Any type of peripheral nerve, nerve root, or spinal cord injury) with subsequent damage to sensory, motor, and/or autonomic systems, resulting in permanent pain, numbness, and/or weakness of one or several areas of the body; allergic reactions; (i.e.: anaphylactic reaction); and/or death. Furthermore, the patient was informed of those  risks and complications associated with the medications. These include, but are not limited to: allergic reactions (i.e.: anaphylactic or anaphylactoid reaction(s)); adrenal axis suppression; blood sugar elevation that in diabetics may result in ketoacidosis or comma; water retention that  in patients with history of congestive heart failure may result in shortness of breath, pulmonary edema, and decompensation with resultant heart failure; weight gain; swelling or edema; medication-induced neural toxicity; particulate matter embolism and blood vessel occlusion with resultant organ, and/or nervous system infarction; and/or aseptic necrosis of one or more joints. Finally, the patient was informed that Medicine is not an exact science; therefore, there is also the possibility of unforeseen or unpredictable risks and/or possible complications that may result in a catastrophic outcome. The patient indicated having understood very clearly. We have given the patient no guarantees and we have made no promises. Enough time was given to the patient to ask questions, all of which were answered to the patient's satisfaction. Mr. Speros has indicated that he wanted to continue with the procedure. Attestation: I, the ordering provider, attest that I have discussed with the patient the benefits, risks, side-effects, alternatives, likelihood of achieving goals, and potential problems during recovery for the procedure that I have provided informed consent.  Date  Time: 02/04/2024 12:43 PM  Description of procedure  Start Time: 1320 hrs  Local Anesthesia: Once the patient was positioned, prepped, and time-out was completed. The target area was identified located. The skin was marked with an approved surgical skin marker. Once marked, the skin (epidermis, dermis, and hypodermis), and deeper tissues (fat, connective tissue and muscle) were infiltrated with a small amount of a short-acting local anesthetic, loaded on a 10cc  syringe with a 25G, 1.5-in  Needle. An appropriate amount of time was allowed for local anesthetics to take effect before proceeding to the next step. Local Anesthetic: Lidocaine  1-2% The unused portion of the local anesthetic was discarded in the proper designated containers. Safety Precautions: Aspiration looking for blood return was conducted prior to all injections. At no point did I inject any substances, as a needle was being advanced. Before injecting, the patient was told to immediately notify me if he was experiencing any new onset of ringing in the ears, or metallic taste in the mouth. No attempts were made at seeking any paresthesias. Safe injection practices and needle disposal techniques used. Medications properly checked for expiration dates. SDV (single dose vial) medications used. After the completion of the procedure, all disposable equipment used was discarded in the proper designated medical waste containers.  Technical description: Protocol guidelines were followed. After positioning, the target area was identified and prepped in the usual manner. Skin & deeper tissues infiltrated with local anesthetic. Appropriate amount of time allowed to pass for local anesthetics to take effect. Proper needle placement secured. Once satisfactory needle placement was confirmed, I proceeded to inject the desired solution in slow, incremental fashion, intermittently assessing for discomfort or any signs of abnormal or undesired spread of substance. Once completed, the needle was removed and disposed of, as per hospital protocols. The area was cleaned, making sure to leave some of the prepping solution back to take advantage of its long term bactericidal properties.  Aspiration:  Negative        Vitals:   02/04/24 1250  BP: (!) 167/76  Temp: 98.2 F (36.8 C)  SpO2: 100%  Weight: 171 lb (77.6 kg)  Height: 5' 8 (1.727 m)    End Time: 1400 hrs  Imaging guidance  Imaging-assisted  Technique: None required. Indication(s): N/A Exposure Time: N/A Contrast: None Fluoroscopic Guidance: N/A Ultrasound Guidance: N/A Interpretation: N/A  Post-op assessment  Post-procedure Vital Signs:  Pulse/HCG Rate:    Temp: 98.2 F (36.8 C) Resp:  BP: (!) 167/76 SpO2: 100 %  EBL: None  Complications: No immediate post-treatment complications observed by team, or reported by patient.  Note: The patient tolerated the entire procedure well. A repeat set of vitals were taken after the procedure and the patient was kept under observation following institutional policy, for this type of procedure. Post-procedural neurological assessment was performed, showing return to baseline, prior to discharge. The patient was provided with post-procedure discharge instructions, including a section on how to identify potential problems. Should any problems arise concerning this procedure, the patient was given instructions to immediately contact us , at any time, without hesitation. In any case, we plan to contact the patient by telephone for a follow-up status report regarding this interventional procedure.  Comments:  No additional relevant information.   Medications administered: We administered capsaicin topical system, Sodium Hyaluronate (Viscosup), and Sodium Hyaluronate (Viscosup).   Follow-up plan:   Return in about 3 weeks (around 02/25/2024) for B/L hyalgan #2.     Recent Visits Date Type Provider Dept  01/10/24 Office Visit [name] Collin, MD [facility]  Showing recent visits within past 90 days and meeting all other requirements Today's Visits Date Type Provider Dept  02/04/24 Procedure visit [name] Collin, MD [facility]  Showing today's visits and meeting all other requirements Future Appointments Date Type Provider Dept  02/26/24 Appointment [name] Collin, MD [facility]  Showing future appointments within next 90 days and meeting all other  requirements   Disposition: Discharge home  Discharge (Date  Time): 02/04/2024; 1429 hrs.   Primary Care Physician: [name] Ano, MD Location: [facility] Note by: [name] Collin, MD Date: 02/04/2024; Time: 3:24 PM  DISCLAIMER: Medicine is not an exact science. It has no guarantees or warranties. The decision to proceed with this intervention was based on the information collected from the patient. Conclusions were drawn from the patient's questionnaire, interview, and examination. Because information was provided in large part by the patient, it cannot be guaranteed that it has not been purposely or unconsciously manipulated or altered. Every effort has been made to obtain as much accurate, relevant, available data as possible. Always take into account that the treatment will also be dependent on availability of resources and existing treatment guidelines, considered by other Pain Management Specialists as being common knowledge and practice, at the time of the intervention. It is also important to point out that variation in procedural techniques and pharmacological choices are the acceptable norm. For Medico-Legal review purposes, the indications, contraindications, technique, and results of the these procedures should only be evaluated, judged and interpreted by a Board-Certified Interventional Pain Specialist with extensive familiarity and expertise in the same exact procedure and technique.

## 2024-02-04 NOTE — Patient Instructions (Signed)
 Qutenza chart given to daughter to review while waiting on father.  Capsaicin Topical System ______________________________________________________________________    Post-Procedure Discharge Instructions  Instructions: Apply ice:  Purpose: This will minimize any swelling and discomfort after procedure.  When: Day of procedure, as soon as you get home. How: Fill a plastic sandwich bag with crushed ice. Cover it with a small towel and apply to injection site. How long: (15 min on, 15 min off) Apply for 15 minutes then remove x 15 minutes.  Repeat sequence on day of procedure, until you go to bed. Apply heat:  Purpose: To treat any soreness and discomfort from the procedure. When: Starting the next day after the procedure. How: Apply heat to procedure site starting the day following the procedure. How long: May continue to repeat daily, until discomfort goes away. Food intake: Start with clear liquids (like water) and advance to regular food, as tolerated.  Physical activities: Keep activities to a minimum for the first 8 hours after the procedure. After that, then as tolerated. Driving: If you have received any sedation, be responsible and do not drive. You are not allowed to drive for 24 hours after having sedation. Blood thinner: (Applies only to those taking blood thinners) You may restart your blood thinner 6 hours after your procedure. Insulin : (Applies only to Diabetic patients taking insulin ) As soon as you can eat, you may resume your normal dosing schedule. Infection prevention: Keep procedure site clean and dry. Shower daily and clean area with soap and water. Post-procedure Pain Diary: Extremely important that this be done correctly and accurately. Recorded information will be used to determine the next step in treatment. For the purpose of accuracy, follow these rules: Evaluate only the area treated. Do not report or include pain from an untreated area. For the purpose of this  evaluation, ignore all other areas of pain, except for the treated area. After your procedure, avoid taking a long nap and attempting to complete the pain diary after you wake up. Instead, set your alarm clock to go off every hour, on the hour, for the initial 8 hours after the procedure. Document the duration of the numbing medicine, and the relief you are getting from it. Do not go to sleep and attempt to complete it later. It will not be accurate. If you received sedation, it is likely that you were given a medication that may cause amnesia. Because of this, completing the diary at a later time may cause the information to be inaccurate. This information is needed to plan your care. Follow-up appointment: Keep your post-procedure follow-up evaluation appointment after the procedure (usually 2 weeks for most procedures, 6 weeks for radiofrequencies). DO NOT FORGET to bring you pain diary with you.   Expect: (What should I expect to see with my procedure?) From numbing medicine (AKA: Local Anesthetics): Numbness or decrease in pain. You may also experience some weakness, which if present, could last for the duration of the local anesthetic. Onset: Full effect within 15 minutes of injected. Duration: It will depend on the type of local anesthetic used. On the average, 1 to 8 hours.  From steroids (Applies only if steroids were used): Decrease in swelling or inflammation. Once inflammation is improved, relief of the pain will follow. Onset of benefits: Depends on the amount of swelling present. The more swelling, the longer it will take for the benefits to be seen. In some cases, up to 10 days. Duration: Steroids will stay in the system x 2  weeks. Duration of benefits will depend on multiple posibilities including persistent irritating factors. Side-effects: If present, they may typically last 2 weeks (the duration of the steroids). Frequent: Cramps (if they occur, drink Gatorade and take  over-the-counter Magnesium  450-500 mg once to twice a day); water retention with temporary weight gain; increases in blood sugar; decreased immune system response; increased appetite. Occasional: Facial flushing (red, warm cheeks); mood swings; menstrual changes. Uncommon: Long-term decrease or suppression of natural hormones; bone thinning. (These are more common with higher doses or more frequent use. This is why we prefer that our patients avoid having any injection therapies in other practices.)  Very Rare: Severe mood changes; psychosis; aseptic necrosis. From procedure: Some discomfort is to be expected once the numbing medicine wears off. This should be minimal if ice and heat are applied as instructed.  Call if: (When should I call?) You experience numbness and weakness that gets worse with time, as opposed to wearing off. New onset bowel or bladder incontinence. (Applies only to procedures done in the spine)  Emergency Numbers: Durning business hours (Monday - Thursday, 8:00 AM - 4:00 PM) (Friday, 9:00 AM - 12:00 Noon): [phone] After hours: [phone] NOTE: If you are having a problem and are unable connect with, or to talk to a provider, then go to your nearest urgent care or emergency department. If the problem is serious and urgent, please call 911. ______________________________________________________________________     What is this medication? CAPSAICIN (cap SAY sin) treats nerve pain. It works by making your skin feel warm or cool, which blocks pain signals going to the brain. This medicine may be used for other purposes; ask your health care provider or pharmacist if you have questions. COMMON BRAND NAME(S): Qutenza What should I tell my care team before I take this medication? They need to know if you have any of these conditions: Have had a heart attack or stroke High blood pressure Large areas of burned or damaged skin An unusual or allergic reaction to  capsaicin, hot peppers, other medications, foods, dyes, or preservatives Pregnant or trying to get pregnant Breastfeeding How should I use this medication? This medication is for external use only. It is applied by your care team in a hospital or clinic setting. Talk to your care team about the use of this medication in children. Special care may be needed. Overdosage: If you think you have taken too much of this medicine contact a poison control center or emergency room at once. NOTE: This medicine is only for you. Do not share this medicine with others. What if I miss a dose? This does not apply. What may interact with this medication? Interactions are not expected. Do not use any other skin products on the affected area without asking your care team. This list may not describe all possible interactions. Give your health care provider a list of all the medicines, herbs, non-prescription drugs, or dietary supplements you use. Also tell them if you smoke, drink alcohol, or use illegal drugs. Some items may interact with your medicine. What should I watch for while using this medication? Your condition will be monitored carefully while you are receiving this medication. Tell your care team if your symptoms do not start to get better or if they get worse. Talk to your care team about how to treat discomfort. You may place a cooling pack from the refrigerator (not the freezer) on the area. Do not place it directly on the skin. Try not  to touch the area where the patch was applied. If you do, wash your hands with soap and water right away. Your skin may be sensitive to heat for a few days after treatment. Avoid hot baths or showers, heating pads, and direct sunlight on the treated area. What side effects may I notice from receiving this medication? Side effects that you should report to your care team as soon as possible: Allergic reactions--skin rash, itching, hives, swelling of the face, lips,  tongue, or throat Burning, itching, crusting, or peeling of treated skin Increase in blood pressure Numbness, decrease in sense of touch or sensation Side effects that usually do not require medical attention (report these to your care team if they continue or are bothersome): Mild skin irritation, redness, or dryness This list may not describe all possible side effects. Call your doctor for medical advice about side effects. You may report side effects to FDA at 1-800-FDA-1088. Where should I keep my medication? This medication is given in a hospital or clinic. It will not be stored at home. NOTE: This sheet is a summary. It may not cover all possible information. If you have questions about this medicine, talk to your doctor, pharmacist, or health care provider.  2024 Elsevier/Gold Standard (2023-07-20 00:00:00)

## 2024-02-04 NOTE — Progress Notes (Signed)
 Safety precautions to be maintained throughout the outpatient stay will include: orient to surroundings, keep bed in low position, maintain call bell within reach at all times, provide assistance with transfer out of bed and ambulation.

## 2024-02-05 ENCOUNTER — Telehealth: Payer: Self-pay | Admitting: *Deleted

## 2024-02-05 NOTE — Telephone Encounter (Signed)
Called for post procedure check. No answer. LVM. 

## 2024-02-26 ENCOUNTER — Ambulatory Visit (HOSPITAL_BASED_OUTPATIENT_CLINIC_OR_DEPARTMENT_OTHER): Admitting: Student in an Organized Health Care Education/Training Program

## 2024-02-26 ENCOUNTER — Ambulatory Visit
Admission: RE | Admit: 2024-02-26 | Discharge: 2024-02-26 | Disposition: A | Discharge status: Home / Self Care | Source: Ambulatory Visit | Attending: Student in an Organized Health Care Education/Training Program | Admitting: Student in an Organized Health Care Education/Training Program

## 2024-02-26 VITALS — BP 158/81 | HR 70 | Temp 97.7°F | Resp 20 | Ht 68.0 in | Wt 171.0 lb

## 2024-02-26 DIAGNOSIS — E1140 Type 2 diabetes mellitus with diabetic neuropathy, unspecified: Secondary | ICD-10-CM | POA: Insufficient documentation

## 2024-02-26 DIAGNOSIS — M5416 Radiculopathy, lumbar region: Secondary | ICD-10-CM

## 2024-02-26 DIAGNOSIS — M170 Bilateral primary osteoarthritis of knee: Secondary | ICD-10-CM | POA: Diagnosis present

## 2024-02-26 MED ORDER — LIDOCAINE HCL 2 % IJ SOLN
20.0000 mL | Freq: Once | INTRAMUSCULAR | Status: AC
  Administered 2024-02-26: 400 mg
  Filled 2024-02-26: qty 20

## 2024-02-26 MED ORDER — SODIUM CHLORIDE 0.9% FLUSH
2.0000 mL | Freq: Once | INTRAVENOUS | Status: AC
  Administered 2024-02-26: 10 mL

## 2024-02-26 MED ORDER — SODIUM HYALURONATE (VISCOSUP) 20 MG/2ML IX SOSY
2.0000 mL | PREFILLED_SYRINGE | Freq: Once | INTRA_ARTICULAR | Status: AC
  Administered 2024-02-26: 20 mg via INTRA_ARTICULAR

## 2024-02-26 MED ORDER — ROPIVACAINE HCL 2 MG/ML IJ SOLN
2.0000 mL | Freq: Once | INTRAMUSCULAR | Status: AC
  Administered 2024-02-26: 20 mL via EPIDURAL
  Filled 2024-02-26: qty 20

## 2024-02-26 MED ORDER — DEXAMETHASONE SODIUM PHOSPHATE 10 MG/ML IJ SOLN
10.0000 mg | Freq: Once | INTRAMUSCULAR | Status: AC
  Administered 2024-02-26: 10 mg
  Filled 2024-02-26: qty 1

## 2024-02-26 MED ORDER — IOHEXOL 180 MG/ML  SOLN
10.0000 mL | Freq: Once | INTRAMUSCULAR | Status: AC
  Administered 2024-02-26: 10 mL via EPIDURAL
  Filled 2024-02-26: qty 20

## 2024-02-26 MED ORDER — IOHEXOL 180 MG/ML  SOLN
INTRAMUSCULAR | Status: AC
  Filled 2024-02-26: qty 20

## 2024-02-26 MED ORDER — LIDOCAINE HCL 2 % IJ SOLN
20.0000 mL | Freq: Once | INTRAMUSCULAR | Status: AC
  Administered 2024-02-26: 200 mg
  Filled 2024-02-26: qty 20

## 2024-02-26 NOTE — Progress Notes (Signed)
 Safety precautions to be maintained throughout the outpatient stay will include: orient to surroundings, keep bed in low position, maintain call bell within reach at all times, provide assistance with transfer out of bed and ambulation.

## 2024-02-26 NOTE — Progress Notes (Signed)
 PROVIDER NOTE: Interpretation of information contained herein should be left to medically-trained personnel. Specific patient instructions are provided elsewhere under Patient Instructions section of medical record. This document was created in part using STT-dictation technology, any transcriptional errors that may result from this process are unintentional.  Patient: [name] [name] Type: Established DOB: 1941/02/10 MRN: [id number] PCP: Alla Amis, MD  Service: Procedure DOS: 02/26/2024 Setting: Ambulatory Location: Ambulatory outpatient facility Delivery: Face-to-face Provider: Wallie Sherry, MD Specialty: Interventional Pain Management Specialty designation: 09 Location: Outpatient facility Ref. Prov.: Alla Amis, MD       Interventional Therapy   Type:  Hyalgan Intra-articular Knee Injection #2  Laterality: Bilateral (-50) Level/approach: Medial Imaging guidance: None required ([id number]) Anesthesia: Local anesthesia (1-2% Lidocaine ) DOS: 02/26/2024  Performed by: Wallie Sherry, MD  Purpose: Diagnostic/Therapeutic Indications: Knee arthralgia associated to osteoarthritis of the knee 1. Bilateral primary osteoarthritis of knee   2. Lumbar radicular pain   3. Chronic painful diabetic neuropathy (HCC)    NAS-11 score:   Pre-procedure: 8 /10   Post-procedure: 0-No pain/10     Pre-Procedure Preparation  Monitoring: As per clinic protocol.  Risk Assessment: Vitals:  AFP:Zdupfjuzi body mass index is 26 kg/m as calculated from the following:   Height as of this encounter: 5' 8 (1.727 m).   Weight as of this encounter: 171 lb (77.6 kg)., Rate:70ECG Heart Rate: 69, BP:(!) 158/81, Resp:16, Temp:97.7 F (36.5 C), SpO2:100 %  Allergies: He is allergic to fish allergy, morphine , ciprofloxacin , and doxazosin.  Precautions: No additional precautions required  Blood-thinner(s): None at this time  Coagulopathies: Reviewed. None identified.   Active Infection(s):  Reviewed. None identified. [name] [name] is afebrile   Location setting: Exam room Position: Sitting w/ knee bent 90 degrees Safety Precautions: Patient was assessed for positional comfort and pressure points before starting the procedure. Prepping solution: DuraPrep (Iodine  Povacrylex [0.7% available iodine ] and Isopropyl Alcohol, 74% w/w) Prep Area: Entire knee region Approach: percutaneous, just above the tibial plateau, lateral to the infrapatellar tendon. Intended target: Intra-articular knee space Materials: Tray: Block Needle(s): Regular Qty: 1/side Length: 1.5-inch Gauge: 25G (x1) + 22G (x1)  Meds ordered this encounter  Medications   Sodium Hyaluronate (Viscosup) SOSY 20 mg    Do not substitute. Deliver to facility day before procedure.   Sodium Hyaluronate (Viscosup) SOSY 20 mg    Do not substitute. Deliver to facility day before procedure.   lidocaine  (XYLOCAINE ) 2 % (with pres) injection 400 mg   iohexol  (OMNIPAQUE ) 180 MG/ML injection 10 mL    Must be Myelogram-compatible. If not available, you may substitute with a water-soluble, non-ionic, hypoallergenic, myelogram-compatible radiological contrast medium.   lidocaine  (XYLOCAINE ) 2 % (with pres) injection 400 mg   ropivacaine  (PF) 2 mg/mL (0.2%) (NAROPIN ) injection 2 mL   sodium chloride  flush (NS) 0.9 % injection 2 mL   dexamethasone  (DECADRON ) injection 10 mg    Orders Placed This Encounter  Procedures   NEUROLYSIS    Please order Qutenza  patches    Standing Status:   Future    Expected Date:   05/07/2024    Expiration Date:   02/25/2025    Where will this procedure be performed?:   [facility]   Lumbar Epidural Injection    Scheduling Instructions:     Procedure: Interlaminar LESI TBD     Laterality: TBD     Sedation: Patient's choice     Timeframe: Today    Where will this procedure be performed?:   [facility]  KNEE INJECTION    Indications: Knee arthralgia (pain) due to osteoarthritis  (OA) Imaging: None (CPT-20610) Position: Sitting Equipment/Materials: Block tray  1.5, 25-G (one per side)  Local anesthetic  Hyalgan (one per side)    Standing Status:   Future    Expected Date:   05/07/2024    Expiration Date:   02/25/2025    Scheduling Instructions:     Procedure: Knee injection Hyalgan (Hyaluronan/Hyaluronic acid)     Treatment No.:3            Level: Intra-articular     Laterality: Bilateral Knee     Sedation: Patient's choice.    Where will this procedure be performed?:   [facility]   DG PAIN [facility] C-ARM 1-60 MIN NO REPORT    Intraoperative interpretation by procedural physician at [facility].    Standing Status:   Standing    Number of Occurrences:   1    Reason for exam::   Assistance in needle guidance and placement for procedures requiring needle placement in or near specific anatomical locations not easily accessible without such assistance.     Time-out: 1026 I initiated and conducted the Time-out before starting the procedure, as per protocol. The patient was asked to participate by confirming the accuracy of the Time Out information. Verification of the correct person, site, and procedure were performed and confirmed by me, the nursing staff, and the patient. Time-out conducted as per Joint Commission's Universal Protocol (UP.01.01.01). Procedure checklist: Completed  H&P (Pre-op   Assessment)  [name] [name] is a 83 y.o. (year old), male patient, seen today for interventional treatment. He  has a past surgical history that includes Retinal detachment surgery (Left); Bunion Removal (Right); Hernia repair; Transurethral resection of prostate; Appendectomy; Colonoscopy with propofol  (N/A, 06/14/2015); Prostate biopsy (N/A, 03/27/2017); Prostate biopsy (N/A, 09/11/2019); DIALYSIS/PERMA CATHETER INSERTION (N/A, 10/03/2021); Eye surgery; XI robotic assisted ventral hernia (N/A, 11/23/2021); CAPD insertion (N/A, 11/23/2021); Insertion of mesh  (11/23/2021); CAPD revision (N/A, 01/31/2022); CAPD revision (N/A, 04/06/2022); CAPD revision (N/A, 04/14/2022); AV fistula placement (Right, 10/26/2022); UPPER EXTREMITY VENOGRAPHY (Right, 11/20/2022); UPPER EXTREMITY VENOGRAPHY (Right, 11/24/2022); and DIALYSIS/PERMA CATHETER REMOVAL (N/A, 02/12/2023). Mr. Lizarraga has a current medication list which includes the following prescription(s): acetaminophen , aspirin ec, cholecalciferol, diclofenac sodium, diphenhydramine , polyethylene glycol, sodium bicarbonate , fluticasone , and gabapentin . His primarily concern today is the Knee Pain (Bilateral  ) and Hip Pain (Left )  He is allergic to fish allergy, morphine , ciprofloxacin , and doxazosin.   Last encounter: My last encounter with him was on 02/04/2024. Pertinent problems: Mr. Custer does not have any pertinent problems on file. Pain Assessment: Severity of Chronic pain is reported as a 8 /10. Location: Knee Left, Right/knees to the feet. Onset: More than a month ago. Quality: Aching, Burning. Timing: Intermittent. Modifying factor(s): rest and staying off his feet. Vitals:  height is 5' 8 (1.727 m) and weight is 171 lb (77.6 kg). His temporal temperature is 97.7 F (36.5 C). His blood pressure is 158/81 (abnormal) and his pulse is 70. His respiration is 20 and oxygen saturation is 100%.   Reason for encounter: interventional pain management therapy due pain of at least four (4) weeks in duration, with failure to respond and/or inability to tolerate more conservative care.  Site Confirmation: Mr. Levingston was asked to confirm the procedure and laterality before marking the site.  Consent: Before the procedure and under the influence of no sedative(s), amnesic(s), or anxiolytics, the patient was informed of the treatment options, risks  and possible complications. To fulfill our ethical and legal obligations, as recommended by the American Medical Association's Code of Ethics, I have informed the patient of my  clinical impression; the nature and purpose of the treatment or procedure; the risks, benefits, and possible complications of the intervention; the alternatives, including doing nothing; the risk(s) and benefit(s) of the alternative treatment(s) or procedure(s); and the risk(s) and benefit(s) of doing nothing. The patient was provided information about the general risks and possible complications associated with the procedure. These may include, but are not limited to: failure to achieve desired goals, infection, bleeding, organ or nerve damage, allergic reactions, paralysis, and death. In addition, the patient was informed of those risks and complications associated to Spine-related procedures, such as failure to decrease pain; infection (i.e.: Meningitis, epidural or intraspinal abscess); bleeding (i.e.: epidural hematoma, subarachnoid hemorrhage, or any other type of intraspinal or peri-dural bleeding); organ or nerve damage (i.e.: Any type of peripheral nerve, nerve root, or spinal cord injury) with subsequent damage to sensory, motor, and/or autonomic systems, resulting in permanent pain, numbness, and/or weakness of one or several areas of the body; allergic reactions; (i.e.: anaphylactic reaction); and/or death. Furthermore, the patient was informed of those risks and complications associated with the medications. These include, but are not limited to: allergic reactions (i.e.: anaphylactic or anaphylactoid reaction(s)); adrenal axis suppression; blood sugar elevation that in diabetics may result in ketoacidosis or comma; water retention that in patients with history of congestive heart failure may result in shortness of breath, pulmonary edema, and decompensation with resultant heart failure; weight gain; swelling or edema; medication-induced neural toxicity; particulate matter embolism and blood vessel occlusion with resultant organ, and/or nervous system infarction; and/or aseptic necrosis of one or  more joints. Finally, the patient was informed that Medicine is not an exact science; therefore, there is also the possibility of unforeseen or unpredictable risks and/or possible complications that may result in a catastrophic outcome. The patient indicated having understood very clearly. We have given the patient no guarantees and we have made no promises. Enough time was given to the patient to ask questions, all of which were answered to the patient's satisfaction. Mr. Colley has indicated that he wanted to continue with the procedure. Attestation: I, the ordering provider, attest that I have discussed with the patient the benefits, risks, side-effects, alternatives, likelihood of achieving goals, and potential problems during recovery for the procedure that I have provided informed consent.  Date  Time: 02/26/2024  9:46 AM  Description of procedure  Start Time: 1027 hrs  Local Anesthesia: Once the patient was positioned, prepped, and time-out was completed. The target area was identified located. The skin was marked with an approved surgical skin marker. Once marked, the skin (epidermis, dermis, and hypodermis), and deeper tissues (fat, connective tissue and muscle) were infiltrated with a small amount of a short-acting local anesthetic, loaded on a 10cc syringe with a 25G, 1.5-in  Needle. An appropriate amount of time was allowed for local anesthetics to take effect before proceeding to the next step. Local Anesthetic: Lidocaine  1-2% The unused portion of the local anesthetic was discarded in the proper designated containers. Safety Precautions: Aspiration looking for blood return was conducted prior to all injections. At no point did I inject any substances, as a needle was being advanced. Before injecting, the patient was told to immediately notify me if he was experiencing any new onset of ringing in the ears, or metallic taste in the mouth. No attempts were made  at seeking any paresthesias.  Safe injection practices and needle disposal techniques used. Medications properly checked for expiration dates. SDV (single dose vial) medications used. After the completion of the procedure, all disposable equipment used was discarded in the proper designated medical waste containers.  Technical description: Protocol guidelines were followed. After positioning, the target area was identified and prepped in the usual manner. Skin & deeper tissues infiltrated with local anesthetic. Appropriate amount of time allowed to pass for local anesthetics to take effect. Proper needle placement secured. Once satisfactory needle placement was confirmed, I proceeded to inject the desired solution in slow, incremental fashion, intermittently assessing for discomfort or any signs of abnormal or undesired spread of substance. Once completed, the needle was removed and disposed of, as per hospital protocols. The area was cleaned, making sure to leave some of the prepping solution back to take advantage of its long term bactericidal properties.  Aspiration:  Negative        Vitals:   02/26/24 0952 02/26/24 1031  BP:  (!) 158/81  Pulse: 70   Resp: 16 20  Temp: 97.7 F (36.5 C)   TempSrc: Temporal   SpO2: 100% 100%  Weight: 171 lb (77.6 kg)   Height: 5' 8 (1.727 m)      End Time: 1031 hrs  Imaging guidance  Imaging-assisted Technique: None required. Indication(s): N/A Exposure Time: N/A Contrast: None Fluoroscopic Guidance: N/A Ultrasound Guidance: N/A Interpretation: N/A  Post-op assessment  Post-procedure Vital Signs:  Pulse/HCG Rate: 7069 Temp: 97.7 F (36.5 C) Resp: 20 BP: (!) 158/81 SpO2: 100 %  EBL: None  Complications: No immediate post-treatment complications observed by team, or reported by patient.  Note: The patient tolerated the entire procedure well. A repeat set of vitals were taken after the procedure and the patient was kept under observation following institutional  policy, for this type of procedure. Post-procedural neurological assessment was performed, showing return to baseline, prior to discharge. The patient was provided with post-procedure discharge instructions, including a section on how to identify potential problems. Should any problems arise concerning this procedure, the patient was given instructions to immediately contact us , at any time, without hesitation. In any case, we plan to contact the patient by telephone for a follow-up status report regarding this interventional procedure.  Comments:  No additional relevant information.   Medications administered: We administered Sodium Hyaluronate (Viscosup), Sodium Hyaluronate (Viscosup), lidocaine , iohexol , lidocaine , ropivacaine  (PF) 2 mg/mL (0.2%), sodium chloride  flush, and dexamethasone .   Follow-up plan:   Return in about 2 months (around 05/07/2024) for Qutenza  #2 and B/L knee hyalgan #3.     Recent Visits Date Type Provider Dept  02/04/24 Procedure visit Marcelino Nurse, MD [facility]  01/10/24 Office Visit Marcelino Nurse, MD [facility]  Showing recent visits within past 90 days and meeting all other requirements Today's Visits Date Type Provider Dept  02/26/24 Procedure visit Marcelino Nurse, MD [facility]  Showing today's visits and meeting all other requirements Future Appointments Date Type Provider Dept  05/07/24 Appointment Marcelino Nurse, MD [facility]  Showing future appointments within next 90 days and meeting all other requirements   Disposition: Discharge home  Discharge (Date  Time): 02/26/2024;   hrs.   Primary Care Physician: Alla Amis, MD Location: [facility] Note by: Nurse Marcelino, MD Date: 02/26/2024; Time: 10:47 AM  DISCLAIMER: Medicine is not an exact science. It has no guarantees or warranties. The decision to proceed with this intervention  was based on the information collected from  the patient. Conclusions were drawn from the patient's questionnaire, interview, and examination. Because information was provided in large part by the patient, it cannot be guaranteed that it has not been purposely or unconsciously manipulated or altered. Every effort has been made to obtain as much accurate, relevant, available data as possible. Always take into account that the treatment will also be dependent on availability of resources and existing treatment guidelines, considered by other Pain Management Specialists as being common knowledge and practice, at the time of the intervention. It is also important to point out that variation in procedural techniques and pharmacological choices are the acceptable norm. For Medico-Legal review purposes, the indications, contraindications, technique, and results of the these procedures should only be evaluated, judged and interpreted by a Board-Certified Interventional Pain Specialist with extensive familiarity and expertise in the same exact procedure and technique.

## 2024-02-26 NOTE — Patient Instructions (Signed)

## 2024-02-26 NOTE — Progress Notes (Signed)
 PROVIDER NOTE: Interpretation of information contained herein should be left to medically-trained personnel. Specific patient instructions are provided elsewhere under Patient Instructions section of medical record. This document was created in part using STT-dictation technology, any transcriptional errors that may result from this process are unintentional.  Patient: [name] [name] Type: Established DOB: October 29, 1940 MRN: [id number] PCP: Alla Amis, MD  Service: Procedure DOS: 02/26/2024 Setting: Ambulatory Location: Ambulatory outpatient facility Delivery: Face-to-face Provider: Wallie Sherry, MD Specialty: Interventional Pain Management Specialty designation: 09 Location: Outpatient facility Ref. Prov.: Alla Amis, MD       Interventional Therapy   Type: Lumbar epidural steroid injection (LESI) (interlaminar) #1    Laterality: Left   Level:  L5-S1 Level.  Imaging: Fluoroscopic guidance         Anesthesia: Local anesthesia (1-2% Lidocaine ) DOS: 02/26/2024  Performed by: Wallie Sherry, MD  Purpose: Diagnostic/Therapeutic Indications: Lumbar radicular pain of intraspinal etiology of more than 4 weeks that has failed to respond to conservative therapy and is severe enough to impact quality of life or function. 1. Bilateral primary osteoarthritis of knee   2. Lumbar radicular pain   3. Chronic painful diabetic neuropathy (HCC)    NAS-11 Pain score:   Pre-procedure: 8 /10   Post-procedure: 0-No pain/10      Position / Prep / Materials:  Position: Prone w/ head of the table raised (slight reverse trendelenburg) to facilitate breathing.  Prep solution: ChloraPrep (2% chlorhexidine  gluconate and 70% isopropyl alcohol) Prep Area: Entire Posterior Lumbar Region from lower scapular tip down to mid buttocks area and from flank to flank. Materials:  Tray: Epidural tray Needle(s):  Type: Epidural needle (Tuohy) Gauge (G):  22 Length: Regular (3.5-in) Qty: 1  H&P  (Pre-op  Assessment):  [name] is a 83 y.o. (year old), male patient, seen today for interventional treatment. He  has a past surgical history that includes Retinal detachment surgery (Left); Bunion Removal (Right); Hernia repair; Transurethral resection of prostate; Appendectomy; Colonoscopy with propofol  (N/A, 06/14/2015); Prostate biopsy (N/A, 03/27/2017); Prostate biopsy (N/A, 09/11/2019); DIALYSIS/PERMA CATHETER INSERTION (N/A, 10/03/2021); Eye surgery; XI robotic assisted ventral hernia (N/A, 11/23/2021); CAPD insertion (N/A, 11/23/2021); Insertion of mesh (11/23/2021); CAPD revision (N/A, 01/31/2022); CAPD revision (N/A, 04/06/2022); CAPD revision (N/A, 04/14/2022); AV fistula placement (Right, 10/26/2022); UPPER EXTREMITY VENOGRAPHY (Right, 11/20/2022); UPPER EXTREMITY VENOGRAPHY (Right, 11/24/2022); and DIALYSIS/PERMA CATHETER REMOVAL (N/A, 02/12/2023). Mr. Kendall has a current medication list which includes the following prescription(s): acetaminophen , aspirin ec, cholecalciferol, diclofenac sodium, diphenhydramine , polyethylene glycol, sodium bicarbonate , fluticasone , and gabapentin . His primarily concern today is the Knee Pain (Bilateral  ) and Hip Pain (Left )  Initial Vital Signs:  Pulse/HCG Rate: 70ECG Heart Rate: 69 Temp: 97.7 F (36.5 C) Resp: 16 BP: (!) 158/81 SpO2: 100 %  BMI: Estimated body mass index is 26 kg/m as calculated from the following:   Height as of this encounter: 5' 8 (1.727 m).   Weight as of this encounter: 171 lb (77.6 kg).  Risk Assessment: Allergies: Reviewed. He is allergic to fish allergy, morphine , ciprofloxacin , and doxazosin.  Allergy Precautions: None required Coagulopathies: Reviewed. None identified.  Blood-thinner therapy: None at this time Active Infection(s): Reviewed. None identified. Mr. Wenzler is afebrile  Site Confirmation: Mr. Caddell was asked to confirm the procedure and laterality before marking the site Procedure checklist:  Completed Consent: Before the procedure and under the influence of no sedative(s), amnesic(s), or anxiolytics, the patient was informed of the treatment options, risks and possible complications. To fulfill our ethical and legal obligations,  as recommended by the American Medical Association's Code of Ethics, I have informed the patient of my clinical impression; the nature and purpose of the treatment or procedure; the risks, benefits, and possible complications of the intervention; the alternatives, including doing nothing; the risk(s) and benefit(s) of the alternative treatment(s) or procedure(s); and the risk(s) and benefit(s) of doing nothing. The patient was provided information about the general risks and possible complications associated with the procedure. These may include, but are not limited to: failure to achieve desired goals, infection, bleeding, organ or nerve damage, allergic reactions, paralysis, and death. In addition, the patient was informed of those risks and complications associated to Spine-related procedures, such as failure to decrease pain; infection (i.e.: Meningitis, epidural or intraspinal abscess); bleeding (i.e.: epidural hematoma, subarachnoid hemorrhage, or any other type of intraspinal or peri-dural bleeding); organ or nerve damage (i.e.: Any type of peripheral nerve, nerve root, or spinal cord injury) with subsequent damage to sensory, motor, and/or autonomic systems, resulting in permanent pain, numbness, and/or weakness of one or several areas of the body; allergic reactions; (i.e.: anaphylactic reaction); and/or death. Furthermore, the patient was informed of those risks and complications associated with the medications. These include, but are not limited to: allergic reactions (i.e.: anaphylactic or anaphylactoid reaction(s)); adrenal axis suppression; blood sugar elevation that in diabetics may result in ketoacidosis or comma; water retention that in patients with history  of congestive heart failure may result in shortness of breath, pulmonary edema, and decompensation with resultant heart failure; weight gain; swelling or edema; medication-induced neural toxicity; particulate matter embolism and blood vessel occlusion with resultant organ, and/or nervous system infarction; and/or aseptic necrosis of one or more joints. Finally, the patient was informed that Medicine is not an exact science; therefore, there is also the possibility of unforeseen or unpredictable risks and/or possible complications that may result in a catastrophic outcome. The patient indicated having understood very clearly. We have given the patient no guarantees and we have made no promises. Enough time was given to the patient to ask questions, all of which were answered to the patient's satisfaction. Mr. Byers has indicated that he wanted to continue with the procedure. Attestation: I, the ordering provider, attest that I have discussed with the patient the benefits, risks, side-effects, alternatives, likelihood of achieving goals, and potential problems during recovery for the procedure that I have provided informed consent. Date  Time: 02/26/2024  9:46 AM  Pre-Procedure Preparation:  Monitoring: As per clinic protocol. Respiration, ETCO2, SpO2, BP, heart rate and rhythm monitor placed and checked for adequate function Safety Precautions: Patient was assessed for positional comfort and pressure points before starting the procedure. Time-out: I initiated and conducted the Time-out before starting the procedure, as per protocol. The patient was asked to participate by confirming the accuracy of the Time Out information. Verification of the correct person, site, and procedure were performed and confirmed by me, the nursing staff, and the patient. Time-out conducted as per Joint Commission's Universal Protocol (UP.01.01.01). Time: 1026 Start Time: 1027 hrs.  Description/Narrative of Procedure:           Target: Epidural space via interlaminar opening, initially targeting the lower laminar border of the superior vertebral body. Region: Lumbar Approach: Percutaneous paravertebral  Rationale (medical necessity): procedure needed and proper for the diagnosis and/or treatment of the patient's medical symptoms and needs. Procedural Technique Safety Precautions: Aspiration looking for blood return was conducted prior to all injections. At no point did we inject any substances, as a  needle was being advanced. No attempts were made at seeking any paresthesias. Safe injection practices and needle disposal techniques used. Medications properly checked for expiration dates. SDV (single dose vial) medications used. Description of the Procedure: Protocol guidelines were followed. The procedure needle was introduced through the skin, ipsilateral to the reported pain, and advanced to the target area. Bone was contacted and the needle walked caudad, until the lamina was cleared. The epidural space was identified using "loss-of-resistance technique" with 2-3 ml of PF-NaCl (0.9% NSS), in a 5cc LOR glass syringe.  5 cc solution made of 2 cc of preservative-free saline, 2 cc of 0.2% ropivacaine , 1 cc of Decadron  10 mg/cc.\  Vitals:   02/26/24 0952 02/26/24 1031  BP:  (!) 158/81  Pulse: 70   Resp: 16 20  Temp: 97.7 F (36.5 C)   TempSrc: Temporal   SpO2: 100% 100%  Weight: 171 lb (77.6 kg)   Height: 5' 8 (1.727 m)     Start Time: 1027 hrs. End Time: 1031 hrs.  Imaging Guidance (Spinal):          Type of Imaging Technique: Fluoroscopy Guidance (Spinal) Indication(s): Fluoroscopy guidance for needle placement to enhance accuracy in procedures requiring precise needle localization for targeted delivery of medication in or near specific anatomical locations not easily accessible without such real-time imaging assistance. Exposure Time: Please see nurses notes. Contrast: Before injecting any contrast,  we confirmed that the patient did not have an allergy to iodine , shellfish, or radiological contrast. Once satisfactory needle placement was completed at the desired level, radiological contrast was injected. Contrast injected under live fluoroscopy. No contrast complications. See chart for type and volume of contrast used. Fluoroscopic Guidance: I was personally present during the use of fluoroscopy. Tunnel Vision Technique used to obtain the best possible view of the target area. Parallax error corrected before commencing the procedure. Direction-depth-direction technique used to introduce the needle under continuous pulsed fluoroscopy. Once target was reached, antero-posterior, oblique, and lateral fluoroscopic projection used confirm needle placement in all planes. Images permanently stored in EMR. Interpretation: I personally interpreted the imaging intraoperatively. Adequate needle placement confirmed in multiple planes. Appropriate spread of contrast into desired area was observed. No evidence of afferent or efferent intravascular uptake. No intrathecal or subarachnoid spread observed. Permanent images saved into the patient's record.  Antibiotic Prophylaxis:   Anti-infectives (From admission, onward)    None      Indication(s): None identified  Post-operative Assessment:  Post-procedure Vital Signs:  Pulse/HCG Rate: 7069 Temp: 97.7 F (36.5 C) Resp: 20 BP: (!) 158/81 SpO2: 100 %  EBL: None  Complications: No immediate post-treatment complications observed by team, or reported by patient.  Note: The patient tolerated the entire procedure well. A repeat set of vitals were taken after the procedure and the patient was kept under observation following institutional policy, for this type of procedure. Post-procedural neurological assessment was performed, showing return to baseline, prior to discharge. The patient was provided with post-procedure discharge instructions, including a  section on how to identify potential problems. Should any problems arise concerning this procedure, the patient was given instructions to immediately contact us , at any time, without hesitation. In any case, we plan to contact the patient by telephone for a follow-up status report regarding this interventional procedure.  Comments:  No additional relevant information.  Plan of Care (POC)  Orders:  Orders Placed This Encounter  Procedures   NEUROLYSIS    Please order Qutenza  patches    Standing Status:  Future    Expected Date:   05/07/2024    Expiration Date:   02/25/2025    Where will this procedure be performed?:   [facility]   Lumbar Epidural Injection    Scheduling Instructions:     Procedure: Interlaminar LESI TBD     Laterality: TBD     Sedation: Patient's choice     Timeframe: Today    Where will this procedure be performed?:   [facility]   KNEE INJECTION    Indications: Knee arthralgia (pain) due to osteoarthritis (OA) Imaging: None (CPT-20610) Position: Sitting Equipment/Materials: Block tray  1.5, 25-G (one per side)  Local anesthetic  Hyalgan (one per side)    Standing Status:   Future    Expected Date:   05/07/2024    Expiration Date:   02/25/2025    Scheduling Instructions:     Procedure: Knee injection Hyalgan (Hyaluronan/Hyaluronic acid)     Treatment No.:3            Level: Intra-articular     Laterality: Bilateral Knee     Sedation: Patient's choice.    Where will this procedure be performed?:   [facility]   DG PAIN [facility]IN[facility] C-ARM 1-60 MIN NO REPORT    Intraoperative interpretation by procedural physician at [facility].    Standing Status:   Standing    Number of Occurrences:   1    Reason for exam::   Assistance in needle guidance and placement for procedures requiring needle placement in or near specific anatomical locations not easily accessible without such assistance.     Medications ordered for procedure: Meds  ordered this encounter  Medications   Sodium Hyaluronate (Viscosup) SOSY 20 mg    Do not substitute. Deliver to facility day before procedure.   Sodium Hyaluronate (Viscosup) SOSY 20 mg    Do not substitute. Deliver to facility day before procedure.   lidocaine  (XYLOCAINE ) 2 % (with pres) injection 400 mg   iohexol  (OMNIPAQUE ) 180 MG/ML injection 10 mL    Must be Myelogram-compatible. If not available, you may substitute with a water-soluble, non-ionic, hypoallergenic, myelogram-compatible radiological contrast medium.   lidocaine  (XYLOCAINE ) 2 % (with pres) injection 400 mg   ropivacaine  (PF) 2 mg/mL (0.2%) (NAROPIN ) injection 2 mL   sodium chloride  flush (NS) 0.9 % injection 2 mL   dexamethasone  (DECADRON ) injection 10 mg   Medications administered: We administered Sodium Hyaluronate (Viscosup), Sodium Hyaluronate (Viscosup), lidocaine , iohexol , lidocaine , ropivacaine  (PF) 2 mg/mL (0.2%), sodium chloride  flush, and dexamethasone .  See the medical record for exact dosing, route, and time of administration.    Left L4-L5 ESI 02/26/2024    Follow-up plan:   Return in about 2 months (around 05/07/2024) for Qutenza  #2 and B/L knee hyalgan #3.     Recent Visits Date Type Provider Dept  02/04/24 Procedure visit Marcelino Nurse, MD [facility]  01/10/24 Office Visit Marcelino Nurse, MD [facility]  Showing recent visits within past 90 days and meeting all other requirements Today's Visits Date Type Provider Dept  02/26/24 Procedure visit Marcelino Nurse, MD [facility]  Showing today's visits and meeting all other requirements Future Appointments Date Type Provider Dept  05/07/24 Appointment Marcelino Nurse, MD [facility]  Showing future appointments within next 90 days and meeting all other requirements   Disposition: Discharge home  Discharge (Date  Time): 02/26/2024;   hrs.   Primary Care Physician: Alla Amis, MD Location: [facility] Note by: Wallie Sherry, MD (TTS technology used. I apologize for any typographical errors that were not detected and corrected.) Date: 02/26/2024; Time: 10:46 AM  Disclaimer:  Medicine is not an Visual merchandiser. The only guarantee in medicine is that nothing is guaranteed. It is important to note that the decision to proceed with this intervention was based on the information collected from the patient. The Data and conclusions were drawn from the patient's questionnaire, the interview, and the physical examination. Because the information was provided in large part by the patient, it cannot be guaranteed that it has not been purposely or unconsciously manipulated. Every effort has been made to obtain as much relevant data as possible for this evaluation. It is important to note that the conclusions that lead to this procedure are derived in large part from the available data. Always take into account that the treatment will also be dependent on availability of resources and existing treatment guidelines, considered by other Pain Management Practitioners as being common knowledge and practice, at the time of the intervention. For Medico-Legal purposes, it is also important to point out that variation in procedural techniques and pharmacological choices are the acceptable norm. The indications, contraindications, technique, and results of the above procedure should only be interpreted and judged by a Board-Certified Interventional Pain Specialist with extensive familiarity and expertise in the same exact procedure and technique.

## 2024-03-11 ENCOUNTER — Telehealth: Payer: Self-pay | Admitting: *Deleted

## 2024-03-11 DIAGNOSIS — M5416 Radiculopathy, lumbar region: Secondary | ICD-10-CM

## 2024-03-11 DIAGNOSIS — M170 Bilateral primary osteoarthritis of knee: Secondary | ICD-10-CM

## 2024-03-11 DIAGNOSIS — M79604 Pain in right leg: Secondary | ICD-10-CM

## 2024-03-11 DIAGNOSIS — E1140 Type 2 diabetes mellitus with diabetic neuropathy, unspecified: Secondary | ICD-10-CM

## 2024-03-11 NOTE — Telephone Encounter (Signed)
 Patient called after grievance contacted me. He states he would like to see a vascular surgeon. States Dr. Marcelino is doing pretty fair. His feet no longer hurt but having terrible knee pain. It has got a little better but is very slow. States Dr. Marcelino told him it would be slow. He wants to speed up the process. Can anything else be done. Has to go to dialysis and hurts really bad.

## 2024-03-11 NOTE — Addendum Note (Signed)
 Addended by: MARCELINO NURSE on: 03/11/2024 02:35 PM   Modules accepted: Orders

## 2024-04-08 NOTE — Progress Notes (Signed)
 Chief Complaint  Patient presents with  . Follow-up    HPI  [name] [name] is a 83 y.o. here for follow up of chronic medical issues  Thrombocytopenia: No acute issues.  No bleeding.  Currently asymptomatic.  Chronic pain syndrome: Patient has chronic pain of lower extremities and multiple joints.  He has now established with pain management.  He is very frustrated after seeing orthopedics that he is not improving.  Has had multiple injections and states that pain management will not provide narcotics.  Patient has underlying chronic advanced osteoarthritis of multiple joints.  HLD: No acute issues.  Tolerating medications without adverse effects.  Denies any myalgia.  No cp or CVA symptoms.   Anemia of chronic disease: No acute issues.  No bleeding.  Has end-stage renal disease.  ROS Review of systems is unremarkable for any active cardiac, respiratory, GI, GU, hematologic, neurologic, dermatologic, HEENT, or psychiatric symptoms except as noted above.  No fevers, chills, or constitutional symptoms.   Current Outpatient Medications  Medication Sig Dispense Refill  . acetaminophen  (TYLENOL ) 500 MG tablet Take 1,000 mg by mouth 2 (two) times daily    . aspirin 81 MG EC tablet Take 81 mg by mouth once daily.    . azelastine  (ASTELIN ) 137 mcg nasal spray Use 1 spray(s) in each nostril twice daily (Patient taking differently: Place 1 spray into both nostrils 2 (two) times daily as needed) 30 mL 0  . cholecalciferol (VITAMIN D3) 2,000 unit capsule Take 2,000 Units by mouth once daily    . Febuxostat  (ULORIC ) 40 mg tablet TAKE 1 TABLET BY MOUTH ONCE  DAILY 100 tablet 1  . fluticasone  propionate (FLONASE ) 50 mcg/actuation nasal spray Place 1 spray into both nostrils once daily as needed       . lidocaine -prilocaine  (EMLA ) cream Apply topically as needed    . loratadine (CLARITIN) 5 mg chewable tablet Take 5 mg by mouth once daily.    SABRA lovastatin (MEVACOR) 20 MG tablet Take 20 mg by mouth at  bedtime    . RENA-VITE 0.8 mg Take 1 tablet by mouth once daily    . sodium bicarbonate  650 MG tablet Take 1,300 mg by mouth 2 (two) times daily    . trolamine salicylate (ASPERCREME TOPICAL) Apply topically as needed    . bicalutamide  (CASODEX ) 50 MG tablet Take 50 mg by mouth once daily     No current facility-administered medications for this visit.    Allergies as of 04/08/2024 - Reviewed 04/08/2024  Allergen Reaction Noted  . Fish containing products Swelling 04/12/2021  . Morphine  Other (See Comments) 11/24/2022  . Ciprofloxacin  Other (See Comments) 09/08/2019  . Doxazosin Unknown and Other (See Comments) 03/06/2014  . Others Swelling   . Shellfish containing products Swelling 03/19/2017    Patient Active Problem List  Diagnosis  . ESRD (end stage renal disease) on dialysis (CMS-HCC) - followed by Dr. Marcelino  . History of gout (uric acid 5.5 - 12/30/18)  . Mixed hyperlipidemia (LDL 106 - 04/01/24)  . Anemia of chronic disease (Hgb 11.3 - 04/01/24) - followed by Dr. Marcelino  . Medicare annual wellness visit, initial: 06/03/12  . Medicare annual wellness visit, subsequent 07/26/23  . 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 Ventral hernia without obstruction or gangrene (asymptomatic)  . Seasonal allergic rhinitis due to pollen  . History of prostate cancer  . Diabetes mellitus, type II (CMS/HHS-HCC)  . Gout  . Primary osteoarthritis involving multiple joints  . Other chronic  pain (lower extremity) - followed by Dr. Marcelino (pain management)    Past Medical History:  Diagnosis Date  . Anemia of chronic disease   . Chronic renal failure   . Diabetes mellitus (CMS/HHS-HCC)    adult onset  . Diverticulosis 06/14/2015  . Erectile dysfunction   . History of gout   . Hyperlipidemia   . Hyperplastic colon polyp 06/14/2015  . Hypertension   . Obesity   . Sleep apnea   . Tubular adenoma of colon 06/14/2015    Past Surgical History:  Procedure Laterality Date  .  COLONOSCOPY  06/14/2015   Tubular adenoma of colon/Hyperplastic colon polyp/Repeat 10yrs/MUS  . HERNIA REPAIR    . Laparoscopic appendectomy s/p rupture per Dr. Lonni Lundquist (09/2012)    . Left retinal detachment surgery    . Post TUR procedure    . Right foot bunion removal      Vitals:   04/08/24 0935 04/08/24 0943  BP: (!) 170/70 (!) 168/72  Pulse: 73   SpO2: 98%   Weight: 79.2 kg (174 lb 9.6 oz)   Height: 180.3 cm (5' 11)   PainSc:   8   PainLoc: Leg    Body mass index is 24.35 kg/m.  Exam  General. Well appearing mildly irritated elderly male; VS reviewed     Eyes. Sclera and conjunctiva clear; Vision grossly intact; extraocular movements intact Neck. Supple.  Lungs. Respirations unlabored; clear to auscultation bilaterally Cardiovascular. Heart regular rate and rhythm without murmurs, gallops, or rubs Abdomen. Soft; non tender; non distended; no masses or organomegaly Skin. Normal color and turgor Neurologic. Alert and oriented x3; no new focal deficits; uses a rolling walker for stability  Assessment and Plan  1. 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Plt 124 - 04/01/24) Slightly worsened.  Asymptomatic.  Monitor for now. -     CBC w/auto Differential (5 Part); Future  2. Anemia of chronic disease (Hgb 11.3 - 04/01/24) - followed by Dr. Marcelino Slightly improved.  Secondary to end-stage renal disease.  Monitor for now. -     CBC w/auto Differential (5 Part); Future  3. Mixed hyperlipidemia (LDL 106 - 04/01/24) Well controlled.  LFTs wnls (03/2024). Continue with current regimen.  Counseled on mediterranean diet and aerobic exercise. -     Comprehensive Metabolic Panel (CMP); Future -     Lipid Panel w/calc LDL; Future  4. Other chronic pain (lower extremity) - followed by Dr. Marcelino (pain management) Ongoing.  Patient has significant osteoarthritis in multiple joints.  He is currently followed by pain management.  He has seen orthopedics who has declined surgery due to  high risk.  Patient extremely irritated about his current condition.  I was apologetic that I was not able to help him more.  We have tried Celebrex in the past but he is off Celebrex now.  Will defer management to pain management.  Other orders -     Follow up in Primary Care; Future  F/u 6 months for PE and subsequent Medicare wellness.  Labs prior  ALDA CARPEN, MD

## 2024-04-28 ENCOUNTER — Other Ambulatory Visit (INDEPENDENT_AMBULATORY_CARE_PROVIDER_SITE_OTHER): Payer: Self-pay | Admitting: Vascular Surgery

## 2024-04-28 DIAGNOSIS — M79605 Pain in left leg: Secondary | ICD-10-CM

## 2024-04-29 ENCOUNTER — Encounter (INDEPENDENT_AMBULATORY_CARE_PROVIDER_SITE_OTHER): Payer: Self-pay | Admitting: Vascular Surgery

## 2024-04-29 ENCOUNTER — Ambulatory Visit (INDEPENDENT_AMBULATORY_CARE_PROVIDER_SITE_OTHER)

## 2024-04-29 ENCOUNTER — Ambulatory Visit (INDEPENDENT_AMBULATORY_CARE_PROVIDER_SITE_OTHER): Admitting: Vascular Surgery

## 2024-04-29 VITALS — BP 162/72 | HR 73 | Resp 18

## 2024-04-29 DIAGNOSIS — Z992 Dependence on renal dialysis: Secondary | ICD-10-CM

## 2024-04-29 DIAGNOSIS — M79605 Pain in left leg: Secondary | ICD-10-CM

## 2024-04-29 DIAGNOSIS — N186 End stage renal disease: Secondary | ICD-10-CM | POA: Diagnosis not present

## 2024-04-29 DIAGNOSIS — E1122 Type 2 diabetes mellitus with diabetic chronic kidney disease: Secondary | ICD-10-CM

## 2024-04-29 DIAGNOSIS — I10 Essential (primary) hypertension: Secondary | ICD-10-CM | POA: Diagnosis not present

## 2024-04-29 DIAGNOSIS — M79604 Pain in right leg: Secondary | ICD-10-CM

## 2024-04-29 DIAGNOSIS — E782 Mixed hyperlipidemia: Secondary | ICD-10-CM | POA: Diagnosis not present

## 2024-04-29 DIAGNOSIS — M79609 Pain in unspecified limb: Secondary | ICD-10-CM | POA: Insufficient documentation

## 2024-04-29 NOTE — Progress Notes (Signed)
 Patient ID: [name] [name], male   DOB: 02-Nov-1940, 83 y.o.   MRN: [id number]  Chief Complaint  Patient presents with   Follow-up    Ref [name] [name] consult abi    HPI [name] [name] is a 83 y.o. male.  I am asked to see the patient by Dr. WENDI Sherry for evaluation of leg pain and claudication symptoms.  [name] is a patient well-known to our practice for his dialysis access.  I did a right brachiocephalic AV fistula on him about a year and a half ago.  He had some central venous issues that we also treated and his arm is now doing well.  His fistula is working fine and he is using it for dialysis on Mondays, Wednesdays, and Fridays. His biggest current issue is of leg pain and weakness.  He has had difficulty walking and is now been using a wheelchair most of the time.  After some injections by his pain specialist has been able to walk some with a walker.  Given his extensive atherosclerotic risk factors, concern for PAD as a cause of his leg pain was present.  As such, ABIs were done today to evaluate this.  His ABIs were 1.09 on the right and 1.11 on the left with triphasic waveforms and normal digital pressures bilaterally consistent with no arterial insufficiency.     Past Medical History:  Diagnosis Date   Anemia    Arthritis    Cancer (HCC)    Chronic kidney disease    Diabetes mellitus without complication (HCC)    ESRD (end stage renal disease) (HCC)    GERD (gastroesophageal reflux disease)    h/o   Gout    Hyperlipidemia, mixed    Hypertension    Hypokalemia    Renal mass    Secondary hyperparathyroidism of renal origin (HCC)    Sleep apnea    h/o no cpap   Thrombocytopenia (HCC)     Past Surgical History:  Procedure Laterality Date   APPENDECTOMY     AV FISTULA PLACEMENT Right 10/26/2022   Procedure: ARTERIOVENOUS (AV) FISTULA CREATION;  Surgeon: Marea Selinda RAMAN, MD;  Location: [facility];  Service: Vascular;  Laterality: Right;   Bunion Removal Right    CAPD  INSERTION N/A 11/23/2021   Procedure: LAPAROSCOPIC INSERTION CONTINUOUS AMBULATORY PERITONEAL DIALYSIS  (CAPD) CATHETER;  Surgeon: Desiderio Schanz, MD;  Location: [facility];  Service: General;  Laterality: N/A;   CAPD REVISION N/A 01/31/2022   Procedure: LAPAROSCOPIC REVISION CONTINUOUS AMBULATORY PERITONEAL DIALYSIS  (CAPD) CATHETER;  Surgeon: Desiderio Schanz, MD;  Location: [facility];  Service: General;  Laterality: N/A;   CAPD REVISION N/A 04/06/2022   Procedure: LAPAROSCOPIC REVISION CONTINUOUS AMBULATORY PERITONEAL DIALYSIS  (CAPD) CATHETER, replacement;  Surgeon: Desiderio Schanz, MD;  Location: [facility];  Service: General;  Laterality: N/A;   CAPD REVISION N/A 04/14/2022   Procedure: LAPAROSCOPIC ASSISTED CONTINUOUS AMBULATORY PERITONEAL DIALYSIS  (CAPD) CATHETER REMOVAL;  Surgeon: Desiderio Schanz, MD;  Location: [facility];  Service: General;  Laterality: N/A;   COLONOSCOPY WITH PROPOFOL  N/A 06/14/2015   Procedure: COLONOSCOPY WITH PROPOFOL ;  Surgeon: Gladis RAYMOND Mariner, MD;  Location: [facility];  Service: Endoscopy;  Laterality: N/A;   DIALYSIS/PERMA CATHETER INSERTION N/A 10/03/2021   Procedure: DIALYSIS/PERMA CATHETER INSERTION;  Surgeon: Marea Selinda RAMAN, MD;  Location: [facility];  Service: Cardiovascular;  Laterality: N/A;   DIALYSIS/PERMA CATHETER REMOVAL N/A 02/12/2023   Procedure: DIALYSIS/PERMA CATHETER REMOVAL;  Surgeon: Marea Selinda RAMAN, MD;  Location:  [facility];  Service: Cardiovascular;  Laterality: N/A;   EYE SURGERY     HERNIA REPAIR     abd   INSERTION OF MESH  11/23/2021   Procedure: INSERTION OF MESH;  Surgeon: Desiderio Schanz, MD;  Location: [facility];  Service: General;;   PROSTATE BIOPSY N/A 03/27/2017   Procedure: PROSTATE BIOPSY-URO NAV;  Surgeon: Kassie Ozell SAUNDERS, MD;  Location: [facility];  Service: Urology;  Laterality: N/A;   PROSTATE BIOPSY N/A 09/11/2019   Procedure: PROSTATE BIOPSY GRAYCE LIGHT;  Surgeon: Kassie Ozell SAUNDERS, MD;  Location: [facility];  Service:  Urology;  Laterality: N/A;   RETINAL DETACHMENT SURGERY Left    TRANSURETHRAL RESECTION OF PROSTATE     UPPER EXTREMITY VENOGRAPHY Right 11/20/2022   Procedure: UPPER EXTREMITY VENOGRAPHY;  Surgeon: Marea Selinda RAMAN, MD;  Location: [facility];  Service: Cardiovascular;  Laterality: Right;   UPPER EXTREMITY VENOGRAPHY Right 11/24/2022   Procedure: UPPER EXTREMITY VENOGRAPHY;  Surgeon: Marea Selinda RAMAN, MD;  Location: [facility];  Service: Cardiovascular;  Laterality: Right;   XI ROBOTIC ASSISTED VENTRAL HERNIA N/A 11/23/2021   Procedure: XI ROBOTIC ASSISTED VENTRAL HERNIA, incisional;  Surgeon: Desiderio Schanz, MD;  Location: [facility];  Service: General;  Laterality: N/A;     Family History  Problem Relation Age of Onset   Leukemia Father    Leukemia Brother   No bleeding or clotting disorders No aneurysms   Social History   Tobacco Use   Smoking status: Never   Smokeless tobacco: Never  Vaping Use   Vaping status: Never Used  Substance Use Topics   Alcohol use: Not Currently   Drug use: No     Allergies  Allergen Reactions   Fish Allergy Swelling   Morphine  Other (See Comments)    About 5-10 minutes after admin of 2 mg morphine  IV, patient became very agitated and tearful, loudly expressing desire to be taken by Jesus, unable to reason with or calm patient   Ciprofloxacin      Unknown reaction   Doxazosin Other (See Comments)    unknown    Current Outpatient Medications  Medication Sig Dispense Refill   acetaminophen  (TYLENOL ) 500 MG tablet Take 1,000 mg by mouth every 8 (eight) hours as needed.     aspirin EC 81 MG tablet Take 81 mg by mouth daily. Swallow whole.     Cholecalciferol (D3 2000 PO) Take by mouth daily.     diclofenac Sodium (VOLTAREN) 1 % GEL Apply topically daily as needed.     diphenhydrAMINE  (BENADRYL ) 25 mg capsule Take 25 mg by mouth every 6 (six) hours as needed.     polyethylene glycol (MIRALAX  / GLYCOLAX ) 17 g packet Take 17 g by mouth  daily as needed.     sodium bicarbonate  325 MG tablet Take 325 mg by mouth 3 (three) times daily.     fluticasone  (FLONASE ) 50 MCG/ACT nasal spray Place 1 spray into both nostrils daily as needed for allergies or rhinitis. (Patient not taking: Reported on 04/29/2024)     gabapentin  (NEURONTIN ) 300 MG capsule Take 300 mg by mouth at bedtime. (Patient not taking: Reported on 04/29/2024)     No current facility-administered medications for this visit.      REVIEW OF SYSTEMS (Negative unless checked)  Constitutional: [] Weight loss  [] Fever  [] Chills Cardiac: [] Chest pain   [] Chest pressure   [] Palpitations   [] Shortness of breath when laying flat   [] Shortness of breath at rest   []   Shortness of breath with exertion. Vascular:  [] Pain in legs with walking   [] Pain in legs at rest   [] Pain in legs when laying flat   [] Claudication   [] Pain in feet when walking  [] Pain in feet at rest  [] Pain in feet when laying flat   [] History of DVT   [] Phlebitis   [] Swelling in legs   [] Varicose veins   [] Non-healing ulcers Pulmonary:   [] Uses home oxygen   [] Productive cough   [] Hemoptysis   [] Wheeze  [] COPD   [] Asthma Neurologic:  [] Dizziness  [] Blackouts   [] Seizures   [] History of stroke   [] History of TIA  [] Aphasia   [] Temporary blindness   [] Dysphagia   [] Weakness or numbness in arms   [] Weakness or numbness in legs Musculoskeletal:  [x] Arthritis   [] Joint swelling   [] Joint pain   [] Low back pain Hematologic:  [] Easy bruising  [] Easy bleeding   [] Hypercoagulable state   [x] Anemic  [] Hepatitis Gastrointestinal:  [] Blood in stool   [] Vomiting blood  [x] Gastroesophageal reflux/heartburn   [] Abdominal pain Genitourinary:  [x] Chronic kidney disease   [] Difficult urination  [] Frequent urination  [] Burning with urination   [] Hematuria Skin:  [] Rashes   [] Ulcers   [] Wounds Psychological:  [] History of anxiety   []  History of major depression.    Physical Exam BP (!) 162/72   Pulse 73   Resp 18  Gen:  WD/WN,  NAD Head: [place]/AT, No temporalis wasting.  Ear/Nose/Throat: Hearing grossly intact, nares w/o erythema or drainage, oropharynx w/o Erythema/Exudate Eyes: Conjunctiva clear, sclera non-icteric  Neck: trachea midline.  No JVD.  Pulmonary:  Good air movement, respirations not labored, no use of accessory muscles  Cardiac: RRR, no JVD Vascular: Right arm AV fistula with excellent thrill. Vessel Right Left  Radial Palpable Palpable                          PT Palpable Palpable  DP Palpable Palpable   Gastrointestinal:. No masses, surgical incisions, or scars. Musculoskeletal: M/S 5/5 throughout.  Extremities without ischemic changes.  No deformity or atrophy.  Using a wheelchair currently.  No lower extremity edema. Neurologic: Sensation grossly intact in extremities.  Symmetrical.  Speech is fluent. Motor exam as listed above. Psychiatric: Judgment intact, Mood & affect appropriate for pt's clinical situation. Dermatologic: No rashes or ulcers noted.  No cellulitis or open wounds.    Radiology No results found.  Labs No results found for this or any previous visit (from the past 2160 hours).  Assessment/Plan:  ESRD (end stage renal disease) on dialysis [facility]) Access is working well. Recheck with duplex in one year.  Pain in limb ABIs were done today to evaluate this.  His ABIs were 1.09 on the right and 1.11 on the left with triphasic waveforms and normal digital pressures bilaterally consistent with no arterial insufficiency.  Pain is not from vascular disease. Likely neurogenic. Agree with injections and PT.   Type 2 diabetes mellitus with chronic kidney disease (HCC) blood glucose control important in reducing the progression of atherosclerotic disease. Also, involved in wound healing. On appropriate medications.     Benign essential hypertension blood pressure control important in reducing the progression of atherosclerotic disease. On appropriate oral medications.      Mixed hyperlipidemia lipid control important in reducing the progression of atherosclerotic disease. Continue statin therapy    Selinda Gu 04/29/2024, 11:39 AM   This note was created with Dragon medical transcription system.  Any  errors from dictation are unintentional.

## 2024-04-29 NOTE — Assessment & Plan Note (Signed)
 Access is working well. Recheck with duplex in one year.

## 2024-04-29 NOTE — Assessment & Plan Note (Signed)
 ABIs were done today to evaluate this.  His ABIs were 1.09 on the right and 1.11 on the left with triphasic waveforms and normal digital pressures bilaterally consistent with no arterial insufficiency.  Pain is not from vascular disease. Likely neurogenic. Agree with injections and PT.

## 2024-05-07 ENCOUNTER — Ambulatory Visit: Admitting: Student in an Organized Health Care Education/Training Program

## 2024-05-08 ENCOUNTER — Ambulatory Visit
Attending: Student in an Organized Health Care Education/Training Program | Admitting: Student in an Organized Health Care Education/Training Program

## 2024-05-08 VITALS — BP 146/71 | HR 81 | Resp 16 | Ht 68.0 in | Wt 171.0 lb

## 2024-05-08 DIAGNOSIS — E1140 Type 2 diabetes mellitus with diabetic neuropathy, unspecified: Secondary | ICD-10-CM | POA: Diagnosis present

## 2024-05-08 DIAGNOSIS — M170 Bilateral primary osteoarthritis of knee: Secondary | ICD-10-CM | POA: Diagnosis present

## 2024-05-08 MED ORDER — SODIUM HYALURONATE (VISCOSUP) 20 MG/2ML IX SOSY
2.0000 mL | PREFILLED_SYRINGE | Freq: Once | INTRA_ARTICULAR | Status: AC
  Administered 2024-05-08: 20 mg via INTRA_ARTICULAR

## 2024-05-08 MED ORDER — CAPSAICIN-CLEANSING GEL 8 % EX KIT
4.0000 | PACK | Freq: Once | CUTANEOUS | Status: AC
  Administered 2024-05-08: 4 via TOPICAL
  Filled 2024-05-08: qty 4

## 2024-05-08 MED ORDER — LIDOCAINE HCL 2 % IJ SOLN
20.0000 mL | Freq: Once | INTRAMUSCULAR | Status: AC
  Administered 2024-05-08: 400 mg
  Filled 2024-05-08: qty 20

## 2024-05-08 NOTE — Progress Notes (Signed)
 PROVIDER NOTE: Interpretation of information contained herein should be left to medically-trained personnel. Specific patient instructions are provided elsewhere under Patient Instructions section of medical record. This document was created in part using STT-dictation technology, any transcriptional errors that may result from this process are unintentional.  Patient: [name] [name] Type: Established DOB: 08/28/1940 MRN: [id number] PCP: Alla Amis, MD  Service: Procedure DOS: 05/08/2024 Setting: Ambulatory Location: Ambulatory outpatient facility Delivery: Face-to-face Provider: Wallie Sherry, MD Specialty: Interventional Pain Management Specialty designation: 09 Location: Outpatient facility Ref. Prov.: Alla Amis, MD       Interventional Therapy   Type:  Hyalgan Intra-articular Knee Injection #3  Laterality: Bilateral (-50) Level/approach: Medial Imaging guidance: None required ([id number]) Anesthesia: Local anesthesia (1-2% Lidocaine ) DOS: 05/08/2024  Performed by: Wallie Sherry, MD  Purpose: Diagnostic/Therapeutic Indications: Knee arthralgia associated to osteoarthritis of the knee 1. Chronic painful diabetic neuropathy (HCC)   2. Bilateral primary osteoarthritis of knee    NAS-11 score:   Pre-procedure: 6 /10   Post-procedure: 6 /10     Pre-Procedure Preparation  Monitoring: As per clinic protocol.  Risk Assessment: Vitals:  AFP:Zdupfjuzi body mass index is 26 kg/m as calculated from the following:   Height as of this encounter: 5' 8 (1.727 m).   Weight as of this encounter: 171 lb (77.6 kg)., Rate:81 , BP:(!) 146/71, Resp:16, Temp: , SpO2:100 %  Allergies: He is allergic to fish allergy, morphine , ciprofloxacin , and doxazosin.  Precautions: No additional precautions required  Blood-thinner(s): None at this time  Coagulopathies: Reviewed. None identified.   Active Infection(s): Reviewed. None identified. [name] [name] is afebrile   Location setting:  Exam room Position: Sitting w/ knee bent 90 degrees Safety Precautions: Patient was assessed for positional comfort and pressure points before starting the procedure. Prepping solution: DuraPrep (Iodine  Povacrylex [0.7% available iodine ] and Isopropyl Alcohol, 74% w/w) Prep Area: Entire knee region Approach: percutaneous, just above the tibial plateau, lateral to the infrapatellar tendon. Intended target: Intra-articular knee space Materials: Tray: Block Needle(s): Regular Qty: 1/side Length: 1.5-inch Gauge: 25G (x1) + 22G (x1)  Meds ordered this encounter  Medications   capsaicin  topical system 8 % patch 4 patch   Sodium Hyaluronate (Viscosup) SOSY 20 mg    Do not substitute. Deliver to facility day before procedure.   Sodium Hyaluronate (Viscosup) SOSY 20 mg    Do not substitute. Deliver to facility day before procedure.   lidocaine  (XYLOCAINE ) 2 % (with pres) injection 400 mg    No orders of the defined types were placed in this encounter.    Time-out: 1340 I initiated and conducted the Time-out before starting the procedure, as per protocol. The patient was asked to participate by confirming the accuracy of the Time Out information. Verification of the correct person, site, and procedure were performed and confirmed by me, the nursing staff, and the patient. Time-out conducted as per Joint Commission's Universal Protocol (UP.01.01.01). Procedure checklist: Completed  H&P (Pre-op   Assessment)  [name] [name] is a 83 y.o. (year old), male patient, seen today for interventional treatment. He  has a past surgical history that includes Retinal detachment surgery (Left); Bunion Removal (Right); Hernia repair; Transurethral resection of prostate; Appendectomy; Colonoscopy with propofol  (N/A, 06/14/2015); Prostate biopsy (N/A, 03/27/2017); Prostate biopsy (N/A, 09/11/2019); DIALYSIS/PERMA CATHETER INSERTION (N/A, 10/03/2021); Eye surgery; XI robotic assisted ventral hernia (N/A,  11/23/2021); CAPD insertion (N/A, 11/23/2021); Insertion of mesh (11/23/2021); CAPD revision (N/A, 01/31/2022); CAPD revision (N/A, 04/06/2022); CAPD revision (N/A, 04/14/2022); AV fistula placement (Right, 10/26/2022); UPPER EXTREMITY VENOGRAPHY (  Right, 11/20/2022); UPPER EXTREMITY VENOGRAPHY (Right, 11/24/2022); and DIALYSIS/PERMA CATHETER REMOVAL (N/A, 02/12/2023). [name] [name] has a current medication list which includes the following prescription(s): acetaminophen , aspirin ec, cholecalciferol, diclofenac sodium, diphenhydramine , fluticasone , gabapentin , polyethylene glycol, and sodium bicarbonate . His primarily concern today is the Knee Pain (bilateral) and Foot Pain (Numb bilateral -able to drive some since procedures)  He is allergic to fish allergy, morphine , ciprofloxacin , and doxazosin.   Last encounter: My last encounter with him was on 02/26/2024. Pertinent problems: [name] [name] does not have any pertinent problems on file. Pain Assessment: Severity of Chronic pain is reported as a 6 /10. Location: Knee Right, Left/denies. Onset: More than a month ago. Quality: Aching, Discomfort, Sore, Numbness, Nagging, Pressure. Timing: Constant. Modifying factor(s): procedures, rest. Vitals:  height is 5' 8 (1.727 m) and weight is 171 lb (77.6 kg). His blood pressure is 146/71 (abnormal) and his pulse is 81. His respiration is 16 and oxygen saturation is 100%.   Reason for encounter: interventional pain management therapy due pain of at least four (4) weeks in duration, with failure to respond and/or inability to tolerate more conservative care.  Site Confirmation: [name] [name] was asked to confirm the procedure and laterality before marking the site.  Consent: Before the procedure and under the influence of no sedative(s), amnesic(s), or anxiolytics, the patient was informed of the treatment options, risks and possible complications. To fulfill our ethical and legal obligations, as recommended by the American Medical  Association's Code of Ethics, I have informed the patient of my clinical impression; the nature and purpose of the treatment or procedure; the risks, benefits, and possible complications of the intervention; the alternatives, including doing nothing; the risk(s) and benefit(s) of the alternative treatment(s) or procedure(s); and the risk(s) and benefit(s) of doing nothing. The patient was provided information about the general risks and possible complications associated with the procedure. These may include, but are not limited to: failure to achieve desired goals, infection, bleeding, organ or nerve damage, allergic reactions, paralysis, and death. In addition, the patient was informed of those risks and complications associated to Spine-related procedures, such as failure to decrease pain; infection (i.e.: Meningitis, epidural or intraspinal abscess); bleeding (i.e.: epidural hematoma, subarachnoid hemorrhage, or any other type of intraspinal or peri-dural bleeding); organ or nerve damage (i.e.: Any type of peripheral nerve, nerve root, or spinal cord injury) with subsequent damage to sensory, motor, and/or autonomic systems, resulting in permanent pain, numbness, and/or weakness of one or several areas of the body; allergic reactions; (i.e.: anaphylactic reaction); and/or death. Furthermore, the patient was informed of those risks and complications associated with the medications. These include, but are not limited to: allergic reactions (i.e.: anaphylactic or anaphylactoid reaction(s)); adrenal axis suppression; blood sugar elevation that in diabetics may result in ketoacidosis or comma; water retention that in patients with history of congestive heart failure may result in shortness of breath, pulmonary edema, and decompensation with resultant heart failure; weight gain; swelling or edema; medication-induced neural toxicity; particulate matter embolism and blood vessel occlusion with resultant organ, and/or  nervous system infarction; and/or aseptic necrosis of one or more joints. Finally, the patient was informed that Medicine is not an exact science; therefore, there is also the possibility of unforeseen or unpredictable risks and/or possible complications that may result in a catastrophic outcome. The patient indicated having understood very clearly. We have given the patient no guarantees and we have made no promises. Enough time was given to the patient to ask questions, all of  which were answered to the patient's satisfaction. Mr. Ellinwood has indicated that he wanted to continue with the procedure. Attestation: I, the ordering provider, attest that I have discussed with the patient the benefits, risks, side-effects, alternatives, likelihood of achieving goals, and potential problems during recovery for the procedure that I have provided informed consent.  Date  Time: 05/08/2024  1:03 PM  Description of procedure  Start Time: 1340 hrs  Local Anesthesia: Once the patient was positioned, prepped, and time-out was completed. The target area was identified located. The skin was marked with an approved surgical skin marker. Once marked, the skin (epidermis, dermis, and hypodermis), and deeper tissues (fat, connective tissue and muscle) were infiltrated with a small amount of a short-acting local anesthetic, loaded on a 10cc syringe with a 25G, 1.5-in  Needle. An appropriate amount of time was allowed for local anesthetics to take effect before proceeding to the next step. Local Anesthetic: Lidocaine  1-2% The unused portion of the local anesthetic was discarded in the proper designated containers. Safety Precautions: Aspiration looking for blood return was conducted prior to all injections. At no point did I inject any substances, as a needle was being advanced. Before injecting, the patient was told to immediately notify me if he was experiencing any new onset of ringing in the ears, or metallic taste in  the mouth. No attempts were made at seeking any paresthesias. Safe injection practices and needle disposal techniques used. Medications properly checked for expiration dates. SDV (single dose vial) medications used. After the completion of the procedure, all disposable equipment used was discarded in the proper designated medical waste containers.  Technical description: Protocol guidelines were followed. After positioning, the target area was identified and prepped in the usual manner. Skin & deeper tissues infiltrated with local anesthetic. Appropriate amount of time allowed to pass for local anesthetics to take effect. Proper needle placement secured. Once satisfactory needle placement was confirmed, I proceeded to inject the desired solution in slow, incremental fashion, intermittently assessing for discomfort or any signs of abnormal or undesired spread of substance. Once completed, the needle was removed and disposed of, as per hospital protocols. The area was cleaned, making sure to leave some of the prepping solution back to take advantage of its long term bactericidal properties.  Aspiration:  Negative        Vitals:   05/08/24 1310  BP: (!) 146/71  Pulse: 81  Resp: 16  SpO2: 100%  Weight: 171 lb (77.6 kg)  Height: 5' 8 (1.727 m)     End Time: 1425 hrs  Imaging guidance  Imaging-assisted Technique: None required. Indication(s): N/A Exposure Time: N/A Contrast: None Fluoroscopic Guidance: N/A Ultrasound Guidance: N/A Interpretation: N/A  Post-op assessment  Post-procedure Vital Signs:  Pulse/HCG Rate: 81  Temp:   Resp: 16 BP: (!) 146/71 SpO2: 100 %  EBL: None  Complications: No immediate post-treatment complications observed by team, or reported by patient.  Note: The patient tolerated the entire procedure well. A repeat set of vitals were taken after the procedure and the patient was kept under observation following institutional policy, for this type of  procedure. Post-procedural neurological assessment was performed, showing return to baseline, prior to discharge. The patient was provided with post-procedure discharge instructions, including a section on how to identify potential problems. Should any problems arise concerning this procedure, the patient was given instructions to immediately contact us , at any time, without hesitation. In any case, we plan to contact the patient by telephone for a  follow-up status report regarding this interventional procedure.  Comments:  No additional relevant information.   Medications administered: We administered capsaicin  topical system, Sodium Hyaluronate (Viscosup), Sodium Hyaluronate (Viscosup), and lidocaine .   Follow-up plan:   Return in about 6 weeks (around 06/19/2024) for PPE, VV.     Recent Visits Date Type Provider Dept  02/26/24 Procedure visit Marcelino Nurse, MD [facility]  Showing recent visits within past 90 days and meeting all other requirements Today's Visits Date Type Provider Dept  05/08/24 Procedure visit Marcelino Nurse, MD [facility]  Showing today's visits and meeting all other requirements Future Appointments Date Type Provider Dept  06/19/24 Appointment Marcelino Nurse, MD [facility]  Showing future appointments within next 90 days and meeting all other requirements   Disposition: Discharge home  Discharge (Date  Time): 05/08/2024; 1440 hrs.   Primary Care Physician: Alla Amis, MD Location: [facility] Note by: Nurse Marcelino, MD Date: 05/08/2024; Time: 2:49 PM  DISCLAIMER: Medicine is not an exact science. It has no guarantees or warranties. The decision to proceed with this intervention was based on the information collected from the patient. Conclusions were drawn from the patient's questionnaire, interview, and examination. Because information was provided in large part by the patient, it cannot be  guaranteed that it has not been purposely or unconsciously manipulated or altered. Every effort has been made to obtain as much accurate, relevant, available data as possible. Always take into account that the treatment will also be dependent on availability of resources and existing treatment guidelines, considered by other Pain Management Specialists as being common knowledge and practice, at the time of the intervention. It is also important to point out that variation in procedural techniques and pharmacological choices are the acceptable norm. For Medico-Legal review purposes, the indications, contraindications, technique, and results of the these procedures should only be evaluated, judged and interpreted by a Board-Certified Interventional Pain Specialist with extensive familiarity and expertise in the same exact procedure and technique.

## 2024-05-08 NOTE — Progress Notes (Signed)
Safety precautions to be maintained throughout the outpatient stay will include: orient to surroundings, keep bed in low position, maintain call bell within reach at all times, provide assistance with transfer out of bed and ambulation. Safety precautions to be maintained throughout the outpatient stay will include: orient to surroundings, keep bed in low position, maintain call bell within reach at all times, provide assistance with transfer out of bed and ambulation.  

## 2024-05-08 NOTE — Progress Notes (Signed)
 PROVIDER NOTE: Interpretation of information contained herein should be left to medically-trained personnel. Specific patient instructions are provided elsewhere under Patient Instructions section of medical record. This document was created in part using STT-dictation technology, any transcriptional errors that may result from this process are unintentional.  Patient: [name] [name] Type: Established DOB: [birth year] MRN: [id number] PCP: Alla Amis, MD  Service: Procedure DOS: 05/08/2024 Setting: Ambulatory Location: Ambulatory outpatient facility Delivery: Face-to-face Provider: Wallie Sherry, MD Specialty: Interventional Pain Management Specialty designation: 09 Location: Outpatient facility Ref. Prov.: Alla Amis, MD       Interventional Therapy   Type: Qutenza  Neurolysis #1  Laterality:  Bilateral Area treated: Feet Imaging Guidance: None Anesthesia/analgesia/anxiolysis/sedation: None required Medication (Right): Qutenza  (capsaicin  8%) topical system Medication (Left): Qutenza  (capsaicin  8%) topical system Date: 05/08/2024 Performed by: Wallie Sherry, MD Rationale (medical necessity): procedure needed and proper for the treatment of [name]'s medical symptoms and needs. Indication: Painful diabetic peripheral neuralgia (DPN) (ICD-10-CM:E11.40) severe enough to impact quality of life or function. 1. Chronic painful diabetic neuropathy (HCC)   2. Bilateral primary osteoarthritis of knee    NAS-11 Pain score:   Pre-procedure: 6 /10   Post-procedure: 6 /10     Position / Prep / Materials:  Position: Supine  Materials: Qutenza  Kit (4 patches)  H&P (Pre-op  Assessment):  [name] [name] is a 83 y.o. (year old), male patient, seen today for interventional treatment. He  has a past surgical history that includes Retinal detachment surgery (Left); Bunion Removal (Right); Hernia repair; Transurethral resection of prostate; Appendectomy; Colonoscopy with propofol  (N/A,  06/14/2015); Prostate biopsy (N/A, 03/27/2017); Prostate biopsy (N/A, 09/11/2019); DIALYSIS/PERMA CATHETER INSERTION (N/A, 10/03/2021); Eye surgery; XI robotic assisted ventral hernia (N/A, 11/23/2021); CAPD insertion (N/A, 11/23/2021); Insertion of mesh (11/23/2021); CAPD revision (N/A, 01/31/2022); CAPD revision (N/A, 04/06/2022); CAPD revision (N/A, 04/14/2022); AV fistula placement (Right, 10/26/2022); UPPER EXTREMITY VENOGRAPHY (Right, 11/20/2022); UPPER EXTREMITY VENOGRAPHY (Right, 11/24/2022); and DIALYSIS/PERMA CATHETER REMOVAL (N/A, 02/12/2023). [name] [name] has a current medication list which includes the following prescription(s): acetaminophen , aspirin ec, cholecalciferol, diclofenac sodium, diphenhydramine , fluticasone , gabapentin , polyethylene glycol, and sodium bicarbonate . His primarily concern today is the Knee Pain (bilateral) and Foot Pain (Numb bilateral -able to drive some since procedures)  Initial Vital Signs:  Pulse/HCG Rate: 81  Temp:   Resp: 16 BP: (!) 146/71 SpO2: 100 %  BMI: Estimated body mass index is 26 kg/m as calculated from the following:   Height as of this encounter: 5' 8 (1.727 m).   Weight as of this encounter: 171 lb (77.6 kg).  Risk Assessment: Allergies: Reviewed. He is allergic to fish allergy, morphine , ciprofloxacin , and doxazosin.  Allergy Precautions: None required Coagulopathies: Reviewed. None identified.  Blood-thinner therapy: None at this time Active Infection(s): Reviewed. None identified. [name] [name] is afebrile  Site Confirmation: [name] [name] was asked to confirm the procedure and laterality before marking the site Procedure checklist: Completed Consent: Before the procedure and under the influence of no sedative(s), amnesic(s), or anxiolytics, the patient was informed of the treatment options, risks and possible complications. To fulfill our ethical and legal obligations, as recommended by the American Medical Association's Code of Ethics, I have  informed the patient of my clinical impression; the nature and purpose of the treatment or procedure; the risks, benefits, and possible complications of the intervention; the alternatives, including doing nothing; the risk(s) and benefit(s) of the alternative treatment(s) or procedure(s); and the risk(s) and benefit(s) of doing nothing. The patient was provided information about the general risks and possible  complications associated with the procedure. These may include, but are not limited to: failure to achieve desired goals, infection, bleeding, organ or nerve damage, allergic reactions, paralysis, and death. In addition, the patient was informed of those risks and complications associated to the procedure, such as failure to decrease pain; infection; bleeding; organ or nerve damage with subsequent damage to sensory, motor, and/or autonomic systems, resulting in permanent pain, numbness, and/or weakness of one or several areas of the body; allergic reactions; (i.e.: anaphylactic reaction); and/or death. Furthermore, the patient was informed of those risks and complications associated with the medications. These include, but are not limited to: allergic reactions (i.e.: anaphylactic or anaphylactoid reaction(s)); adrenal axis suppression; blood sugar elevation that in diabetics may result in ketoacidosis or comma; water retention that in patients with history of congestive heart failure may result in shortness of breath, pulmonary edema, and decompensation with resultant heart failure; weight gain; swelling or edema; medication-induced neural toxicity; particulate matter embolism and blood vessel occlusion with resultant organ, and/or nervous system infarction; and/or aseptic necrosis of one or more joints. Finally, the patient was informed that Medicine is not an exact science; therefore, there is also the possibility of unforeseen or unpredictable risks and/or possible complications that may result in a  catastrophic outcome. The patient indicated having understood very clearly. We have given the patient no guarantees and we have made no promises. Enough time was given to the patient to ask questions, all of which were answered to the patient's satisfaction. Mr. Malburg has indicated that he wanted to continue with the procedure. Attestation: I, the ordering provider, attest that I have discussed with the patient the benefits, risks, side-effects, alternatives, likelihood of achieving goals, and potential problems during recovery for the procedure that I have provided informed consent. Date  Time: 05/08/2024  1:03 PM  Pre-Procedure Preparation:  Monitoring: As per clinic protocol. Respiration, ETCO2, SpO2, BP, heart rate and rhythm monitor placed and checked for adequate function Safety Precautions: Patient was assessed for positional comfort and pressure points before starting the procedure. Time-out: I initiated and conducted the Time-out before starting the procedure, as per protocol. The patient was asked to participate by confirming the accuracy of the Time Out information. Verification of the correct person, site, and procedure were performed and confirmed by me, the nursing staff, and the patient. Time-out conducted as per Joint Commission's Universal Protocol (UP.01.01.01). Time: 1340 Start Time: 1340 hrs.  Description/Narrative of Procedure:          Region: Distal lower extremities Target Area: Sensory peripheral nerves affected by diabetic peripheral neuropathy Site: Feet Approach: Percutaneous  No./Series: Not applicable  Type: Percutaneous  Purpose: Therapeutic  Region: Distal lower extremities  Start Time: 1340 hrs.  Description of the Procedure: Protocol guidelines were followed. The patient was assisted into a comfortable position.  Informed consent was obtained in the patient monitored in the usual manner.  All questions were answered prior to the procedure.  They  Qutenza  patches were applied to the affected area and then covered with the wrap.  The Patient was kept under observation until the treatment was completed.  The patches were removed and the treated area was inspected.  Vitals:   05/08/24 1310  BP: (!) 146/71  Pulse: 81  Resp: 16  SpO2: 100%  Weight: 171 lb (77.6 kg)  Height: 5' 8 (1.727 m)     End Time: 1425 hrs.  Type of Imaging Technique: None used Indication(s): N/A Exposure Time: No patient exposure Contrast:  None used. Fluoroscopic Guidance: N/A Ultrasound Guidance: N/A Interpretation: N/A  Post-operative Assessment:  Post-procedure Vital Signs:  Pulse/HCG Rate: 81  Temp:   Resp: 16 BP: (!) 146/71 SpO2: 100 %  EBL: None  Complications: No immediate post-treatment complications observed by team, or reported by patient.  Note: The patient tolerated the entire procedure well. A repeat set of vitals were taken after the procedure and the patient was kept under observation following institutional policy, for this type of procedure. Post-procedural neurological assessment was performed, showing return to baseline, prior to discharge. The patient was provided with post-procedure discharge instructions, including a section on how to identify potential problems. Should any problems arise concerning this procedure, the patient was given instructions to immediately contact us , at any time, without hesitation. In any case, we plan to contact the patient by telephone for a follow-up status report regarding this interventional procedure.  Comments:  No additional relevant information.  Plan of Care (POC)  Orders:  No orders of the defined types were placed in this encounter.    Medications ordered for procedure: Meds ordered this encounter  Medications   capsaicin  topical system 8 % patch 4 patch   Sodium Hyaluronate (Viscosup) SOSY 20 mg    Do not substitute. Deliver to facility day before procedure.   Sodium Hyaluronate  (Viscosup) SOSY 20 mg    Do not substitute. Deliver to facility day before procedure.   lidocaine  (XYLOCAINE ) 2 % (with pres) injection 400 mg   Medications administered: We administered capsaicin  topical system, Sodium Hyaluronate (Viscosup), Sodium Hyaluronate (Viscosup), and lidocaine .  See the medical record for exact dosing, route, and time of administration.   Follow-up plan:   Return in about 6 weeks (around 06/19/2024) for PPE, VV.     Recent Visits Date Type Provider Dept  02/26/24 Procedure visit Marcelino Nurse, MD [facility]  Showing recent visits within past 90 days and meeting all other requirements Today's Visits Date Type Provider Dept  05/08/24 Procedure visit Marcelino Nurse, MD [facility]  Showing today's visits and meeting all other requirements Future Appointments Date Type Provider Dept  06/19/24 Appointment Marcelino Nurse, MD [facility]  Showing future appointments within next 90 days and meeting all other requirements   Disposition: Discharge home  Discharge (Date  Time): 05/08/2024; 1440 hrs.   Primary Care Physician: Alla Amis, MD Location: [facility] Note by: Nurse Marcelino, MD (TTS technology used. I apologize for any typographical errors that were not detected and corrected.) Date: 05/08/2024; Time: 2:49 PM  Disclaimer:  Medicine is not an Visual merchandiser. The only guarantee in medicine is that nothing is guaranteed. It is important to note that the decision to proceed with this intervention was based on the information collected from the patient. The Data and conclusions were drawn from the patient's questionnaire, the interview, and the physical examination. Because the information was provided in large part by the patient, it cannot be guaranteed that it has not been purposely or unconsciously manipulated. Every effort has been made to obtain as much relevant data as possible for this  evaluation. It is important to note that the conclusions that lead to this procedure are derived in large part from the available data. Always take into account that the treatment will also be dependent on availability of resources and existing treatment guidelines, considered by other Pain Management Practitioners as being common knowledge and practice, at the time of the intervention. For Medico-Legal purposes, it is  also important to point out that variation in procedural techniques and pharmacological choices are the acceptable norm. The indications, contraindications, technique, and results of the above procedure should only be interpreted and judged by a Board-Certified Interventional Pain Specialist with extensive familiarity and expertise in the same exact procedure and technique.

## 2024-05-08 NOTE — Patient Instructions (Signed)

## 2024-06-04 NOTE — Progress Notes (Signed)
 06/04/2024- 3 MIN

## 2024-06-19 ENCOUNTER — Ambulatory Visit
Attending: Student in an Organized Health Care Education/Training Program | Admitting: Student in an Organized Health Care Education/Training Program

## 2024-06-19 ENCOUNTER — Encounter: Payer: Self-pay | Admitting: Student in an Organized Health Care Education/Training Program

## 2024-06-19 VITALS — BP 152/68 | HR 81 | Temp 98.1°F | Ht 68.0 in | Wt 171.0 lb

## 2024-06-19 DIAGNOSIS — E1140 Type 2 diabetes mellitus with diabetic neuropathy, unspecified: Secondary | ICD-10-CM | POA: Insufficient documentation

## 2024-06-19 DIAGNOSIS — M5416 Radiculopathy, lumbar region: Secondary | ICD-10-CM | POA: Insufficient documentation

## 2024-06-19 DIAGNOSIS — M170 Bilateral primary osteoarthritis of knee: Secondary | ICD-10-CM | POA: Insufficient documentation

## 2024-06-19 NOTE — Progress Notes (Signed)
 PROVIDER NOTE: Interpretation of information contained herein should be left to medically-trained personnel. Specific patient instructions are provided elsewhere under Patient Instructions section of medical record. This document was created in part using AI and STT-dictation technology, any transcriptional errors that may result from this process are unintentional.  Patient: [name] [name]  Service: E/M   PCP: Alla Amis, MD  DOB: Feb 12, 1941  DOS: 06/19/2024  Provider: Wallie Sherry, MD  MRN: [id number]  Delivery: Face-to-face  Specialty: Interventional Pain Management  Type: Established Patient  Setting: Ambulatory outpatient facility  Specialty designation: 09  Referring Prov.: Alla Amis, MD  Location: Outpatient office facility       History of present illness (HPI) [name] [name], a 83 y.o. 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 Mr. Manes primary complain today is Knee Pain (Bilateral knees )  Pertinent problems: [name] [name] does not have any pertinent problems on file.  Pain Assessment: Severity of Chronic pain is reported as a 5 /10. Location: Knee Right, Left/denies. Onset: More than a month ago. Quality: Sore. Timing: Intermittent (when walking). Modifying factor(s): laying down. Vitals:  height is 5' 8 (1.727 m) and weight is 171 lb (77.6 kg). His temperature is 98.1 F (36.7 C). His blood pressure is 152/68 (abnormal) and his pulse is 81. His oxygen saturation is 97%.  BMI: Estimated body mass index is 26 kg/m as calculated from the following:   Height as of this encounter: 5' 8 (1.727 m).   Weight as of this encounter: 171 lb (77.6 kg).  Last encounter: 01/10/2024. Last procedure: 05/08/2024.  Reason for encounter:   Discussed the use of AI scribe software for clinical note transcription with the patient, who gave verbal consent to proceed.  History of Present Illness   [name] [name] is an  83 year old male who presents with persistent knee pain and numbness in the feet.  He experiences ongoing knee pain that has slightly improved with movement but remains persistent. The pain extends from the knee to the ankle. He uses a walker to aid in mobility, noting that he can take a few steps without it but still relies on it heavily due to the pain. He has been receiving knee injections. However, he still experiences significant discomfort and is unable to go without his walker.  His feet feel numb and stiff, although they are not painful. He has previously tried a wrap on his feet, which provided some relief, but the numbness persists despite these interventions.         Post-Procedure Evaluation   Type: Qutenza  Neurolysis #1  Laterality:  Bilateral Area treated: Feet Imaging Guidance: None Anesthesia/analgesia/anxiolysis/sedation: None required Medication (Right): Qutenza  (capsaicin  8%) topical system Medication (Left): Qutenza  (capsaicin  8%) topical system Date: 05/08/2024 Performed by: Wallie Sherry, MD Rationale (medical necessity): procedure needed and proper for the treatment of [name]'s medical symptoms and needs. Indication: Painful diabetic peripheral neuralgia (DPN) (ICD-10-CM:E11.40) severe enough to impact quality of life or function. 1. Chronic painful diabetic neuropathy (HCC)   2. Bilateral primary osteoarthritis of knee    NAS-11 Pain score:   Pre-procedure: 6 /10   Post-procedure: 6 /10     Effectiveness:  Initial hour after procedure: 0 %  Subsequent 4-6 hours post-procedure: 25 %  Analgesia past initial 6 hours: 50 %  Ongoing improvement:  Analgesic:  50% Function: Somewhat improved ROM: Somewhat improved   ROS  Constitutional: Denies any fever or chills Gastrointestinal: No reported hemesis, hematochezia,  vomiting, or acute GI distress Musculoskeletal: Knee pain and bilateral feet paresthesias Neurological: No reported episodes of acute  onset apraxia, aphasia, dysarthria, agnosia, amnesia, paralysis, loss of coordination, or loss of consciousness  Medication Review  Cholecalciferol, acetaminophen , aspirin EC, diclofenac Sodium, diphenhydrAMINE , fluticasone , gabapentin , losartan, lovastatin, polyethylene glycol, and sodium bicarbonate   History Review  Allergy: [name] [name] is allergic to fish allergy, morphine , ciprofloxacin , and doxazosin. Drug: [name] [name]  reports no history of drug use. Alcohol:  reports that he does not currently use alcohol. Tobacco:  reports that he has never smoked. He has never used smokeless tobacco. Social: Mr. Maniscalco  reports that he has never smoked. He has never used smokeless tobacco. He reports that he does not currently use alcohol. He reports that he does not use drugs. Medical:  has a past medical history of Anemia, Arthritis, Cancer (HCC), Chronic kidney disease, Diabetes mellitus without complication (HCC), ESRD (end stage renal disease) (HCC), GERD (gastroesophageal reflux disease), Gout, Hyperlipidemia, mixed, Hypertension, Hypokalemia, Renal mass, Secondary hyperparathyroidism of renal origin, Sleep apnea, and Thrombocytopenia. Surgical: [name] [name]  has a past surgical history that includes Retinal detachment surgery (Left); Bunion Removal (Right); Hernia repair; Transurethral resection of prostate; Appendectomy; Colonoscopy with propofol  (N/A, 06/14/2015); Prostate biopsy (N/A, 03/27/2017); Prostate biopsy (N/A, 09/11/2019); DIALYSIS/PERMA CATHETER INSERTION (N/A, 10/03/2021); Eye surgery; XI robotic assisted ventral hernia (N/A, 11/23/2021); CAPD insertion (N/A, 11/23/2021); Insertion of mesh (11/23/2021); CAPD revision (N/A, 01/31/2022); CAPD revision (N/A, 04/06/2022); CAPD revision (N/A, 04/14/2022); AV fistula placement (Right, 10/26/2022); UPPER EXTREMITY VENOGRAPHY (Right, 11/20/2022); UPPER EXTREMITY VENOGRAPHY (Right, 11/24/2022); and DIALYSIS/PERMA CATHETER REMOVAL (N/A, 02/12/2023). Family:  family history includes Leukemia in his brother and father.  Laboratory Chemistry Profile   Renal Lab Results  Component Value Date   BUN 46 (H) 11/11/2023   CREATININE 7.14 (H) 11/11/2023   GFRAA 10 (L) 06/17/2018   GFRNONAA 7 (L) 11/11/2023    Hepatic Lab Results  Component Value Date   AST 16 11/11/2023   ALT 11 11/11/2023   ALBUMIN 3.4 (L) 11/11/2023   ALKPHOS 83 11/11/2023   HCVAB NON REACTIVE 04/07/2022   LIPASE 45 04/13/2022    Electrolytes Lab Results  Component Value Date   NA 138 11/11/2023   K 3.7 11/11/2023   CL 102 11/11/2023   CALCIUM  9.7 11/11/2023   MG 2.1 11/11/2023   PHOS 6.5 (H) 11/22/2022    Bone No results found for: VD25OH, VD125OH2TOT, CI6874NY7, CI7874NY7, 25OHVITD1, 25OHVITD2, 25OHVITD3, TESTOFREE, TESTOSTERONE  Inflammation (CRP: Acute Phase) (ESR: Chronic Phase) No results found for: CRP, ESRSEDRATE, LATICACIDVEN       Note: Above Lab results reviewed.  Recent Imaging Review  VAS US  ABI WITH/WO TBI  LOWER EXTREMITY DOPPLER STUDY  Patient Name:  Daxen Lanum  Date of Exam:   04/29/2024 Medical Rec #: [id number]        Accession #:    [id number] Date of Birth: 1941-07-08        Patient Gender: M Patient Age:   32 years Exam Location:  [facility] Procedure:      VAS US  ABI WITH/WO TBI Referring Phys:  --------------------------------------------------------------------------------   Indications: Rest pain.   Performing Technologist: Jerel Croak RVT    Examination Guidelines: A complete evaluation includes at minimum, Doppler waveform signals and systolic blood pressure reading at the level of bilateral brachial, anterior tibial, and posterior tibial arteries, when vessel segments are accessible. Bilateral testing is considered an integral part of a complete examination. Photoelectric Plethysmograph (PPG) waveforms and  toe systolic pressure readings are included as required and additional duplex  testing as needed. Limited examinations for reoccurring indications may be performed as noted.    ABI Findings: +---------+------------------+-----+---------+--------+ Right    Rt Pressure (mmHg)IndexWaveform Comment  +---------+------------------+-----+---------+--------+ Brachial                                 AVF      +---------+------------------+-----+---------+--------+ PTA      163               1.09 triphasic         +---------+------------------+-----+---------+--------+ DP       162               1.08 triphasic         +---------+------------------+-----+---------+--------+ Great Toe179               1.19 Normal            +---------+------------------+-----+---------+--------+  +---------+------------------+-----+---------+-------+ Left     Lt Pressure (mmHg)IndexWaveform Comment +---------+------------------+-----+---------+-------+ Brachial 150                                     +---------+------------------+-----+---------+-------+ PTA      160               1.07 triphasic        +---------+------------------+-----+---------+-------+ DP       167               1.11 triphasic        +---------+------------------+-----+---------+-------+ Great Toe145               0.97 Abnormal         +---------+------------------+-----+---------+-------+    Summary: Right: Resting right ankle-brachial index is within normal range. The right toe-brachial index is normal.   Left: Resting left ankle-brachial index is within normal range. The left toe-brachial index is normal.    *See table(s) above for measurements and observations.    Electronically signed by Selinda Gu MD on 04/30/2024 at 7:24:36 AM.      Final   Note: Reviewed        Physical Exam  Vitals: BP (!) 152/68   Pulse 81   Temp 98.1 F (36.7 C)   Ht 5' 8 (1.727 m)   Wt 171 lb (77.6 kg)   SpO2 97%   BMI 26.00 kg/m  BMI: Estimated body mass index  is 26 kg/m as calculated from the following:   Height as of this encounter: 5' 8 (1.727 m).   Weight as of this encounter: 171 lb (77.6 kg). Ideal: Ideal body weight: 68.4 kg (150 lb 12.7 oz) Adjusted ideal body weight: 72.1 kg (158 lb 14 oz) General appearance: Well nourished, well developed, and well hydrated. In no apparent acute distress Mental status: Alert, oriented x 3 (person, place, & time)       Respiratory: No evidence of acute respiratory distress Eyes: PERLA  Bilateral knee pain  Bilateral feet paresthesias Assessment   Diagnosis Status  1. Chronic painful diabetic neuropathy (HCC)   2. Bilateral primary osteoarthritis of knee   3. Lumbar radicular pain    Controlled Controlled Controlled   Updated Problems: No problems updated.  Plan of Care  Assessment and Plan    Chronic bilateral knee pain   He experiences chronic bilateral knee  pain with improved mobility but persistent discomfort. He can take a few steps without a walker, showing progress. The condition results from years of wear and tear, and treatment focuses on symptom management and functional improvement. Plan to repeat knee injections at the end of December or beginning of January. If significant discomfort occurs, offer injections before Thanksgiving.  Chronic bilateral foot numbness and stiffness   He has chronic bilateral foot numbness and stiffness with some relief from previous treatment. Numbness persists, but there is no foot pain. The condition is managed with topical treatments to alleviate symptoms. Plan to repeat Qtenza wrap on feet at the end of December or beginning of January.      1. Chronic painful diabetic neuropathy (HCC) (Primary) - NEUROLYSIS; Future  2. Bilateral primary osteoarthritis of knee - KNEE INJECTION; Future  3. Lumbar radicular pain -consider repeating L-ESI prn   Mr. Kaiyan Luczak has a current medication list which includes the following long-term  medication(s): losartan.  Pharmacotherapy (Medications Ordered): No orders of the defined types were placed in this encounter.  Orders:  Orders Placed This Encounter  Procedures   NEUROLYSIS    Please order Qutenza  patches    Standing Status:   Future    Expiration Date:   09/19/2024    Where will this procedure be performed?:   [facility]   KNEE INJECTION    Indications: Knee arthralgia (pain) due to osteoarthritis (OA) Imaging: None (CPT-20610) Position: Sitting Equipment/Materials: Block tray  1.5, 25-G (one per side)  Local anesthetic  Hyalgan (one per side)    Standing Status:   Future    Expected Date:   08/26/2024    Expiration Date:   06/19/2025    Scheduling Instructions:     Procedure: Knee injection Hyalgan (Hyaluronan/Hyaluronic acid)     Treatment No.:  4          Level: Intra-articular     Laterality: Bilateral Knee     Sedation: Patient's choice.    Where will this procedure be performed?:   [facility]     Left L4-L5 ESI 02/26/2024, bilateral knee Hyalgan No. 3 05/08/2024, Qutenza  No. 1 05/08/2024   Return in about 2 months (around 08/18/2024) for Qutenza  #2 and B/L hyalgan.    Recent Visits Date Type Provider Dept  05/08/24 Procedure visit Marcelino Nurse, MD [facility]  Showing recent visits within past 90 days and meeting all other requirements Today's Visits Date Type Provider Dept  06/19/24 Office Visit Marcelino Nurse, MD [facility]  Showing today's visits and meeting all other requirements Future Appointments Date Type Provider Dept  08/28/24 Appointment Marcelino Nurse, MD [facility]  Showing future appointments within next 90 days and meeting all other requirements  I discussed the assessment and treatment plan with the patient. The patient was provided an opportunity to ask questions and all were answered. The patient agreed with the plan and demonstrated an understanding of the  instructions.  Patient advised to call back or seek an in-person evaluation if the symptoms or condition worsens.  I personally spent a total of 30 minutes in the care of the patient today including preparing to see the patient, getting/reviewing separately obtained history, performing a medically appropriate exam/evaluation, counseling and educating, placing orders, and documenting clinical information in the EHR.   Note by: Nurse Marcelino, MD (TTS and AI technology used. I apologize for any typographical errors that were not detected and corrected.) Date: 06/19/2024; Time: 3:31 PM

## 2024-06-19 NOTE — Progress Notes (Signed)
 Safety precautions to be maintained throughout the outpatient stay will include: orient to surroundings, keep bed in low position, maintain call bell within reach at all times, provide assistance with transfer out of bed and ambulation.

## 2024-08-21 ENCOUNTER — Encounter: Payer: Self-pay | Admitting: Oncology

## 2024-08-28 ENCOUNTER — Encounter: Payer: Self-pay | Admitting: Student in an Organized Health Care Education/Training Program

## 2024-08-28 ENCOUNTER — Ambulatory Visit
Attending: Student in an Organized Health Care Education/Training Program | Admitting: Student in an Organized Health Care Education/Training Program

## 2024-08-28 VITALS — BP 164/75 | HR 74 | Temp 98.1°F | Resp 16 | Ht 72.0 in | Wt 171.0 lb

## 2024-08-28 DIAGNOSIS — E1140 Type 2 diabetes mellitus with diabetic neuropathy, unspecified: Secondary | ICD-10-CM | POA: Insufficient documentation

## 2024-08-28 DIAGNOSIS — M79604 Pain in right leg: Secondary | ICD-10-CM | POA: Insufficient documentation

## 2024-08-28 DIAGNOSIS — M79605 Pain in left leg: Secondary | ICD-10-CM | POA: Diagnosis present

## 2024-08-28 DIAGNOSIS — M170 Bilateral primary osteoarthritis of knee: Secondary | ICD-10-CM | POA: Diagnosis present

## 2024-08-28 MED ORDER — SODIUM HYALURONATE (VISCOSUP) 20 MG/2ML IX SOSY
2.0000 mL | PREFILLED_SYRINGE | Freq: Once | INTRA_ARTICULAR | Status: AC
  Administered 2024-08-28: 20 mg via INTRA_ARTICULAR

## 2024-08-28 MED ORDER — LIDOCAINE HCL 2 % IJ SOLN
INTRAMUSCULAR | Status: AC
  Filled 2024-08-28: qty 20

## 2024-08-28 MED ORDER — CAPSAICIN-CLEANSING GEL 8 % EX KIT
4.0000 | PACK | Freq: Once | CUTANEOUS | Status: AC
  Administered 2024-08-28: 4 via TOPICAL

## 2024-08-28 NOTE — Progress Notes (Signed)
 Safety precautions to be maintained throughout the outpatient stay will include: orient to surroundings, keep bed in low position, maintain call bell within reach at all times, provide assistance with transfer out of bed and ambulation.

## 2024-08-28 NOTE — Progress Notes (Signed)
 PROVIDER NOTE: Interpretation of information contained herein should be left to medically-trained personnel. Specific patient instructions are provided elsewhere under Patient Instructions section of medical record. This document was created in part using STT-dictation technology, any transcriptional errors that may result from this process are unintentional.  Patient: [name] [name] Type: Established DOB: 1940/10/22 MRN: [id number] PCP: Alla Amis, MD  Service: Procedure DOS: 08/28/2024 Setting: Ambulatory Location: Ambulatory outpatient facility Delivery: Face-to-face Provider: Wallie Sherry, MD Specialty: Interventional Pain Management Specialty designation: 09 Location: Outpatient facility Ref. Prov.: Alla Amis, MD       Interventional Therapy   Type:  Hyalgan Intra-articular Knee Injection #4  Laterality: Bilateral (-50) Level/approach: Medial Imaging guidance: None required ([id number]) Anesthesia: Local anesthesia (1-2% Lidocaine ) DOS: 08/28/2024  Performed by: Wallie Sherry, MD  Purpose: Diagnostic/Therapeutic Indications: Knee arthralgia associated to osteoarthritis of the knee 1. Chronic painful diabetic neuropathy (HCC)   2. Bilateral primary osteoarthritis of knee   3. Bilateral leg pain    NAS-11 score:   Pre-procedure: 5 /10   Post-procedure: 5 /10     Pre-Procedure Preparation  Monitoring: As per clinic protocol.  Risk Assessment: Vitals:  AFP:Zdupfjuzi body mass index is 23.19 kg/m as calculated from the following:   Height as of this encounter: 6' (1.829 m).   Weight as of this encounter: 171 lb (77.6 kg)., Rate:74 , BP:(!) 164/75, Resp:16, Temp:98.1 F (36.7 C), SpO2:99 %  Allergies: He is allergic to fish allergy, morphine , ciprofloxacin , and doxazosin.  Precautions: No additional precautions required  Blood-thinner(s): None at this time  Coagulopathies: Reviewed. None identified.   Active Infection(s): Reviewed. None identified. Mr.  [name] is afebrile   Location setting: Exam room Position: Sitting w/ knee bent 90 degrees Safety Precautions: Patient was assessed for positional comfort and pressure points before starting the procedure. Prepping solution: DuraPrep (Iodine  Povacrylex [0.7% available iodine ] and Isopropyl Alcohol, 74% w/w) Prep Area: Entire knee region Approach: percutaneous, just above the tibial plateau, lateral to the infrapatellar tendon. Intended target: Intra-articular knee space Materials: Tray: Block Needle(s): Regular Qty: 1/side Length: 1.5-inch Gauge: 25G (x1) + 22G (x1)  Meds ordered this encounter  Medications   capsaicin  topical system 8 % patch 4 patch   Sodium Hyaluronate (Viscosup) SOSY 20 mg    Do not substitute. Deliver to facility day before procedure.   Sodium Hyaluronate (Viscosup) SOSY 20 mg    Do not substitute. Deliver to facility day before procedure.    Orders Placed This Encounter  Procedures   KNEE INJECTION    Indications: Knee arthralgia (pain) due to osteoarthritis (OA) Imaging: None (CPT-20610) Position: Sitting Equipment/Materials: Block tray  1.5, 25-G (one per side)  Local anesthetic  Hyalgan (one per side)    Standing Status:   Standing    Number of Occurrences:   4    Next Expected Occurrence:   11/14/2024    Expiration Date:   08/28/2025    Scheduling Instructions:     Procedure: Knee injection Hyalgan (Hyaluronan/Hyaluronic acid)     Treatment No.:  3          Level: Intra-articular     Laterality: Bilateral Knee     Sedation: Patient's choice.    Where will this procedure be performed?:   [facility]   NEUROLYSIS    Please order Qutenza  patches    Standing Status:   Standing    Number of Occurrences:   4    Next Expected Occurrence:   11/14/2024    Expiration Date:  08/28/2025    Where will this procedure be performed?:   [facility]     Time-out: 1010 (knees) I initiated and conducted the Time-out before starting the  procedure, as per protocol. The patient was asked to participate by confirming the accuracy of the Time Out information. Verification of the correct person, site, and procedure were performed and confirmed by me, the nursing staff, and the patient. Time-out conducted as per Joint Commission's Universal Protocol (UP.01.01.01). Procedure checklist: Completed  H&P (Pre-op   Assessment)  [name] [name] is a 84 y.o. (year old), male patient, seen today for interventional treatment. He  has a past surgical history that includes Retinal detachment surgery (Left); Bunion Removal (Right); Hernia repair; Transurethral resection of prostate; Appendectomy; Colonoscopy with propofol  (N/A, 06/14/2015); Prostate biopsy (N/A, 03/27/2017); Prostate biopsy (N/A, 09/11/2019); DIALYSIS/PERMA CATHETER INSERTION (N/A, 10/03/2021); Eye surgery; XI robotic assisted ventral hernia (N/A, 11/23/2021); CAPD insertion (N/A, 11/23/2021); Insertion of mesh (11/23/2021); CAPD revision (N/A, 01/31/2022); CAPD revision (N/A, 04/06/2022); CAPD revision (N/A, 04/14/2022); AV fistula placement (Right, 10/26/2022); UPPER EXTREMITY VENOGRAPHY (Right, 11/20/2022); UPPER EXTREMITY VENOGRAPHY (Right, 11/24/2022); and DIALYSIS/PERMA CATHETER REMOVAL (N/A, 02/12/2023). [name] [name] has a current medication list which includes the following prescription(s): acetaminophen , aspirin ec, cholecalciferol, diclofenac sodium, diphenhydramine , fluticasone , gabapentin , losartan, lovastatin, polyethylene glycol, and sodium bicarbonate . His primarily concern today is the Foot Pain (bilateral) and Knee Pain (Bilateral 6/10)  He is allergic to fish allergy, morphine , ciprofloxacin , and doxazosin.   Last encounter: My last encounter with him was on 06/19/2024. Pertinent problems: [name] [name] does not have any pertinent problems on file. Pain Assessment: Severity of Chronic pain is reported as a 5 /10. Location: Foot Right, Left/denies. Onset: More than a month ago. Quality:  Aching, Burning, Constant, Nagging, Numbness. Timing: Constant. Modifying factor(s): Qutenza , rest. Vitals:  height is 6' (1.829 m) and weight is 171 lb (77.6 kg). His temperature is 98.1 F (36.7 C). His blood pressure is 164/75 (abnormal) and his pulse is 74. His respiration is 16 and oxygen saturation is 99%.   Reason for encounter: interventional pain management therapy due pain of at least four (4) weeks in duration, with failure to respond and/or inability to tolerate more conservative care.  Site Confirmation: Mr. Gibeault was asked to confirm the procedure and laterality before marking the site.  Consent: Before the procedure and under the influence of no sedative(s), amnesic(s), or anxiolytics, the patient was informed of the treatment options, risks and possible complications. To fulfill our ethical and legal obligations, as recommended by the American Medical Association's Code of Ethics, I have informed the patient of my clinical impression; the nature and purpose of the treatment or procedure; the risks, benefits, and possible complications of the intervention; the alternatives, including doing nothing; the risk(s) and benefit(s) of the alternative treatment(s) or procedure(s); and the risk(s) and benefit(s) of doing nothing. The patient was provided information about the general risks and possible complications associated with the procedure. These may include, but are not limited to: failure to achieve desired goals, infection, bleeding, organ or nerve damage, allergic reactions, paralysis, and death. In addition, the patient was informed of those risks and complications associated to Spine-related procedures, such as failure to decrease pain; infection (i.e.: Meningitis, epidural or intraspinal abscess); bleeding (i.e.: epidural hematoma, subarachnoid hemorrhage, or any other type of intraspinal or peri-dural bleeding); organ or nerve damage (i.e.: Any type of peripheral nerve, nerve root,  or spinal cord injury) with subsequent damage to sensory, motor, and/or autonomic systems, resulting in  permanent pain, numbness, and/or weakness of one or several areas of the body; allergic reactions; (i.e.: anaphylactic reaction); and/or death. Furthermore, the patient was informed of those risks and complications associated with the medications. These include, but are not limited to: allergic reactions (i.e.: anaphylactic or anaphylactoid reaction(s)); adrenal axis suppression; blood sugar elevation that in diabetics may result in ketoacidosis or comma; water retention that in patients with history of congestive heart failure may result in shortness of breath, pulmonary edema, and decompensation with resultant heart failure; weight gain; swelling or edema; medication-induced neural toxicity; particulate matter embolism and blood vessel occlusion with resultant organ, and/or nervous system infarction; and/or aseptic necrosis of one or more joints. Finally, the patient was informed that Medicine is not an exact science; therefore, there is also the possibility of unforeseen or unpredictable risks and/or possible complications that may result in a catastrophic outcome. The patient indicated having understood very clearly. We have given the patient no guarantees and we have made no promises. Enough time was given to the patient to ask questions, all of which were answered to the patient's satisfaction. Mr. Aguila has indicated that he wanted to continue with the procedure. Attestation: I, the ordering provider, attest that I have discussed with the patient the benefits, risks, side-effects, alternatives, likelihood of achieving goals, and potential problems during recovery for the procedure that I have provided informed consent.  Date  Time: 08/28/2024  9:28 AM  Description of procedure  Start Time: 1015 (feet) hrs  Local Anesthesia: Once the patient was positioned, prepped, and time-out was completed.  The target area was identified located. The skin was marked with an approved surgical skin marker. Once marked, the skin (epidermis, dermis, and hypodermis), and deeper tissues (fat, connective tissue and muscle) were infiltrated with a small amount of a short-acting local anesthetic, loaded on a 10cc syringe with a 25G, 1.5-in  Needle. An appropriate amount of time was allowed for local anesthetics to take effect before proceeding to the next step. Local Anesthetic: Lidocaine  1-2% The unused portion of the local anesthetic was discarded in the proper designated containers. Safety Precautions: Aspiration looking for blood return was conducted prior to all injections. At no point did I inject any substances, as a needle was being advanced. Before injecting, the patient was told to immediately notify me if he was experiencing any new onset of ringing in the ears, or metallic taste in the mouth. No attempts were made at seeking any paresthesias. Safe injection practices and needle disposal techniques used. Medications properly checked for expiration dates. SDV (single dose vial) medications used. After the completion of the procedure, all disposable equipment used was discarded in the proper designated medical waste containers.  Technical description: Protocol guidelines were followed. After positioning, the target area was identified and prepped in the usual manner. Skin & deeper tissues infiltrated with local anesthetic. Appropriate amount of time allowed to pass for local anesthetics to take effect. Proper needle placement secured. Once satisfactory needle placement was confirmed, I proceeded to inject the desired solution in slow, incremental fashion, intermittently assessing for discomfort or any signs of abnormal or undesired spread of substance. Once completed, the needle was removed and disposed of, as per hospital protocols. The area was cleaned, making sure to leave some of the prepping solution back  to take advantage of its long term bactericidal properties.  Aspiration:  Negative        Vitals:   08/28/24 0944  BP: (!) 164/75  Pulse: 74  Resp: 16  Temp: 98.1 F (36.7 C)  SpO2: 99%  Weight: 171 lb (77.6 kg)  Height: 6' (1.829 m)     End Time: 1045 hrs  Imaging guidance  Imaging-assisted Technique: None required. Indication(s): N/A Exposure Time: N/A Contrast: None Fluoroscopic Guidance: N/A Ultrasound Guidance: N/A Interpretation: N/A  Post-op assessment  Post-procedure Vital Signs:  Pulse/HCG Rate: 74  Temp: 98.1 F (36.7 C) Resp: 16 BP: (!) 164/75 SpO2: 99 %  EBL: None  Complications: No immediate post-treatment complications observed by team, or reported by patient.  Note: The patient tolerated the entire procedure well. A repeat set of vitals were taken after the procedure and the patient was kept under observation following institutional policy, for this type of procedure. Post-procedural neurological assessment was performed, showing return to baseline, prior to discharge. The patient was provided with post-procedure discharge instructions, including a section on how to identify potential problems. Should any problems arise concerning this procedure, the patient was given instructions to immediately contact us , at any time, without hesitation. In any case, we plan to contact the patient by telephone for a follow-up status report regarding this interventional procedure.  Comments:  No additional relevant information.   Medications administered: We administered capsaicin  topical system, Sodium Hyaluronate (Viscosup), and Sodium Hyaluronate (Viscosup).   Follow-up plan:   Return in about 3 months (around 11/26/2024) for B/L Hyalgan #3 and Qutenza  #3.     Recent Visits Date Type Provider Dept  06/19/24 Office Visit Marcelino Nurse, MD [facility]  Showing recent visits within past 90 days and meeting all other requirements Today's  Visits Date Type Provider Dept  08/28/24 Procedure visit Marcelino Nurse, MD [facility]  Showing today's visits and meeting all other requirements Future Appointments Date Type Provider Dept  11/24/24 Appointment Marcelino Nurse, MD [facility]  Showing future appointments within next 90 days and meeting all other requirements   Disposition: Discharge home  Discharge (Date  Time): 08/28/2024; 1055 hrs.   Primary Care Physician: Alla Amis, MD Location: [facility] Note by: Nurse Marcelino, MD Date: 08/28/2024; Time: 11:07 AM  DISCLAIMER: Medicine is not an exact science. It has no guarantees or warranties. The decision to proceed with this intervention was based on the information collected from the patient. Conclusions were drawn from the patient's questionnaire, interview, and examination. Because information was provided in large part by the patient, it cannot be guaranteed that it has not been purposely or unconsciously manipulated or altered. Every effort has been made to obtain as much accurate, relevant, available data as possible. Always take into account that the treatment will also be dependent on availability of resources and existing treatment guidelines, considered by other Pain Management Specialists as being common knowledge and practice, at the time of the intervention. It is also important to point out that variation in procedural techniques and pharmacological choices are the acceptable norm. For Medico-Legal review purposes, the indications, contraindications, technique, and results of the these procedures should only be evaluated, judged and interpreted by a Board-Certified Interventional Pain Specialist with extensive familiarity and expertise in the same exact procedure and technique.

## 2024-08-28 NOTE — Patient Instructions (Signed)

## 2024-08-28 NOTE — Progress Notes (Signed)
 PROVIDER NOTE: Interpretation of information contained herein should be left to medically-trained personnel. Specific patient instructions are provided elsewhere under Patient Instructions section of medical record. This document was created in part using STT-dictation technology, any transcriptional errors that may result from this process are unintentional.  Patient: [name] [name] Type: Established DOB: Jan 19, 1941 MRN: [id number] PCP: Alla Amis, MD  Service: Procedure DOS: 08/28/2024 Setting: Ambulatory Location: Ambulatory outpatient facility Delivery: Face-to-face Provider: Wallie Sherry, MD Specialty: Interventional Pain Management Specialty designation: 09 Location: Outpatient facility Ref. Prov.: Alla Amis, MD       Interventional Therapy   Type: Qutenza  Neurolysis #2  Laterality:  Bilateral Area treated: Feet Imaging Guidance: None Anesthesia/analgesia/anxiolysis/sedation: None required Medication (Right): Qutenza  (capsaicin  8%) topical system Medication (Left): Qutenza  (capsaicin  8%) topical system Date: 08/28/2024 Performed by: Wallie Sherry, MD Rationale (medical necessity): procedure needed and proper for the treatment of Mr. Neale's medical symptoms and needs. Indication: Painful diabetic peripheral neuralgia (DPN) (ICD-10-CM:E11.40) severe enough to impact quality of life or function. 1. Chronic painful diabetic neuropathy (HCC)   2. Bilateral primary osteoarthritis of knee   3. Bilateral leg pain    NAS-11 Pain score:   Pre-procedure: 5 /10   Post-procedure: 5 /10     Position / Prep / Materials:  Position: Supine  Materials: Qutenza  Kit (4 patches)  H&P (Pre-op  Assessment):  [name] [name] is a 84 y.o. (year old), male patient, seen today for interventional treatment. He  has a past surgical history that includes Retinal detachment surgery (Left); Bunion Removal (Right); Hernia repair; Transurethral resection of prostate; Appendectomy;  Colonoscopy with propofol  (N/A, 06/14/2015); Prostate biopsy (N/A, 03/27/2017); Prostate biopsy (N/A, 09/11/2019); DIALYSIS/PERMA CATHETER INSERTION (N/A, 10/03/2021); Eye surgery; XI robotic assisted ventral hernia (N/A, 11/23/2021); CAPD insertion (N/A, 11/23/2021); Insertion of mesh (11/23/2021); CAPD revision (N/A, 01/31/2022); CAPD revision (N/A, 04/06/2022); CAPD revision (N/A, 04/14/2022); AV fistula placement (Right, 10/26/2022); UPPER EXTREMITY VENOGRAPHY (Right, 11/20/2022); UPPER EXTREMITY VENOGRAPHY (Right, 11/24/2022); and DIALYSIS/PERMA CATHETER REMOVAL (N/A, 02/12/2023). [name] [name] has a current medication list which includes the following prescription(s): acetaminophen , aspirin ec, cholecalciferol, diclofenac sodium, diphenhydramine , fluticasone , gabapentin , losartan, lovastatin, polyethylene glycol, and sodium bicarbonate . His primarily concern today is the Foot Pain (bilateral) and Knee Pain (Bilateral 6/10)  Initial Vital Signs:  Pulse/HCG Rate: 74  Temp: 98.1 F (36.7 C) Resp: 16 BP: (!) 164/75 SpO2: 99 %  BMI: Estimated body mass index is 23.19 kg/m as calculated from the following:   Height as of this encounter: 6' (1.829 m).   Weight as of this encounter: 171 lb (77.6 kg).  Risk Assessment: Allergies: Reviewed. He is allergic to fish allergy, morphine , ciprofloxacin , and doxazosin.  Allergy Precautions: None required Coagulopathies: Reviewed. None identified.  Blood-thinner therapy: None at this time Active Infection(s): Reviewed. None identified. [name] [name] is afebrile  Site Confirmation: [name] [name] was asked to confirm the procedure and laterality before marking the site Procedure checklist: Completed Consent: Before the procedure and under the influence of no sedative(s), amnesic(s), or anxiolytics, the patient was informed of the treatment options, risks and possible complications. To fulfill our ethical and legal obligations, as recommended by the American Medical  Association's Code of Ethics, I have informed the patient of my clinical impression; the nature and purpose of the treatment or procedure; the risks, benefits, and possible complications of the intervention; the alternatives, including doing nothing; the risk(s) and benefit(s) of the alternative treatment(s) or procedure(s); and the risk(s) and benefit(s) of doing nothing. The patient was provided information about the general  risks and possible complications associated with the procedure. These may include, but are not limited to: failure to achieve desired goals, infection, bleeding, organ or nerve damage, allergic reactions, paralysis, and death. In addition, the patient was informed of those risks and complications associated to the procedure, such as failure to decrease pain; infection; bleeding; organ or nerve damage with subsequent damage to sensory, motor, and/or autonomic systems, resulting in permanent pain, numbness, and/or weakness of one or several areas of the body; allergic reactions; (i.e.: anaphylactic reaction); and/or death. Furthermore, the patient was informed of those risks and complications associated with the medications. These include, but are not limited to: allergic reactions (i.e.: anaphylactic or anaphylactoid reaction(s)); adrenal axis suppression; blood sugar elevation that in diabetics may result in ketoacidosis or comma; water retention that in patients with history of congestive heart failure may result in shortness of breath, pulmonary edema, and decompensation with resultant heart failure; weight gain; swelling or edema; medication-induced neural toxicity; particulate matter embolism and blood vessel occlusion with resultant organ, and/or nervous system infarction; and/or aseptic necrosis of one or more joints. Finally, the patient was informed that Medicine is not an exact science; therefore, there is also the possibility of unforeseen or unpredictable risks and/or possible  complications that may result in a catastrophic outcome. The patient indicated having understood very clearly. We have given the patient no guarantees and we have made no promises. Enough time was given to the patient to ask questions, all of which were answered to the patient's satisfaction. Mr. Alexopoulos has indicated that he wanted to continue with the procedure. Attestation: I, the ordering provider, attest that I have discussed with the patient the benefits, risks, side-effects, alternatives, likelihood of achieving goals, and potential problems during recovery for the procedure that I have provided informed consent. Date  Time: 08/28/2024  9:28 AM  Pre-Procedure Preparation:  Monitoring: As per clinic protocol. Respiration, ETCO2, SpO2, BP, heart rate and rhythm monitor placed and checked for adequate function Safety Precautions: Patient was assessed for positional comfort and pressure points before starting the procedure. Time-out: I initiated and conducted the Time-out before starting the procedure, as per protocol. The patient was asked to participate by confirming the accuracy of the Time Out information. Verification of the correct person, site, and procedure were performed and confirmed by me, the nursing staff, and the patient. Time-out conducted as per Joint Commission's Universal Protocol (UP.01.01.01). Time: 1010 (knees) Start Time: 1015 (feet) hrs.  Description/Narrative of Procedure:          Region: Distal lower extremities Target Area: Sensory peripheral nerves affected by diabetic peripheral neuropathy Site: Feet Approach: Percutaneous  No./Series: Not applicable  Type: Percutaneous  Purpose: Therapeutic  Region: Distal lower extremities  Start Time: 1015 (feet) hrs.  Description of the Procedure: Protocol guidelines were followed. The patient was assisted into a comfortable position.  Informed consent was obtained in the patient monitored in the usual manner.  All  questions were answered prior to the procedure.  They Qutenza  patches were applied to the affected area and then covered with the wrap.  The Patient was kept under observation until the treatment was completed.  The patches were removed and the treated area was inspected.  Vitals:   08/28/24 0944  BP: (!) 164/75  Pulse: 74  Resp: 16  Temp: 98.1 F (36.7 C)  SpO2: 99%  Weight: 171 lb (77.6 kg)  Height: 6' (1.829 m)     End Time: 1045 hrs.  Type of Imaging  Technique: None used Indication(s): N/A Exposure Time: No patient exposure Contrast: None used. Fluoroscopic Guidance: N/A Ultrasound Guidance: N/A Interpretation: N/A  Post-operative Assessment:  Post-procedure Vital Signs:  Pulse/HCG Rate: 74  Temp: 98.1 F (36.7 C) Resp: 16 BP: (!) 164/75 SpO2: 99 %  EBL: None  Complications: No immediate post-treatment complications observed by team, or reported by patient.  Note: The patient tolerated the entire procedure well. A repeat set of vitals were taken after the procedure and the patient was kept under observation following institutional policy, for this type of procedure. Post-procedural neurological assessment was performed, showing return to baseline, prior to discharge. The patient was provided with post-procedure discharge instructions, including a section on how to identify potential problems. Should any problems arise concerning this procedure, the patient was given instructions to immediately contact us , at any time, without hesitation. In any case, we plan to contact the patient by telephone for a follow-up status report regarding this interventional procedure.  Comments:  No additional relevant information.  Plan of Care (POC)  Orders:  Orders Placed This Encounter  Procedures   KNEE INJECTION    Indications: Knee arthralgia (pain) due to osteoarthritis (OA) Imaging: None (CPT-20610) Position: Sitting Equipment/Materials: Block tray  1.5, 25-G (one per side)   Local anesthetic  Hyalgan (one per side)    Standing Status:   Standing    Number of Occurrences:   4    Next Expected Occurrence:   11/14/2024    Expiration Date:   08/28/2025    Scheduling Instructions:     Procedure: Knee injection Hyalgan (Hyaluronan/Hyaluronic acid)     Treatment No.:  3          Level: Intra-articular     Laterality: Bilateral Knee     Sedation: Patient's choice.    Where will this procedure be performed?:   [facility]   NEUROLYSIS    Please order Qutenza  patches    Standing Status:   Standing    Number of Occurrences:   4    Next Expected Occurrence:   11/14/2024    Expiration Date:   08/28/2025    Where will this procedure be performed?:   [facility]     Medications ordered for procedure: Meds ordered this encounter  Medications   capsaicin  topical system 8 % patch 4 patch   Sodium Hyaluronate (Viscosup) SOSY 20 mg    Do not substitute. Deliver to facility day before procedure.   Sodium Hyaluronate (Viscosup) SOSY 20 mg    Do not substitute. Deliver to facility day before procedure.   Medications administered: We administered capsaicin  topical system, Sodium Hyaluronate (Viscosup), and Sodium Hyaluronate (Viscosup).  See the medical record for exact dosing, route, and time of administration.   Follow-up plan:   Return in about 3 months (around 11/26/2024) for B/L Hyalgan #3 and Qutenza  #3.     Recent Visits Date Type Provider Dept  06/19/24 Office Visit Marcelino Nurse, MD [facility]  Showing recent visits within past 90 days and meeting all other requirements Today's Visits Date Type Provider Dept  08/28/24 Procedure visit Marcelino Nurse, MD [facility]  Showing today's visits and meeting all other requirements Future Appointments Date Type Provider Dept  11/24/24 Appointment Marcelino Nurse, MD [facility]  Showing future appointments within next 90 days and meeting all other  requirements   Disposition: Discharge home  Discharge (Date  Time): 08/28/2024; 1055 hrs.   Primary Care Physician: Alla Amis, MD  Location: [facility] Note by: Wallie Sherry, MD (TTS technology used. I apologize for any typographical errors that were not detected and corrected.) Date: 08/28/2024; Time: 11:07 AM  Disclaimer:  Medicine is not an visual merchandiser. The only guarantee in medicine is that nothing is guaranteed. It is important to note that the decision to proceed with this intervention was based on the information collected from the patient. The Data and conclusions were drawn from the patient's questionnaire, the interview, and the physical examination. Because the information was provided in large part by the patient, it cannot be guaranteed that it has not been purposely or unconsciously manipulated. Every effort has been made to obtain as much relevant data as possible for this evaluation. It is important to note that the conclusions that lead to this procedure are derived in large part from the available data. Always take into account that the treatment will also be dependent on availability of resources and existing treatment guidelines, considered by other Pain Management Practitioners as being common knowledge and practice, at the time of the intervention. For Medico-Legal purposes, it is also important to point out that variation in procedural techniques and pharmacological choices are the acceptable norm. The indications, contraindications, technique, and results of the above procedure should only be interpreted and judged by a Board-Certified Interventional Pain Specialist with extensive familiarity and expertise in the same exact procedure and technique.

## 2024-09-03 NOTE — Progress Notes (Signed)
 "    09/04/2024 3:06 PM   [name] [name] 1941-06-13 [id number]  Referring provider: Alla Amis, MD [phone] [facility] [address] [facility] [place],  [place] [address]  Urological history: 1. Prostate cancer - PSA pending  - prostate biopsy (2021) Gleason 4 + 3   2. Renal mass - renal MRI (2023) The complex interpolar left renal lesion measures similar to slightly larger compared to 02/24/2021. This remains indeterminate on this noncontrast exam. Favored to represent a hemorrhagic/proteinaceous cyst.  Chief Complaint  Patient presents with   Follow-up   Renal mass   HPI: [name] [name] is a 84 y.o. man who presents today for renal mass.   Previous records reviewed.  He was last seen in our office by Dr. Penne back in 2023 where at that time the renal mass favored a hemorrhagic proteinaceous cyst within the pelvic kidney and a repeat MRI was suggested in 1 year.  He has not had an MRI since.  He is no longer making urine.   Patient denies any modifying or aggravating factors.  Patient denies any recent UTI's, gross hematuria, dysuria or suprapubic/flank pain.  Patient denies any fevers, chills, nausea or vomiting.    He also states that he has 2 lumps 1 on his left shoulder and 1 in his left groin area that have been there for some time and he is wondering what to do about these.  He states they sliced open the 1 in the groin area several years ago, but it is come back.  PVR 6 mL   PSA pending   ESRD on dialysis   PMH: Past Medical History:  Diagnosis Date   Anemia    Arthritis    Cancer (HCC)    Chronic kidney disease    Diabetes mellitus without complication (HCC)    ESRD (end stage renal disease) (HCC)    GERD (gastroesophageal reflux disease)    h/o   Gout    Hyperlipidemia, mixed    Hypertension    Hypokalemia    Renal mass    Secondary hyperparathyroidism of renal origin    Sleep apnea    h/o no cpap   Thrombocytopenia     Surgical  History: Past Surgical History:  Procedure Laterality Date   APPENDECTOMY     AV FISTULA PLACEMENT Right 10/26/2022   Procedure: ARTERIOVENOUS (AV) FISTULA CREATION;  Surgeon: Marea Selinda RAMAN, MD;  Location: [facility];  Service: Vascular;  Laterality: Right;   Bunion Removal Right    CAPD INSERTION N/A 11/23/2021   Procedure: LAPAROSCOPIC INSERTION CONTINUOUS AMBULATORY PERITONEAL DIALYSIS  (CAPD) CATHETER;  Surgeon: Desiderio Schanz, MD;  Location: [facility];  Service: General;  Laterality: N/A;   CAPD REVISION N/A 01/31/2022   Procedure: LAPAROSCOPIC REVISION CONTINUOUS AMBULATORY PERITONEAL DIALYSIS  (CAPD) CATHETER;  Surgeon: Desiderio Schanz, MD;  Location: [facility];  Service: General;  Laterality: N/A;   CAPD REVISION N/A 04/06/2022   Procedure: LAPAROSCOPIC REVISION CONTINUOUS AMBULATORY PERITONEAL DIALYSIS  (CAPD) CATHETER, replacement;  Surgeon: Desiderio Schanz, MD;  Location: [facility];  Service: General;  Laterality: N/A;   CAPD REVISION N/A 04/14/2022   Procedure: LAPAROSCOPIC ASSISTED CONTINUOUS AMBULATORY PERITONEAL DIALYSIS  (CAPD) CATHETER REMOVAL;  Surgeon: Desiderio Schanz, MD;  Location: [facility];  Service: General;  Laterality: N/A;   COLONOSCOPY WITH PROPOFOL  N/A 06/14/2015   Procedure: COLONOSCOPY WITH PROPOFOL ;  Surgeon: Gladis RAYMOND Mariner, MD;  Location: [facility];  Service: Endoscopy;  Laterality: N/A;   DIALYSIS/PERMA CATHETER INSERTION N/A 10/03/2021  Procedure: DIALYSIS/PERMA CATHETER INSERTION;  Surgeon: Marea Selinda RAMAN, MD;  Location: [facility];  Service: Cardiovascular;  Laterality: N/A;   DIALYSIS/PERMA CATHETER REMOVAL N/A 02/12/2023   Procedure: DIALYSIS/PERMA CATHETER REMOVAL;  Surgeon: Marea Selinda RAMAN, MD;  Location: [facility];  Service: Cardiovascular;  Laterality: N/A;   EYE SURGERY     HERNIA REPAIR     abd   INSERTION OF MESH  11/23/2021   Procedure: INSERTION OF MESH;  Surgeon: Desiderio Schanz, MD;  Location: [facility];  Service: General;;   PROSTATE BIOPSY  N/A 03/27/2017   Procedure: PROSTATE BIOPSY-URO NAV;  Surgeon: Kassie Ozell SAUNDERS, MD;  Location: [facility];  Service: Urology;  Laterality: N/A;   PROSTATE BIOPSY N/A 09/11/2019   Procedure: PROSTATE BIOPSY GRAYCE LIGHT;  Surgeon: Kassie Ozell SAUNDERS, MD;  Location: [facility];  Service: Urology;  Laterality: N/A;   RETINAL DETACHMENT SURGERY Left    TRANSURETHRAL RESECTION OF PROSTATE     UPPER EXTREMITY VENOGRAPHY Right 11/20/2022   Procedure: UPPER EXTREMITY VENOGRAPHY;  Surgeon: Marea Selinda RAMAN, MD;  Location: [facility];  Service: Cardiovascular;  Laterality: Right;   UPPER EXTREMITY VENOGRAPHY Right 11/24/2022   Procedure: UPPER EXTREMITY VENOGRAPHY;  Surgeon: Marea Selinda RAMAN, MD;  Location: [facility];  Service: Cardiovascular;  Laterality: Right;   XI ROBOTIC ASSISTED VENTRAL HERNIA N/A 11/23/2021   Procedure: XI ROBOTIC ASSISTED VENTRAL HERNIA, incisional;  Surgeon: Desiderio Schanz, MD;  Location: [facility];  Service: General;  Laterality: N/A;    Home Medications:  Allergies as of 09/04/2024       Reactions   Fish Allergy Swelling   Morphine  Other (See Comments)   About 5-10 minutes after admin of 2 mg morphine  IV, patient became very agitated and tearful, loudly expressing desire to be taken by Jesus, unable to reason with or calm patient   Ciprofloxacin     Unknown reaction   Doxazosin Other (See Comments)   unknown        Medication List        Accurate as of September 04, 2024  3:06 PM. If you have any questions, ask your nurse or doctor.          acetaminophen  500 MG tablet Commonly known as: TYLENOL  Take 1,000 mg by mouth every 8 (eight) hours as needed.   aspirin EC 81 MG tablet Take 81 mg by mouth daily. Swallow whole.   D3 2000 PO Take by mouth daily.   diphenhydrAMINE  25 mg capsule Commonly known as: BENADRYL  Take 25 mg by mouth every 6 (six) hours as needed.   fluticasone  50 MCG/ACT nasal spray Commonly known as: FLONASE  Place 1 spray into both  nostrils daily as needed for allergies or rhinitis.   gabapentin  300 MG capsule Commonly known as: NEURONTIN  Take 300 mg by mouth at bedtime.   losartan 50 MG tablet Commonly known as: COZAAR Take 50 mg by mouth daily.   lovastatin 20 MG tablet Commonly known as: MEVACOR Take 20 mg by mouth at bedtime.   polyethylene glycol 17 g packet Commonly known as: MIRALAX  / GLYCOLAX  Take 17 g by mouth daily as needed.   sodium bicarbonate  325 MG tablet Take 325 mg by mouth 3 (three) times daily.   Voltaren 1 % Gel Generic drug: diclofenac Sodium Apply topically daily as needed.        Allergies: Allergies[1]  Family History: Family History  Problem Relation Age of Onset   Leukemia Father  Leukemia Brother     Social History:  reports that he has never smoked. He has never used smokeless tobacco. He reports that he does not currently use alcohol. He reports that he does not use drugs.  ROS: Pertinent ROS in HPI  Physical Exam: BP (!) 164/76   Pulse 77   Wt 171 lb (77.6 kg)   SpO2 100%   BMI 23.19 kg/m   Constitutional:  Well nourished. Alert and oriented, No acute distress. HEENT: [place] AT, moist mucus membranes.  Trachea midline Cardiovascular: No clubbing, cyanosis, or edema. Respiratory: Normal respiratory effort, no increased work of breathing. GU: No CVA tenderness.  No bladder fullness or masses.  Patient with normal phallus.  Urethral meatus is patent.  No penile discharge. No penile lesions or rashes. Scrotum without lesions, cysts, rashes and/or edema.  Testicles are located scrotally bilaterally. No masses are appreciated in the testicles. Left and right epididymis are normal.  There is a 2 cm x 2 cm, non-tender, circumscribed, rubbery mass in the right spermatic cord.   Neurologic: Grossly intact, no focal deficits, moving all 4 extremities. Psychiatric: Normal mood and affect.  Laboratory Data: See Epic and HPI   I have reviewed the labs.   Pertinent  Imaging:  09/04/24 10:41  Scan Result 6mL    Assessment & Plan:    1. Renal mass -The left renal mass was still indeterminate on the MRI in 2023, but still favored to represent a hemorrhagic/proteinaceous cyst  - MRI ordered for follow up   2. Prostate cancer - PSA pending   3. 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  These notes generated with voice recognition software. I apologize for typographical errors.  CLOTILDA CORNWALL, PA-C  [facility] [address]  [address] [place], [place] [address] [phone]     [1]  Allergies Allergen Reactions   Fish Allergy Swelling   Morphine  Other (See Comments)    About 5-10 minutes after admin of 2 mg morphine  IV, patient became very agitated and tearful, loudly expressing desire to be taken by Jesus, unable to reason with or calm patient   Ciprofloxacin      Unknown reaction   Doxazosin Other (See Comments)    unknown   "

## 2024-09-04 ENCOUNTER — Encounter: Payer: Self-pay | Admitting: Urology

## 2024-09-04 ENCOUNTER — Ambulatory Visit: Admitting: Urology

## 2024-09-04 VITALS — BP 164/76 | HR 77 | Wt 171.0 lb

## 2024-09-04 DIAGNOSIS — C61 Malignant neoplasm of prostate: Secondary | ICD-10-CM | POA: Diagnosis not present

## 2024-09-04 DIAGNOSIS — N5089 Other specified disorders of the male genital organs: Secondary | ICD-10-CM

## 2024-09-04 DIAGNOSIS — N2889 Other specified disorders of kidney and ureter: Secondary | ICD-10-CM

## 2024-09-04 LAB — BLADDER SCAN AMB NON-IMAGING

## 2024-09-05 LAB — PSA: Prostate Specific Ag, Serum: 15.3 ng/mL — ABNORMAL HIGH (ref 0.0–4.0)

## 2024-09-08 ENCOUNTER — Telehealth: Payer: Self-pay

## 2024-09-08 ENCOUNTER — Ambulatory Visit: Payer: Self-pay | Admitting: Urology

## 2024-09-08 DIAGNOSIS — C61 Malignant neoplasm of prostate: Secondary | ICD-10-CM

## 2024-09-08 DIAGNOSIS — R9721 Rising PSA following treatment for malignant neoplasm of prostate: Secondary | ICD-10-CM

## 2024-09-08 NOTE — Telephone Encounter (Signed)
 Called patient to confirm where patient is receiving dialysis. Patient state that he has dialysis in [place] with Dr. Marcelino.  Andrea Kirks LPN

## 2024-09-08 NOTE — Telephone Encounter (Signed)
 Called and left vm message requesting what the address is for the dialysis center in [place] Dr. Marcelino.   Andrea Kirks LPN

## 2024-09-11 ENCOUNTER — Ambulatory Visit
Admission: RE | Admit: 2024-09-11 | Discharge: 2024-09-11 | Disposition: A | Discharge status: Home / Self Care | Source: Ambulatory Visit | Attending: Urology | Admitting: Urology

## 2024-09-11 DIAGNOSIS — N5089 Other specified disorders of the male genital organs: Secondary | ICD-10-CM | POA: Diagnosis present

## 2024-09-16 ENCOUNTER — Ambulatory Visit
Admission: RE | Admit: 2024-09-16 | Discharge: 2024-09-16 | Disposition: A | Discharge status: Home / Self Care | Source: Ambulatory Visit | Attending: Urology | Admitting: Urology

## 2024-09-16 DIAGNOSIS — C61 Malignant neoplasm of prostate: Secondary | ICD-10-CM | POA: Diagnosis present

## 2024-09-16 DIAGNOSIS — R9721 Rising PSA following treatment for malignant neoplasm of prostate: Secondary | ICD-10-CM

## 2024-09-16 MED ORDER — FLOTUFOLASTAT F 18 GALLIUM 296-5846 MBQ/ML IV SOLN
8.0000 | Freq: Once | INTRAVENOUS | Status: AC
  Administered 2024-09-16: 8.23 via INTRAVENOUS
  Filled 2024-09-16: qty 8

## 2024-10-02 ENCOUNTER — Ambulatory Visit: Admitting: Urology

## 2024-11-24 ENCOUNTER — Ambulatory Visit: Admitting: Student in an Organized Health Care Education/Training Program

## 2024-11-25 ENCOUNTER — Ambulatory Visit: Admitting: Student in an Organized Health Care Education/Training Program

## 2025-04-28 ENCOUNTER — Ambulatory Visit (INDEPENDENT_AMBULATORY_CARE_PROVIDER_SITE_OTHER): Admitting: Vascular Surgery

## 2025-04-28 ENCOUNTER — Encounter (INDEPENDENT_AMBULATORY_CARE_PROVIDER_SITE_OTHER)
